# Patient Record
Sex: Female | Born: 1940
Health system: Southern US, Community
[De-identification: ages and names within clinical notes are randomized; demographics above are authoritative.]

## PROBLEM LIST (undated history)

## (undated) DIAGNOSIS — K208 Other esophagitis without bleeding: Secondary | ICD-10-CM

## (undated) DIAGNOSIS — C3490 Malignant neoplasm of unspecified part of unspecified bronchus or lung: Secondary | ICD-10-CM

## (undated) DIAGNOSIS — J449 Chronic obstructive pulmonary disease, unspecified: Secondary | ICD-10-CM

## (undated) DIAGNOSIS — J302 Other seasonal allergic rhinitis: Secondary | ICD-10-CM

## (undated) DIAGNOSIS — I10 Essential (primary) hypertension: Secondary | ICD-10-CM

## (undated) DIAGNOSIS — T66XXXA Radiation sickness, unspecified, initial encounter: Principal | ICD-10-CM

## (undated) DIAGNOSIS — M1990 Unspecified osteoarthritis, unspecified site: Secondary | ICD-10-CM

## (undated) HISTORY — PX: JOINT REPLACEMENT: SHX530

## (undated) HISTORY — PX: ABDOMINAL HYSTERECTOMY: SHX81

## (undated) HISTORY — PX: TOTAL HIP ARTHROPLASTY: SHX124

## (undated) HISTORY — PX: TOTAL KNEE ARTHROPLASTY: SHX125

## (undated) HISTORY — PX: BACK SURGERY: SHX140

## (undated) HISTORY — PX: APPENDECTOMY: SHX54

---

## 1998-12-29 ENCOUNTER — Encounter: Payer: Self-pay | Admitting: Emergency Medicine

## 1998-12-29 ENCOUNTER — Emergency Department (HOSPITAL_COMMUNITY): Admission: EM | Admit: 1998-12-29 | Discharge: 1998-12-29 | Payer: Self-pay | Admitting: Emergency Medicine

## 1999-10-21 ENCOUNTER — Encounter: Payer: Self-pay | Admitting: Family Medicine

## 1999-10-21 ENCOUNTER — Ambulatory Visit (HOSPITAL_COMMUNITY): Admission: RE | Admit: 1999-10-21 | Discharge: 1999-10-21 | Payer: Self-pay | Admitting: Family Medicine

## 1999-10-22 ENCOUNTER — Other Ambulatory Visit: Admission: RE | Admit: 1999-10-22 | Discharge: 1999-10-22 | Payer: Self-pay | Admitting: Family Medicine

## 2001-07-20 ENCOUNTER — Inpatient Hospital Stay (HOSPITAL_COMMUNITY): Admission: EM | Admit: 2001-07-20 | Discharge: 2001-07-22 | Payer: Self-pay

## 2001-07-21 ENCOUNTER — Encounter: Payer: Self-pay | Admitting: Cardiovascular Disease

## 2005-06-08 ENCOUNTER — Other Ambulatory Visit: Admission: RE | Admit: 2005-06-08 | Discharge: 2005-06-08 | Payer: Self-pay | Admitting: Family Medicine

## 2005-09-16 ENCOUNTER — Emergency Department (HOSPITAL_COMMUNITY): Admission: EM | Admit: 2005-09-16 | Discharge: 2005-09-16 | Payer: Self-pay | Admitting: Family Medicine

## 2007-02-02 ENCOUNTER — Encounter: Admission: RE | Admit: 2007-02-02 | Discharge: 2007-02-02 | Payer: Self-pay | Admitting: Orthopedic Surgery

## 2008-01-10 ENCOUNTER — Inpatient Hospital Stay (HOSPITAL_COMMUNITY): Admission: RE | Admit: 2008-01-10 | Discharge: 2008-01-14 | Payer: Self-pay | Admitting: Orthopedic Surgery

## 2009-06-15 IMAGING — CR DG CHEST 2V
2 series · 2 of 2 positions shown · non-contrast
Comparison: None

CLINICAL DATA: Preoperative respiratory exam.  Osteoarthritis of
the right knee

CHEST - 2 VIEW

[view not recorded (1 of 2)]
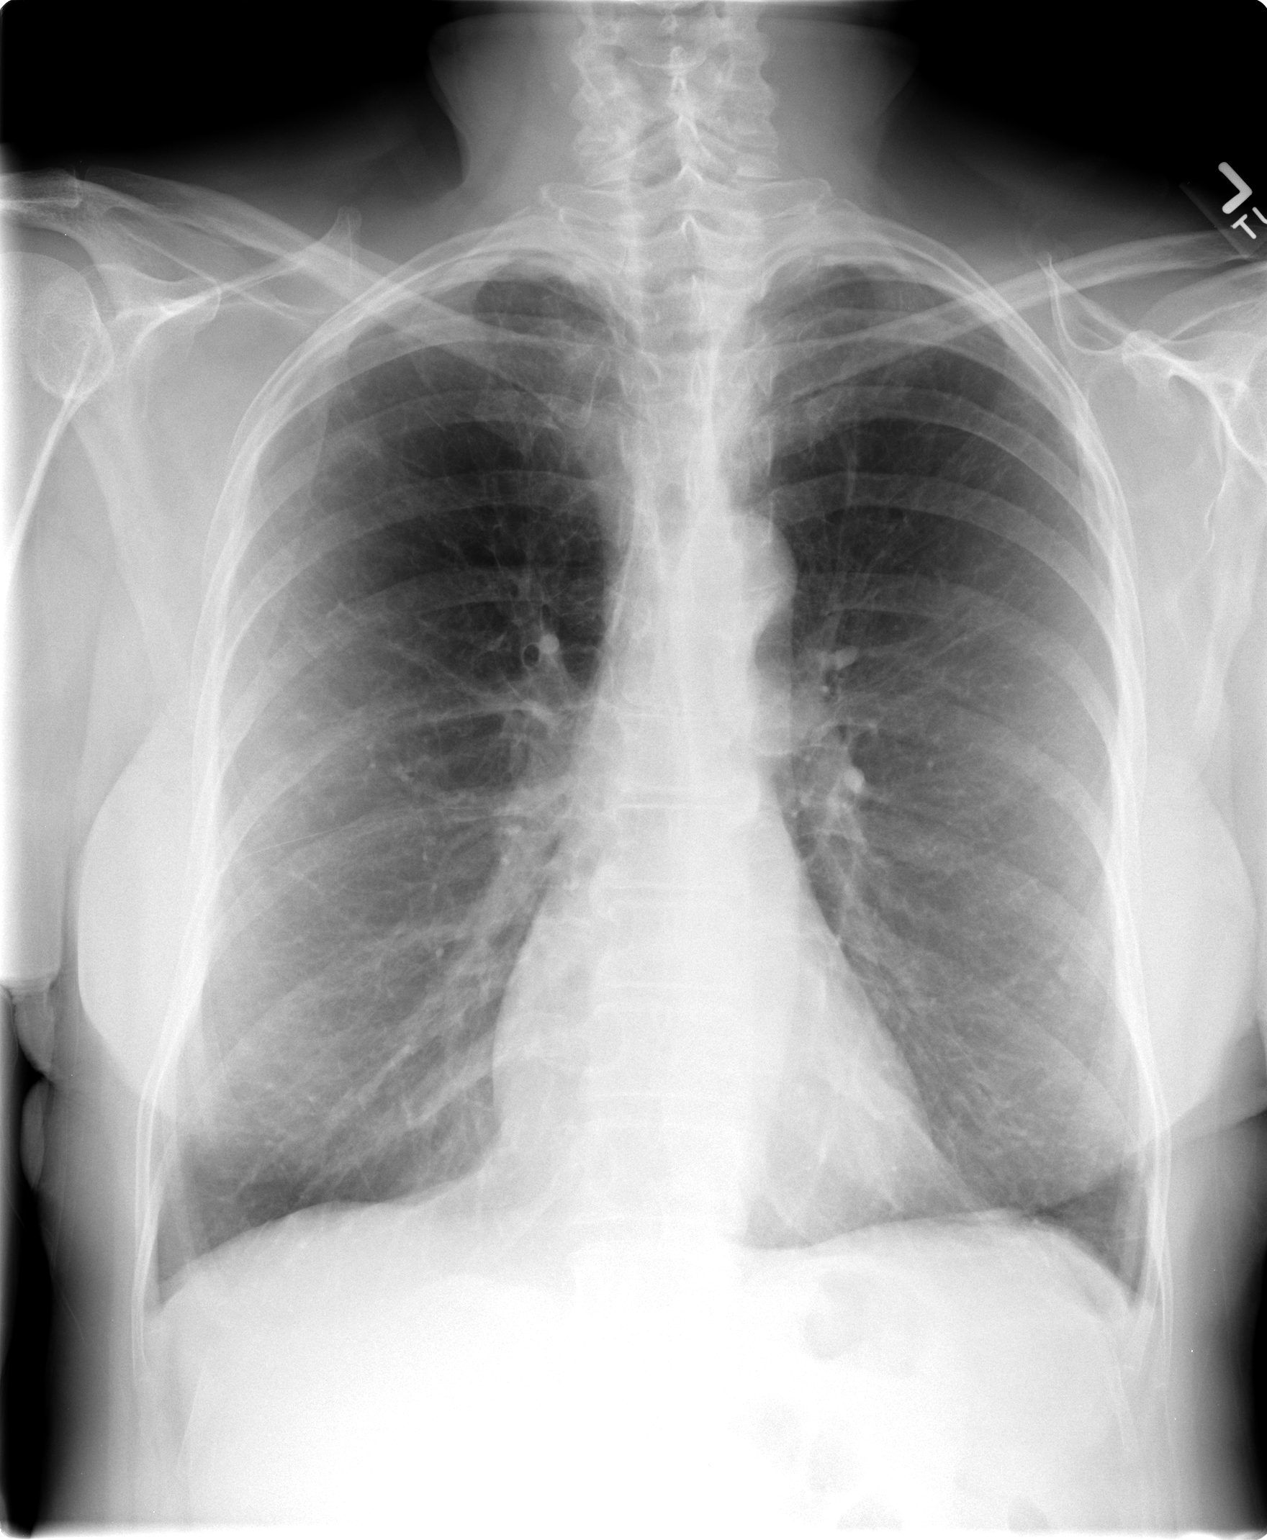

[view not recorded (2 of 2)]
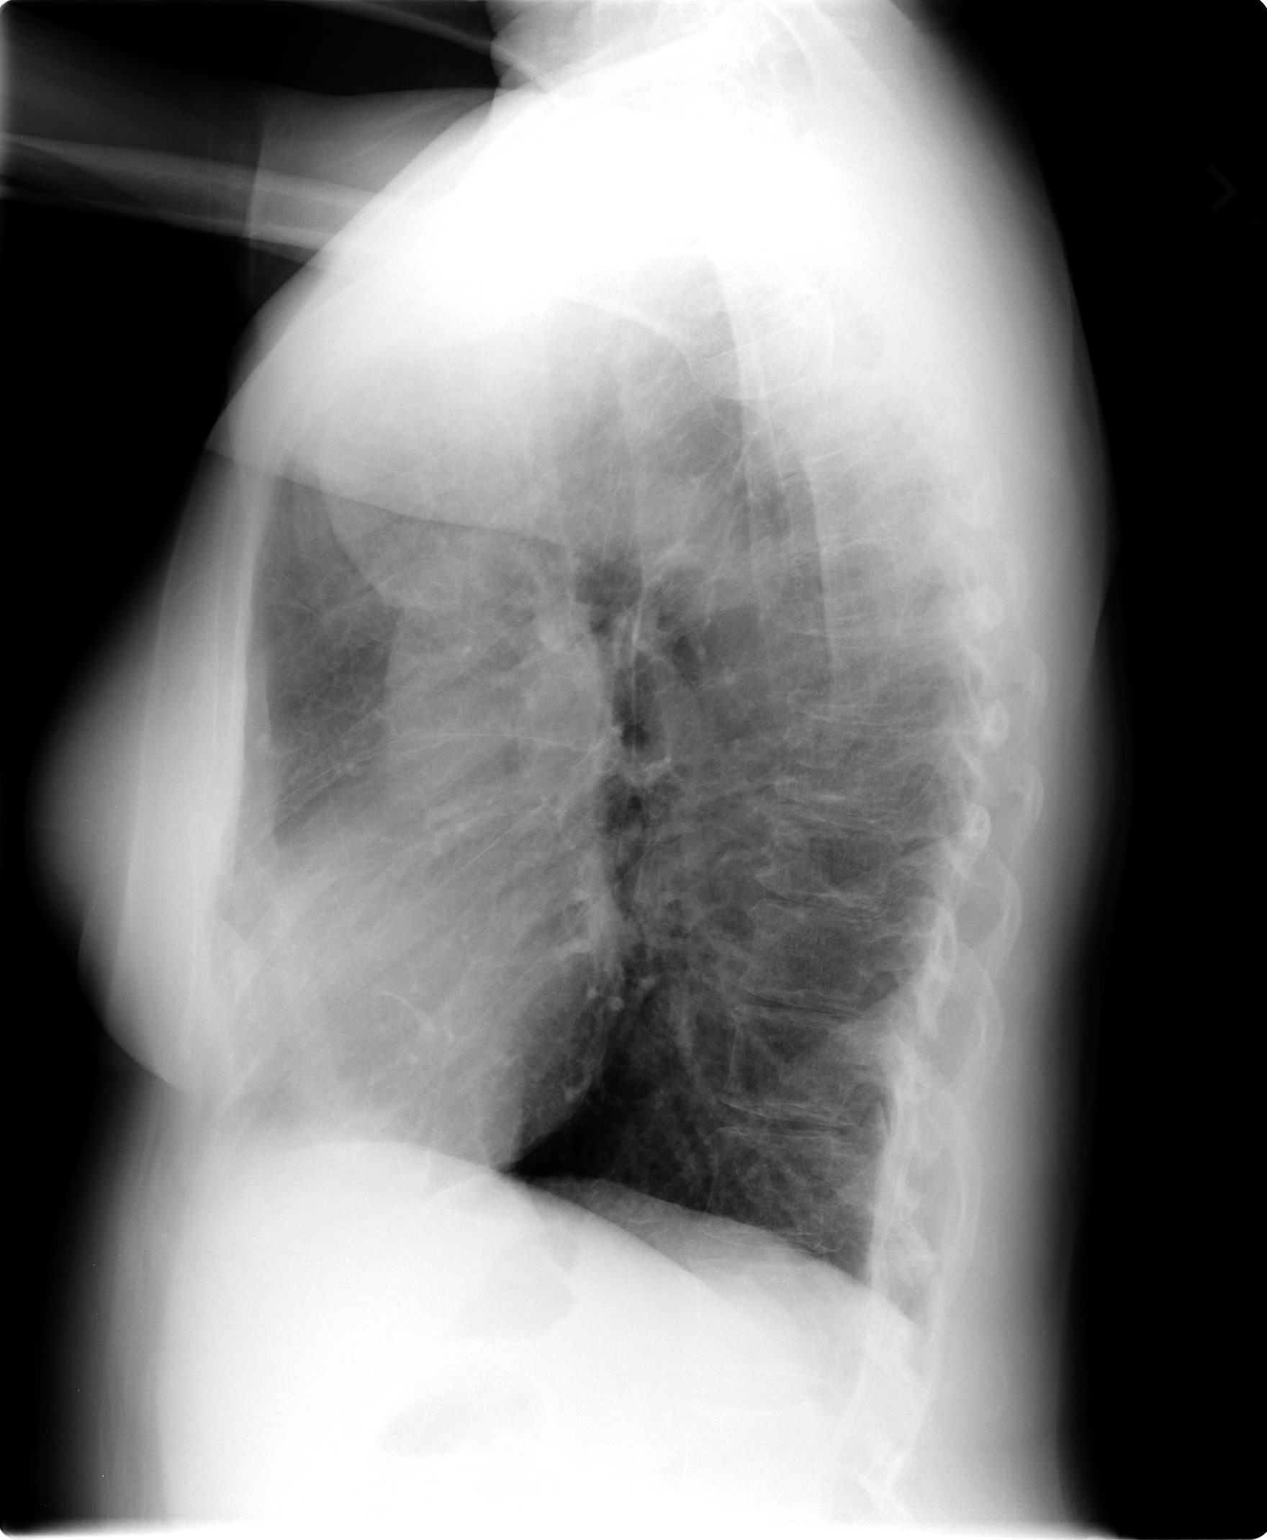

[2 of 2 positions shown; findings below may reference images not displayed]

FINDINGS: The heart size and vascularity are normal and the lungs are clear.
The lungs do appear somewhat hyperinflated.  No significant bony
abnormality.
IMPRESSION: No acute cardiopulmonary disease.

## 2009-06-17 IMAGING — CR DG KNEE 1-2V PORT*R*
2 series · 2 of 2 positions shown · non-contrast
Comparison: None

CLINICAL DATA: Knee replacement

PORTABLE RIGHT KNEE - 1-2 VIEW

[view not recorded (1 of 2)]
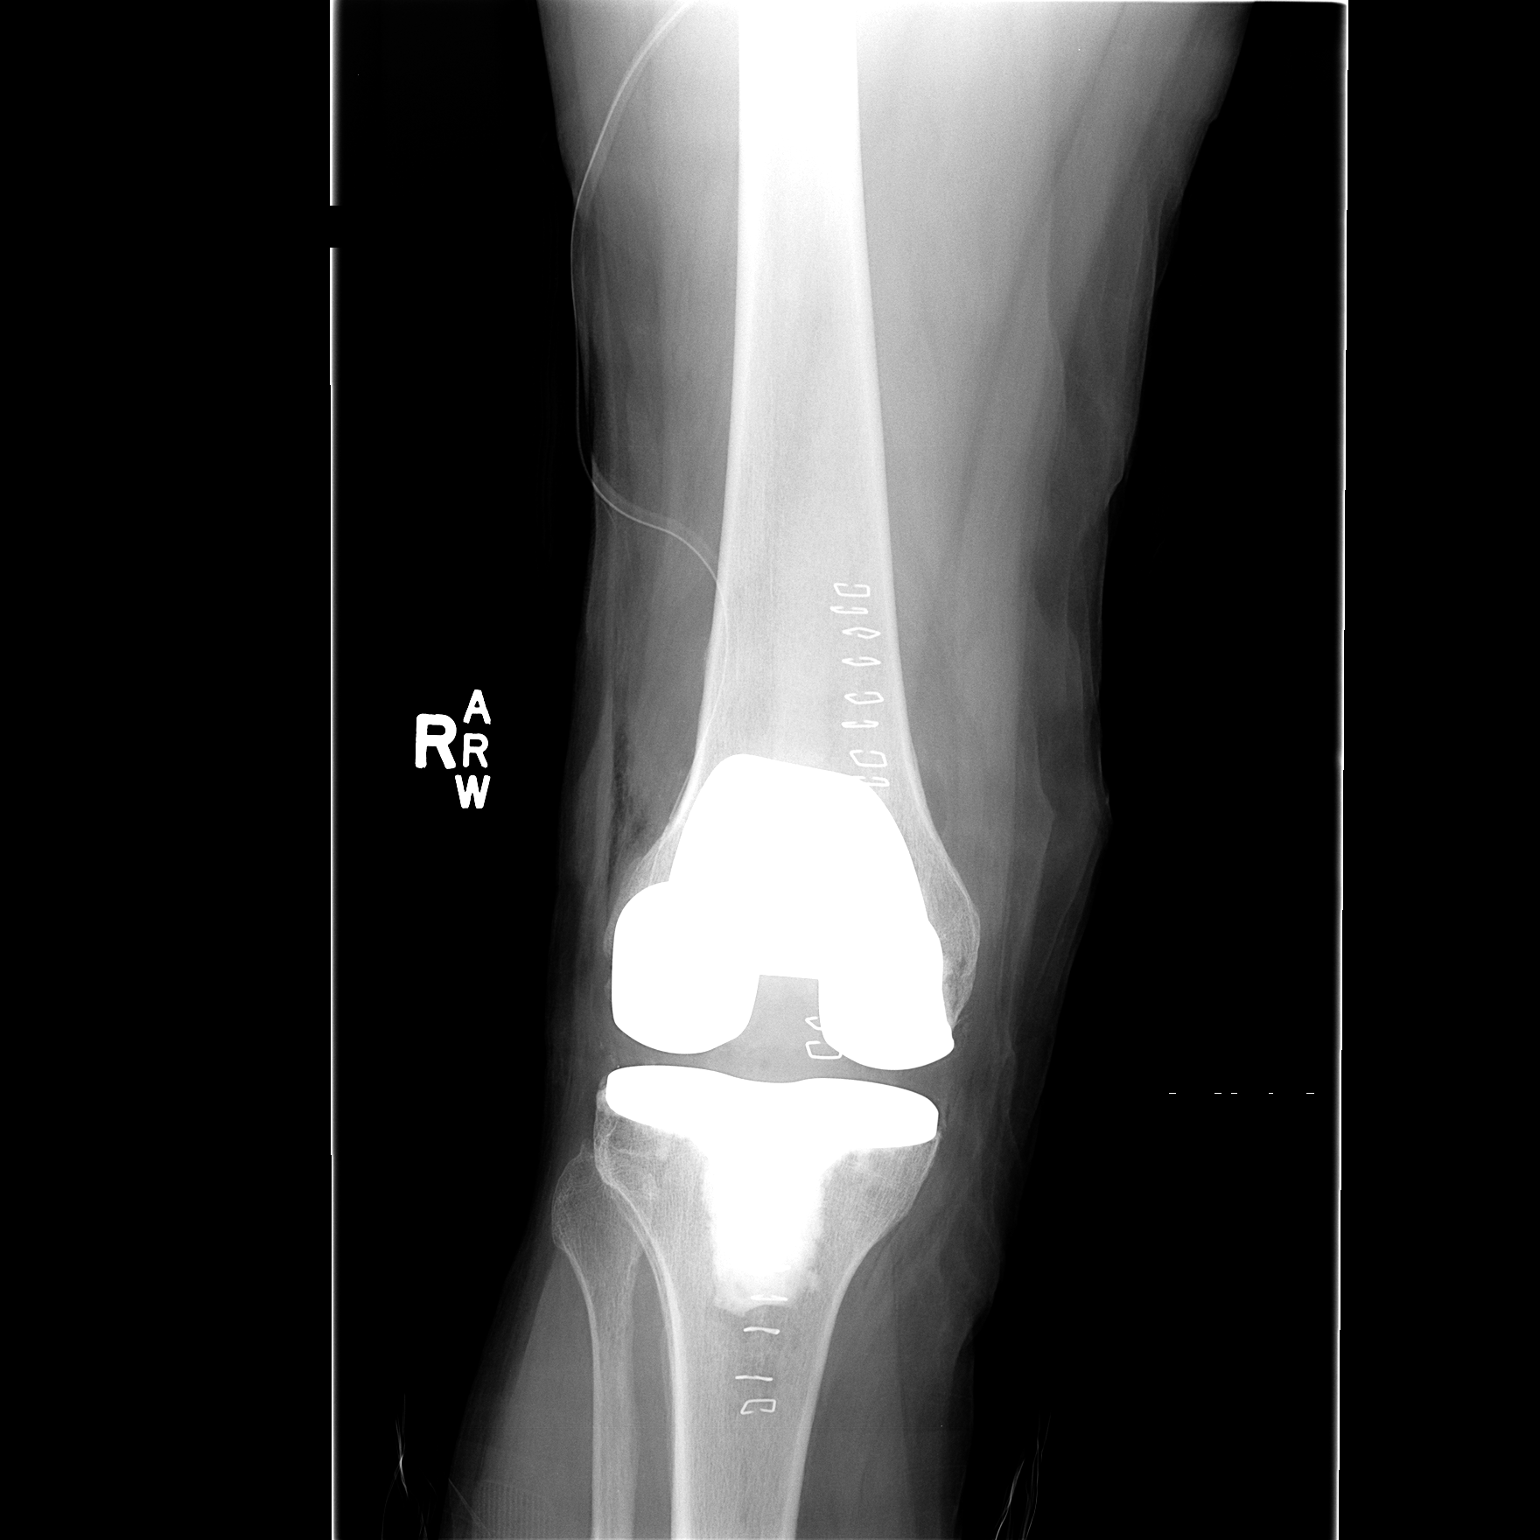

[view not recorded (2 of 2)]
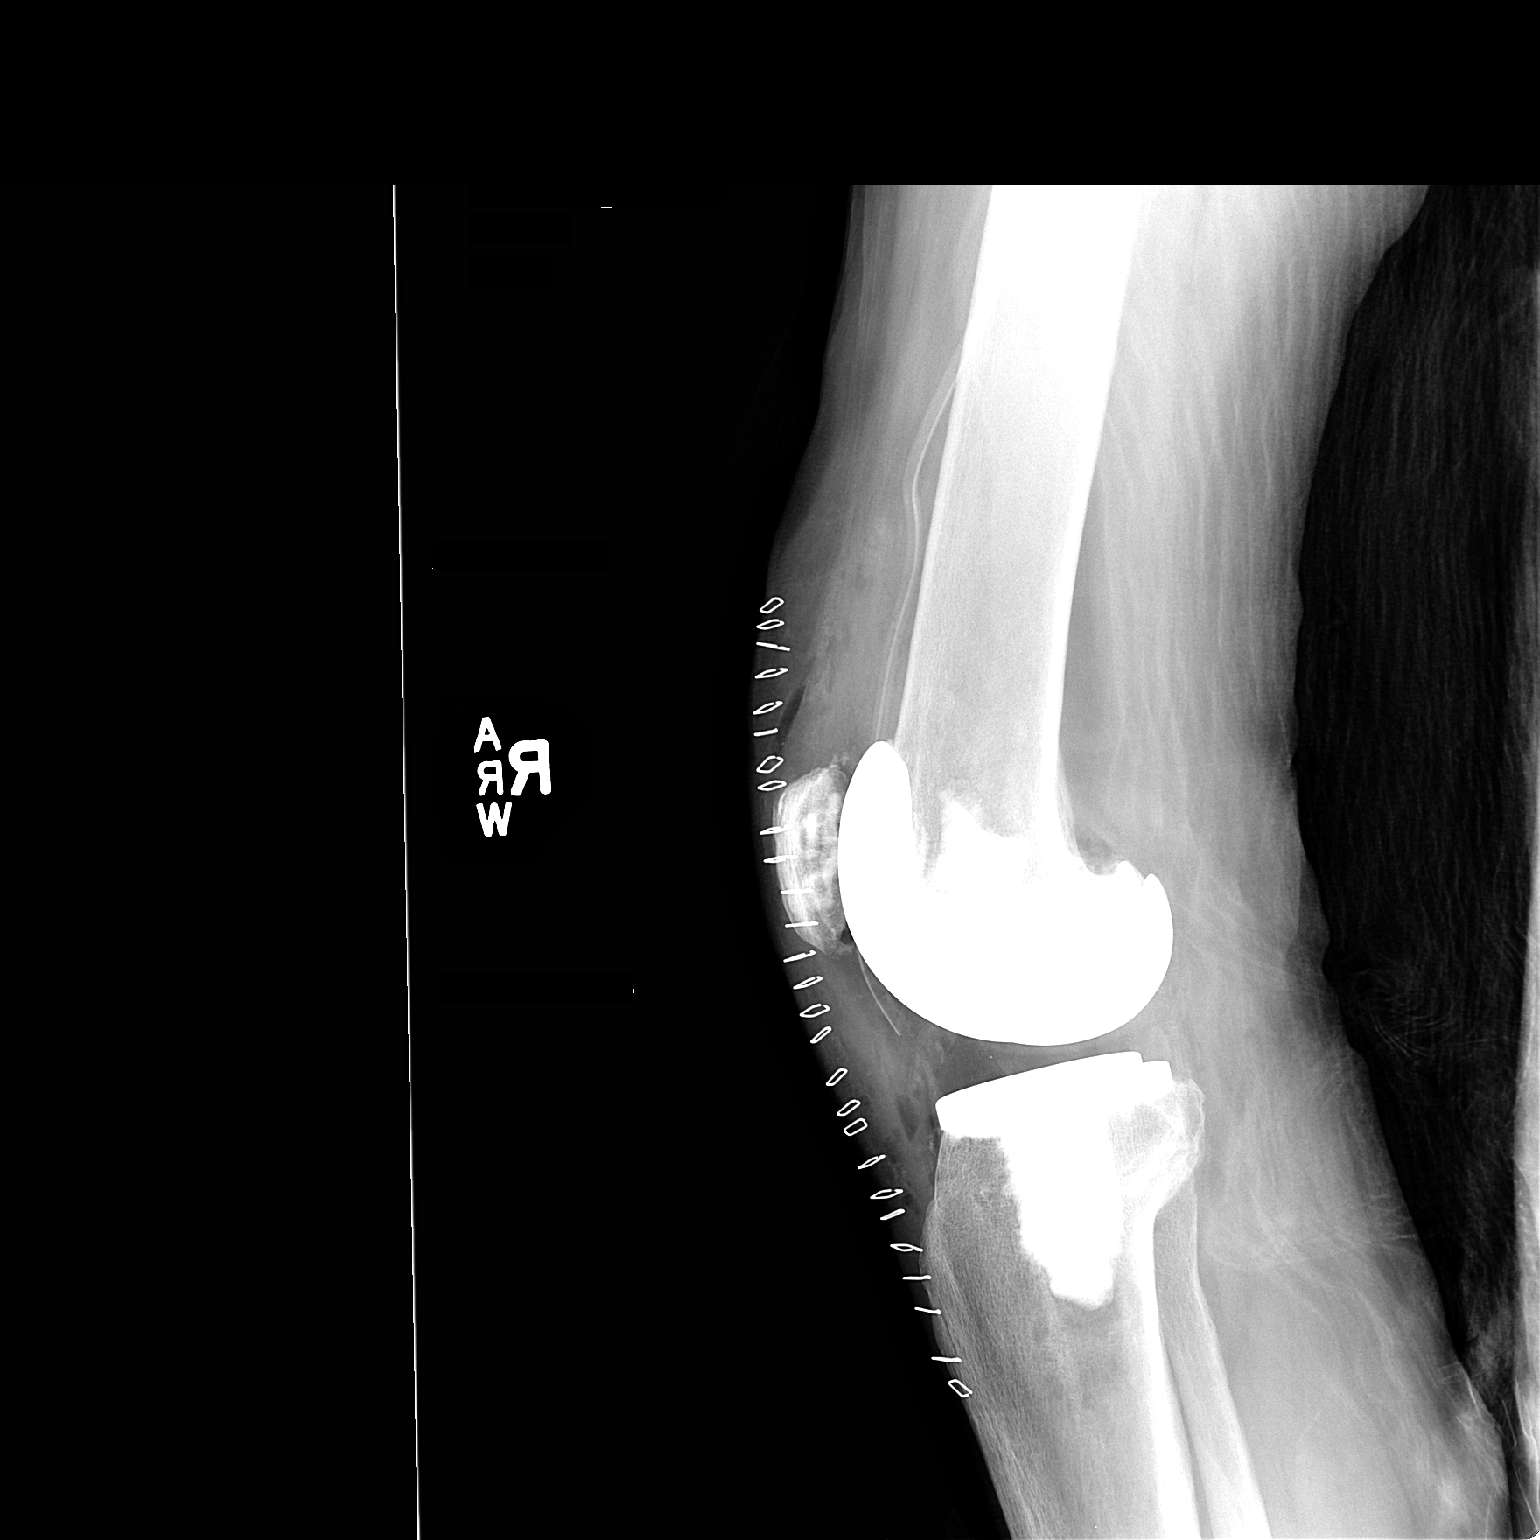

[2 of 2 positions shown; findings below may reference images not displayed]

FINDINGS: There is been a total knee replacement.  The prosthesis
is satisfactory alignment.  There is no fracture or complication.
There is a surgical drain in the soft tissues
IMPRESSION: Satisfactory knee replacement

## 2010-11-24 NOTE — H&P (Signed)
Kristy Salazar, Kristy Salazar                ACCOUNT NO.:  [id number]   MEDICAL RECORD NO.:  [id number]          PATIENT TYPE:  INP   LOCATION:  NA                           FACILITY:  [facility]   PHYSICIAN:  Georges Lynch. Gioffre, M.D.DATE OF BIRTH:  07/01/1941   DATE OF ADMISSION:  01/10/2008  DATE OF DISCHARGE:                              HISTORY & PHYSICAL   CHIEF COMPLAINT:  Painful range of motion right knee.   HISTORY OF PRESENT ILLNESS:  Ms. Kristy Salazar is a 70 year old female with  issues related to right knee pain.  The patient had significant  conservative treatment along with arthroscopic procedures to her right  knee.  She was found to have severe arthritic changes in her knee with  the arthroscopy.  The patient continued to have pain with range of  motion and weightbearing.  The patient has elected to proceed with a  right total knee arthroplasty.   ALLERGIES:  ERYTHROMYCIN.   CURRENT MEDICATIONS:  1. Singulair 1 tablet at night during allergy seasons.  2. Crestor.  3. Chantix.   PAST MEDICAL HISTORY:  1. Asthma with seasonal allergies.  2. Hypercholesterolemia.  3. Tobacco use; currently going through cessation treatment.   PAST SURGICAL HISTORY:  1. Appendectomy in 1960.  2. Back surgery in 1991/06/30.  3. Knee arthroscopy in 06/29/97 and 06/29/2006.  4. A hysterectomy in 1970 requiring a blood transfusion.  (The patient denies any complications with the above-mentioned surgical  procedures.)   REVIEW OF SYSTEMS:  NEUROLOGIC:  Negative for any neurologic issues.  PULMONARY:  She does have occasional seasonal allergies which are  treated with Singulair mostly in the spring and fall.  CARDIOVASCULAR:  Negative for any chest pains, MIs.  She does have some slightly  elevated cholesterol, for which she is taking Crestor.  GI:  Unremarkable.  GU:  Unremarkable.  ENDOCRINE:  Unremarkable.  HEMATOLOGIC:  She did have a blood transfusion with a previous  hysterectomy.   FAMILY MEDICAL HISTORY:   Father is deceased in 06/30/03 at the age of 63  from heart disease.  Mother is deceased in 27 at the age of 84 from  cancer.   SOCIAL HISTORY:  The patient is single.  She works as a Glass blower/designer.  She  smoked about a half a pack a day for the past 30 years.  She has five  grown children.  Her family will care for after the surgery.   PHYSICAL EXAMINATION:  VITAL SIGNS:  Height is 5' 10, weight is 150  pounds.  Blood pressure is 128/68, pulse of 70 and regular, respirations  are 12.  The patient is afebrile.  GENERAL:  This is a slender, tall, healthy-appearing female, conscious,  alert and appropriate.  HEENT:  Head was normocephalic.  Pupils equal, round and reactive.  Gross hearing is intact.  NECK:  Supple.  No palpable lymphadenopathy.  Good range of motion.  CHEST:  Lung sounds were clear and equal throughout.  HEART:  Regular rate and rhythm.  ABDOMEN:  Soft, nontender.  Bowel sounds present.  EXTREMITIES:  Upper extremities had excellent range  of motion of her  shoulders, elbows and wrists.  Lower Extremities - both hips had full  range of motion without any discomfort.  Right knee had painful range of  motion which was about 5 degrees to 120 degrees.  Left knee was full  extension to 130 degrees.  She had no instability about either knee.  No  effusion.  No signs of infection.  Both calves were soft.  Good range of  motion of both ankles.  Peripheral vascular - carotid pulses were 2+,  radial pulses 2+, dorsalis pedis pulses were 1+.  She had no lower  extremity edema, venous stasis change or pigmentation changes.  NEUROLOGIC:  The patient was conscious, alert and appropriate.  Appeared  to be a good historian.  No gross neurologic defects noted.  BREAST/RECTAL/GU:  Deferred at this time.   PRIMARY CARE PHYSICIAN:  [name], M.D.   The patient was cleared through [facility] by Dr.  Lynnea Ferrier.   IMPRESSION:  1. Severe osteoarthritis right knee.  2.  Seasonal allergies with asthma.  3. Hypercholesterolemia.  4. Tobacco use.   PLAN:  The patient will undergo all routine labs and tests prior to  having a right total knee arthroplasty by Dr. Darrelyn Hillock at [facility] on January 10, 2008.      Jamelle Rushing, P.A.    ______________________________  Georges Lynch Darrelyn Hillock, M.D.    RWK/MEDQ  D:  01/09/2008  T:  01/09/2008  Job:  [id number]

## 2010-11-24 NOTE — Op Note (Signed)
Kristy Salazar, Kristy Salazar NO.:  [id number]   MEDICAL RECORD NO.:  [id number]          PATIENT TYPE:  INP   LOCATION:  0006                         FACILITY:  [facility]   PHYSICIAN:  Georges Lynch. Gioffre, M.D.DATE OF BIRTH:  [birth year]   DATE OF PROCEDURE:  01/10/2008  DATE OF DISCHARGE:                               OPERATIVE REPORT   SURGEON:  Georges Lynch. Darrelyn Hillock, M.D.   ASSISTANT:  Jamelle Rushing, P.A.   PREOPERATIVE DIAGNOSIS:  Severe degenerative arthritis of the right  knee.   POSTOPERATIVE DIAGNOSIS:  Severe degenerative arthritis of the right  knee.   OPERATION:  Right total knee arthroplasty utilizing the DePuy system.  I  cemented all three components in simultaneously and utilized a rotating  platform tibial insert.  The size used was the femur was a size 4 right,  posterior cruciate sacrificing.  Tibial insert was a size 4, 10 mm  thickness rotating platform.  The tibial tray was a size 3.  The patella  was a size 38 patella.  We utilized vancomycin in the bone cement.   PROCEDURE:  Under general anesthesia, routine orthopedic prep and  draping of the right lower extremity was carried out.  She had 1 g of IV  Ancef.  At this time, the leg was exsanguinated with an Esmarch.  Tourniquet was elevated at 350 mmHg.  The knee was flexed.  An anterior  incision was made over the anterior aspect of the right knee.  Bleeders  were identified and cauterized.  Two flaps were created.  I then did a  median parapatellar incision, reflected the patella laterally, flexed  the knee, did medial and lateral meniscectomies, and excised the  anterior and posterior cruciate ligaments.  We then made our initial  drill hole in the intercondylar notch, and removed 11 mm thickness off  the distal femur.  We measured the femur to be a size 4 right.  We then  utilized our next jig and did our anterior, posterior, and chamfer cuts  for a size 4 right femur.  Following that, we then went  on and prepared  our tibia in the usual fashion, removed 4 mm thickness off the affected  side of the tibia.  We cut our keel cut out of the tibia.  We then went  on and cut our notch cut out of the femur.  We inserted our spacer  blocks and noted that we were a little tight in extension, so we went  back and made another cut in the distal femur.  We removed a few more  millimeters in the usual fashion.  Once that was done, we had an  excellent fit with our trial component.  We then cut our patella.  We  did a resurfacing procedure.  Three drill holes were made in the  articular surface of the patella.  We removed all trial components,  thoroughly Waterpiked out the knee, dried the knee out, and then  inserted our Gelfoam up into the femoral shaft and down into the tibial  shaft.  The cement then was  inserted.  We added vancomycin to the  cement, and all three components were cemented in simultaneously.  Following that, we then selected a size 4, 10 mm thickness tibial  insert.  We made sure first that all loose pieces of cement were  removed.  We Waterpiked the knee out again and made sure there were no  loose pieces of cement.  We inserted our permanent size 4 tibial insert.  The knee was then reduced, taken through motion.  We had excellent  flexion and extension,  good lateral and medial stability.  The knee then was closed in layers  in the usual fashion over a Hemovac drain.  Note that in doing this,  though, prior to inserting the components, we did inject 30 mL of 0.25%  Marcaine with epinephrine and 30 mg of Toradol.  Sterile Neosporin  dressing was applied.           ______________________________  Georges Lynch Darrelyn Hillock, M.D.     RAG/MEDQ  D:  01/10/2008  T:  01/10/2008  Job:  [id number]   cc:   Ritta Slot, MD  Fax: [phone]

## 2010-11-27 NOTE — Discharge Summary (Signed)
Kristy Salazar, Kristy Salazar NO.:  [id number]   MEDICAL RECORD NO.:  [id number]          PATIENT TYPE:  INP   LOCATION:  1610                         FACILITY:  [facility]   PHYSICIAN:  Georges Lynch. Gioffre, M.D.DATE OF BIRTH:  December 24, 1940   DATE OF ADMISSION:  01/10/2008  DATE OF DISCHARGE:  01/14/2008                               DISCHARGE SUMMARY   ADMISSION DIAGNOSES:  1. Severe osteoarthritis right knee.  2. Seasonal allergies, with asthma.  3. Hypercholesterolemia.  4. Tobacco use.   DISCHARGE DIAGNOSES:  1. Right total knee arthroplasty.  2. Postoperative blood loss anemia, requiring 2 units of packed red      blood cells, with no untoward events.  3. Seasonal allergies, with asthma.  4. Hypercholesterolemia.  5. Tobacco use.   HISTORY OF PRESENT ILLNESS:  The patient is a 70 year old female with  pain and difficulty with range of motion to her right knee.  She has  failed conservative treatment, including arthroscopic procedures,  medications, and injections.  The patient has elected to proceed with  total knee arthroplasty.  The patient's arthroscopic picture shows  significant arthritic changes deep within the knee.   ALLERGIES:  ERYTHROMYCIN.   CURRENT MEDICATIONS:  1. Singulair 1 tablet at night in allergy season.  2. Crestor  3. Chantix.   SURGICAL PROCEDURE:  On January 10, 2008, the patient was taken to the O.R.  by Dr. Ranee Gosselin, assisted by Arlyn Leak, PA-C, and under general  anesthesia underwent a right total knee arthroplasty with a __________  without any complications.  The patient tolerated the procedure well.  There was minimal blood loss.  The patient had the following components  implanted:  A size 4 right femoral Sigma component, a size 3 keel tibial  tray, a size 4, 10 mm thickness polyethylene bearing, a signs 38, 3 peg  patella.  All components were implanted following methacrylate with  vancomycin.   CONSULTS:  The following  routine consults were requested:  physical  therapy, case management, and pharmacy.   HOSPITAL COURSE:  On January 10, 2008, the patient was admitted to [facility] under the care of Dr. Ranee Gosselin.  The patient was  taken the O.R. and had a right total knee arthroplasty without  complications.  The patient was transferred to the recovery room with IV  antibiotics, pain medicines, and DVT prophylaxis.  The patient was  started on the total knee protocol.  Postoperatively, the patient did  develop some postoperative blood loss anemia.  Hemoglobin did drop to  8.6.  Orthopedic coverage recommended blood transfusions.  The patient  received 2 units of packed red blood cells.  The patient had a little  bit of a temperature after completing it, but no other signs of  reaction.  The patient had no other complicating events.  The patient's  hemoglobin improved to 11.  The patient's wound remained benign, without  any signs of infection.  The patient was able to transition off of IV  medications to p.o. medications well.  The patient worked well with  physical therapy.  It was felt on postop day #4 that the patient was  orthopedically and medically stable and ready for discharge home.  Arrangements were made for her to do outpatient physical therapy with  home health R.N. and physical therapist.  The patient was discharged in  good condition.   LABORATORIES:  CBC on admission found WBC 6.4, hemoglobin 14.6,  hematocrit 42.4, platelets 325.  Second postop day, hemoglobin dropped  to 8.6, with a hematocrit of 24.5.  She was typed, crossed, and  transfused 2 units of packed red blood cells, with improvement of her  hemoglobin and hematocrit to 11.2 and 32.5.  The patient's INR on  discharge was 2.1, with pharmacy managing her Coumadin.  The patient's  chemistries on admission found sodium of 143, potassium of 4.8, dropped  to 3.9 during her hospitalization, glucose 110, elevated to 128,  with no  change to medications, BUN was 4, with a creatinine of 0.7 on discharge.  Estimated GFR was greater than 60.  The patient received a total of 2  units of packed red blood cells.   DISCHARGE INSTRUCTIONS:   DIET:  No restrictions.   ACTIVITY:  The patient is to increase activity and walk with the  assistance of a walker.   WOUND CARE:  The patient should have her wound check daily.  She is to  keep it clean and dry.  Change her dressing daily.   FOLLOWUP:  With Dr. Darrelyn Hillock in 2 weeks from date of surgery.  The  patient to call [phone] for that follow-up appointment.  Home health physical therapy through [place], and to manage Coumadin.   MEDICATIONS:  1. Percocet 10/650, 1 q.4-6 h. for pain if needed.  2. Robaxin 500 mg 1 tablet q.6 h. for muscle spasms if needed.  3. Coumadin 5 mg once a day, unless changed by [facility] pharmacist.  4. Chantix 0.5 mg, once in the morning and once in the evening.  5. Crestor 10 mg a day.  6. Metformin 2 tablets every evening.   CONDITION UPON DISCHARGE HOME:  Improved and good.      Jamelle Rushing, P.A.    ______________________________  Georges Lynch Darrelyn Hillock, M.D.    RWK/MEDQ  D:  01/31/2008  T:  01/31/2008  Job:  [id number]

## 2010-11-27 NOTE — Discharge Summary (Signed)
[place]  Patient:    Kristy Salazar, Kristy Salazar Visit Number: [id number] MRN: [id number]          Service Type: MED Location: [phone] Attending Physician:  Beverely Low Dictated by:   Geraldo Pitter, M.D. Admit Date:  07/20/2001 Discharge Date: 07/22/2001                             Discharge Summary  HISTORY OF PRESENT ILLNESS:  The patient is a 70 year old female who presented with chest pain.  She was seen in the emergency room by Dr. Wallace Cullens.  Her EKG was normal sinus rhythm.  Her CPK was 218, MB of 2.3, ______ 0.1, troponin-I 1 and 0.01.  Her CPK was somewhat elevated, but no ______.  Throughout the hospitalization for a cardiac evaluation as appropriate.  She was admitted to the hospital.  We did get her a consultation by cardiology, who saw the patient, and they thought that she had chest pain, but that we should rule out myocardial infarction.  She was found to have a right carotid bruit by Dr. Ermalinda Memos also on examination.  A 2-D echocardiogram was done which showed overall left ventricular systolic function was normal, left ventricular ejection fraction was estimated as being between 50 to 55%, left ventricular wall thickness was in the upper limits of normal.  Aortic valve thickness was mildly increased.  Mitral valve was thickened, the anterior more than the posterior leaflets.  There was trivial mitral valve regurgitation.  Left ventricular size was normal.  Tricuspid valve was normal.  Overall systolic function was normal.  The aortic valve thickness was deemed mild.  Her cardiac enzymes decreased down to a CPK of 132, and CK-MB of 1.2, CPK of 127, and CK-MB of 1.3.  She continue to do well.  It should be noted that she did also have increased ______ 205.  She was felt not to have any cardiac problems, and was felt really to have more of a costochondritis type of event.  It was felt that she had reached maximal benefit from this  hospitalization, and discharge was instituted.  She was discharged from the hospital ______ chest pain, atypical probable costochondritis with mild elevation of right carotid bruit which will be followed.  She will be started on an aspirin for that.  DISCHARGE MEDICATIONS:  Anti-inflammatory medications.  FOLLOWUP:  She will be seen in the office in a one week period of time.Dictated by:   Geraldo Pitter, M.D. Attending Physician:  Beverely Low DD:  08/31/01 TD:  09/01/01 Job: [id number]

## 2011-04-08 LAB — URINALYSIS, ROUTINE W REFLEX MICROSCOPIC
Glucose, UA: NEGATIVE
Glucose, UA: NEGATIVE
Hgb urine dipstick: NEGATIVE
Ketones, ur: NEGATIVE
Ketones, ur: NEGATIVE
Leukocytes, UA: NEGATIVE
Protein, ur: NEGATIVE
pH: 6
pH: 8

## 2011-04-08 LAB — DIFFERENTIAL
Basophils Absolute: 0
Basophils Relative: 1
Lymphocytes Relative: 45
Neutro Abs: 3
Neutrophils Relative %: 47

## 2011-04-08 LAB — TRANSFUSION REACTION: DAT C3: NEGATIVE

## 2011-04-08 LAB — CBC
HCT: 42.4
Hemoglobin: 14.6
MCV: 92.4
Platelets: 325
Platelets: 196
RDW: 12.7
WBC: 6.4
WBC: 11.3 — ABNORMAL HIGH

## 2011-04-08 LAB — BASIC METABOLIC PANEL
BUN: 4 — ABNORMAL LOW
Creatinine, Ser: 0.77
GFR calc non Af Amer: >60
Potassium: 3.9

## 2011-04-08 LAB — COMPREHENSIVE METABOLIC PANEL
Alkaline Phosphatase: 58
BUN: 7
Chloride: 105
Creatinine, Ser: 0.84
Glucose, Bld: 110 — ABNORMAL HIGH
Potassium: 4.8
Total Bilirubin: 0.5

## 2011-04-08 LAB — CROSSMATCH

## 2011-04-08 LAB — PROTIME-INR
INR: 0.9
INR: 1
Prothrombin Time: 12.3
Prothrombin Time: 13.7
Prothrombin Time: 24.2 — ABNORMAL HIGH
Prothrombin Time: 24.5 — ABNORMAL HIGH
Prothrombin Time: 28 — ABNORMAL HIGH

## 2011-04-08 LAB — HEMOGLOBIN AND HEMATOCRIT, BLOOD
HCT: 24.5 — ABNORMAL LOW
Hemoglobin: 8.6 — ABNORMAL LOW

## 2011-04-08 LAB — APTT
aPTT: 43 — ABNORMAL HIGH
aPTT: 68 — ABNORMAL HIGH

## 2011-04-08 LAB — TYPE AND SCREEN: ABO/RH(D): A POS

## 2011-05-10 ENCOUNTER — Other Ambulatory Visit: Payer: Self-pay | Admitting: Orthopedic Surgery

## 2011-05-10 ENCOUNTER — Other Ambulatory Visit (HOSPITAL_COMMUNITY): Payer: Self-pay | Admitting: Orthopedic Surgery

## 2011-05-10 ENCOUNTER — Ambulatory Visit (HOSPITAL_COMMUNITY)
Admission: RE | Admit: 2011-05-10 | Discharge: 2011-05-10 | Disposition: A | Discharge status: Home / Self Care | Payer: BC Managed Care – PPO | Source: Ambulatory Visit | Attending: Orthopedic Surgery | Admitting: Orthopedic Surgery

## 2011-05-10 ENCOUNTER — Encounter (HOSPITAL_COMMUNITY): Payer: BC Managed Care – PPO

## 2011-05-10 DIAGNOSIS — Z01818 Encounter for other preprocedural examination: Secondary | ICD-10-CM

## 2011-05-10 DIAGNOSIS — I10 Essential (primary) hypertension: Secondary | ICD-10-CM | POA: Insufficient documentation

## 2011-05-10 DIAGNOSIS — Z01812 Encounter for preprocedural laboratory examination: Secondary | ICD-10-CM | POA: Insufficient documentation

## 2011-05-10 LAB — URINALYSIS, ROUTINE W REFLEX MICROSCOPIC
Bilirubin Urine: NEGATIVE
Leukocytes, UA: NEGATIVE
Nitrite: NEGATIVE
Specific Gravity, Urine: 1.008 (ref 1.005–1.030)
Urobilinogen, UA: 0.2 mg/dL (ref 0.0–1.0)
pH: 7.5 (ref 5.0–8.0)

## 2011-05-10 LAB — CBC
HCT: 40.6 % (ref 36.0–46.0)
Hemoglobin: 14 g/dL (ref 12.0–15.0)
MCV: 92.1 fL (ref 78.0–100.0)
RBC: 4.41 MIL/uL (ref 3.87–5.11)
RDW: 12.4 % (ref 11.5–15.5)
WBC: 5.2 10*3/uL (ref 4.0–10.5)

## 2011-05-10 LAB — APTT: aPTT: 41 seconds — ABNORMAL HIGH (ref 24–37)

## 2011-05-10 LAB — COMPREHENSIVE METABOLIC PANEL
ALT: 22 U/L (ref 0–35)
AST: 18 U/L (ref 0–37)
CO2: 24 mEq/L (ref 19–32)
Chloride: 103 mEq/L (ref 96–112)
Creatinine, Ser: 0.63 mg/dL (ref 0.50–1.10)
GFR calc Af Amer: >90 mL/min (ref >90)
GFR calc non Af Amer: >90 mL/min (ref >90)
Glucose, Bld: 110 mg/dL — ABNORMAL HIGH (ref 70–99)
Total Bilirubin: 0.2 mg/dL — ABNORMAL LOW (ref 0.3–1.2)

## 2011-05-10 LAB — SURGICAL PCR SCREEN: Staphylococcus aureus: NEGATIVE

## 2011-05-10 LAB — DIFFERENTIAL
Basophils Absolute: 0 10*3/uL (ref 0.0–0.1)
Lymphocytes Relative: 49 % — ABNORMAL HIGH (ref 12–46)
Lymphs Abs: 2.5 10*3/uL (ref 0.7–4.0)
Neutro Abs: 2.3 10*3/uL (ref 1.7–7.7)
Neutrophils Relative %: 44 % (ref 43–77)

## 2011-05-10 LAB — URINE MICROSCOPIC-ADD ON

## 2011-05-10 LAB — PROTIME-INR: INR: 0.94 (ref 0.00–1.49)

## 2011-05-11 NOTE — H&P (Signed)
Kristy Salazar, Kristy Salazar NO.:  [id number]  MEDICAL RECORD NO.:  [id number]  LOCATION:  1S                           FACILITY:  [facility]  PHYSICIAN:  Georges Lynch. Spiros Greenfeld, M.D.DATE OF BIRTH:  [birth year]  DATE OF ADMISSION:  04/13/2011 DATE OF DISCHARGE:                             HISTORY & PHYSICAL   CHIEF COMPLAINT:  Left hip pain.  HISTORY OF PRESENT ILLNESS:  The patient is a 70 year old female with worsening hip pain secondary to end-stage osteoarthritis on the left side.  The patient had a left total hip arthroplasty by Dr. Ranee Gosselin to decrease pain and increase function.  PAST MEDICAL HISTORY:  Hypertension and diabetes.  FAMILY MEDICAL HISTORY:  Coronary artery disease and hypertension.  SOCIAL HISTORY:  The patient of Dr. [name].  Smokes half pack a day and does not smoke.  DRUG ALLERGY:  To ERYTHROMYCIN.  CURRENT MEDICATIONS:  Metformin, Niaspan, Crestor, Exforge, aspirin, and Chantix with unknown dosages.  REVIEW OF SYSTEMS:  Shows pain on range of motion of the left hip, pain with ambulation.  Otherwise review of systems negative.  PHYSICAL EXAM:  VITAL SIGNS:  Pulse 72, respirations 16, blood pressure 132/76. GENERAL:  The patient is a healthy-appearing 70 year old female, in no acute distress.  Pleasant mood and affect.  Alert and oriented x3. HEENT:  Examination of head and neck shows cranial nerves II through XII grossly intact. NECK:  She has full range of motion and tenderness of the cervical spine. CHEST:  Active breath sounds bilaterally.  No wheezes, rhonchi, or rales. HEART:  Regular rate and rhythm.  No murmur. ABDOMEN:  Nontender, nondistended, with active bowel sounds. EXTREMITIES:  Moderate tenderness with internal and external rotation. Some moderate stiffness of the left hip.  Neurovascularly she is intact distally.  No pedal edema, no rashes, and neurovascularly she is intact. Her bilateral upper and lower  extremities, and deep tendon reflexes are 2+ and equal bilaterally.  X-RAYS:  Show end-stage osteoarthritis, left hip.  IMPRESSION:  End-stage osteoarthritis, left hip.  PLAN OF ACTION:  Left total hip arthroplasty by Dr. Ranee Gosselin.     Thomas B. Dixon, P.A.   ______________________________ Georges Lynch Darrelyn Hillock, M.D.    TBD/MEDQ  D:  05/05/2011  T:  05/05/2011  Job:  [id number]  Electronically Signed by Standley Dakins P.A. on 05/11/2011 11:40:58 AM Electronically Signed by Ranee Gosselin M.D. on 05/11/2011 01:22:35 PM

## 2011-05-13 ENCOUNTER — Inpatient Hospital Stay (HOSPITAL_COMMUNITY)
Admission: RE | Admit: 2011-05-13 | Discharge: 2011-05-17 | DRG: 818 | Disposition: A | Discharge status: Home Health | Payer: BC Managed Care – PPO | Source: Ambulatory Visit | Attending: Orthopedic Surgery | Admitting: Orthopedic Surgery

## 2011-05-13 ENCOUNTER — Inpatient Hospital Stay (HOSPITAL_COMMUNITY): Payer: BC Managed Care – PPO

## 2011-05-13 DIAGNOSIS — M169 Osteoarthritis of hip, unspecified: Principal | ICD-10-CM | POA: Diagnosis present

## 2011-05-13 DIAGNOSIS — M1610 Unilateral primary osteoarthritis, unspecified hip: Principal | ICD-10-CM | POA: Diagnosis present

## 2011-05-13 DIAGNOSIS — E119 Type 2 diabetes mellitus without complications: Secondary | ICD-10-CM | POA: Diagnosis present

## 2011-05-13 DIAGNOSIS — I10 Essential (primary) hypertension: Secondary | ICD-10-CM | POA: Diagnosis present

## 2011-05-13 LAB — GLUCOSE, CAPILLARY
Glucose-Capillary: 110 mg/dL — ABNORMAL HIGH (ref 70–99)
Glucose-Capillary: 129 mg/dL — ABNORMAL HIGH (ref 70–99)

## 2011-05-13 LAB — TYPE AND SCREEN

## 2011-05-14 LAB — GLUCOSE, CAPILLARY
Glucose-Capillary: 98 mg/dL (ref 70–99)
Glucose-Capillary: 110 mg/dL — ABNORMAL HIGH (ref 70–99)
Glucose-Capillary: 129 mg/dL — ABNORMAL HIGH (ref 70–99)

## 2011-05-14 LAB — PROTIME-INR
INR: 1.02 (ref 0.00–1.49)
Prothrombin Time: 13.6 s (ref 11.6–15.2)

## 2011-05-14 LAB — HEMOGLOBIN AND HEMATOCRIT, BLOOD
HCT: 30 % — ABNORMAL LOW (ref 36.0–46.0)
Hemoglobin: 10.6 g/dL — ABNORMAL LOW (ref 12.0–15.0)

## 2011-05-14 MED ORDER — OXYCODONE-ACETAMINOPHEN 5-325 MG PO TABS
1.0000 | ORAL_TABLET | ORAL | Status: DC | PRN
  Administered 2011-05-16: 2 via ORAL

## 2011-05-14 MED ORDER — MONTELUKAST SODIUM 10 MG PO TABS
10.0000 mg | ORAL_TABLET | Freq: Every day | ORAL | Status: DC | PRN
  Filled 2011-05-14: qty 1

## 2011-05-14 MED ORDER — METHOCARBAMOL 500 MG PO TABS: 500.0000 mg | ORAL_TABLET | Freq: Four times a day (QID) | ORAL | Status: DC | PRN

## 2011-05-14 MED ORDER — AMLODIPINE BESYLATE 5 MG PO TABS
5.0000 mg | ORAL_TABLET | Freq: Every day | ORAL | Status: DC
  Filled 2011-05-14 (×3): qty 1

## 2011-05-14 MED ORDER — WARFARIN VIDEO: Freq: Once | Status: DC

## 2011-05-14 MED ORDER — SODIUM CHLORIDE 0.9 % IJ SOLN: 3.0000 mL | Freq: Two times a day (BID) | INTRAMUSCULAR | Status: DC

## 2011-05-14 MED ORDER — MENTHOL 3 MG MT LOZG
1.0000 | LOZENGE | OROMUCOSAL | Status: DC | PRN
  Filled 2011-05-14: qty 9

## 2011-05-14 MED ORDER — DOCUSATE SODIUM 100 MG PO CAPS
100.0000 mg | ORAL_CAPSULE | Freq: Two times a day (BID) | ORAL | Status: DC
  Administered 2011-05-16 – 2011-05-17 (×2): 100 mg via ORAL
  Filled 2011-05-14 (×7): qty 1

## 2011-05-14 MED ORDER — CHLORHEXIDINE GLUCONATE 0.12 % MT SOLN
15.0000 mL | Freq: Two times a day (BID) | OROMUCOSAL | Status: DC
  Administered 2011-05-16 – 2011-05-17 (×3): 15 mL via OROMUCOSAL
  Filled 2011-05-14 (×7): qty 15

## 2011-05-14 MED ORDER — BISACODYL 5 MG PO TBEC: 10.0000 mg | DELAYED_RELEASE_TABLET | Freq: Every day | ORAL | Status: DC | PRN

## 2011-05-14 MED ORDER — COUMADIN BOOK: Freq: Once | Status: DC

## 2011-05-14 MED ORDER — ONDANSETRON HCL 4 MG/2ML IJ SOLN: 4.0000 mg | Freq: Four times a day (QID) | INTRAMUSCULAR | Status: DC | PRN

## 2011-05-14 MED ORDER — SODIUM CHLORIDE 0.9 % IV BOLUS (SEPSIS): 250.0000 mL | INTRAVENOUS | Status: DC | PRN

## 2011-05-14 MED ORDER — ALUM & MAG HYDROXIDE-SIMETH 200-200-20 MG/5ML PO SUSP: 30.0000 mL | ORAL | Status: DC | PRN

## 2011-05-14 MED ORDER — ROSUVASTATIN CALCIUM 10 MG PO TABS
10.0000 mg | ORAL_TABLET | Freq: Every day | ORAL | Status: DC
  Administered 2011-05-16 – 2011-05-17 (×2): 10 mg via ORAL
  Filled 2011-05-14 (×4): qty 1

## 2011-05-14 MED ORDER — ONDANSETRON HCL 4 MG PO TABS
4.0000 mg | ORAL_TABLET | Freq: Four times a day (QID) | ORAL | Status: DC | PRN
Start: 2011-05-14 — End: 2011-05-17

## 2011-05-14 MED ORDER — FERROUS SULFATE 325 (65 FE) MG PO TABS
325.0000 mg | ORAL_TABLET | Freq: Three times a day (TID) | ORAL | Status: DC
  Administered 2011-05-16 – 2011-05-17 (×4): 325 mg via ORAL
  Filled 2011-05-14 (×10): qty 1

## 2011-05-14 MED ORDER — ACETAMINOPHEN 650 MG RE SUPP: 325.0000 mg | RECTAL | Status: DC | PRN

## 2011-05-14 MED ORDER — ACETAMINOPHEN 325 MG PO TABS
325.0000 mg | ORAL_TABLET | ORAL | Status: DC | PRN
  Administered 2011-05-16 (×2): 325 mg via ORAL
  Administered 2011-05-16: 650 mg via ORAL
  Administered 2011-05-16: 325 mg via ORAL
  Administered 2011-05-17 (×2): 650 mg via ORAL

## 2011-05-14 MED ORDER — METFORMIN HCL 500 MG PO TABS
500.0000 mg | ORAL_TABLET | Freq: Every day | ORAL | Status: DC
  Administered 2011-05-16: 500 mg via ORAL
  Filled 2011-05-14 (×4): qty 1

## 2011-05-14 MED ORDER — METHOCARBAMOL 100 MG/ML IJ SOLN
500.0000 mg | Freq: Four times a day (QID) | INTRAVENOUS | Status: DC | PRN
  Filled 2011-05-14: qty 5

## 2011-05-14 MED ORDER — OLMESARTAN MEDOXOMIL 20 MG PO TABS
20.0000 mg | ORAL_TABLET | Freq: Every day | ORAL | Status: DC
  Administered 2011-05-17: 20 mg via ORAL
  Filled 2011-05-14 (×4): qty 1

## 2011-05-14 MED ORDER — NIACIN ER 500 MG PO CPCR
500.0000 mg | ORAL_CAPSULE | Freq: Every day | ORAL | Status: DC
  Administered 2011-05-16: 500 mg via ORAL
  Filled 2011-05-14 (×4): qty 1

## 2011-05-14 MED ORDER — HEPARIN SODIUM (PORCINE) 5000 UNIT/ML IJ SOLN
5000.0000 [IU] | Freq: Three times a day (TID) | INTRAMUSCULAR | Status: DC
  Filled 2011-05-14 (×9): qty 1

## 2011-05-14 MED ORDER — PHENOL 1.4 % MT LIQD: 1.0000 | OROMUCOSAL | Status: DC | PRN

## 2011-05-14 MED ORDER — BISACODYL 10 MG RE SUPP: 10.0000 mg | Freq: Every day | RECTAL | Status: DC | PRN

## 2011-05-14 MED ORDER — FLEET ENEMA 7-19 GM/118ML RE ENEM: 1.0000 | ENEMA | Freq: Every day | RECTAL | Status: DC | PRN

## 2011-05-14 MED ORDER — VARENICLINE TARTRATE 1 MG PO TABS
1.0000 mg | ORAL_TABLET | Freq: Every day | ORAL | Status: DC
  Administered 2011-05-16: 1 mg via ORAL
  Filled 2011-05-14 (×5): qty 1

## 2011-05-14 MED ORDER — BIOTENE DRY MOUTH MT LIQD
15.0000 mL | Freq: Two times a day (BID) | OROMUCOSAL | Status: DC
  Administered 2011-05-16 (×2): 15 mL via OROMUCOSAL
  Filled 2011-05-14 (×7): qty 15

## 2011-05-14 MED ORDER — ALUMINUM HYDROXIDE GEL 320 MG/5ML PO SUSP
15.0000 mL | ORAL | Status: DC | PRN
  Filled 2011-05-14: qty 30

## 2011-05-15 LAB — PROTIME-INR
INR: 1.55 — ABNORMAL HIGH (ref 0.00–1.49)
Prothrombin Time: 18.9 seconds — ABNORMAL HIGH (ref 11.6–15.2)

## 2011-05-15 LAB — GLUCOSE, CAPILLARY
Glucose-Capillary: 117 mg/dL — ABNORMAL HIGH (ref 70–99)
Glucose-Capillary: 117 mg/dL — ABNORMAL HIGH (ref 70–99)

## 2011-05-15 LAB — HEMOGLOBIN AND HEMATOCRIT, BLOOD: Hemoglobin: 9.7 g/dL — ABNORMAL LOW (ref 12.0–15.0)

## 2011-05-15 MED ORDER — INSULIN ASPART 100 UNIT/ML ~~LOC~~ SOLN
0.0000 [IU] | Freq: Three times a day (TID) | SUBCUTANEOUS | Status: DC
  Administered 2011-05-16: 2 [IU] via SUBCUTANEOUS
  Filled 2011-05-15: qty 3

## 2011-05-15 MED ORDER — HEPARIN SODIUM (PORCINE) 5000 UNIT/ML IJ SOLN
5000.0000 [IU] | Freq: Two times a day (BID) | INTRAMUSCULAR | Status: DC
  Administered 2011-05-16 (×3): 5000 [IU] via SUBCUTANEOUS
  Filled 2011-05-15 (×5): qty 1

## 2011-05-15 NOTE — Op Note (Signed)
NAMELUZCLARITA, BLILEY NO.:  [id number]  MEDICAL RECORD NO.:  [id number]  LOCATION:  1538                         FACILITY:  [facility]  PHYSICIAN:  Georges Lynch. Shaarav Ripple, M.D.DATE OF BIRTH:  04-15-1941  DATE OF PROCEDURE:  05/13/2011 DATE OF DISCHARGE:                              OPERATIVE REPORT   SURGEON:  Georges Lynch. Darrelyn Hillock, M.D.  ASSISTANT:  Madlyn Frankel. Charlann Boxer, M.D.  OPERATION:  Left total hip arthroplasty utilizing the DePuy system.  The sizes used were as follows:  I utilized a porous-coated 54 mm acetabular Pinnacle.  I used the Ultrex polyethylene liner +4 neutral 36 mm inside diameter.  I used 1 cancellous screw 30 mm in length.  I used a hole eliminator in the metal cup.  The femoral stem was a size 5 high offset. The head was a ceramic head 1.5 mm in length, 36 mm diameter.  DESCRIPTION OF PROCEDURE:  Under general anesthesia, routine orthopedic prep and draping left lower extremity carried out.  A posterolateral approach to hip was carried out.  Great care was taken not to injure the underlying sciatic nerve.  Following that, I went down and incised the iliotibial band.  Dr. Charlann Boxer helped with retraction.  We then went down identified the capsule and incised the capsule and later did a capsulectomy.  Dr. Charlann Boxer then helped dislocate the femoral head and held the retractors in place and I made the appropriate soft cut to remove the femoral head.  Once this was done, I then utilized the box osteotome followed by the widening reamer followed by the canal finder.  I thoroughly irrigated out the femoral shaft several times.  I then rasped the shaft up to a size 5 Tri-Lock stem.  Following that, we then continued to prepare the acetabulum.  Dr. Charlann Boxer from his side completed the capsulectomy.  We then reamed the acetabulum up to a size 53 mm for a 54 mm cup.  Dr.  Charlann Boxer also assisted in the reaming and also in holding retractors and also helped control the bleeding.  At  this time, we then inserted our permanent Pinnacle cup, size 54 mm.  We then made the appropriate drill hole through the acetabular cup and inserted our 30 mm screw.  We did check make sure that the screw did not penetrate the bone.  Following that, we irrigated out the area and then inserted our hole eliminator into the acetabular cup.  We then inserted our Ultrex liner.  Following that, Dr. Charlann Boxer from his side then inserted the Tri- Lock stem.  He had a nice secure fit.  Following that, we then went through various leg length measurements and selected a 1.5 mm length femoral head, which was the ceramic head.  We then reduced the hip, took the hip through motion.  Before inserting our permanent ceramic head, we went through trials, checked her leg length several times, and checked for stability.  We did utilize the Tri-Lock stem, which was a high offset size 5.  Once the hip was reduced,we had stable construct.  We took the hip through range of motion.  Thoroughly irrigated out the area reapproximated the hip and  soft tissue structures in usual fashion. Once again great care was taken not to injure the underlying sciatic nerve.  Sterile dressings were applied.  The patient left the operative room in satisfactory condition.          ______________________________ Georges Lynch Darrelyn Hillock, M.D.     RAG/MEDQ  D:  05/13/2011  T:  05/14/2011  Job:  [id number]  cc:   [name], M.D. Fax: [phone]  Pamella Pert, MD Fax: [phone]  Electronically Signed by Ranee Gosselin M.D. on 05/15/2011 08:22:45 AM

## 2011-05-16 DIAGNOSIS — M1610 Unilateral primary osteoarthritis, unspecified hip: Secondary | ICD-10-CM | POA: Diagnosis present

## 2011-05-16 LAB — GLUCOSE, CAPILLARY
Glucose-Capillary: 108 mg/dL — ABNORMAL HIGH (ref 70–99)
Glucose-Capillary: 97 mg/dL (ref 70–99)
Glucose-Capillary: 114 mg/dL — ABNORMAL HIGH (ref 70–99)

## 2011-05-16 LAB — PROTIME-INR: INR: 1.77 — ABNORMAL HIGH (ref 0.00–1.49)

## 2011-05-16 MED ORDER — OXYCODONE HCL 5 MG PO TABS
ORAL_TABLET | ORAL | Status: AC
  Filled 2011-05-16: qty 1

## 2011-05-16 MED ORDER — ROXICET 5-325 MG PO TABS: 1.0000 | ORAL_TABLET | ORAL | Status: AC | PRN

## 2011-05-16 MED ORDER — ACETAMINOPHEN 325 MG PO TABS
ORAL_TABLET | ORAL | Status: AC
  Administered 2011-05-16: 650 mg via ORAL
  Filled 2011-05-16: qty 2

## 2011-05-16 MED ORDER — ACETAMINOPHEN 325 MG PO TABS
ORAL_TABLET | ORAL | Status: AC
  Administered 2011-05-16: 325 mg via ORAL
  Filled 2011-05-16: qty 2

## 2011-05-16 MED ORDER — OXYCODONE-ACETAMINOPHEN 5-325 MG PO TABS
ORAL_TABLET | ORAL | Status: AC
  Filled 2011-05-16: qty 2

## 2011-05-16 MED ORDER — WARFARIN SODIUM 5 MG PO TABS: 5.0000 mg | ORAL_TABLET | Freq: Once | ORAL | Status: DC

## 2011-05-16 MED ORDER — WARFARIN SODIUM 5 MG PO TABS
5.0000 mg | ORAL_TABLET | Freq: Once | ORAL | Status: AC
  Administered 2011-05-16: 5 mg via ORAL
  Filled 2011-05-16: qty 1

## 2011-05-16 MED ORDER — ACETAMINOPHEN 325 MG PO TABS
ORAL_TABLET | ORAL | Status: AC
  Filled 2011-05-16: qty 2

## 2011-05-16 MED ORDER — FERROUS SULFATE 325 (65 FE) MG PO TABS: 325.0000 mg | ORAL_TABLET | Freq: Three times a day (TID) | ORAL | Status: DC

## 2011-05-16 MED ORDER — SODIUM CHLORIDE 0.9 % IV SOLN
INTRAVENOUS | Status: DC
  Administered 2011-05-16 (×2): via INTRAVENOUS

## 2011-05-16 NOTE — Progress Notes (Signed)
  ANTICOAGULATION CONSULT NOTE - Follow Up Consult  Pharmacy Consult for Coumadin Indication: VTE prophylaxis  Allergies  Allergen Reactions  . Erythromycin Hives    Patient Measurements: Height: 5\' 10"  (177.8 cm) (entered during cutover) Weight: 165 lb 2 oz (74.9 kg) (entered during cutover) IBW/kg (Calculated) : 68.5   Vital Signs: Temp: 98.3 F (36.8 C) (11/04 0604) Temp src: Oral (11/04 0604) BP: 134/61 mmHg (11/04 0526) Pulse Rate: 88  (11/04 0604)  Labs:  Basename 05/16/11 0447 05/15/11 1620 05/15/11 0431 05/14/11 1102 05/14/11 0425  HGB -- 9.7* -- 10.6* --  HCT -- 28.1* -- 30.0* --  PLT -- -- -- -- --  APTT -- -- -- -- --  LABPROT 20.9* -- 18.9* -- 13.6  INR 1.77* -- 1.55* -- 1.02  HEPARINUNFRC -- -- -- -- --  CREATININE -- -- -- -- --  CKTOTAL -- -- -- -- --  CKMB -- -- -- -- --  TROPONINI -- -- -- -- --   Estimated Creatinine Clearance: 72.8 ml/min (by C-G formula based on Cr of 0.63).   Medications:  Scheduled:    . amLODipine  5 mg Oral Daily  . antiseptic oral rinse  15 mL Mouth Rinse q12n4p  . chlorhexidine  15 mL Mouth/Throat BID  . docusate sodium  100 mg Oral BID  . ferrous sulfate  325 mg Oral TID WC  . heparin subcutaneous  5,000 Units Subcutaneous Q12H  . insulin aspart  0-15 Units Subcutaneous TID WC  . metFORMIN  500 mg Oral QHS  . niacin  500 mg Oral QHS  . olmesartan  20 mg Oral Daily  . rosuvastatin  10 mg Oral Daily  . sodium chloride  3 mL Intravenous Q12H  . varenicline  1 mg Oral Daily  . DISCONTD: heparin subcutaneous  5,000 Units Subcutaneous Q8H  . DISCONTD: oxyCODONE-acetaminophen       PRN: acetaminophen, acetaminophen, alum & mag hydroxide-simeth, aluminum hydroxide, bisacodyl, bisacodyl, menthol-cetylpyridinium, methocarbamol(ROBAXIN) IV, methocarbamol, montelukast, ondansetron (ZOFRAN) IV, ondansetron, oxyCODONE-acetaminophen, phenol, sodium chloride, sodium phosphate  Assessment: INR rising nicely towards goal.  No  bleeding reported. Tolerating diet okay.   Goal of Therapy:  INR 2-3   Plan:  Repeat Coumadin 5mg  today. Continue heparin sq. Probable discharge tomorrow.  Reece Packer 05/16/2011,9:15 AM

## 2011-05-16 NOTE — Progress Notes (Signed)
Kristy Salazar  MRN: [id number] DOB/Age: 68/28/1942 68 y.o. Physician: Georges Lynch. [name] Procedure:   @RRDAYPOSTSURGERY @  BP is Low,will DC BP meds,and increase Iv Fluids  Vital Signs Temp:  [98.3 F (36.8 C)-100.2 F (37.9 C)] 98.5 F (36.9 C) (11/04 0940) Pulse Rate:  [88-98] 92  (11/04 0940) Resp:  [18] 18  (11/04 0940) BP: (78-134)/(44-61) 82/48 mmHg (11/04 0941) SpO2:  [95 %-99 %] 99 % (11/04 0940)  Lab Results  Basename 05/15/11 1620 05/14/11 1102  WBC -- --  HGB 9.7* 10.6*  HCT 28.1* 30.0*  PLT -- --   BMET No results found for this basename: NA:2,K:2,CL:2,CO2:2,GLUCOSE:2,BUN:2,CREATININE:2,CALCIUM:2 in the last 72 hours CBG: NA INR  Date Value Range Status  05/16/2011 1.77* 0.00-1.49 (no units) Final    Exam  Hip Status: Well-Located Extremities:Calf tenderness:none Pulses: Intact Neurologic:Dorsiflexion Intact Incision/Wound: Normal   Anticoagulant: Coumadin  Hemovac: None  Post-Op X-Ray: normal  Transfusion: None  Plan Pt to be discharged Possibly Tomorrow Return to clinic for follow-up: 2-weeks  after Surgery  [name] 05/16/2011, 10:02 AM

## 2011-05-16 NOTE — Patient Instructions (Signed)
Precautions for total hip replacement; bed mobility and use of walker.

## 2011-05-16 NOTE — Progress Notes (Signed)
Physical Therapy Evaluation Patient Details Name: Kristy Salazar MRN: [id number] DOB: February 20, 1941 Today's Date: 05/16/2011  Problem List: There is no problem list on file for this patient.   Past Medical History: No past medical history on file. Past Surgical History: No past surgical history on file.  PT Assessment/Plan/Recommendation  Please refer to initial evaluation in hard copy form.  Evaluation was completed prior to going live with epic. PT Goals  Acute Rehab PT Goals PT Goal Formulation: With patient/family Time For Goal Achievement: 4 days Pt will go Supine/Side to Sit: with modified independence Pt will Transfer Sit to Stand/Stand to Sit: with modified independence Pt will Ambulate: >150 feet;with modified independence;with rolling walker Pt will Go Up / Down Stairs: 3-5 stairs;with supervision;with rail(s) Additional Goals Additional Goal #1: Pt will be able to verbalize 2/3 hip precautions  PT Evaluation Precautions/Restrictions  Precautions Precautions: Posterior Hip Required Braces or Orthoses: Yes Prior Functioning   I see eval Cognition  see eval Sensation/Coordination  see eval Extremity Assessment   Mobility (including Balance) Bed Mobility Bed Mobility: Yes Supine to Sit: 4: Min assist Sitting - Scoot to Edge of Bed: 4: Min assist Transfers Transfers: Yes Sit to Stand: 4: Min assist Ambulation/Gait Ambulation/Gait: Yes Ambulation/Gait Assistance: 5: Supervision Ambulation/Gait Assistance Details (indicate cue type and reason): verbal cuing needed to keep patient looking up. Ambulation Distance (Feet): 30 Feet Assistive device: Rolling walker Gait Pattern: Step-to pattern Gait velocity: slow Stairs: No Wheelchair Mobility Wheelchair Mobility: No  Balance Balance Assessed: No Exercise  Total Joint Exercises Ankle Circles/Pumps: AROM;Strengthening;Both;10 reps;Supine Quad Sets: AROM;Strengthening;Both;10 reps;Supine Gluteal Sets:  AROM;Strengthening;Both;10 reps Short Arc Quad: AROM;Strengthening;Left;10 reps;Supine Heel Slides: AROM;Strengthening;Left;10 reps;Supine Hip ABduction/ADduction: AROM;Strengthening;Left;10 reps;Supine End of Session PT - End of Session Equipment Utilized During Treatment: Gait belt Activity Tolerance: Patient tolerated treatment well Patient left: in chair;with call bell in reach;with family/visitor present General Behavior During Session: [facility] for tasks performed Cognition: [facility] for tasks performed  RUSSELL,CINDY 05/16/2011, 9:27 AM

## 2011-05-16 NOTE — Discharge Summary (Signed)
Physician Discharge Summary  Patient ID: Kristy Salazar MRN: [id number] DOB/AGE: 1941/03/01 69 y.o.  Admit date: 05/13/2011  Discharge date: 05/17/2011  Admission Diagnoses: OA LEFT HIP  Discharge Diagnoses: Same  Discharged Condition: stable  Hospital Course:  I reviewed vitals, labs, x-rays, and performed physical exam daily.  There were no complicating factors post-operatively.   [name] was stable  Consults: none  Significant Diagnostic Studies: radiology: X-Ray: Post op Hip xray  Operative Procedure:  Left Total Hip.  Date of Surgical Procedure: Nov.1,2012  Disposition: Plan is home DC   Current Discharge Medication List    START taking these medications   Details  ferrous sulfate 325 (65 FE) MG tablet Take 1 tablet (325 mg total) by mouth 3 (three) times daily with meals. Qty: 30 tablet, Refills: 0    ROXICET 5-325 MG per tablet Take 1 tablet by mouth every 4 (four) hours as needed for pain. Qty: 30 tablet, Refills: 0    warfarin (COUMADIN) 5 MG tablet Take 1 tablet (5 mg total) by mouth one time only at 6 PM. Qty: 36 tablet, Refills: 1      CONTINUE these medications which have NOT CHANGED   Details  metFORMIN (GLUCOPHAGE) 500 MG tablet Take 500 mg by mouth at bedtime.      montelukast (SINGULAIR) 10 MG tablet Take 10 mg by mouth daily as needed. For seasonal allergies     niacin (NIASPAN) 500 MG CR tablet Take 500 mg by mouth at bedtime.      rosuvastatin (CRESTOR) 10 MG tablet Take 10 mg by mouth daily.      varenicline (CHANTIX PAK) 0.5 MG X 11 & 1 MG X 42 tablet Take 1 mg by mouth 2 (two) times daily. Take one 0.5mg  tablet by mouth once daily for 3 days, then increase to one 0.5mg  tablet twice daily for 4 days, then increase to one 1mg  tablet twice daily for 7 days       STOP taking these medications     amLODipine-valsartan (EXFORGE) 5-160 MG per tablet      aspirin EC 81 MG tablet        Follow-up Information    Follow up with  Kristy Salazar. Make an appointment in 2 weeks. (Call Sooner if any Problems)    Contact information:   [facility] [address] [place] [address] [phone]          Discharge Instructions: See me in 1.5 weeks.Change dressings daily. May shower. Call me if any problems.  Signed: [name] 05/17/2011, 7:28 AM

## 2011-05-17 LAB — HEMOGLOBIN AND HEMATOCRIT, BLOOD
HCT: 23.7 % — ABNORMAL LOW (ref 36.0–46.0)
Hemoglobin: 8.1 g/dL — ABNORMAL LOW (ref 12.0–15.0)

## 2011-05-17 MED ORDER — WARFARIN SODIUM 5 MG PO TABS
5.0000 mg | ORAL_TABLET | Freq: Once | ORAL | Status: DC
  Filled 2011-05-17: qty 1

## 2011-05-17 NOTE — Plan of Care (Signed)
Problem: Consults Goal: Diagnosis- Total Joint Replacement Outcome: Completed/Met Date Met:  05/17/11 Primary Total Hip  Problem: Discharge Progression Outcomes Goal: Barriers To Progression Addressed/Resolved Outcome: Progressing Pts bp low and monitoring bp. Changing pts home bp meds.

## 2011-05-17 NOTE — Progress Notes (Signed)
ANTICOAGULATION CONSULT NOTE - Follow Up Consult  Pharmacy Consult for Warfarin Indication: VTE prophylaxis s/p THA  Allergies  Allergen Reactions  . Erythromycin Hives    Patient Measurements: Height: 5\' 10"  (177.8 cm) (entered during cutover) Weight: 165 lb 2 oz (74.9 kg) (entered during cutover) IBW/kg (Calculated) : 68.5    Vital Signs: Temp: 99 F (37.2 C) (11/05 0624) Temp src: Oral (11/05 0624) BP: 103/68 mmHg (11/05 0624) Pulse Rate: 88  (11/05 0624)  Labs:  Basename 05/17/11 0355 05/16/11 0447 05/15/11 1620 05/15/11 0431 05/14/11 1102  HGB 8.1* -- 9.7* -- --  HCT 23.7* -- 28.1* -- 30.0*  PLT -- -- -- -- --  APTT -- -- -- -- --  LABPROT 21.5* 20.9* -- 18.9* --  INR 1.83* 1.77* -- 1.55* --  HEPARINUNFRC -- -- -- -- --  CREATININE -- -- -- -- --  CKTOTAL -- -- -- -- --  CKMB -- -- -- -- --  TROPONINI -- -- -- -- --   Estimated Creatinine Clearance: 72.8 ml/min (by C-G formula based on Cr of 0.63).   Medications:  Scheduled:    . antiseptic oral rinse  15 mL Mouth Rinse q12n4p  . chlorhexidine  15 mL Mouth/Throat BID  . docusate sodium  100 mg Oral BID  . ferrous sulfate  325 mg Oral TID WC  . heparin subcutaneous  5,000 Units Subcutaneous Q12H  . insulin aspart  0-15 Units Subcutaneous TID WC  . metFORMIN  500 mg Oral QHS  . niacin  500 mg Oral QHS  . olmesartan  20 mg Oral Daily  . rosuvastatin  10 mg Oral Daily  . varenicline  1 mg Oral Daily  . warfarin  5 mg Oral ONCE-1800  . DISCONTD: amLODipine  5 mg Oral Daily  . DISCONTD: sodium chloride  3 mL Intravenous Q12H    Assessment: INR rising effectively towards goal. No bleeding reported.  Warfarin administrations: 11/1 7.5mg  11/2 7.5mg  11/3 5mg  11/4 5mg  Pt also receiving 5000 units SQ q12 until INR therapeutic.  Goal of Therapy:  INR 2-3   Plan:  Warfarin 5mg  po once today. F/u INR in AM.  Clance Boll 05/17/2011,8:04 AM

## 2011-05-17 NOTE — Progress Notes (Addendum)
Subjective: Doing well   Objective: Vital signs in last 24 hours: Temp:  [98.5 F (36.9 C)-99.1 F (37.3 C)] 99 F (37.2 C) (11/05 0624) Pulse Rate:  [88-96] 88  (11/05 0624) Resp:  [18] 18  (11/05 0624) BP: (82-103)/(48-68) 103/68 mmHg (11/05 0624) SpO2:  [94 %-99 %] 97 % (11/04 2219)  Intake/Output from previous day: 11/04 0701 - 11/05 0700 In: 2279.6 [P.O.:840; I.V.:1439.6] Out: 1500 [Urine:1500] Intake/Output this shift: Total I/O In: 1799.6 [P.O.:360; I.V.:1439.6] Out: 700 [Urine:700]   Basename 05/17/11 0355 05/15/11 1620 05/14/11 1102  HGB 8.1* 9.7* 10.6*    Basename 05/17/11 0355 05/15/11 1620  WBC -- --  RBC -- --  HCT 23.7* 28.1*  PLT -- --   No results found for this basename: NA:2,K:2,CL:2,CO2:2,BUN:2,CREATININE:2,GLUCOSE:2,CALCIUM:2 in the last 72 hours  Basename 05/17/11 0355 05/16/11 0447  LABPT -- --  INR 1.83* 1.77*    Neurovascular intact Incision: scant drainage  Assessment/Plan: Discharged today .will see in office 1.5 weeks.   Brennyn Haisley A 05/17/2011, 6:46 AM

## 2011-10-31 ENCOUNTER — Emergency Department (INDEPENDENT_AMBULATORY_CARE_PROVIDER_SITE_OTHER)
Admission: EM | Admit: 2011-10-31 | Discharge: 2011-10-31 | Disposition: A | Discharge status: Home / Self Care | Payer: BC Managed Care – PPO | Source: Home / Self Care | Attending: Family Medicine | Admitting: Family Medicine

## 2011-10-31 ENCOUNTER — Encounter (HOSPITAL_COMMUNITY): Payer: Self-pay | Admitting: *Deleted

## 2011-10-31 ENCOUNTER — Emergency Department (INDEPENDENT_AMBULATORY_CARE_PROVIDER_SITE_OTHER): Payer: BC Managed Care – PPO

## 2011-10-31 DIAGNOSIS — S90129A Contusion of unspecified lesser toe(s) without damage to nail, initial encounter: Secondary | ICD-10-CM

## 2011-10-31 DIAGNOSIS — S90122A Contusion of left lesser toe(s) without damage to nail, initial encounter: Secondary | ICD-10-CM

## 2011-10-31 HISTORY — DX: Essential (primary) hypertension: I10

## 2011-10-31 HISTORY — DX: Other seasonal allergic rhinitis: J30.2

## 2011-10-31 NOTE — ED Notes (Signed)
Pt injured 5th left toe Friday am now with increased pain and swelling

## 2011-10-31 NOTE — ED Provider Notes (Signed)
History     CSN: [id number]  Arrival date & time 10/31/11  0901   First MD Initiated Contact with Patient 10/31/11 [phone]      Chief Complaint  Patient presents with  . Toe Injury  . Toe Pain    (Consider location/radiation/quality/duration/timing/severity/associated sxs/prior treatment) Patient is a 71 y.o. female presenting with toe pain. The history is provided by the patient.  Toe Pain This is a new problem. The current episode started 2 days ago (barefoot and hit toe at home against chair, increasing pain and swellingh). The problem has been gradually worsening. The symptoms are aggravated by walking.    Past Medical History  Diagnosis Date  . Hypertension   . Diabetes mellitus   . Seasonal allergies   . Asthma     Past Surgical History  Procedure Date  . Joint replacement   . Appendectomy   . Abdominal hysterectomy   . Back surgery   . Total knee arthroplasty   . Total hip arthroplasty     History reviewed. No pertinent family history.  History  Substance Use Topics  . Smoking status: Current Everyday Smoker  . Smokeless tobacco: Not on file  . Alcohol Use: No    OB History    Grav Para Term Preterm Abortions TAB SAB Ect Mult Living                  Review of Systems  Constitutional: Negative.   Musculoskeletal: Positive for gait problem.    Allergies  Erythromycin  Home Medications   Current Outpatient Rx  Name Route Sig Dispense Refill  . EXFORGE PO Oral Take by mouth.    . METFORMIN HCL 500 MG PO TABS Oral Take 500 mg by mouth at bedtime.      Marland Kitchen NIACIN ER (ANTIHYPERLIPIDEMIC) 500 MG PO TBCR Oral Take 500 mg by mouth at bedtime.      Marland Kitchen ROSUVASTATIN CALCIUM 10 MG PO TABS Oral Take 10 mg by mouth daily.      Marland Kitchen FERROUS SULFATE 325 (65 FE) MG PO TABS Oral Take 1 tablet (325 mg total) by mouth 3 (three) times daily with meals. 30 tablet 0    Dispense as written.  Marland Kitchen MONTELUKAST SODIUM 10 MG PO TABS Oral Take 10 mg by mouth daily as needed. For  seasonal allergies     . VARENICLINE TARTRATE 0.5 MG X 11 & 1 MG X 42 PO MISC Oral Take 1 mg by mouth 2 (two) times daily. Take one 0.5mg  tablet by mouth once daily for 3 days, then increase to one 0.5mg  tablet twice daily for 4 days, then increase to one 1mg  tablet twice daily for 7 days     . WARFARIN SODIUM 5 MG PO TABS Oral Take 1 tablet (5 mg total) by mouth one time only at 6 PM. 36 tablet 1    Dispense as written.    BP 175/89  Pulse 70  Temp(Src) 97.6 F (36.4 C) (Oral)  Resp 17  SpO2 100%  Physical Exam  Nursing note and vitals reviewed. Constitutional: She is oriented to person, place, and time. She appears well-developed and well-nourished.  Musculoskeletal: She exhibits tenderness.       Feet:  Neurological: She is alert and oriented to person, place, and time.  Skin: Skin is warm and dry.    ED Course  Procedures (including critical care time)  Labs Reviewed - No data to display No results found.   1. Contusion of fifth  toe of left foot       MDM  X-rays reviewed and report per radiologist.         Linna Hoff, MD 11/03/11 2052

## 2011-10-31 NOTE — Discharge Instructions (Signed)
Ice, ibuprofen and wear shoe for comfort as needed, activity as tolerated, return if needed.

## 2012-07-18 ENCOUNTER — Emergency Department (INDEPENDENT_AMBULATORY_CARE_PROVIDER_SITE_OTHER): Payer: BC Managed Care – PPO

## 2012-07-18 ENCOUNTER — Encounter (HOSPITAL_COMMUNITY): Payer: Self-pay

## 2012-07-18 ENCOUNTER — Emergency Department (INDEPENDENT_AMBULATORY_CARE_PROVIDER_SITE_OTHER)
Admission: EM | Admit: 2012-07-18 | Discharge: 2012-07-18 | Disposition: A | Discharge status: Home / Self Care | Payer: BC Managed Care – PPO | Source: Home / Self Care

## 2012-07-18 DIAGNOSIS — S8253XA Displaced fracture of medial malleolus of unspecified tibia, initial encounter for closed fracture: Secondary | ICD-10-CM

## 2012-07-18 DIAGNOSIS — S93409A Sprain of unspecified ligament of unspecified ankle, initial encounter: Secondary | ICD-10-CM

## 2012-07-18 MED ORDER — HYDROCODONE-ACETAMINOPHEN 5-325 MG PO TABS
1.0000 | ORAL_TABLET | Freq: Once | ORAL | Status: AC
  Administered 2012-07-18: 1 via ORAL

## 2012-07-18 MED ORDER — HYDROCODONE-ACETAMINOPHEN 5-325 MG PO TABS
ORAL_TABLET | ORAL | Status: AC
  Filled 2012-07-18: qty 1

## 2012-07-18 NOTE — ED Notes (Signed)
States she sprained /strained her right foot when she stepped from curb when she left work earlier today, and the pain has reportedly gotten worse as the day has progressed. Has not taken any medication for her pain

## 2012-07-18 NOTE — ED Provider Notes (Signed)
History     CSN: [id number]  Arrival date & time 07/18/12  1702   None     Chief Complaint  Patient presents with  . Foot Injury    (Consider location/radiation/quality/duration/timing/severity/associated sxs/prior treatment) HPI Comments: 72 year old female stepped off a curb and injured her right ankle this afternoon. The pain has been getting worse through the ensuing hours. She denies pain to the foot or the toes. She is complaining of pain across the dorsal aspect of the ankle as well as the medial and lateral aspect where there is soft tissue swelling. No direct bony tenderness initiated. A deformity. She denies injury to her head neck chest back abdomen or other extremities.   Past Medical History  Diagnosis Date  . Hypertension   . Diabetes mellitus   . Seasonal allergies   . Asthma     Past Surgical History  Procedure Date  . Joint replacement   . Appendectomy   . Abdominal hysterectomy   . Back surgery   . Total knee arthroplasty   . Total hip arthroplasty     History reviewed. No pertinent family history.  History  Substance Use Topics  . Smoking status: Current Every Day Smoker  . Smokeless tobacco: Not on file  . Alcohol Use: No    OB History    Grav Para Term Preterm Abortions TAB SAB Ect Mult Living                  Review of Systems  Constitutional: Negative.  Negative for fever, chills and activity change.  HENT: Negative.   Respiratory: Negative.   Cardiovascular: Negative.   Musculoskeletal:       As per HPI  Skin: Negative for color change, pallor and rash.  Neurological: Negative.     Allergies  Erythromycin  Home Medications   Current Outpatient Rx  Name  Route  Sig  Dispense  Refill  . EXFORGE PO   Oral   Take by mouth.         Di Kindle SULFATE 325 (65 FE) MG PO TABS   Oral   Take 1 tablet (325 mg total) by mouth 3 (three) times daily with meals.   30 tablet   0     Dispense as written.   Marland Kitchen METFORMIN HCL 500 MG  PO TABS   Oral   Take 500 mg by mouth at bedtime.           Marland Kitchen MONTELUKAST SODIUM 10 MG PO TABS   Oral   Take 10 mg by mouth daily as needed. For seasonal allergies          . NIACIN ER (ANTIHYPERLIPIDEMIC) 500 MG PO TBCR   Oral   Take 500 mg by mouth at bedtime.           Marland Kitchen ROSUVASTATIN CALCIUM 10 MG PO TABS   Oral   Take 10 mg by mouth daily.           Marland Kitchen VARENICLINE TARTRATE 0.5 MG X 11 & 1 MG X 42 PO MISC   Oral   Take 1 mg by mouth 2 (two) times daily. Take one 0.5mg  tablet by mouth once daily for 3 days, then increase to one 0.5mg  tablet twice daily for 4 days, then increase to one 1mg  tablet twice daily for 7 days          . WARFARIN SODIUM 5 MG PO TABS   Oral   Take 1 tablet (5 mg  total) by mouth one time only at 6 PM.   36 tablet   1     Dispense as written.     BP 162/88  Pulse 85  Temp 98.9 F (37.2 C) (Oral)  Resp 14  SpO2 95%  Physical Exam  Constitutional: She is oriented to person, place, and time. She appears well-developed and well-nourished. No distress.  HENT:  Head: Normocephalic and atraumatic.  Eyes: EOM are normal. Pupils are equal, round, and reactive to light.  Neck: Normal range of motion. Neck supple.  Musculoskeletal:       Tenderness across the dorsal, medial and lateral aspect of the foot. Swelling to the bilateral malleolar. No deformity. No discoloration. No tenderness of the foot or toes. She is able to wiggle her toes and dorsiflex plantar flex to a minimal degree. The pulses 2+.  Lymphadenopathy:    She has no cervical adenopathy.  Neurological: She is alert and oriented to person, place, and time. No cranial nerve deficit.  Skin: Skin is warm and dry.    ED Course  Procedures (including critical care time)  Labs Reviewed - No data to display Dg Ankle Complete Right  07/18/2012  *RADIOLOGY REPORT*  Clinical Data: Left foot/ankle pain  RIGHT ANKLE - COMPLETE 3+ VIEW  Comparison: None.  Findings: Possible tiny avulsion  fracture along the medial malleolus.  Ankle mortise is otherwise intact.  The base of the fifth metatarsal is unremarkable.  Mild medial soft tissue swelling.  IMPRESSION: Possible tiny avulsion fracture along the medial malleolus. Correlate with the site of the patient's pain.   Original Report Authenticated By: Charline Bills, M.D.    Dg Foot Complete Right  07/18/2012  *RADIOLOGY REPORT*  Clinical Data: History of injury and pain.  Pain in superior aspect of metatarsals.  History of previous fracture.  RIGHT FOOT COMPLETE - 3+ VIEW  Comparison: None.  Findings: There is first metatarsal varus - hallux valgus configuration.  Alignment is otherwise normal. Alignment is normal. Joint spaces are preserved.  No fracture or dislocation is evident. No soft tissue lesions are seen.  IMPRESSION: Hallux valgus configuration.  No fracture or dislocation.   Original Report Authenticated By: Onalee Hua Call      1. Ankle sprain   2. Avulsion fracture of medial malleolus       MDM  Rest, ice for the next 48 hours, elevation, use crutches for the next several days. Gradually bear weight as tolerated.. The wrap that we placed for the next 7-10 days. Call your orthopedist for appointment tomorrow to see sometime next week. Out of work for the next 3 days Instructions for ankle sprain and avulsion fracture.        Hayden Rasmussen, NP 07/18/12 1835

## 2012-07-19 NOTE — ED Provider Notes (Signed)
Medical screening examination/treatment/procedure(s) were performed by resident physician or non-physician practitioner and as supervising physician I was immediately available for consultation/collaboration.   Mavin Dyke DOUGLAS MD.    Elimelech Houseman D Casy Brunetto, MD 07/19/12 2055 

## 2012-10-15 IMAGING — CR DG CHEST 2V
2 series · 2 of 2 positions shown · non-contrast
Comparison: 01/08/2008

CLINICAL DATA: Preoperative respiratory exam for hip replacement

CHEST - 2 VIEW

[w chest pa]
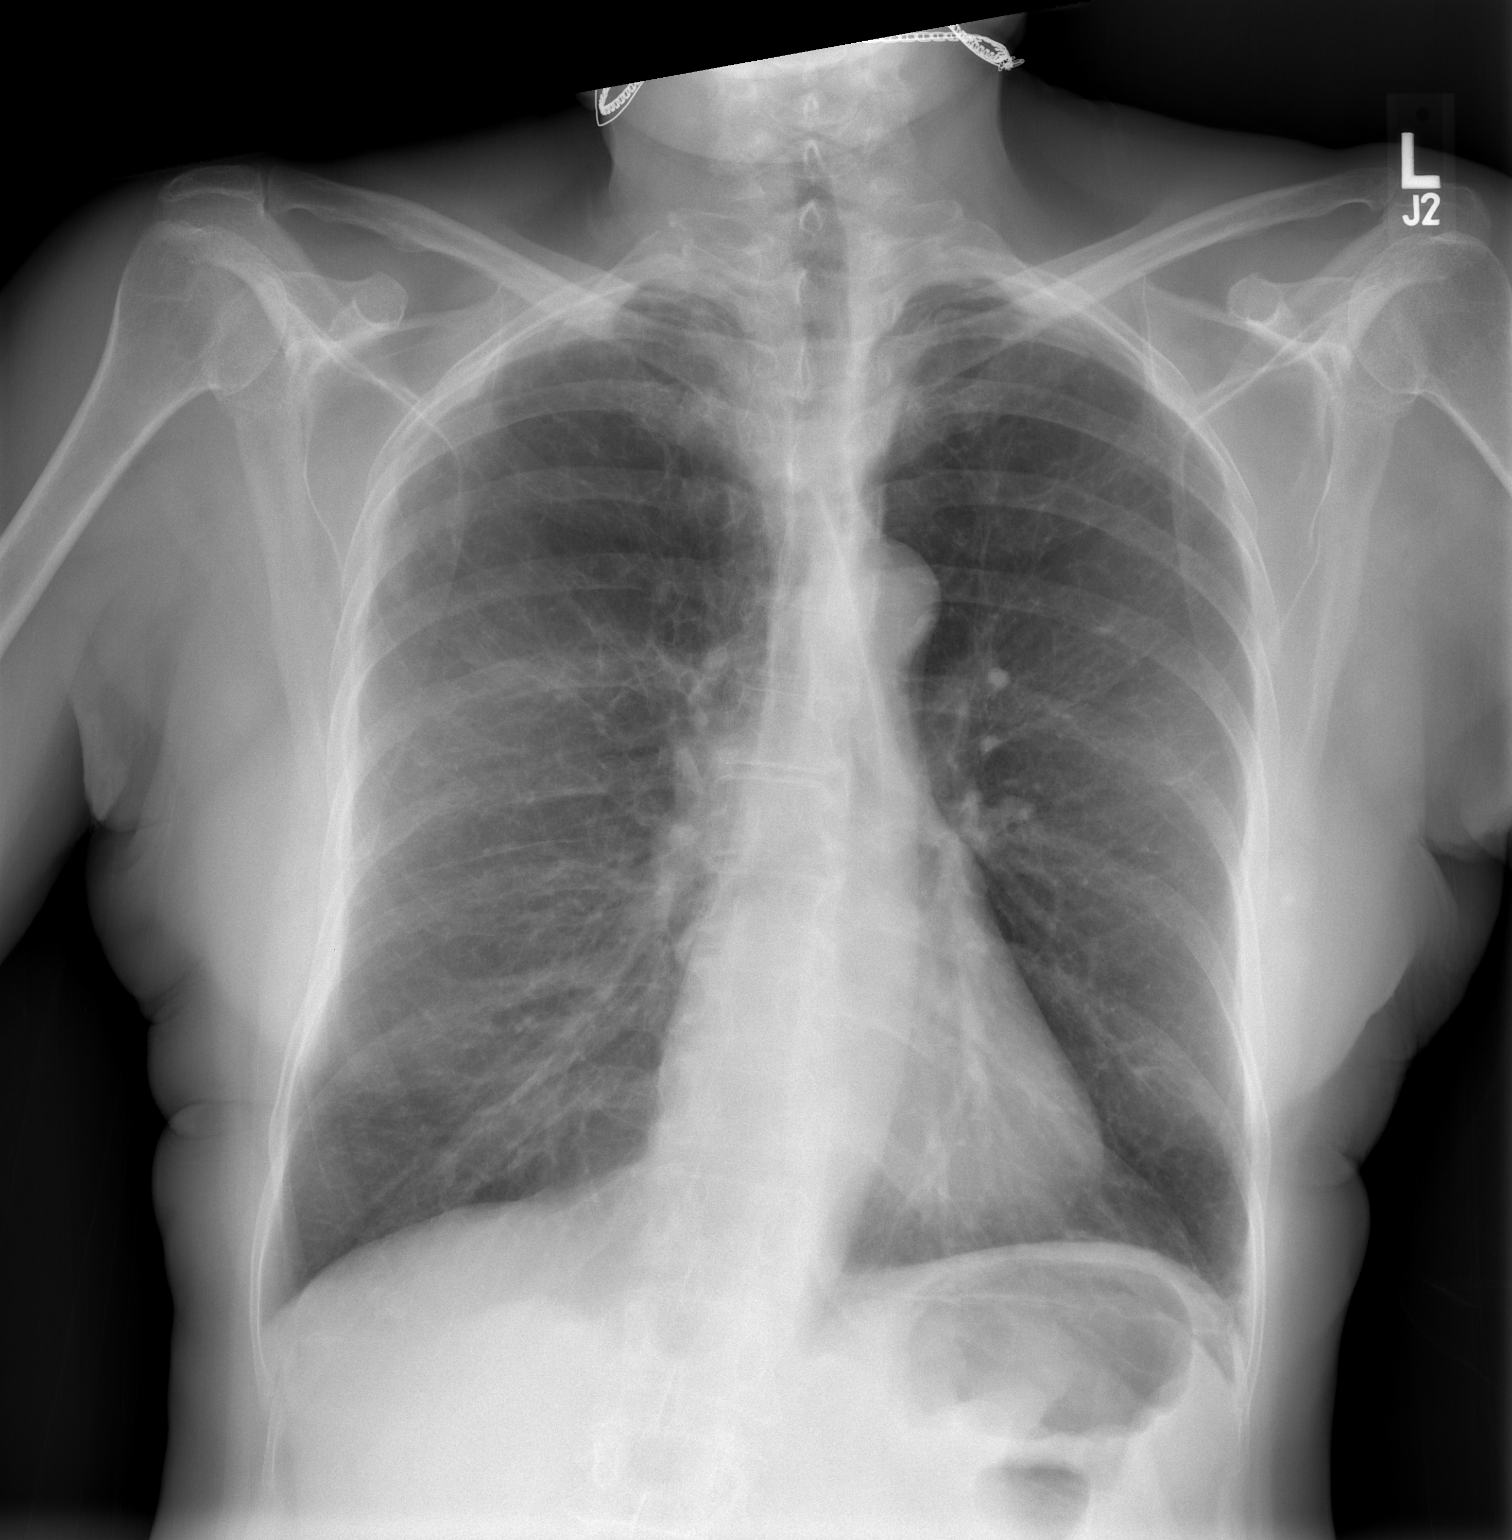

[w chest lat]
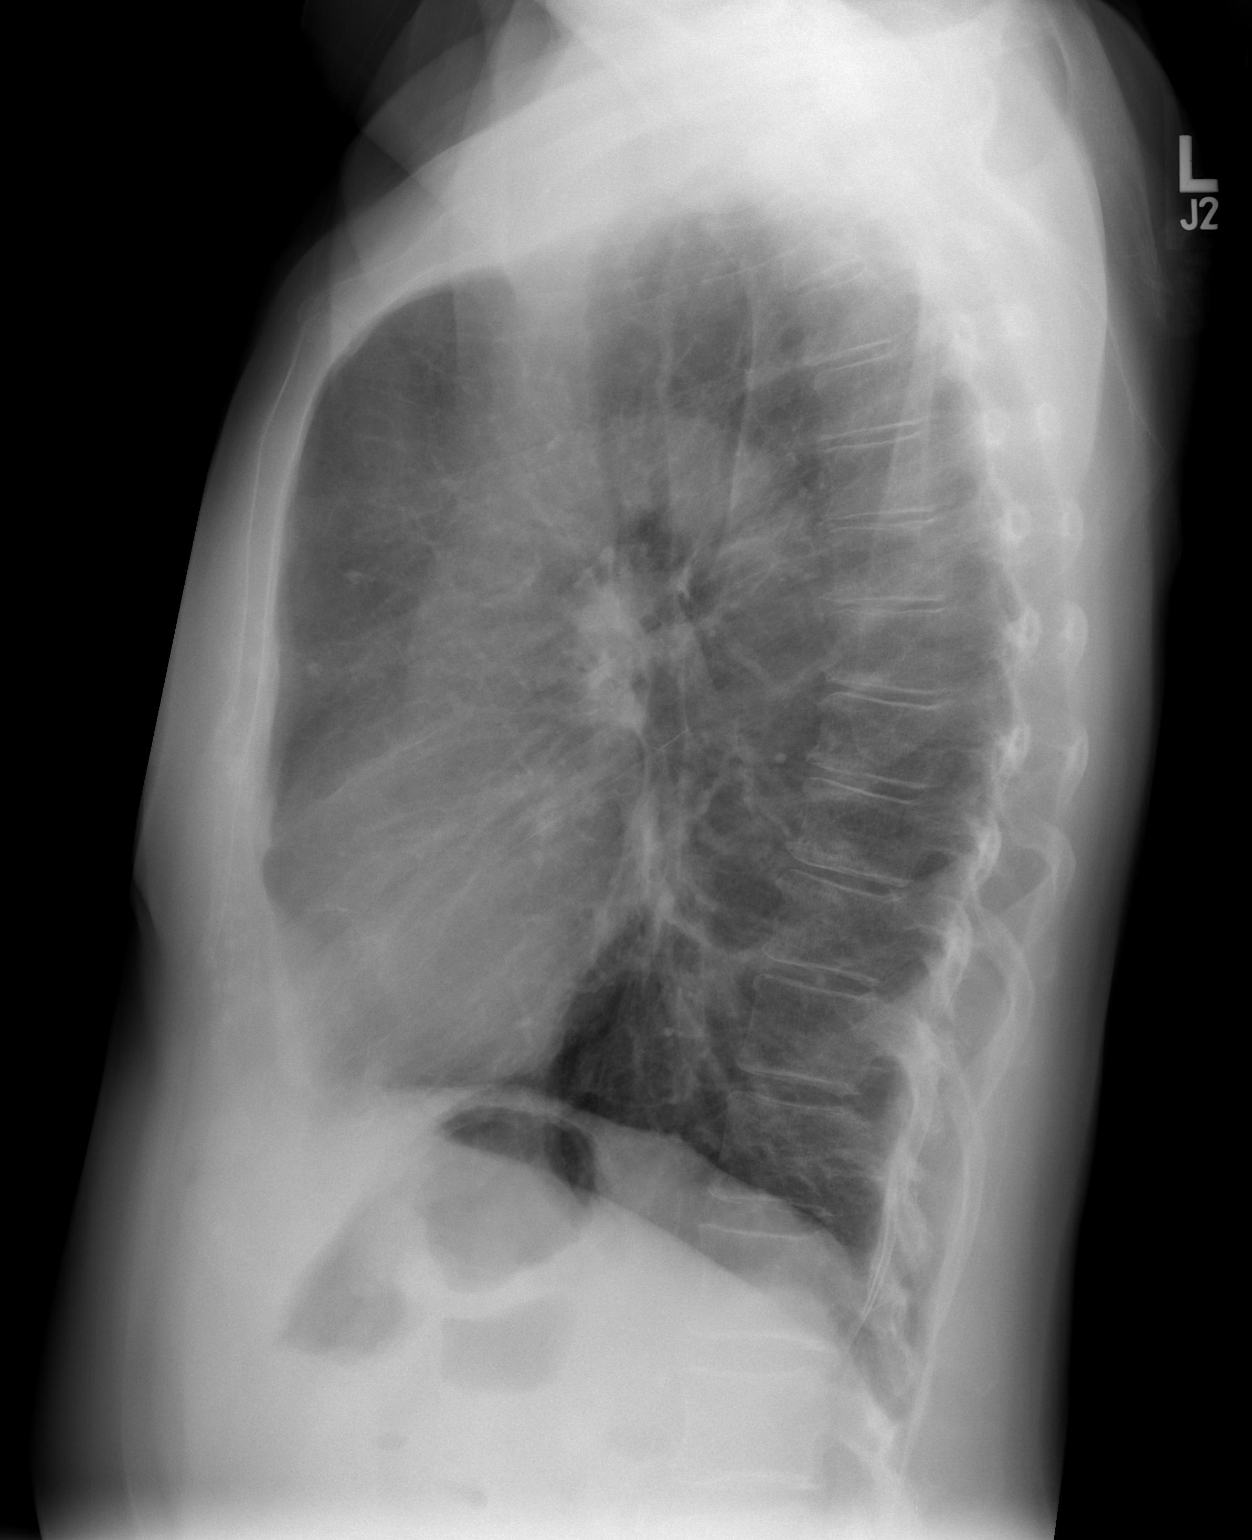

[2 of 2 positions shown; findings below may reference images not displayed]

FINDINGS: Heart size is normal.  The lungs are hyperinflated and
there are prominent interstitial markings likely to indicate
underlying emphysema.  A few small granulomas are seen in the
retrosternal space on the lateral view.  No evidence of developing
mass, infiltrate, effusion or collapse.  No significant bony
finding.
IMPRESSION: Chronic emphysematous change of the lungs, pronounced.  No active
process.

## 2013-05-02 ENCOUNTER — Other Ambulatory Visit: Payer: Self-pay

## 2015-01-01 DIAGNOSIS — E780 Pure hypercholesterolemia: Secondary | ICD-10-CM | POA: Diagnosis not present

## 2015-01-01 DIAGNOSIS — J441 Chronic obstructive pulmonary disease with (acute) exacerbation: Secondary | ICD-10-CM | POA: Diagnosis not present

## 2015-01-01 DIAGNOSIS — E1169 Type 2 diabetes mellitus with other specified complication: Secondary | ICD-10-CM | POA: Diagnosis not present

## 2015-01-01 DIAGNOSIS — T65221A Toxic effect of tobacco cigarettes, accidental (unintentional), initial encounter: Secondary | ICD-10-CM | POA: Diagnosis not present

## 2015-02-22 ENCOUNTER — Encounter (HOSPITAL_COMMUNITY): Payer: Self-pay

## 2015-02-22 DIAGNOSIS — Z79899 Other long term (current) drug therapy: Secondary | ICD-10-CM | POA: Insufficient documentation

## 2015-02-22 DIAGNOSIS — R011 Cardiac murmur, unspecified: Secondary | ICD-10-CM | POA: Insufficient documentation

## 2015-02-22 DIAGNOSIS — I10 Essential (primary) hypertension: Secondary | ICD-10-CM | POA: Insufficient documentation

## 2015-02-22 DIAGNOSIS — J45901 Unspecified asthma with (acute) exacerbation: Secondary | ICD-10-CM | POA: Diagnosis not present

## 2015-02-22 DIAGNOSIS — Z720 Tobacco use: Secondary | ICD-10-CM | POA: Diagnosis not present

## 2015-02-22 DIAGNOSIS — R05 Cough: Secondary | ICD-10-CM | POA: Diagnosis not present

## 2015-02-22 DIAGNOSIS — E119 Type 2 diabetes mellitus without complications: Secondary | ICD-10-CM | POA: Diagnosis not present

## 2015-02-22 MED ORDER — ALBUTEROL SULFATE (2.5 MG/3ML) 0.083% IN NEBU
INHALATION_SOLUTION | RESPIRATORY_TRACT | Status: AC
  Filled 2015-02-22: qty 6

## 2015-02-22 MED ORDER — ALBUTEROL SULFATE (2.5 MG/3ML) 0.083% IN NEBU
5.0000 mg | INHALATION_SOLUTION | Freq: Once | RESPIRATORY_TRACT | Status: AC
  Administered 2015-02-22: 5 mg via RESPIRATORY_TRACT

## 2015-02-22 NOTE — ED Notes (Addendum)
Onset 2 weeks cough, coughing up scant amount yellow phlegm at times.  Pt has increased Albuterol. iinhaler use from BID to QID in the past two weeks.  Pt c/o shortness of breath, talking in complete sentences.

## 2015-02-23 ENCOUNTER — Emergency Department (HOSPITAL_COMMUNITY)
Admission: EM | Admit: 2015-02-23 | Discharge: 2015-02-23 | Disposition: A | Discharge status: Home / Self Care | Payer: BLUE CROSS/BLUE SHIELD | Attending: Emergency Medicine | Admitting: Emergency Medicine

## 2015-02-23 ENCOUNTER — Emergency Department (HOSPITAL_COMMUNITY): Payer: BLUE CROSS/BLUE SHIELD

## 2015-02-23 DIAGNOSIS — R05 Cough: Secondary | ICD-10-CM

## 2015-02-23 DIAGNOSIS — R059 Cough, unspecified: Secondary | ICD-10-CM

## 2015-02-23 MED ORDER — PREDNISONE 50 MG PO TABS: 50.0000 mg | ORAL_TABLET | Freq: Every day | ORAL | Status: DC

## 2015-02-23 MED ORDER — HYDROCODONE-ACETAMINOPHEN 5-325 MG PO TABS
1.0000 | ORAL_TABLET | ORAL | Status: DC | PRN
Start: 2015-02-23 — End: 2016-08-22

## 2015-02-23 MED ORDER — PREDNISONE 20 MG PO TABS
60.0000 mg | ORAL_TABLET | Freq: Once | ORAL | Status: AC
  Administered 2015-02-23: 60 mg via ORAL
  Filled 2015-02-23: qty 3

## 2015-02-23 MED ORDER — IPRATROPIUM-ALBUTEROL 0.5-2.5 (3) MG/3ML IN SOLN
3.0000 mL | Freq: Once | RESPIRATORY_TRACT | Status: AC
  Administered 2015-02-23: 3 mL via RESPIRATORY_TRACT
  Filled 2015-02-23: qty 3

## 2015-02-23 NOTE — ED Notes (Signed)
Pt verbalized understanding of d/c instructions and no further questions. Pt stable  And NAD

## 2015-02-23 NOTE — ED Provider Notes (Signed)
CSN: [id number]     Arrival date & time 02/22/15  2220 History  This chart was scribed for Dione Booze, MD by Leone Payor, ED Scribe. This patient was seen in room A03C/A03C and the patient's care was started 3:00 AM.    Chief Complaint  Patient presents with  . Cough   The history is provided by the patient. No language interpreter was used.     HPI Comments: Kristy Salazar is a 74 y.o. female who presents to the Emergency Department complaining of intermittent, unchanged cough for the past 2 weeks. She describes the cough as "hacking" and is productive of yellow phlegm. She reports associated occasional SOB after a coughing episode. She states the coughing is worse at night. She has increased albuterol inhaler use along with Singulair. She denies fever, chills, diaphoresis, chest pain. She is a current every day smoker.   Past Medical History  Diagnosis Date  . Hypertension   . Diabetes mellitus   . Seasonal allergies   . Asthma    Past Surgical History  Procedure Laterality Date  . Joint replacement    . Appendectomy    . Abdominal hysterectomy    . Back surgery    . Total knee arthroplasty    . Total hip arthroplasty     History reviewed. No pertinent family history. Social History  Substance Use Topics  . Smoking status: Current Every Day Smoker  . Smokeless tobacco: None  . Alcohol Use: No   OB History    No data available     Review of Systems  Constitutional: Negative for fever, chills and diaphoresis.  Respiratory: Positive for cough and shortness of breath.   Cardiovascular: Negative for chest pain.  All other systems reviewed and are negative.     Allergies  Erythromycin  Home Medications   Prior to Admission medications   Medication Sig Start Date End Date Taking? Authorizing Provider  Amlodipine Besylate-Valsartan (EXFORGE PO) Take by mouth.    Historical Provider, MD  ferrous sulfate 325 (65 FE) MG tablet Take 1 tablet (325 mg total) by mouth 3  (three) times daily with meals. 05/16/11 05/15/12  Ranee Gosselin, MD  metFORMIN (GLUCOPHAGE) 500 MG tablet Take 500 mg by mouth at bedtime.      Historical Provider, MD  montelukast (SINGULAIR) 10 MG tablet Take 10 mg by mouth daily as needed. For seasonal allergies     Historical Provider, MD  niacin (NIASPAN) 500 MG CR tablet Take 500 mg by mouth at bedtime.      Historical Provider, MD  rosuvastatin (CRESTOR) 10 MG tablet Take 10 mg by mouth daily.      Historical Provider, MD  varenicline (CHANTIX PAK) 0.5 MG X 11 & 1 MG X 42 tablet Take 1 mg by mouth 2 (two) times daily. Take one 0.5mg  tablet by mouth once daily for 3 days, then increase to one 0.5mg  tablet twice daily for 4 days, then increase to one 1mg  tablet twice daily for 7 days     Historical Provider, MD  warfarin (COUMADIN) 5 MG tablet Take 1 tablet (5 mg total) by mouth one time only at 6 PM. 05/16/11 05/15/12  Ranee Gosselin, MD   BP 146/73 mmHg  Pulse 74  Temp(Src) 97.6 F (36.4 C) (Oral)  Resp 18  Ht 5\' 10"  (1.778 m)  Wt 138 lb 6.4 oz (62.778 kg)  BMI 19.86 kg/m2  SpO2 95% Physical Exam  Constitutional: She is oriented to person, place, and  time. She appears well-developed and well-nourished.  HENT:  Head: Normocephalic and atraumatic.  Eyes: Pupils are equal, round, and reactive to light.  Neck: Normal range of motion. Neck supple. No JVD present.  Cardiovascular: Normal rate and regular rhythm.   Murmur heard. Pulmonary/Chest: Effort normal. No respiratory distress. She has wheezes. She has no rales. She exhibits no tenderness.  Scattered wheezes bilaterally.   Abdominal: Soft. She exhibits no distension and no mass. There is no tenderness.  Musculoskeletal: Normal range of motion. She exhibits no edema.  Lymphadenopathy:    She has no cervical adenopathy.  Neurological: She is alert and oriented to person, place, and time. No cranial nerve deficit. She exhibits normal muscle tone. Coordination normal.  Skin: Skin is  warm and dry. No rash noted.  Psychiatric: She has a normal mood and affect. Her behavior is normal. Judgment and thought content normal.  Nursing note and vitals reviewed.   ED Course  Procedures (including critical care time)  DIAGNOSTIC STUDIES: Oxygen Saturation is 97% on RA, adequate by my interpretation.    COORDINATION OF CARE: 3:04 AM Discussed treatment plan with pt at bedside and pt agreed to plan.   Imaging Review Dg Chest 2 View  02/23/2015   CLINICAL DATA:  Subacute onset of cough, congestion and shortness of breath. Initial encounter.  EXAM: CHEST  2 VIEW  COMPARISON:  Chest radiograph from 05/10/2011  FINDINGS: The lungs are well-aerated. Minimal bibasilar atelectasis is noted. Mild peribronchial thickening is seen. There is no evidence of pleural effusion or pneumothorax.  The heart is normal in size; the mediastinal contour is within normal limits. No acute osseous abnormalities are seen.  IMPRESSION: Minimal bibasilar atelectasis noted. Mild peribronchial thickening seen.   Electronically Signed   By: Roanna Raider M.D.   On: 02/23/2015 02:52   I, Dione Booze, MD, personally reviewed and evaluated these images as part of my medical decision-making.   MDM   Final diagnoses:  Cough    Persistent cough. Exam shows wheezing consistent with bronchitis/asthma. She was still having some wheezing in spite of having had nebulizer treatment prior to my seeing her. She is given a dose of prednisone and given a second albuterol with ipratropium nebulizer treatment following which she states the cough is much improved and on reexam, lungs are clear. Review of past records shows prior visit with diagnosis of bronchitis and asthma. She is discharged with prescription for prednisone. Her cough tends to bother her at night which is getting in the way of sleeping. She's given a prescription for hydrocodone-acetaminophen to use as a cough suppressant at night so that she can sleep.  I  personally performed the services described in this documentation, which was scribed in my presence. The recorded information has been reviewed and is accurate.    Dione Booze, MD 02/23/15 2223

## 2015-02-23 NOTE — Discharge Instructions (Signed)
Continue to use your ProAir inhaler every four hours as needed.  Cough, Adult  A cough is a reflex that helps clear your throat and airways. It can help heal the body or may be a reaction to an irritated airway. A cough may only last 2 or 3 weeks (acute) or may last more than 8 weeks (chronic).  CAUSES Acute cough:  Viral or bacterial infections. Chronic cough:  Infections.  Allergies.  Asthma.  Post-nasal drip.  Smoking.  Heartburn or acid reflux.  Some medicines.  Chronic lung problems (COPD).  Cancer. SYMPTOMS   Cough.  Fever.  Chest pain.  Increased breathing rate.  High-pitched whistling sound when breathing (wheezing).  Colored mucus that you cough up (sputum). TREATMENT   A bacterial cough may be treated with antibiotic medicine.  A viral cough must run its course and will not respond to antibiotics.  Your caregiver may recommend other treatments if you have a chronic cough. HOME CARE INSTRUCTIONS   Only take over-the-counter or prescription medicines for pain, discomfort, or fever as directed by your caregiver. Use cough suppressants only as directed by your caregiver.  Use a cold steam vaporizer or humidifier in your bedroom or home to help loosen secretions.  Sleep in a semi-upright position if your cough is worse at night.  Rest as needed.  Stop smoking if you smoke. SEEK IMMEDIATE MEDICAL CARE IF:   You have pus in your sputum.  Your cough starts to worsen.  You cannot control your cough with suppressants and are losing sleep.  You begin coughing up blood.  You have difficulty breathing.  You develop pain which is getting worse or is uncontrolled with medicine.  You have a fever. MAKE SURE YOU:   Understand these instructions.  Will watch your condition.  Will get help right away if you are not doing well or get worse. Document Released: 12/25/2010 Document Revised: 09/20/2011 Document Reviewed: 12/25/2010 [facility] Patient  Information 2015 [place], [place]. This information is not intended to replace advice given to you by your health care provider. Make sure you discuss any questions you have with your health care provider.  Acetaminophen; Hydrocodone tablets or capsules What is this medicine? ACETAMINOPHEN; HYDROCODONE (a set a MEE noe fen; hye droe KOE done) is a pain reliever. It is used to treat mild to moderate pain. This medicine may be used for other purposes; ask your health care provider or pharmacist if you have questions. COMMON BRAND NAME(S): Anexsia, Bancap HC, Ceta-Plus, Co-Gesic, Comfortpak, Dolagesic, [employer], [address], [address], Hydrogesic, Sylvarena, Lorcet HD, Lorcet Plus, Lortab, Margesic H, Maxidone, [place], Polygesic, [place], [place], [employer], Vicodin, Vicodin ES, Vicodin HP, [place] [employer]eman What should I tell my health care provider before I take this medicine? They need to know if you have any of these conditions: -brain tumor -Crohn's disease, inflammatory bowel disease, or ulcerative colitis -drug abuse or addiction -head injury -heart or circulation problems -if you often drink alcohol -kidney disease or problems going to the bathroom -liver disease -lung disease, asthma, or breathing problems -an unusual or allergic reaction to acetaminophen, hydrocodone, other opioid analgesics, other medicines, foods, dyes, or preservatives -pregnant or trying to get pregnant -breast-feeding How should I use this medicine? Take this medicine by mouth. Swallow it with a full glass of water. Follow the directions on the prescription label. If the medicine upsets your stomach, take the medicine with food or milk. Do not take more than you are told to take. Talk to your pediatrician regarding  the use of this medicine in children. This medicine is not approved for use in children. Overdosage: If you think you have taken too much of this medicine contact a poison control center or emergency room at  once. NOTE: This medicine is only for you. Do not share this medicine with others. What if I miss a dose? If you miss a dose, take it as soon as you can. If it is almost time for your next dose, take only that dose. Do not take double or extra doses. What may interact with this medicine? -alcohol -antihistamines -isoniazid -medicines for depression, anxiety, or psychotic disturbances -medicines for sleep -muscle relaxants -naltrexone -narcotic medicines (opiates) for pain -phenobarbital -ritonavir -tramadol This list may not describe all possible interactions. Give your health care provider a list of all the medicines, herbs, non-prescription drugs, or dietary supplements you use. Also tell them if you smoke, drink alcohol, or use illegal drugs. Some items may interact with your medicine. What should I watch for while using this medicine? Tell your doctor or health care professional if your pain does not go away, if it gets worse, or if you have new or a different type of pain. You may develop tolerance to the medicine. Tolerance means that you will need a higher dose of the medicine for pain relief. Tolerance is normal and is expected if you take the medicine for a long time. Do not suddenly stop taking your medicine because you may develop a severe reaction. Your body becomes used to the medicine. This does NOT mean you are addicted. Addiction is a behavior related to getting and using a drug for a non-medical reason. If you have pain, you have a medical reason to take pain medicine. Your doctor will tell you how much medicine to take. If your doctor wants you to stop the medicine, the dose will be slowly lowered over time to avoid any side effects. You may get drowsy or dizzy when you first start taking the medicine or change doses. Do not drive, use machinery, or do anything that may be dangerous until you know how the medicine affects you. Stand or sit up slowly. There are different types  of narcotic medicines (opiates) for pain. If you take more than one type at the same time, you may have more side effects. Give your health care provider a list of all medicines you use. Your doctor will tell you how much medicine to take. Do not take more medicine than directed. Call emergency for help if you have problems breathing. The medicine will cause constipation. Try to have a bowel movement at least every 2 to 3 days. If you do not have a bowel movement for 3 days, call your doctor or health care professional. Too much acetaminophen can be very dangerous. Do not take Tylenol (acetaminophen) or medicines that contain acetaminophen with this medicine. Many non-prescription medicines contain acetaminophen. Always read the labels carefully. What side effects may I notice from receiving this medicine? Side effects that you should report to your doctor or health care professional as soon as possible: -allergic reactions like skin rash, itching or hives, swelling of the face, lips, or tongue -breathing problems -confusion -feeling faint or lightheaded, falls -stomach pain -yellowing of the eyes or skin Side effects that usually do not require medical attention (report to your doctor or health care professional if they continue or are bothersome): -nausea, vomiting -stomach upset This list may not describe all possible side effects. Call your doctor  for medical advice about side effects. You may report side effects to FDA at 1-800-FDA-1088. Where should I keep my medicine? Keep out of the reach of children. This medicine can be abused. Keep your medicine in a safe place to protect it from theft. Do not share this medicine with anyone. Selling or giving away this medicine is dangerous and against the law. Store at room temperature between 15 and 30 degrees C (59 and 86 degrees F). Protect from light. Keep container tightly closed. Throw away any unused medicine after the expiration date. Discard  unused medicine and used packaging carefully. Pets and children can be harmed if they find used or lost packages. NOTE: This sheet is a summary. It may not cover all possible information. If you have questions about this medicine, talk to your doctor, pharmacist, or health care provider.  2015, Elsevier/Gold Standard. (2013-02-19 13:15:56)  Prednisone tablets What is this medicine? PREDNISONE (PRED ni sone) is a corticosteroid. It is commonly used to treat inflammation of the skin, joints, lungs, and other organs. Common conditions treated include asthma, allergies, and arthritis. It is also used for other conditions, such as blood disorders and diseases of the adrenal glands. This medicine may be used for other purposes; ask your health care provider or pharmacist if you have questions. COMMON BRAND NAME(S): Deltasone, Predone, Sterapred, Sterapred DS What should I tell my health care provider before I take this medicine? They need to know if you have any of these conditions: -Cushing's syndrome -diabetes -glaucoma -heart disease -high blood pressure -infection (especially a virus infection such as chickenpox, cold sores, or herpes) -kidney disease -liver disease -mental illness -myasthenia gravis -osteoporosis -seizures -stomach or intestine problems -thyroid disease -an unusual or allergic reaction to lactose, prednisone, other medicines, foods, dyes, or preservatives -pregnant or trying to get pregnant -breast-feeding How should I use this medicine? Take this medicine by mouth with a glass of water. Follow the directions on the prescription label. Take this medicine with food. If you are taking this medicine once a day, take it in the morning. Do not take more medicine than you are told to take. Do not suddenly stop taking your medicine because you may develop a severe reaction. Your doctor will tell you how much medicine to take. If your doctor wants you to stop the medicine, the  dose may be slowly lowered over time to avoid any side effects. Talk to your pediatrician regarding the use of this medicine in children. Special care may be needed. Overdosage: If you think you have taken too much of this medicine contact a poison control center or emergency room at once. NOTE: This medicine is only for you. Do not share this medicine with others. What if I miss a dose? If you miss a dose, take it as soon as you can. If it is almost time for your next dose, talk to your doctor or health care professional. You may need to miss a dose or take an extra dose. Do not take double or extra doses without advice. What may interact with this medicine? Do not take this medicine with any of the following medications: -metyrapone -mifepristone This medicine may also interact with the following medications: -aminoglutethimide -amphotericin B -aspirin and aspirin-like medicines -barbiturates -certain medicines for diabetes, like glipizide or glyburide -cholestyramine -cholinesterase inhibitors -cyclosporine -digoxin -diuretics -ephedrine -female hormones, like estrogens and birth control pills -isoniazid -ketoconazole -NSAIDS, medicines for pain and inflammation, like ibuprofen or naproxen -phenytoin -rifampin -toxoids -vaccines -warfarin This  list may not describe all possible interactions. Give your health care provider a list of all the medicines, herbs, non-prescription drugs, or dietary supplements you use. Also tell them if you smoke, drink alcohol, or use illegal drugs. Some items may interact with your medicine. What should I watch for while using this medicine? Visit your doctor or health care professional for regular checks on your progress. If you are taking this medicine over a prolonged period, carry an identification card with your name and address, the type and dose of your medicine, and your doctor's name and address. This medicine may increase your risk of  getting an infection. Tell your doctor or health care professional if you are around anyone with measles or chickenpox, or if you develop sores or blisters that do not heal properly. If you are going to have surgery, tell your doctor or health care professional that you have taken this medicine within the last twelve months. Ask your doctor or health care professional about your diet. You may need to lower the amount of salt you eat. This medicine may affect blood sugar levels. If you have diabetes, check with your doctor or health care professional before you change your diet or the dose of your diabetic medicine. What side effects may I notice from receiving this medicine? Side effects that you should report to your doctor or health care professional as soon as possible: -allergic reactions like skin rash, itching or hives, swelling of the face, lips, or tongue -changes in emotions or moods -changes in vision -depressed mood -eye pain -fever or chills, cough, sore throat, pain or difficulty passing urine -increased thirst -swelling of ankles, feet Side effects that usually do not require medical attention (report to your doctor or health care professional if they continue or are bothersome): -confusion, excitement, restlessness -headache -nausea, vomiting -skin problems, acne, thin and shiny skin -trouble sleeping -weight gain This list may not describe all possible side effects. Call your doctor for medical advice about side effects. You may report side effects to FDA at 1-800-FDA-1088. Where should I keep my medicine? Keep out of the reach of children. Store at room temperature between 15 and 30 degrees C (59 and 86 degrees F). Protect from light. Keep container tightly closed. Throw away any unused medicine after the expiration date. NOTE: This sheet is a summary. It may not cover all possible information. If you have questions about this medicine, talk to your doctor, pharmacist, or  health care provider.  2015, Elsevier/Gold Standard. (2011-02-11 10:57:14)

## 2015-06-17 DIAGNOSIS — Z6820 Body mass index (BMI) 20.0-20.9, adult: Secondary | ICD-10-CM | POA: Diagnosis not present

## 2015-06-17 DIAGNOSIS — J449 Chronic obstructive pulmonary disease, unspecified: Secondary | ICD-10-CM | POA: Diagnosis not present

## 2015-06-21 DIAGNOSIS — Z6820 Body mass index (BMI) 20.0-20.9, adult: Secondary | ICD-10-CM | POA: Diagnosis not present

## 2015-06-21 DIAGNOSIS — J441 Chronic obstructive pulmonary disease with (acute) exacerbation: Secondary | ICD-10-CM | POA: Diagnosis not present

## 2015-07-31 DIAGNOSIS — J441 Chronic obstructive pulmonary disease with (acute) exacerbation: Secondary | ICD-10-CM | POA: Diagnosis not present

## 2015-10-06 DIAGNOSIS — J449 Chronic obstructive pulmonary disease, unspecified: Secondary | ICD-10-CM | POA: Diagnosis not present

## 2015-10-30 DIAGNOSIS — J441 Chronic obstructive pulmonary disease with (acute) exacerbation: Secondary | ICD-10-CM | POA: Diagnosis not present

## 2015-10-30 DIAGNOSIS — E1169 Type 2 diabetes mellitus with other specified complication: Secondary | ICD-10-CM | POA: Diagnosis not present

## 2015-10-30 DIAGNOSIS — E785 Hyperlipidemia, unspecified: Secondary | ICD-10-CM | POA: Diagnosis not present

## 2015-12-15 DIAGNOSIS — M25511 Pain in right shoulder: Secondary | ICD-10-CM | POA: Diagnosis not present

## 2015-12-26 DIAGNOSIS — M25511 Pain in right shoulder: Secondary | ICD-10-CM | POA: Diagnosis not present

## 2015-12-30 DIAGNOSIS — M75100 Unspecified rotator cuff tear or rupture of unspecified shoulder, not specified as traumatic: Secondary | ICD-10-CM | POA: Diagnosis not present

## 2015-12-30 DIAGNOSIS — M130 Polyarthritis, unspecified: Secondary | ICD-10-CM | POA: Diagnosis not present

## 2016-01-05 DIAGNOSIS — M75101 Unspecified rotator cuff tear or rupture of right shoulder, not specified as traumatic: Secondary | ICD-10-CM | POA: Diagnosis not present

## 2016-01-28 DIAGNOSIS — Z01818 Encounter for other preprocedural examination: Secondary | ICD-10-CM | POA: Diagnosis not present

## 2016-02-05 DIAGNOSIS — M130 Polyarthritis, unspecified: Secondary | ICD-10-CM | POA: Diagnosis not present

## 2016-02-05 DIAGNOSIS — J441 Chronic obstructive pulmonary disease with (acute) exacerbation: Secondary | ICD-10-CM | POA: Diagnosis not present

## 2016-02-05 DIAGNOSIS — I10 Essential (primary) hypertension: Secondary | ICD-10-CM | POA: Diagnosis not present

## 2016-02-05 DIAGNOSIS — R9431 Abnormal electrocardiogram [ECG] [EKG]: Secondary | ICD-10-CM | POA: Diagnosis not present

## 2016-02-20 DIAGNOSIS — M75121 Complete rotator cuff tear or rupture of right shoulder, not specified as traumatic: Secondary | ICD-10-CM | POA: Diagnosis not present

## 2016-02-20 DIAGNOSIS — M7541 Impingement syndrome of right shoulder: Secondary | ICD-10-CM | POA: Diagnosis not present

## 2016-02-20 DIAGNOSIS — G8918 Other acute postprocedural pain: Secondary | ICD-10-CM | POA: Diagnosis not present

## 2016-03-04 ENCOUNTER — Ambulatory Visit (INDEPENDENT_AMBULATORY_CARE_PROVIDER_SITE_OTHER): Payer: BLUE CROSS/BLUE SHIELD | Admitting: Internal Medicine

## 2016-03-04 ENCOUNTER — Encounter: Payer: Self-pay | Admitting: Internal Medicine

## 2016-03-04 ENCOUNTER — Ambulatory Visit (INDEPENDENT_AMBULATORY_CARE_PROVIDER_SITE_OTHER)
Admission: RE | Admit: 2016-03-04 | Discharge: 2016-03-04 | Disposition: A | Discharge status: Home / Self Care | Payer: BLUE CROSS/BLUE SHIELD | Source: Ambulatory Visit | Attending: Internal Medicine | Admitting: Internal Medicine

## 2016-03-04 VITALS — BP 124/78 | HR 80 | Ht 70.5 in | Wt 139.0 lb

## 2016-03-04 DIAGNOSIS — F17210 Nicotine dependence, cigarettes, uncomplicated: Secondary | ICD-10-CM

## 2016-03-04 DIAGNOSIS — Z720 Tobacco use: Secondary | ICD-10-CM | POA: Diagnosis not present

## 2016-03-04 DIAGNOSIS — M25511 Pain in right shoulder: Secondary | ICD-10-CM | POA: Diagnosis not present

## 2016-03-04 DIAGNOSIS — J449 Chronic obstructive pulmonary disease, unspecified: Secondary | ICD-10-CM

## 2016-03-04 NOTE — Patient Instructions (Signed)
Work on inhaler technique:  relax and gently blow all the way out then take a nice smooth deep breath back in, triggering the inhaler at same time you start breathing in.  Hold for up to 5 seconds if you can. Blow out thru nose. Rinse and gargle with water when done      Only use your albuterol as a rescue medication to be used if you can't catch your breath by resting or doing a relaxed purse lip breathing pattern.  - The less you use it, the better it will work when you need it. - Ok to use up to 2 puffs  every 4 hours if you must but call for immediate appointment if use goes up over your usual need - Don't leave home without it !!  (think of it like the spare tire for your car)   Either use spiriva perfectly regularly or leave it off and see how you feel off it as you do not have significant copd and you never will if you stop smoking now  Please remember to go to the  x-ray department downstairs for your tests - we will call you with the results when they are available.  Pulmonary follow up is as needed

## 2016-03-04 NOTE — Assessment & Plan Note (Addendum)
Spirometry 03/04/2016  FEV1 1.63  (68%)  Ratio 74 with mild curvature    So she doesn't actually meet the criteria for copd and is a GROUP A in terms of symptoms and risk so spiriva is optional and the key at this point is smoking cessation (see separate a/p)   - The proper method of use, as well as anticipated side effects, of a metered-dose inhaler are discussed and demonstrated to the patient. Improved effectiveness after extensive coaching during this visit to a level of approximately 75 % from a baseline of 50 % > ok to use proair hfa but also may    Total time devoted to counseling  = 35/1934m review case with pt/ discussion of options/alternatives/ personally creating written instructions  in presence of pt  then going over those specific  Instructions directly with the pt including how to use all of the meds but in particular covering each new medication in detail and the difference between the maintenance/automatic meds and the prns using an action plan format for the latter.

## 2016-03-04 NOTE — Progress Notes (Signed)
Spoke with pt and notified of results per Dr. Wert. Pt verbalized understanding and denied any questions. 

## 2016-03-04 NOTE — Progress Notes (Signed)
Subjective:     Patient ID: Kristy Salazar, female   DOB: 08-24-1940,    MRN: [id number]  HPI   [id number] [id number] active smoker with onset variable sob x 2003 referred to pulmonary clinic 03/04/2016 by Dr Renaye RakersVeita Bland re ? Copd but did not meet the criteria on initial eval   03/04/2016 1st [facility] office visit/ [facility]   Chief Complaint  Patient presents with  . Pulmonary Consult    Referred by Dr. Renaye RakersVeita Bland for COPD eval. Pt denies any respiratory symptoms at this time. She states that she has some diff with breathing due to allergies only in the spring time.   maint on spiriva/ singulair  and prn saba  Other than issues each spring no obvious day to day or daytime variability or assoc excess/ purulent sputum or mucus plugs or hemoptysis or cp or chest tightness, subjective wheeze or overt sinus or hb symptoms. No unusual exp hx or h/o childhood pna/ asthma or knowledge of premature birth.  Sleeping ok without nocturnal  or early am exacerbation  of respiratory  c/o's or need for noct saba. Also denies any obvious fluctuation of symptoms with weather or environmental changes or other aggravating or alleviating factors except as outlined above   Current Medications, Allergies, Complete Past Medical History, Past Surgical History, Family History, and Social History were reviewed in [facility] electronic medical record.  ROS  The following are not active complaints unless bolded sore throat, dysphagia, dental problems, itching, sneezing,  nasal congestion or excess/ purulent secretions, ear ache,   fever, chills, sweats, unintended wt loss, classically pleuritic or exertional cp,  orthopnea pnd or leg swelling, presyncope, palpitations, abdominal pain, anorexia, nausea, vomiting, diarrhea  or change in bowel or bladder habits, change in stools or urine, dysuria,hematuria,  rash, arthralgias, visual complaints, headache, numbness, weakness or ataxia or problems with walking or coordination,   change in mood/affect or memory.        Review of Systems     Objective:   Physical Exam amb bf R arm sling  Wt Readings from Last 3 Encounters:  03/04/16 139 lb (63 kg)  02/22/15 138 lb 6.4 oz (62.8 kg)  05/13/11 165 lb 2 oz (74.9 kg)    Vital signs reviewed   HEENT: nl dentition, turbinates, and oropharynx. Nl external ear canals without cough reflex   NECK :  without JVD/Nodes/TM/ nl carotid upstrokes bilaterally   LUNGS: no acc muscle use,  Nl contour chest which is clear to A and P bilaterally without cough on insp or exp maneuvers   CV:  RRR  no s3 or murmur or increase in P2, no edema   ABD:  soft and nontender with nl inspiratory excursion in the supine position. No bruits or organomegaly, bowel sounds nl  MS:  Nl gait/ ext warm without deformities, calf tenderness, cyanosis or clubbing No obvious joint restrictions   SKIN: warm and dry without lesions    NEURO:  alert, approp, nl sensorium with  no motor deficits     CXR PA and Lateral:   03/04/2016 :    I personally reviewed images and agree with radiology impression as follows:    No active cardiopulmonary disease. Aortic atherosclerosis. Hyperexpansion the lungs suggesting chronic obstructive pulmonary disease.    Assessment:

## 2016-03-04 NOTE — Assessment & Plan Note (Signed)

## 2016-03-05 ENCOUNTER — Encounter: Payer: Self-pay | Admitting: Internal Medicine

## 2016-03-08 DIAGNOSIS — M25511 Pain in right shoulder: Secondary | ICD-10-CM | POA: Diagnosis not present

## 2016-03-11 DIAGNOSIS — M25511 Pain in right shoulder: Secondary | ICD-10-CM | POA: Diagnosis not present

## 2016-03-16 DIAGNOSIS — H2513 Age-related nuclear cataract, bilateral: Secondary | ICD-10-CM | POA: Diagnosis not present

## 2016-03-16 DIAGNOSIS — M25511 Pain in right shoulder: Secondary | ICD-10-CM | POA: Diagnosis not present

## 2016-03-19 DIAGNOSIS — H25812 Combined forms of age-related cataract, left eye: Secondary | ICD-10-CM | POA: Diagnosis not present

## 2016-03-19 DIAGNOSIS — H25811 Combined forms of age-related cataract, right eye: Secondary | ICD-10-CM | POA: Diagnosis not present

## 2016-03-19 DIAGNOSIS — M25511 Pain in right shoulder: Secondary | ICD-10-CM | POA: Diagnosis not present

## 2016-03-24 DIAGNOSIS — M25511 Pain in right shoulder: Secondary | ICD-10-CM | POA: Diagnosis not present

## 2016-03-25 DIAGNOSIS — H2511 Age-related nuclear cataract, right eye: Secondary | ICD-10-CM | POA: Diagnosis not present

## 2016-03-25 DIAGNOSIS — H25811 Combined forms of age-related cataract, right eye: Secondary | ICD-10-CM | POA: Diagnosis not present

## 2016-03-26 DIAGNOSIS — H2513 Age-related nuclear cataract, bilateral: Secondary | ICD-10-CM | POA: Diagnosis not present

## 2016-04-01 DIAGNOSIS — H2513 Age-related nuclear cataract, bilateral: Secondary | ICD-10-CM | POA: Diagnosis not present

## 2016-04-21 DIAGNOSIS — J449 Chronic obstructive pulmonary disease, unspecified: Secondary | ICD-10-CM | POA: Diagnosis not present

## 2016-04-28 DIAGNOSIS — J441 Chronic obstructive pulmonary disease with (acute) exacerbation: Secondary | ICD-10-CM | POA: Diagnosis not present

## 2016-08-11 DIAGNOSIS — J441 Chronic obstructive pulmonary disease with (acute) exacerbation: Secondary | ICD-10-CM | POA: Diagnosis not present

## 2016-08-22 ENCOUNTER — Encounter (HOSPITAL_COMMUNITY): Payer: Self-pay | Admitting: Emergency Medicine

## 2016-08-22 ENCOUNTER — Ambulatory Visit (HOSPITAL_COMMUNITY)
Admission: EM | Admit: 2016-08-22 | Discharge: 2016-08-22 | Disposition: A | Discharge status: Home / Self Care | Payer: BLUE CROSS/BLUE SHIELD | Attending: Physician Assistant | Admitting: Physician Assistant

## 2016-08-22 DIAGNOSIS — R05 Cough: Secondary | ICD-10-CM

## 2016-08-22 DIAGNOSIS — J441 Chronic obstructive pulmonary disease with (acute) exacerbation: Secondary | ICD-10-CM | POA: Diagnosis not present

## 2016-08-22 DIAGNOSIS — I10 Essential (primary) hypertension: Secondary | ICD-10-CM

## 2016-08-22 DIAGNOSIS — R059 Cough, unspecified: Secondary | ICD-10-CM

## 2016-08-22 MED ORDER — IPRATROPIUM BROMIDE 0.02 % IN SOLN
RESPIRATORY_TRACT | Status: AC
  Filled 2016-08-22: qty 2.5

## 2016-08-22 MED ORDER — ALBUTEROL SULFATE (2.5 MG/3ML) 0.083% IN NEBU
INHALATION_SOLUTION | RESPIRATORY_TRACT | Status: AC
  Filled 2016-08-22: qty 6

## 2016-08-22 MED ORDER — IPRATROPIUM BROMIDE 0.02 % IN SOLN
0.5000 mg | Freq: Once | RESPIRATORY_TRACT | Status: AC
  Administered 2016-08-22: 0.5 mg via RESPIRATORY_TRACT

## 2016-08-22 MED ORDER — PREDNISONE 10 MG (48) PO TBPK: ORAL_TABLET | Freq: Every day | ORAL | 0 refills | Status: DC

## 2016-08-22 MED ORDER — ALBUTEROL SULFATE (2.5 MG/3ML) 0.083% IN NEBU
5.0000 mg | INHALATION_SOLUTION | Freq: Once | RESPIRATORY_TRACT | Status: AC
  Administered 2016-08-22: 5 mg via RESPIRATORY_TRACT

## 2016-08-22 NOTE — ED Triage Notes (Signed)
The patient presented to the [facility] with a complaint of a cough and SOB x 3 days.  

## 2016-08-22 NOTE — Discharge Instructions (Signed)
Nice to meet you. Prednisone pack will also help with your breathing and cough. Glad the breathing treatment was helpful. Continue with your inhaler 2 puffs every 6 hours x 48 hours then as needed. Delsym will also help with your cough as directed. Please keep your regular f/u with Dr. Parke SimmersBland. Feel better. FU for elevated BP

## 2016-08-22 NOTE — ED Provider Notes (Signed)
CSN: [id number]     Arrival date & time 08/22/16  1324 History   First MD Initiated Contact with Patient 08/22/16 1351     Chief Complaint  Patient presents with  . Cough   (Consider location/radiation/quality/duration/timing/severity/associated sxs/prior Treatment) 76 yo female with a history of HTN and COPD presents with dyspnea and cough. She started having symptoms over a week ago and was in to see her PCP, Dr. Parke SimmersBland. She was given an inhaler and cough syrup and was doing well until 3 days ago when her inhalers did not seem to be controlling her symptoms and therefore presented here. She has no fever or chills. No URI symptoms. Just feelings that she cannot catch her breath.  No chest pain is noted.       Past Medical History:  Diagnosis Date  . Asthma   . Diabetes mellitus   . Hypertension   . Seasonal allergies    Past Surgical History:  Procedure Laterality Date  . ABDOMINAL HYSTERECTOMY    . APPENDECTOMY    . BACK SURGERY    . JOINT REPLACEMENT    . TOTAL HIP ARTHROPLASTY    . TOTAL KNEE ARTHROPLASTY     History reviewed. No pertinent family history. Social History  Substance Use Topics  . Smoking status: Current Every Day Smoker    Packs/day: 0.50    Years: 50.00  . Smokeless tobacco: Never Used  . Alcohol use No   OB History    No data available     Review of Systems  Constitutional: Negative for chills and fever.  HENT: Negative.   Eyes: Negative.   Respiratory: Positive for cough, shortness of breath and wheezing.   Cardiovascular: Negative.   Skin: Negative.   Allergic/Immunologic: Positive for environmental allergies.  Neurological: Negative.   Psychiatric/Behavioral: Negative.     Allergies  Erythromycin  Home Medications   Prior to Admission medications   Medication Sig Start Date End Date Taking? Authorizing Provider  albuterol (PROVENTIL HFA;VENTOLIN HFA) 108 (90 BASE) MCG/ACT inhaler Inhale 1 puff into the lungs every 6 (six) hours as  needed for wheezing or shortness of breath.   Yes Historical Provider, MD  montelukast (SINGULAIR) 10 MG tablet Take 10 mg by mouth daily. For seasonal allergies   Yes Historical Provider, MD  rosuvastatin (CRESTOR) 10 MG tablet Take 10 mg by mouth daily.     Yes Historical Provider, MD  tiotropium (SPIRIVA) 18 MCG inhalation capsule Place 18 mcg into inhaler and inhale daily.   Yes Historical Provider, MD  predniSONE (STERAPRED UNI-PAK 48 TAB) 10 MG (48) TBPK tablet Take by mouth daily. For breathing and cough 08/22/16   Riki SheerMichelle G Anu Stagner, PA-C   Meds Ordered and Administered this Visit   Medications  albuterol (PROVENTIL) (2.5 MG/3ML) 0.083% nebulizer solution 5 mg (5 mg Nebulization Given 08/22/16 1352)  ipratropium (ATROVENT) nebulizer solution 0.5 mg (0.5 mg Nebulization Given 08/22/16 1353)    BP (!) 143/103 (BP Location: Right Arm)   Pulse 88   Temp 98 F (36.7 C) (Oral)   Resp 22   SpO2 97%  No data found.   Physical Exam  Constitutional: She is oriented to person, place, and time. She appears well-developed and well-nourished. No distress.  Speaking in full sentences without use of accessory muscles to breathe  HENT:  Head: Normocephalic and atraumatic.  Mouth/Throat: Oropharynx is clear and moist. No oropharyngeal exudate.  Eyes: Pupils are equal, round, and reactive to light.  Neck: Normal range  of motion.  Cardiovascular: Normal rate and regular rhythm.   Pulmonary/Chest: Effort normal. She has wheezes. She has no rales.  Sats at 97%, following breathing treatment with mild expiratory wheeze and rhonchi throughout, no crackles are noted  Lymphadenopathy:    She has no cervical adenopathy.  Neurological: She is alert and oriented to person, place, and time.  Skin: Skin is warm and dry. She is not diaphoretic.  Psychiatric: Her behavior is normal.  Nursing note and vitals reviewed.   Urgent Care Course     Procedures (including critical care time)  Labs  Review Labs Reviewed - No data to display  Imaging Review No results found.   Visual Acuity Review  Right Eye Distance:   Left Eye Distance:   Bilateral Distance:    Right Eye Near:   Left Eye Near:    Bilateral Near:         MDM   1. COPD exacerbation (HCC)   2. Cough   3. Essential hypertension    Improved s/p duo neb. Stable for discharge. RX given for prednisone pack for bronchial inflammation with instructions to use the inhaler every 6 hours for 24 -48 hours while awake then return to PRN. Continue with Claritin as well. Keep regular f/u with Dr. Parke Simmers. If worsens then f/u here or in the ED.  FU with Dr. Parke Simmers for elevated BP.  Patient is stable for D/C.     Riki Sheer, PA-C 08/22/16 1423

## 2016-08-26 DIAGNOSIS — J441 Chronic obstructive pulmonary disease with (acute) exacerbation: Secondary | ICD-10-CM | POA: Diagnosis not present

## 2016-09-16 DIAGNOSIS — J441 Chronic obstructive pulmonary disease with (acute) exacerbation: Secondary | ICD-10-CM | POA: Diagnosis not present

## 2016-12-16 DIAGNOSIS — M130 Polyarthritis, unspecified: Secondary | ICD-10-CM | POA: Diagnosis not present

## 2016-12-16 DIAGNOSIS — E1169 Type 2 diabetes mellitus with other specified complication: Secondary | ICD-10-CM | POA: Diagnosis not present

## 2016-12-16 DIAGNOSIS — T65221D Toxic effect of tobacco cigarettes, accidental (unintentional), subsequent encounter: Secondary | ICD-10-CM | POA: Diagnosis not present

## 2016-12-16 DIAGNOSIS — E785 Hyperlipidemia, unspecified: Secondary | ICD-10-CM | POA: Diagnosis not present

## 2016-12-16 DIAGNOSIS — J449 Chronic obstructive pulmonary disease, unspecified: Secondary | ICD-10-CM | POA: Diagnosis not present

## 2016-12-16 DIAGNOSIS — I10 Essential (primary) hypertension: Secondary | ICD-10-CM | POA: Diagnosis not present

## 2017-04-08 DIAGNOSIS — H2513 Age-related nuclear cataract, bilateral: Secondary | ICD-10-CM | POA: Diagnosis not present

## 2017-04-15 DIAGNOSIS — J441 Chronic obstructive pulmonary disease with (acute) exacerbation: Secondary | ICD-10-CM | POA: Diagnosis not present

## 2017-04-15 DIAGNOSIS — M130 Polyarthritis, unspecified: Secondary | ICD-10-CM | POA: Diagnosis not present

## 2017-04-15 DIAGNOSIS — T65221D Toxic effect of tobacco cigarettes, accidental (unintentional), subsequent encounter: Secondary | ICD-10-CM | POA: Diagnosis not present

## 2017-04-18 DIAGNOSIS — Z23 Encounter for immunization: Secondary | ICD-10-CM | POA: Diagnosis not present

## 2017-08-22 ENCOUNTER — Ambulatory Visit (HOSPITAL_COMMUNITY)
Admission: EM | Admit: 2017-08-22 | Discharge: 2017-08-22 | Disposition: A | Discharge status: Home / Self Care | Payer: BLUE CROSS/BLUE SHIELD | Attending: Family Medicine | Admitting: Family Medicine

## 2017-08-22 ENCOUNTER — Encounter (HOSPITAL_COMMUNITY): Payer: Self-pay | Admitting: Emergency Medicine

## 2017-08-22 ENCOUNTER — Ambulatory Visit (INDEPENDENT_AMBULATORY_CARE_PROVIDER_SITE_OTHER): Payer: BLUE CROSS/BLUE SHIELD

## 2017-08-22 DIAGNOSIS — R062 Wheezing: Secondary | ICD-10-CM

## 2017-08-22 DIAGNOSIS — R0602 Shortness of breath: Secondary | ICD-10-CM

## 2017-08-22 DIAGNOSIS — R05 Cough: Secondary | ICD-10-CM | POA: Diagnosis not present

## 2017-08-22 DIAGNOSIS — J189 Pneumonia, unspecified organism: Secondary | ICD-10-CM

## 2017-08-22 DIAGNOSIS — J181 Lobar pneumonia, unspecified organism: Secondary | ICD-10-CM

## 2017-08-22 MED ORDER — ALBUTEROL SULFATE (2.5 MG/3ML) 0.083% IN NEBU
2.5000 mg | INHALATION_SOLUTION | Freq: Once | RESPIRATORY_TRACT | Status: AC
  Administered 2017-08-22: 2.5 mg via RESPIRATORY_TRACT

## 2017-08-22 MED ORDER — PREDNISONE 10 MG (21) PO TBPK: ORAL_TABLET | Freq: Every day | ORAL | 0 refills | Status: DC

## 2017-08-22 MED ORDER — DOXYCYCLINE HYCLATE 100 MG PO CAPS: 100.0000 mg | ORAL_CAPSULE | Freq: Two times a day (BID) | ORAL | 0 refills | Status: DC

## 2017-08-22 MED ORDER — ALBUTEROL SULFATE (2.5 MG/3ML) 0.083% IN NEBU
INHALATION_SOLUTION | RESPIRATORY_TRACT | Status: AC
  Filled 2017-08-22: qty 3

## 2017-08-22 NOTE — Discharge Instructions (Signed)
Please follow up with your primary care physician or here within 48-72 hours to ensure that you are improving. The radiologist who read your chest x-ray recommends that you have a repeat chest x-ray done in 3-4 weeks. Your primary doctor can schedule this for you.

## 2017-08-22 NOTE — ED Triage Notes (Signed)
PT reports cough and asthma exacerbation for 2 weeks. PT states she needs a breathing treatment

## 2017-08-22 NOTE — ED Provider Notes (Signed)
[facility]   [id number] 08/22/17 Arrival Time: 1006  ASSESSMENT & PLAN:  1. Wheezing   2. SOB (shortness of breath)   3. Community acquired pneumonia of left lower lobe of lung (HCC)    Wheezing improved after single nebulizer treatment.  Meds ordered this encounter  Medications  . albuterol (PROVENTIL) (2.5 MG/3ML) 0.083% nebulizer solution 2.5 mg  . predniSONE (STERAPRED UNI-PAK 21 TAB) 10 MG (21) TBPK tablet    Sig: Take by mouth daily. Take as directed.    Dispense:  21 tablet    Refill:  0  . doxycycline (VIBRAMYCIN) 100 MG capsule    Sig: Take 1 capsule (100 mg total) by mouth 2 (two) times daily.    Dispense:  20 capsule    Refill:  0   Imaging: Dg Chest 2 View  Result Date: 08/22/2017 CLINICAL DATA:  One week of cough, shortness of breath, and wheezing. History of asthma-COPD, current smoker. EXAM: CHEST  2 VIEW COMPARISON:  PA and lateral chest x-ray dated March 04, 2016 FINDINGS: The lungs remain hyperinflated with hemidiaphragm flattening and increased AP dimension of the thorax. The interstitial markings are coarse but stable. There is no pleural effusion. There is patchy increased density projecting over the anterior aspect of the left lower lobe. The heart and pulmonary vascularity are normal. There calcification in the wall of the aortic arch. There is multilevel degenerative disc disease of the thoracic spine. IMPRESSION: COPD. Patchy increased density in the anterior aspect of the left lower lobe may reflect early pneumonia. Followup PA and lateral chest X-ray is recommended in 3-4 weeks following trial of antibiotic therapy to ensure resolution and exclude underlying malignancy. Thoracic aortic atherosclerosis. Electronically Signed   By: David  [place] M.D.   On: 08/22/2017 11:21   Work note given. Recommend f/u here or with PCP in 48-72 hours to ensure that she is improving. Will f/u sooner if needed, ED if abrupt worsening.  Reviewed expectations re:  course of current medical issues. Questions answered. Outlined signs and symptoms indicating need for more acute intervention. Patient verbalized understanding. After Visit Summary given.   SUBJECTIVE: History from: patient. Kristy Salazar is a 77 y.o. female who presents with complaint of intermittent cough and wheezing. Reports gradual onset 2 weeks ago. Described symptoms have stabilized since beginning. No fever reported but she does feel more SOB over the past few days. No n/v. Albuterol inhaler with some temporary relief. Normal PO intake. No CP or back pain. Ambulatory without difficulty. Able to sleep through the night. No OTC treatment.  ROS: As per HPI.   OBJECTIVE:  Vitals:   08/22/17 1032 08/22/17 1037  BP: (!) 169/87   Pulse: 77   Resp: (!) 24   Temp: 97.8 F (36.6 C)   TempSrc: Oral   SpO2: 95%   Weight:  130 lb (59 kg)    General appearance: alert; no distress Eyes: PERRLA; EOMI; conjunctiva normal HENT: nasal congestion, mild Lungs: symmetrical air entry; recheck RR 20; bilateral exp wheezing; able to speak in full sentences Heart: regular rate and rhythm Abdomen: soft, non-tender Extremities: no cyanosis or edema Skin: warm and dry Psychological: alert and cooperative; normal mood and affect   Allergies  Allergen Reactions  . Erythromycin Hives    Past Medical History:  Diagnosis Date  . Asthma   . Diabetes mellitus   . Hypertension   . Seasonal allergies    Social History   Socioeconomic History  . Marital status:  Legally Separated    Spouse name: Not on file  . Number of children: Not on file  . Years of education: Not on file  . Highest education level: Not on file  Social Needs  . Financial resource strain: Not on file  . Food insecurity - worry: Not on file  . Food insecurity - inability: Not on file  . Transportation needs - medical: Not on file  . Transportation needs - non-medical: Not on file  Occupational History  . Not on  file  Tobacco Use  . Smoking status: Current Every Day Smoker    Packs/day: 0.50    Years: 50.00    Pack years: 25.00  . Smokeless tobacco: Never Used  Substance and Sexual Activity  . Alcohol use: No  . Drug use: No  . Sexual activity: Not on file  Other Topics Concern  . Not on file  Social History Narrative  . Not on file   FH: No lung disease.  Past Surgical History:  Procedure Laterality Date  . ABDOMINAL HYSTERECTOMY    . APPENDECTOMY    . BACK SURGERY    . JOINT REPLACEMENT    . TOTAL HIP ARTHROPLASTY    . TOTAL KNEE ARTHROPLASTY       Mardella LaymanHagler, Gerri Acre, MD 08/22/17 1233

## 2017-08-29 DIAGNOSIS — J189 Pneumonia, unspecified organism: Secondary | ICD-10-CM | POA: Diagnosis not present

## 2017-08-29 DIAGNOSIS — J449 Chronic obstructive pulmonary disease, unspecified: Secondary | ICD-10-CM | POA: Diagnosis not present

## 2017-09-29 DIAGNOSIS — J441 Chronic obstructive pulmonary disease with (acute) exacerbation: Secondary | ICD-10-CM | POA: Diagnosis not present

## 2017-09-29 DIAGNOSIS — E1169 Type 2 diabetes mellitus with other specified complication: Secondary | ICD-10-CM | POA: Diagnosis not present

## 2017-11-04 ENCOUNTER — Ambulatory Visit (HOSPITAL_COMMUNITY)
Admission: EM | Admit: 2017-11-04 | Discharge: 2017-11-04 | Disposition: A | Discharge status: Home / Self Care | Payer: Medicare Other | Attending: Family Medicine | Admitting: Family Medicine

## 2017-11-04 ENCOUNTER — Ambulatory Visit (INDEPENDENT_AMBULATORY_CARE_PROVIDER_SITE_OTHER): Payer: Medicare Other

## 2017-11-04 ENCOUNTER — Encounter (HOSPITAL_COMMUNITY): Payer: Self-pay | Admitting: *Deleted

## 2017-11-04 DIAGNOSIS — J441 Chronic obstructive pulmonary disease with (acute) exacerbation: Secondary | ICD-10-CM

## 2017-11-04 DIAGNOSIS — R05 Cough: Secondary | ICD-10-CM | POA: Diagnosis not present

## 2017-11-04 DIAGNOSIS — R0602 Shortness of breath: Secondary | ICD-10-CM | POA: Diagnosis not present

## 2017-11-04 MED ORDER — PREDNISONE 10 MG (21) PO TBPK: ORAL_TABLET | Freq: Every day | ORAL | 0 refills | Status: DC

## 2017-11-04 MED ORDER — IPRATROPIUM-ALBUTEROL 0.5-2.5 (3) MG/3ML IN SOLN
RESPIRATORY_TRACT | Status: AC
  Filled 2017-11-04: qty 3

## 2017-11-04 MED ORDER — IPRATROPIUM-ALBUTEROL 0.5-2.5 (3) MG/3ML IN SOLN
3.0000 mL | Freq: Once | RESPIRATORY_TRACT | Status: AC
  Administered 2017-11-04: 3 mL via RESPIRATORY_TRACT

## 2017-11-04 MED ORDER — AMOXICILLIN 875 MG PO TABS: 875.0000 mg | ORAL_TABLET | Freq: Two times a day (BID) | ORAL | 0 refills | Status: DC

## 2017-11-04 NOTE — ED Triage Notes (Signed)
Patient reports two day history of sob, cough, runny nose, chills, and generalized body aches. Patient did get flu shot.

## 2017-11-04 NOTE — ED Provider Notes (Addendum)
[facility]   [id number] 11/04/17 Arrival Time: 1005   SUBJECTIVE:  Kristy Salazar is a 77 y.o. female who presents to the urgent care with complaint of two day history of sob, cough, runny nose, chills, and generalized body aches. Patient did get flu shot.   Symptoms came on rather abruptly.  She saw her primary care doctor several weeks ago who said she was doing fine.  Patient is normally on 3 different inhalers.  She continues to smoke and has recurrent episodes of COPD.  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  Past Medical History:  Diagnosis Date  . Asthma   . Diabetes mellitus   . Hypertension   . Seasonal allergies    History reviewed. No pertinent family history. Social History   Socioeconomic History  . Marital status: Legally Separated    Spouse name: Not on file  . Number of children: Not on file  . Years of education: Not on file  . Highest education level: Not on file  Occupational History  . Not on file  Social Needs  . Financial resource strain: Not on file  . Food insecurity:    Worry: Not on file    Inability: Not on file  . Transportation needs:    Medical: Not on file    Non-medical: Not on file  Tobacco Use  . Smoking status: Current Every Day Smoker    Packs/day: 0.50    Years: 50.00    Pack years: 25.00  . Smokeless tobacco: Never Used  Substance and Sexual Activity  . Alcohol use: No  . Drug use: No  . Sexual activity: Not on file  Lifestyle  . Physical activity:    Days per week: Not on file    Minutes per session: Not on file  . Stress: Not on file  Relationships  . Social connections:    Talks on phone: Not on file    Gets together: Not on file    Attends religious service: Not on file    Active member of club or organization: Not on file    Attends meetings of clubs or organizations: Not on file    Relationship status: Not on file  . Intimate partner violence:    Fear of  current or ex partner: Not on file    Emotionally abused: Not on file    Physically abused: Not on file    Forced sexual activity: Not on file  Other Topics Concern  . Not on file  Social History Narrative  . Not on file   No outpatient medications have been marked as taking for the 11/04/17 encounter [facility](Hospital Encounter).   Allergies  Allergen Reactions  . Erythromycin Hives      ROS: As per HPI, remainder of ROS negative.   OBJECTIVE:   Vitals:   11/04/17 1029  BP: 134/82  Pulse: 92  Resp: 18  Temp: 99.7 F (37.6 C)  TempSrc: Oral  SpO2: 94%     General appearance: alert; mild respiratory distress Eyes: PERRL; EOMI; conjunctiva normal HENT: normocephalic; atraumatic; TMs normal, canal normal, external ears normal without trauma; nasal mucosa normal; oral mucosa normal Neck: supple Lungs: wheezes on auscultation bilaterally Heart: regular rate and rhythm Back: no CVA tenderness Extremities: no cyanosis or edema; symmetrical with no gross deformities Skin: warm and dry Neurologic: normal gait; grossly normal Psychological: alert and cooperative; normal mood and affect      Labs:  Results  for orders placed or performed during the hospital encounter of 05/13/11  Glucose, capillary  Result Value Ref Range   Glucose-Capillary 110 (H) 70 - 99 mg/dL   Comment 1 Documented in Chart    Comment 2 Notify RN   Glucose, capillary  Result Value Ref Range   Glucose-Capillary 129 (H) 70 - 99 mg/dL  Protime-INR  Result Value Ref Range   Prothrombin Time 13.6 11.6 - 15.2 seconds   INR 1.02 0.00 - 1.49  Glucose, capillary  Result Value Ref Range   Glucose-Capillary 98 70 - 99 mg/dL   Comment 1 Documented in Chart   Glucose, capillary  Result Value Ref Range   Glucose-Capillary 110 (H) 70 - 99 mg/dL   Comment 1 Notify RN   Hemoglobin and hematocrit, blood  Result Value Ref Range   Hemoglobin 10.6 (L) 12.0 - 15.0 g/dL   HCT 16.1 (L) 09.6 - 04.5 %  Glucose,  capillary  Result Value Ref Range   Glucose-Capillary 128 (H) 70 - 99 mg/dL  Glucose, capillary  Result Value Ref Range   Glucose-Capillary 125 (H) 70 - 99 mg/dL  Glucose, capillary  Result Value Ref Range   Glucose-Capillary 129 (H) 70 - 99 mg/dL  Protime-INR  Result Value Ref Range   Prothrombin Time 18.9 (H) 11.6 - 15.2 seconds   INR 1.55 (H) 0.00 - 1.49  Glucose, capillary  Result Value Ref Range   Glucose-Capillary 137 (H) 70 - 99 mg/dL  Glucose, capillary  Result Value Ref Range   Glucose-Capillary 117 (H) 70 - 99 mg/dL  Hemoglobin and hematocrit, blood  Result Value Ref Range   Hemoglobin 9.7 (L) 12.0 - 15.0 g/dL   HCT 40.9 (L) 81.1 - 91.4 %  Protime-INR  Result Value Ref Range   Prothrombin Time 20.9 (H) 11.6 - 15.2 seconds   INR 1.77 (H) 0.00 - 1.49  Glucose, capillary  Result Value Ref Range   Glucose-Capillary 117 (H) 70 - 99 mg/dL  Glucose, capillary  Result Value Ref Range   Glucose-Capillary 122 (H) 70 - 99 mg/dL  Glucose, capillary  Result Value Ref Range   Glucose-Capillary 108 (H) 70 - 99 mg/dL  Glucose, capillary  Result Value Ref Range   Glucose-Capillary 112 (H) 70 - 99 mg/dL  Glucose, capillary  Result Value Ref Range   Glucose-Capillary 141 (H) 70 - 99 mg/dL  Glucose, capillary  Result Value Ref Range   Glucose-Capillary 97 70 - 99 mg/dL  Protime-INR  Result Value Ref Range   Prothrombin Time 21.5 (H) 11.6 - 15.2 seconds   INR 1.83 (H) 0.00 - 1.49  Hemoglobin and hematocrit, blood  Result Value Ref Range   Hemoglobin 8.1 (L) 12.0 - 15.0 g/dL   HCT 78.2 (L) 95.6 - 21.3 %  Glucose, capillary  Result Value Ref Range   Glucose-Capillary 114 (H) 70 - 99 mg/dL  Glucose, capillary  Result Value Ref Range   Glucose-Capillary 115 (H) 70 - 99 mg/dL    Labs Reviewed - No data to display  Dg Chest 2 View  Result Date: 11/04/2017 CLINICAL DATA:  Two week history of shortness of breath, cough, and runny nose. EXAM: CHEST - 2 VIEW COMPARISON:   Chest x-ray dated August 22, 2017. FINDINGS: The heart size and mediastinal contours are within normal limits. Normal pulmonary vascularity. Atherosclerotic calcification of the aortic arch. The lungs remain hyperinflated with emphysematous changes. Stable biapical pleuroparenchymal scarring. Atelectasis/scarring at the left lung base. No focal consolidation, pleural effusion,  or pneumothorax. No acute osseous abnormality. IMPRESSION: COPD.  No active cardiopulmonary disease. Electronically Signed   By: Obie Dredge M.D.   On: 11/04/2017 11:19     Chest film 08/22/17: CHEST  2 VIEW  COMPARISON:  PA and lateral chest x-ray dated March 04, 2016  FINDINGS: The lungs remain hyperinflated with hemidiaphragm flattening and increased AP dimension of the thorax. The interstitial markings are coarse but stable. There is no pleural effusion. There is patchy increased density projecting over the anterior aspect of the left lower lobe. The heart and pulmonary vascularity are normal. There calcification in the wall of the aortic arch. There is multilevel degenerative disc disease of the thoracic spine.  IMPRESSION: COPD. Patchy increased density in the anterior aspect of the left lower lobe may reflect early pneumonia. Followup PA and lateral chest X-ray is recommended in 3-4 weeks following trial of antibiotic therapy to ensure resolution and exclude underlying malignancy. Thoracic aortic atherosclerosis.   CT scan 2003: FINDINGS CLINICAL DATA:  MIDLINE CHEST PAIN, SHORT OF BREATH. CT OF THE CHEST WITH CONTRAST MULTIDETECTOR HELICAL SCANS THROUGH THE CHEST WERE PERFORMED AFTER IV CONTRAST MEDIA WAS GIVEN. 150 CC OF OMNIPAQUE 300 WERE GIVEN AS THE CONTRAST MEDIA TO THIS PATIENT WITH A HISTORY OF SEVERE COPD. THE PULMONARY ARTERIES AND THEIR BRANCHES ENHANCE WELL WITH NO EVIDENCE OF PULMONARY EMBOLI.  THE THORACIC AORTA ALSO ENHANCES NORMALLY.  ON LUNG WINDOW IMAGES THERE ARE SEVERE  CHANGES OF COPD WITH BULLOUS FORMATION DIFFUSELY PARTICULARLY INVOLVING THE UPPER LOBES.  THERE IS LINEAR ATELECTASIS OR SCARRING IN BOTH LOWER LOBES AND WITHIN THE LINGULA.  NO INFLAMMATORY PROCESS OR EFFUSION IS SEEN. IMPRESSION 1.  NO EVIDENCE OF PULMONARY EMBOLISM. 2.  SEVERE COPD. 3.  BASILAR ATELECTASIS LEFT GREATER THAN RIGHT.    ASSESSMENT & PLAN:  1. COPD exacerbation (HCC)     Meds ordered this encounter  Medications  . ipratropium-albuterol (DUONEB) 0.5-2.5 (3) MG/3ML nebulizer solution 3 mL  . predniSONE (STERAPRED UNI-PAK 21 TAB) 10 MG (21) TBPK tablet    Sig: Take by mouth daily. Take as directed.    Dispense:  21 tablet    Refill:  0  . amoxicillin (AMOXIL) 875 MG tablet    Sig: Take 1 tablet (875 mg total) by mouth 2 (two) times daily.    Dispense:  20 tablet    Refill:  0    Reviewed expectations re: course of current medical issues. Questions answered. Outlined signs and symptoms indicating need for more acute intervention. Patient verbalized understanding. After Visit Summary given.    Procedures:  Duoneb breathing treatment given and patient improved.      Elvina Sidle, MD 11/04/17 1127    Elvina Sidle, MD 11/04/17 1130

## 2017-11-14 DIAGNOSIS — E785 Hyperlipidemia, unspecified: Secondary | ICD-10-CM | POA: Diagnosis not present

## 2017-11-14 DIAGNOSIS — J449 Chronic obstructive pulmonary disease, unspecified: Secondary | ICD-10-CM | POA: Diagnosis not present

## 2017-11-14 DIAGNOSIS — E1169 Type 2 diabetes mellitus with other specified complication: Secondary | ICD-10-CM | POA: Diagnosis not present

## 2017-11-14 DIAGNOSIS — M130 Polyarthritis, unspecified: Secondary | ICD-10-CM | POA: Diagnosis not present

## 2018-02-09 ENCOUNTER — Ambulatory Visit (HOSPITAL_COMMUNITY)
Admission: EM | Admit: 2018-02-09 | Discharge: 2018-02-09 | Disposition: A | Discharge status: Home / Self Care | Payer: Medicare Other | Attending: Family Medicine | Admitting: Family Medicine

## 2018-02-09 ENCOUNTER — Ambulatory Visit (INDEPENDENT_AMBULATORY_CARE_PROVIDER_SITE_OTHER): Payer: Medicare Other

## 2018-02-09 ENCOUNTER — Encounter (HOSPITAL_COMMUNITY): Payer: Self-pay | Admitting: Emergency Medicine

## 2018-02-09 ENCOUNTER — Other Ambulatory Visit: Payer: Self-pay

## 2018-02-09 DIAGNOSIS — R05 Cough: Secondary | ICD-10-CM | POA: Diagnosis not present

## 2018-02-09 DIAGNOSIS — J441 Chronic obstructive pulmonary disease with (acute) exacerbation: Secondary | ICD-10-CM | POA: Diagnosis not present

## 2018-02-09 DIAGNOSIS — R0602 Shortness of breath: Secondary | ICD-10-CM | POA: Diagnosis not present

## 2018-02-09 MED ORDER — BENZONATATE 100 MG PO CAPS
100.0000 mg | ORAL_CAPSULE | Freq: Three times a day (TID) | ORAL | 0 refills | Status: DC
Start: 2018-02-09 — End: 2018-04-23

## 2018-02-09 MED ORDER — GUAIFENESIN 100 MG/5ML PO LIQD: 100.0000 mg | ORAL | 0 refills | Status: DC | PRN

## 2018-02-09 MED ORDER — PREDNISONE 50 MG PO TABS: ORAL_TABLET | ORAL | 0 refills | Status: DC

## 2018-02-09 MED ORDER — IPRATROPIUM-ALBUTEROL 0.5-2.5 (3) MG/3ML IN SOLN
RESPIRATORY_TRACT | Status: AC
  Filled 2018-02-09: qty 3

## 2018-02-09 MED ORDER — IPRATROPIUM-ALBUTEROL 0.5-2.5 (3) MG/3ML IN SOLN
3.0000 mL | Freq: Once | RESPIRATORY_TRACT | Status: AC
Start: 2018-02-09 — End: 2018-02-09
  Administered 2018-02-09: 3 mL via RESPIRATORY_TRACT

## 2018-02-09 NOTE — ED Provider Notes (Addendum)
[facility]    CSN: [id number] Arrival date & time: 02/09/18  1420     History   Chief Complaint Chief Complaint  Patient presents with  . Shortness of Breath    HPI Kristy Salazar is a 77 y.o. female.   Patient is a 77 year old female with past medical history of COPD, asthma, diabetes, hypertension.  She presents today with 2 weeks of cough, shortness of breath.  She reports that the cough got worse when she was exposed to some spray pain at work and this exacerbated her symptoms.  She has been using her inhaler with minimal relief.  She has been coughing up clear phlegm.  She has become more short of breath with exertion.  She denies any fever, chills, body aches, fatigue.  She denies any lower extremity swelling.  She denies any recent traveling or sick contacts.  She has cut back smoking to 3 cigarettes/day.  She is leaving to go out of town tomorrow and wanted to be evaluated before her trip.   ROS per HPI      Past Medical History:  Diagnosis Date  . Asthma   . Diabetes mellitus   . Hypertension   . Seasonal allergies     Patient Active Problem List   Diagnosis Date Noted  . COPD GOLD 0 still smoking  03/04/2016  . Cigarette smoker 03/04/2016  . Hip arthritis 05/16/2011    Past Surgical History:  Procedure Laterality Date  . ABDOMINAL HYSTERECTOMY    . APPENDECTOMY    . BACK SURGERY    . JOINT REPLACEMENT    . TOTAL HIP ARTHROPLASTY    . TOTAL KNEE ARTHROPLASTY      OB History   None      Home Medications    Prior to Admission medications   Medication Sig Start Date End Date Taking? Authorizing Provider  albuterol (PROVENTIL HFA;VENTOLIN HFA) 108 (90 BASE) MCG/ACT inhaler Inhale 1 puff into the lungs every 6 (six) hours as needed for wheezing or shortness of breath.    [provider]  amoxicillin (AMOXIL) 875 MG tablet Take 1 tablet (875 mg total) by mouth 2 (two) times daily. 11/04/17   Elvina Sidle, MD  benzonatate  (TESSALON) 100 MG capsule Take 1 capsule (100 mg total) by mouth every 8 (eight) hours. 02/09/18   Dahlia Byes A, NP  guaiFENesin (ROBITUSSIN) 100 MG/5ML liquid Take 5-10 mLs (100-200 mg total) by mouth every 4 (four) hours as needed for cough. 02/09/18   Rayel Santizo, Gloris Manchester A, NP  montelukast (SINGULAIR) 10 MG tablet Take 10 mg by mouth daily. For seasonal allergies    [provider]  predniSONE (DELTASONE) 50 MG tablet Take one tab daily for 5 days. 02/09/18   Dahlia Byes A, NP  rosuvastatin (CRESTOR) 10 MG tablet Take 10 mg by mouth daily.      [provider]  tiotropium (SPIRIVA) 18 MCG inhalation capsule Place 18 mcg into inhaler and inhale daily.    [provider]    Family History History reviewed. No pertinent family history.  Social History Social History   Tobacco Use  . Smoking status: Current Every Day Smoker    Packs/day: 0.50    Years: 50.00    Pack years: 25.00  . Smokeless tobacco: Never Used  Substance Use Topics  . Alcohol use: No  . Drug use: No     Allergies   Erythromycin   Review of Systems Review of Systems  Physical Exam Triage Vital Signs ED Triage Vitals  Enc Vitals Group     BP 02/09/18 1440 (!) 143/84     Pulse Rate 02/09/18 1440 86     Resp 02/09/18 1440 (!) 32     Temp 02/09/18 1440 98.3 F (36.8 C)     Temp Source 02/09/18 1440 Oral     SpO2 02/09/18 1440 95 %     Weight --      Height --      Head Circumference --      Peak Flow --      Pain Score 02/09/18 1438 7     Pain Loc --      Pain Edu? --      Excl. in GC? --    No data found.  Updated Vital Signs BP (!) 143/84 (BP Location: Left Arm)   Pulse 86   Temp 98.3 F (36.8 C) (Oral)   Resp (!) 32   SpO2 95%   Visual Acuity Right Eye Distance:   Left Eye Distance:   Bilateral Distance:    Right Eye Near:   Left Eye Near:    Bilateral Near:     Physical Exam  Constitutional: She appears well-developed and well-nourished.  Non-toxic appearance.  She does not appear ill.  HENT:  Head: Normocephalic and atraumatic.  Mouth/Throat: Oropharynx is clear and moist.  Bilateral TMs normal. No tonsillar swelling, erythema or exudates. No adenopathy.   Neck: Normal range of motion.  Cardiovascular: Normal rate, regular rhythm and normal heart sounds.  Pulmonary/Chest: Tachypnea noted. She has no decreased breath sounds.  Mild expiratory wheezing throughout with decreased lung sounds. Rhonchi in the Left lower lobe cleared with cough.   Lymphadenopathy:    She has no cervical adenopathy.  Neurological: She is alert.  Skin: Skin is warm and dry. Capillary refill takes less than 2 seconds.  Psychiatric: She has a normal mood and affect.  Nursing note and vitals reviewed.    UC Treatments / Results  Labs (all labs ordered are listed, but only abnormal results are displayed) Labs Reviewed - No data to display  EKG None  Radiology Dg Chest 2 View  Result Date: 02/09/2018 CLINICAL DATA:  Cough, shortness of breath EXAM: CHEST - 2 VIEW COMPARISON:  11/04/2017 FINDINGS: Mild left basilar scarring. Biapical pleural-parenchymal scarring. No focal consolidation. No pleural effusion or pneumothorax. The heart is normal in size. Visualized osseous structures are within normal limits. IMPRESSION: No evidence of acute cardiopulmonary disease. Electronically Signed   By: Charline Bills M.D.   On: 02/09/2018 15:10    Procedures Procedures (including critical care time)  Medications Ordered in UC Medications  ipratropium-albuterol (DUONEB) 0.5-2.5 (3) MG/3ML nebulizer solution 3 mL (3 mLs Nebulization Given 02/09/18 1503)    Initial Impression / Assessment and Plan / UC Course  I have reviewed the triage vital signs and the nursing notes.  Pertinent labs & imaging results that were available during my care of the patient were reviewed by me and considered in my medical decision making (see chart for details).    X-ray negative DuoNeb in  clinic.  Tachypnea decreased after DuoNeb to 24 respirations per minute and lungs cleared.  Patient states she felt much better.  We will send home with Prescription for short burst of steroids and some cough medicine for her symptoms.  Follow-up as needed.  Final Clinical Impressions(s) / UC Diagnoses   Final diagnoses:  COPD exacerbation (HCC)  Discharge Instructions     It was nice meeting you!!  Your xray is negative for pneumonia or bronchitis.  We will treat you for a COPD/Asthma flare up.  Steroids for 5 days and cough medication as needed.  Follow up with your doctor as needed if not improving.     ED Prescriptions    Medication Sig Dispense Auth. Provider   guaiFENesin (ROBITUSSIN) 100 MG/5ML liquid Take 5-10 mLs (100-200 mg total) by mouth every 4 (four) hours as needed for cough. 60 mL Karri Kallenbach A, NP   benzonatate (TESSALON) 100 MG capsule Take 1 capsule (100 mg total) by mouth every 8 (eight) hours. 21 capsule Alaia Lordi A, NP   predniSONE (DELTASONE) 50 MG tablet Take one tab daily for 5 days. 5 tablet Dahlia ByesBast, Kaylin Marcon A, NP     Controlled Substance Prescriptions [place] Controlled Substance Registry consulted? Not Applicable   Janace ArisBast, Mabry Tift A, NP 02/09/18 1600    Dahlia ByesBast, Raphaela Cannaday A, NP 02/09/18 1608

## 2018-02-09 NOTE — Discharge Instructions (Signed)
It was nice meeting you!!  Your xray is negative for pneumonia or bronchitis.  We will treat you for a COPD/Asthma flare up.  Steroids for 5 days and cough medication as needed.  Follow up with your doctor as needed if not improving.

## 2018-02-09 NOTE — ED Triage Notes (Signed)
Sob for 2 weeks, cough, congestion.  Very difficult to cough up any phlegm and if she does, it is clear per patient  Yesterday symptoms were made worse by being exposed to spray paint in the same area as patient.    Today has noticed increase in sob with daily activities.   Patient is also getting ready to go out of town.

## 2018-02-09 NOTE — ED Notes (Signed)
Patient transported to X-ray 

## 2018-03-14 DIAGNOSIS — E1169 Type 2 diabetes mellitus with other specified complication: Secondary | ICD-10-CM | POA: Diagnosis not present

## 2018-03-14 DIAGNOSIS — E785 Hyperlipidemia, unspecified: Secondary | ICD-10-CM | POA: Diagnosis not present

## 2018-03-14 DIAGNOSIS — I10 Essential (primary) hypertension: Secondary | ICD-10-CM | POA: Diagnosis not present

## 2018-03-14 DIAGNOSIS — J449 Chronic obstructive pulmonary disease, unspecified: Secondary | ICD-10-CM | POA: Diagnosis not present

## 2018-03-26 DIAGNOSIS — H2511 Age-related nuclear cataract, right eye: Secondary | ICD-10-CM | POA: Diagnosis not present

## 2018-04-23 ENCOUNTER — Encounter (HOSPITAL_COMMUNITY): Payer: Self-pay

## 2018-04-23 ENCOUNTER — Ambulatory Visit (HOSPITAL_COMMUNITY)
Admission: EM | Admit: 2018-04-23 | Discharge: 2018-04-23 | Disposition: A | Discharge status: Home / Self Care | Payer: Medicare Other | Attending: Family Medicine | Admitting: Family Medicine

## 2018-04-23 DIAGNOSIS — R05 Cough: Secondary | ICD-10-CM | POA: Diagnosis not present

## 2018-04-23 DIAGNOSIS — R059 Cough, unspecified: Secondary | ICD-10-CM

## 2018-04-23 MED ORDER — PREDNISONE 50 MG PO TABS: 50.0000 mg | ORAL_TABLET | Freq: Every day | ORAL | 0 refills | Status: DC

## 2018-04-23 MED ORDER — BENZONATATE 100 MG PO CAPS: 100.0000 mg | ORAL_CAPSULE | Freq: Three times a day (TID) | ORAL | 0 refills | Status: DC

## 2018-04-23 NOTE — Discharge Instructions (Signed)
Start tessalon as directed. If continues with cough, fill prednisone and start as directed. Follow up with PCP for reevaluation in 1-2 weeks. If experiencing shortness of breath, chest pain, wheezing, follow up for reevaluation needed.

## 2018-04-23 NOTE — ED Provider Notes (Signed)
[facility]    CSN: [id number] Arrival date & time: 04/23/18  1309     History   Chief Complaint Chief Complaint  Patient presents with  . Medication Refill    benzonate capsules  for persistent cough  . Cough    HPI Kristy Salazar is a 77 y.o. female.   77 year old female with history of asthma, diabetes, HTN, COPD comes in for refill of Tessalon due to cough.  Patient states due to COPD, continued smoking, she has exacerbation every 3 to 4 months.  States current episode started 1 week ago, with mild cough.  States it is gradually worsening, and last night had more persistent cough with mild productive cough.  She states that usually if this keeps going on, she will start wheezing.  However currently without chest pain, shortness of breath, wheezing, palpitation.  States would like some Tessalon to help with cough.  Denies fever, chills, night sweats.  Denies rhinorrhea, nasal congestion, sore throat.  Current everyday smoker.     Past Medical History:  Diagnosis Date  . Asthma   . Diabetes mellitus   . Hypertension   . Seasonal allergies     Patient Active Problem List   Diagnosis Date Noted  . COPD GOLD 0 still smoking  03/04/2016  . Cigarette smoker 03/04/2016  . Hip arthritis 05/16/2011    Past Surgical History:  Procedure Laterality Date  . ABDOMINAL HYSTERECTOMY    . APPENDECTOMY    . BACK SURGERY    . JOINT REPLACEMENT    . TOTAL HIP ARTHROPLASTY    . TOTAL KNEE ARTHROPLASTY      OB History   None      Home Medications    Prior to Admission medications   Medication Sig Start Date End Date Taking? Authorizing Provider  albuterol (PROVENTIL HFA;VENTOLIN HFA) 108 (90 BASE) MCG/ACT inhaler Inhale 1 puff into the lungs every 6 (six) hours as needed for wheezing or shortness of breath.    [provider]  benzonatate (TESSALON) 100 MG capsule Take 1 capsule (100 mg total) by mouth every 8 (eight) hours. 04/23/18   Cathie Hoops, Cina Klumpp V, PA-C    montelukast (SINGULAIR) 10 MG tablet Take 10 mg by mouth daily. For seasonal allergies    [provider]  predniSONE (DELTASONE) 50 MG tablet Take 1 tablet (50 mg total) by mouth daily. 04/23/18   Cathie Hoops, Jeris Roser V, PA-C  rosuvastatin (CRESTOR) 10 MG tablet Take 10 mg by mouth daily.      [provider]  tiotropium (SPIRIVA) 18 MCG inhalation capsule Place 18 mcg into inhaler and inhale daily.    [provider]    Family History History reviewed. No pertinent family history.  Social History Social History   Tobacco Use  . Smoking status: Current Every Day Smoker    Packs/day: 0.50    Years: 50.00    Pack years: 25.00  . Smokeless tobacco: Never Used  Substance Use Topics  . Alcohol use: No  . Drug use: No     Allergies   Erythromycin   Review of Systems Review of Systems  Reason unable to perform ROS: See HPI as above.     Physical Exam Triage Vital Signs ED Triage Vitals  Enc Vitals Group     BP 04/23/18 1325 (!) 154/70     Pulse Rate 04/23/18 1325 73     Resp 04/23/18 1325 20     Temp 04/23/18 1325 98.2 F (36.8  C)     Temp Source 04/23/18 1325 Oral     SpO2 04/23/18 1325 100 %     Weight --      Height --      Head Circumference --      Peak Flow --      Pain Score 04/23/18 1326 2     Pain Loc --      Pain Edu? --      Excl. in GC? --    No data found.  Updated Vital Signs BP (!) 154/70 (BP Location: Right Arm)   Pulse 73   Temp 98.2 F (36.8 C) (Oral)   Resp 20   SpO2 100%   Visual Acuity Right Eye Distance:   Left Eye Distance:   Bilateral Distance:    Right Eye Near:   Left Eye Near:    Bilateral Near:     Physical Exam  Constitutional: She is oriented to person, place, and time. She appears well-developed and well-nourished. No distress.  HENT:  Head: Normocephalic and atraumatic.  Right Ear: Tympanic membrane, external ear and ear canal normal. Tympanic membrane is not erythematous and not bulging.  Left  Ear: Tympanic membrane, external ear and ear canal normal. Tympanic membrane is not erythematous and not bulging.  Nose: Nose normal. Right sinus exhibits no maxillary sinus tenderness and no frontal sinus tenderness. Left sinus exhibits no maxillary sinus tenderness and no frontal sinus tenderness.  Mouth/Throat: Uvula is midline, oropharynx is clear and moist and mucous membranes are normal.  Eyes: Pupils are equal, round, and reactive to light. Conjunctivae are normal.  Neck: Normal range of motion. Neck supple.  Cardiovascular: Normal rate, regular rhythm and normal heart sounds. Exam reveals no gallop and no friction rub.  No murmur heard. Pulmonary/Chest: Effort normal and breath sounds normal. No accessory muscle usage or stridor. No respiratory distress. She has no decreased breath sounds. She has no wheezes. She has no rhonchi. She has no rales.  Lymphadenopathy:    She has no cervical adenopathy.  Neurological: She is alert and oriented to person, place, and time.  Skin: Skin is warm and dry.  Psychiatric: She has a normal mood and affect. Her behavior is normal. Judgment normal.     UC Treatments / Results  Labs (all labs ordered are listed, but only abnormal results are displayed) Labs Reviewed - No data to display  EKG None  Radiology No results found.  Procedures Procedures (including critical care time)  Medications Ordered in UC Medications - No data to display  Initial Impression / Assessment and Plan / UC Course  I have reviewed the triage vital signs and the nursing notes.  Pertinent labs & imaging results that were available during my care of the patient were reviewed by me and considered in my medical decision making (see chart for details).     No alarming signs on exam.  Lungs clear to auscultation bilaterally without adventitious lung sounds.  Nontoxic in appearance.  Will provide Tessalon for cough.  Rx of prednisone provided, can start if symptoms  continues for COPD exacerbation.  Smoking cessation discussed.  Return precautions given.  Patient expresses understanding and agrees to plan.  Final Clinical Impressions(s) / UC Diagnoses   Final diagnoses:  Cough    ED Prescriptions    Medication Sig Dispense Auth. Provider   benzonatate (TESSALON) 100 MG capsule Take 1 capsule (100 mg total) by mouth every 8 (eight) hours. 21 capsule Belinda Fisher, PA-C  predniSONE (DELTASONE) 50 MG tablet Take 1 tablet (50 mg total) by mouth daily. 5 tablet Threasa Alpha, PA-C 04/23/18 1359

## 2018-04-23 NOTE — ED Triage Notes (Signed)
Pt presents with persistent cough and wanting to get medication refill on benzonate capsules  for persistent cough.

## 2018-07-31 DIAGNOSIS — E785 Hyperlipidemia, unspecified: Secondary | ICD-10-CM | POA: Diagnosis not present

## 2018-07-31 DIAGNOSIS — E1169 Type 2 diabetes mellitus with other specified complication: Secondary | ICD-10-CM | POA: Diagnosis not present

## 2018-07-31 DIAGNOSIS — J441 Chronic obstructive pulmonary disease with (acute) exacerbation: Secondary | ICD-10-CM | POA: Diagnosis not present

## 2018-07-31 DIAGNOSIS — I10 Essential (primary) hypertension: Secondary | ICD-10-CM | POA: Diagnosis not present

## 2018-10-03 DIAGNOSIS — J449 Chronic obstructive pulmonary disease, unspecified: Secondary | ICD-10-CM | POA: Diagnosis not present

## 2018-10-03 DIAGNOSIS — E1169 Type 2 diabetes mellitus with other specified complication: Secondary | ICD-10-CM | POA: Diagnosis not present

## 2018-10-03 DIAGNOSIS — J441 Chronic obstructive pulmonary disease with (acute) exacerbation: Secondary | ICD-10-CM | POA: Diagnosis not present

## 2018-10-17 DIAGNOSIS — J441 Chronic obstructive pulmonary disease with (acute) exacerbation: Secondary | ICD-10-CM | POA: Diagnosis not present

## 2018-10-17 DIAGNOSIS — Z0000 Encounter for general adult medical examination without abnormal findings: Secondary | ICD-10-CM | POA: Diagnosis not present

## 2018-10-31 ENCOUNTER — Ambulatory Visit (HOSPITAL_COMMUNITY)
Admission: EM | Admit: 2018-10-31 | Discharge: 2018-10-31 | Disposition: A | Discharge status: Home / Self Care | Payer: Medicare Other | Attending: Family Medicine | Admitting: Family Medicine

## 2018-10-31 ENCOUNTER — Ambulatory Visit (INDEPENDENT_AMBULATORY_CARE_PROVIDER_SITE_OTHER): Payer: Medicare Other

## 2018-10-31 ENCOUNTER — Encounter (HOSPITAL_COMMUNITY): Payer: Self-pay

## 2018-10-31 ENCOUNTER — Other Ambulatory Visit: Payer: Self-pay

## 2018-10-31 DIAGNOSIS — R062 Wheezing: Secondary | ICD-10-CM

## 2018-10-31 DIAGNOSIS — J441 Chronic obstructive pulmonary disease with (acute) exacerbation: Secondary | ICD-10-CM

## 2018-10-31 DIAGNOSIS — R05 Cough: Secondary | ICD-10-CM | POA: Diagnosis not present

## 2018-10-31 DIAGNOSIS — R0602 Shortness of breath: Secondary | ICD-10-CM

## 2018-10-31 MED ORDER — BENZONATATE 100 MG PO CAPS: ORAL_CAPSULE | ORAL | 0 refills | Status: DC

## 2018-10-31 MED ORDER — PREDNISONE 10 MG (21) PO TBPK: ORAL_TABLET | Freq: Every day | ORAL | 0 refills | Status: DC

## 2018-10-31 NOTE — ED Triage Notes (Signed)
History of copd, began having sob after carpet in home was pulled up

## 2018-11-04 NOTE — ED Provider Notes (Signed)
[facility]   [id number] 10/31/18 Arrival Time: 1531  ASSESSMENT & PLAN:  1. SOB (shortness of breath)   2. COPD exacerbation (HCC)   3. Wheezing    I have personally viewed the imaging studies ordered this visit. Significant COPD changes. No infiltrates. No indication for hospital admission at this time. To watch symptoms closely.  Meds ordered this encounter  Medications  . predniSONE (STERAPRED UNI-PAK 21 TAB) 10 MG (21) TBPK tablet    Sig: Take by mouth daily. Take as directed.    Dispense:  21 tablet    Refill:  0  . benzonatate (TESSALON) 100 MG capsule    Sig: Take 1 capsule by mouth every 8 (eight) hours for cough.    Dispense:  21 capsule    Refill:  0   Asthma precautions given. OTC symptom care as needed.  Follow-up Information    [facility].   Specialty:  Emergency Medicine Why:  If symptoms worsen in any way. Contact information: [address] [id number] [place] [id number] [phone]         Reviewed expectations re: course of current medical issues. Questions answered. Outlined signs and symptoms indicating need for more acute intervention. Patient verbalized understanding. After Visit Summary given.  SUBJECTIVE: History from: patient.  Kristy Salazar is a 78 y.o. female who presents with complaint of intermittent wheezing. Triggers: recent carpet removal/home renovations; feels this triggered. Onset gradual, over the past week or two; symptoms began after new carpet in home. Describes wheezing as mild to moderate when present. Fever: no. Overall normal PO intake without n/v. Sick contacts: no. Typically her COPE is fairly well controlled. Inhaler use: prn. OTC treatment: none reported.   Social History   Tobacco Use  Smoking Status Current Every Day Smoker  . Packs/day: 0.50  . Years: 50.00  . Pack years: 25.00  Smokeless Tobacco Never Used    ROS: As per HPI.  All other systems negative.   OBJECTIVE:  Vitals:   10/31/18 1556  BP: (!) 154/86  Pulse: (!) 112  Resp: (!) 28  Temp: 98.1 F (36.7 C)  SpO2: 95%    Recheck pulse: 102 Recheck RR: 20  General appearance: alert; appears fatigued HEENT: nasal congestion; clear runny nose; throat irritation secondary to post-nasal drainage Neck: supple without LAD Cv: slight tachycardia; regular rhythm Lungs: unlabored respirations but does get a little winded when speaking several sentences at a time, mild bilateral expiratory wheezing; cough: mild and non-productive here Abd: soft; non-tender Skin: warm and dry Psychological: alert and cooperative; normal mood and affect  Imaging: Dg Chest 2 View  Result Date: 10/31/2018 CLINICAL DATA:  Cough short of breath COPD EXAM: CHEST - 2 VIEW COMPARISON:  02/09/2018 FINDINGS: Severe COPD with marked pulmonary hyperinflation. Diffuse pulmonary scarring bilaterally unchanged. No superimposed acute infiltrate or effusion. Negative for edema. Heart size normal. Atherosclerotic aortic arch. IMPRESSION: Severe COPD without acute abnormality. Electronically Signed   By: Marlan Palauharles  Clark M.D.   On: 10/31/2018 16:24    Allergies  Allergen Reactions  . Erythromycin Hives    Past Medical History:  Diagnosis Date  . Asthma   . Diabetes mellitus   . Hypertension   . Seasonal allergies    FH: HTN  Social History   Socioeconomic History  . Marital status: Legally Separated    Spouse name: Not on file  . Number of children: Not on file  . Years of education: Not on  file  . Highest education level: Not on file  Occupational History  . Not on file  Social Needs  . Financial resource strain: Not on file  . Food insecurity:    Worry: Not on file    Inability: Not on file  . Transportation needs:    Medical: Not on file    Non-medical: Not on file  Tobacco Use  . Smoking status: Current Every Day Smoker    Packs/day: 0.50    Years: 50.00    Pack  years: 25.00  . Smokeless tobacco: Never Used  Substance and Sexual Activity  . Alcohol use: No  . Drug use: No  . Sexual activity: Not on file  Lifestyle  . Physical activity:    Days per week: Not on file    Minutes per session: Not on file  . Stress: Not on file  Relationships  . Social connections:    Talks on phone: Not on file    Gets together: Not on file    Attends religious service: Not on file    Active member of club or organization: Not on file    Attends meetings of clubs or organizations: Not on file    Relationship status: Not on file  . Intimate partner violence:    Fear of current or ex partner: Not on file    Emotionally abused: Not on file    Physically abused: Not on file    Forced sexual activity: Not on file  Other Topics Concern  . Not on file  Social History Narrative  . Not on file            Mardella Layman, MD 11/14/18 0900

## 2018-11-27 DIAGNOSIS — J441 Chronic obstructive pulmonary disease with (acute) exacerbation: Secondary | ICD-10-CM | POA: Diagnosis not present

## 2018-12-11 DIAGNOSIS — J441 Chronic obstructive pulmonary disease with (acute) exacerbation: Secondary | ICD-10-CM | POA: Diagnosis not present

## 2018-12-11 DIAGNOSIS — E1169 Type 2 diabetes mellitus with other specified complication: Secondary | ICD-10-CM | POA: Diagnosis not present

## 2019-01-03 DIAGNOSIS — E785 Hyperlipidemia, unspecified: Secondary | ICD-10-CM | POA: Diagnosis not present

## 2019-01-03 DIAGNOSIS — I10 Essential (primary) hypertension: Secondary | ICD-10-CM | POA: Diagnosis not present

## 2019-01-03 DIAGNOSIS — J441 Chronic obstructive pulmonary disease with (acute) exacerbation: Secondary | ICD-10-CM | POA: Diagnosis not present

## 2019-01-24 DIAGNOSIS — J441 Chronic obstructive pulmonary disease with (acute) exacerbation: Secondary | ICD-10-CM | POA: Diagnosis not present

## 2019-01-24 DIAGNOSIS — Z7189 Other specified counseling: Secondary | ICD-10-CM | POA: Diagnosis not present

## 2019-01-24 DIAGNOSIS — J45909 Unspecified asthma, uncomplicated: Secondary | ICD-10-CM | POA: Diagnosis not present

## 2019-01-26 DIAGNOSIS — H2513 Age-related nuclear cataract, bilateral: Secondary | ICD-10-CM | POA: Diagnosis not present

## 2019-01-31 DIAGNOSIS — J45909 Unspecified asthma, uncomplicated: Secondary | ICD-10-CM | POA: Diagnosis not present

## 2019-01-31 DIAGNOSIS — I10 Essential (primary) hypertension: Secondary | ICD-10-CM | POA: Diagnosis not present

## 2019-01-31 DIAGNOSIS — M130 Polyarthritis, unspecified: Secondary | ICD-10-CM | POA: Diagnosis not present

## 2019-01-31 DIAGNOSIS — E1169 Type 2 diabetes mellitus with other specified complication: Secondary | ICD-10-CM | POA: Diagnosis not present

## 2019-03-06 DIAGNOSIS — I10 Essential (primary) hypertension: Secondary | ICD-10-CM | POA: Diagnosis not present

## 2019-03-06 DIAGNOSIS — E785 Hyperlipidemia, unspecified: Secondary | ICD-10-CM | POA: Diagnosis not present

## 2019-03-06 DIAGNOSIS — J441 Chronic obstructive pulmonary disease with (acute) exacerbation: Secondary | ICD-10-CM | POA: Diagnosis not present

## 2019-03-06 DIAGNOSIS — E1169 Type 2 diabetes mellitus with other specified complication: Secondary | ICD-10-CM | POA: Diagnosis not present

## 2019-06-26 DIAGNOSIS — J449 Chronic obstructive pulmonary disease, unspecified: Secondary | ICD-10-CM | POA: Diagnosis not present

## 2019-06-26 DIAGNOSIS — E039 Hypothyroidism, unspecified: Secondary | ICD-10-CM | POA: Diagnosis not present

## 2019-06-26 DIAGNOSIS — E785 Hyperlipidemia, unspecified: Secondary | ICD-10-CM | POA: Diagnosis not present

## 2019-06-26 DIAGNOSIS — I10 Essential (primary) hypertension: Secondary | ICD-10-CM | POA: Diagnosis not present

## 2019-07-30 ENCOUNTER — Other Ambulatory Visit: Payer: Self-pay

## 2019-07-30 ENCOUNTER — Ambulatory Visit (HOSPITAL_COMMUNITY)
Admission: EM | Admit: 2019-07-30 | Discharge: 2019-07-30 | Disposition: A | Discharge status: Home / Self Care | Payer: BC Managed Care – PPO | Attending: Family Medicine | Admitting: Family Medicine

## 2019-07-30 ENCOUNTER — Encounter (HOSPITAL_COMMUNITY): Payer: Self-pay | Admitting: Emergency Medicine

## 2019-07-30 DIAGNOSIS — M5441 Lumbago with sciatica, right side: Secondary | ICD-10-CM | POA: Diagnosis not present

## 2019-07-30 MED ORDER — PREDNISONE 10 MG PO TABS: ORAL_TABLET | ORAL | 0 refills | Status: DC

## 2019-07-30 MED ORDER — CYCLOBENZAPRINE HCL 5 MG PO TABS: 5.0000 mg | ORAL_TABLET | Freq: Two times a day (BID) | ORAL | 0 refills | Status: DC | PRN

## 2019-07-30 MED ORDER — KETOROLAC TROMETHAMINE 30 MG/ML IJ SOLN
INTRAMUSCULAR | Status: AC
  Filled 2019-07-30: qty 1

## 2019-07-30 MED ORDER — KETOROLAC TROMETHAMINE 30 MG/ML IJ SOLN
30.0000 mg | Freq: Once | INTRAMUSCULAR | Status: AC
  Administered 2019-07-30: 19:00:00 30 mg via INTRAMUSCULAR

## 2019-07-30 NOTE — Discharge Instructions (Signed)
-   We gave you an injection of Toradol tonight, should begin using pain in 30 to 40 minutes - Begin prednisone taper in the morning with food.  Begin with 6 tablets, decrease by 1 tablet each day until the you take 1 tablet on the last day.( 6,5,4,3,2,1) - You may use flexeril as needed to help with pain. This is a muscle relaxer and causes sedation- please use only at bedtime or when you will be home and not have to drive/work -Avoid complete bedrest, please go about daily activities, avoid heavy lifting  Please follow-up if pain not improving, worsening, developing weakness in legs, numbness or tingling, issues with urination or bowel movements, worsening pain

## 2019-07-30 NOTE — ED Triage Notes (Signed)
PT reports lower right sided back pain that radiates down right leg at times.

## 2019-08-01 NOTE — ED Provider Notes (Signed)
[place]    CSN: [id number] Arrival date & time: 07/30/19  1753      History   Chief Complaint Chief Complaint  Patient presents with  . Back Pain    HPI Kristy Salazar is a 79 y.o. female history of asthma, DM type II, hypertension, presenting today for evaluation of back pain.  Patient states that she has had pain in her right lower back which began last Wednesday and has been persisting for the past 5 days.  She has tried a heating pad, Tylenol, ibuprofen 800 and Biofreeze without relief.  She denies any specific injury trauma or fall triggering pain.  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  Denies history of similar.  Pain will occasionally radiate into right leg/gluteal area.  Denies weakness in legs.  Denies change in urination or bowel movements.  Denies numbness or tingling.  Denies dysuria, increased frequency or urgency.  Denies hematuria.    Denies use of any anticoagulants, denies history of kidney disease, GI ulcers or bleeds.  Reports her diabetes is borderline.  HPI  Past Medical History:  Diagnosis Date  . Asthma   . Diabetes mellitus   . Hypertension   . Seasonal allergies     Patient Active Problem List   Diagnosis Date Noted  . COPD GOLD 0 still smoking  03/04/2016  . Cigarette smoker 03/04/2016  . Hip arthritis 05/16/2011    Past Surgical History:  Procedure Laterality Date  . ABDOMINAL HYSTERECTOMY    . APPENDECTOMY    . BACK SURGERY    . JOINT REPLACEMENT    . TOTAL HIP ARTHROPLASTY    . TOTAL KNEE ARTHROPLASTY      OB History   No obstetric history on file.      Home Medications    Prior to Admission medications   Medication Sig Start Date End Date Taking? Authorizing Provider  Fluticasone-Umeclidin-Vilant (TRELEGY ELLIPTA) 100-62.5-25 MCG/INH AEPB Inhale 2 application into the lungs.   Yes [provider]  montelukast  (SINGULAIR) 10 MG tablet Take 10 mg by mouth daily. For seasonal allergies   Yes [provider]  rosuvastatin (CRESTOR) 10 MG tablet Take 10 mg by mouth daily.     Yes [provider]  tiotropium (SPIRIVA) 18 MCG inhalation capsule Place 18 mcg into inhaler and inhale daily.   Yes [provider]  albuterol (PROVENTIL HFA;VENTOLIN HFA) 108 (90 BASE) MCG/ACT inhaler Inhale 1 puff into the lungs every 6 (six) hours as needed for wheezing or shortness of breath.    [provider]  benzonatate (TESSALON) 100 MG capsule Take 1 capsule by mouth every 8 (eight) hours for cough. 10/31/18   Vanessa Kick, MD  cyclobenzaprine (FLEXERIL) 5 MG tablet Take 1-2 tablets (5-10 mg total) by mouth 2 (two) times daily as needed for muscle spasms. 07/30/19   Akeylah Hendel C, PA-C  predniSONE (DELTASONE) 10 MG tablet Tues AM- 6 tabs/ 60 mg; Wed- 5 tabs; Thurs- 4 Tabs, Fri- 3 tabs, Sat- 2 tabs, Sun- 1 tab 07/30/19   Liliya Fullenwider, Elesa Hacker, PA-C    Family History No family history on file.  Social History Social History   Tobacco Use  . Smoking status: Current Every Day Smoker    Packs/day: 0.50    Years: 50.00    Pack years: 25.00  . Smokeless tobacco: Never Used  Substance Use Topics  .  Alcohol use: No  . Drug use: No     Allergies   Erythromycin   Review of Systems Review of Systems  Constitutional: Negative for fatigue and fever.  HENT: Negative for mouth sores.   Eyes: Negative for visual disturbance.  Respiratory: Negative for shortness of breath.   Cardiovascular: Negative for chest pain.  Gastrointestinal: Negative for abdominal pain, nausea and vomiting.  Genitourinary: Negative for decreased urine volume and difficulty urinating.  Musculoskeletal: Positive for back pain and myalgias. Negative for arthralgias and joint swelling.  Skin: Negative for color change, rash and wound.  Neurological: Negative for dizziness, weakness, light-headedness and headaches.      Physical Exam Triage Vital Signs ED Triage Vitals  Enc Vitals Group     BP 07/30/19 1857 (!) 182/83     Pulse Rate 07/30/19 1857 71     Resp 07/30/19 1857 16     Temp 07/30/19 1857 98 F (36.7 C)     Temp Source 07/30/19 1857 Oral     SpO2 07/30/19 1857 98 %     Weight --      Height --      Head Circumference --      Peak Flow --      Pain Score 07/30/19 1858 9     Pain Loc --      Pain Edu? --      Excl. in GC? --    No data found.  Updated Vital Signs BP (!) 182/83   Pulse 71   Temp 98 F (36.7 C) (Oral)   Resp 16   SpO2 98%   Visual Acuity Right Eye Distance:   Left Eye Distance:   Bilateral Distance:    Right Eye Near:   Left Eye Near:    Bilateral Near:     Physical Exam Vitals and nursing note reviewed.  Constitutional:      General: She is not in acute distress.    Appearance: She is well-developed.  HENT:     Head: Normocephalic and atraumatic.  Eyes:     Conjunctiva/sclera: Conjunctivae normal.  Cardiovascular:     Rate and Rhythm: Normal rate and regular rhythm.     Heart sounds: No murmur.  Pulmonary:     Effort: Pulmonary effort is normal. No respiratory distress.     Breath sounds: Normal breath sounds.     Comments: Breathing comfortably at rest, CTABL, no wheezing, rales or other adventitious sounds auscultated  Abdominal:     Palpations: Abdomen is soft.     Tenderness: There is no abdominal tenderness.  Musculoskeletal:     Cervical back: Neck supple.     Comments: Back: Nontender to palpation of cervical, thoracic spine midline, nontender to palpation of lumbar spine midline, increased tenderness to right lateral lumbar musculature and upper gluteal/sacral area, negative straight leg raise bilaterally, strength at hips and knees 5/5 and equal bilaterally, patellar reflex 2+ bilaterally  Skin:    General: Skin is warm and dry.  Neurological:     Mental Status: She is alert.      UC Treatments / Results  Labs (all labs  ordered are listed, but only abnormal results are displayed) Labs Reviewed - No data to display  EKG   Radiology No results found.  Procedures Procedures (including critical care time)  Medications Ordered in UC Medications  ketorolac (TORADOL) 30 MG/ML injection 30 mg (30 mg Intramuscular Given 07/30/19 1928)    Initial Impression / Assessment and Plan /  UC Course  I have reviewed the triage vital signs and the nursing notes.  Pertinent labs & imaging results that were available during my care of the patient were reviewed by me and considered in my medical decision making (see chart for details).     Patient with back pain, likely lumbar strain, does have some radicular distribution.  Will provide 30 mg of Toradol IM today prior to discharge, will initiate prednisone taper x6 days beginning in the morning.  Also provided muscle relaxers to use.  Provided a few days off from work followed by light duty to allow for healing.  No red flags for cauda equina, no neuro deficits on exam.  Continue to monitor,Discussed strict return precautions. Patient verbalized understanding and is agreeable with plan.  Final Clinical Impressions(s) / UC Diagnoses   Final diagnoses:  Acute right-sided low back pain with right-sided sciatica     Discharge Instructions     - We gave you an injection of Toradol tonight, should begin using pain in 30 to 40 minutes - Begin prednisone taper in the morning with food.  Begin with 6 tablets, decrease by 1 tablet each day until the you take 1 tablet on the last day.( 6,5,4,3,2,1) - You may use flexeril as needed to help with pain. This is a muscle relaxer and causes sedation- please use only at bedtime or when you will be home and not have to drive/work -Avoid complete bedrest, please go about daily activities, avoid heavy lifting  Please follow-up if pain not improving, worsening, developing weakness in legs, numbness or tingling, issues with urination or  bowel movements, worsening pain    ED Prescriptions    Medication Sig Dispense Auth. Provider   predniSONE (DELTASONE) 10 MG tablet Tues AM- 6 tabs/ 60 mg; Wed- 5 tabs; Thurs- 4 Tabs, Fri- 3 tabs, Sat- 2 tabs, Sun- 1 tab 21 tablet Sanjay Broadfoot C, PA-C   cyclobenzaprine (FLEXERIL) 5 MG tablet Take 1-2 tablets (5-10 mg total) by mouth 2 (two) times daily as needed for muscle spasms. 24 tablet Aaban Griep, [place] C, PA-C     PDMP not reviewed this encounter.   Lew Dawes, PA-C 08/01/19 1007

## 2019-09-09 DIAGNOSIS — E785 Hyperlipidemia, unspecified: Secondary | ICD-10-CM | POA: Diagnosis not present

## 2019-09-09 DIAGNOSIS — J449 Chronic obstructive pulmonary disease, unspecified: Secondary | ICD-10-CM | POA: Diagnosis not present

## 2019-09-09 DIAGNOSIS — Z720 Tobacco use: Secondary | ICD-10-CM | POA: Diagnosis not present

## 2019-10-25 DIAGNOSIS — I10 Essential (primary) hypertension: Secondary | ICD-10-CM | POA: Diagnosis not present

## 2019-10-25 DIAGNOSIS — E7800 Pure hypercholesterolemia, unspecified: Secondary | ICD-10-CM | POA: Diagnosis not present

## 2019-10-25 DIAGNOSIS — E785 Hyperlipidemia, unspecified: Secondary | ICD-10-CM | POA: Diagnosis not present

## 2019-10-25 DIAGNOSIS — E1169 Type 2 diabetes mellitus with other specified complication: Secondary | ICD-10-CM | POA: Diagnosis not present

## 2019-10-25 DIAGNOSIS — J449 Chronic obstructive pulmonary disease, unspecified: Secondary | ICD-10-CM | POA: Diagnosis not present

## 2019-11-15 DIAGNOSIS — M25562 Pain in left knee: Secondary | ICD-10-CM | POA: Diagnosis not present

## 2019-12-29 DIAGNOSIS — M25562 Pain in left knee: Secondary | ICD-10-CM | POA: Diagnosis not present

## 2020-02-21 DIAGNOSIS — M25562 Pain in left knee: Secondary | ICD-10-CM | POA: Diagnosis not present

## 2020-05-29 DIAGNOSIS — M25562 Pain in left knee: Secondary | ICD-10-CM | POA: Diagnosis not present

## 2020-06-24 DIAGNOSIS — E785 Hyperlipidemia, unspecified: Secondary | ICD-10-CM | POA: Diagnosis not present

## 2020-06-24 DIAGNOSIS — E039 Hypothyroidism, unspecified: Secondary | ICD-10-CM | POA: Diagnosis not present

## 2020-06-24 DIAGNOSIS — E1169 Type 2 diabetes mellitus with other specified complication: Secondary | ICD-10-CM | POA: Diagnosis not present

## 2020-06-26 DIAGNOSIS — R634 Abnormal weight loss: Secondary | ICD-10-CM | POA: Diagnosis not present

## 2020-06-26 DIAGNOSIS — J441 Chronic obstructive pulmonary disease with (acute) exacerbation: Secondary | ICD-10-CM | POA: Diagnosis not present

## 2020-06-26 DIAGNOSIS — I10 Essential (primary) hypertension: Secondary | ICD-10-CM | POA: Diagnosis not present

## 2020-06-28 DIAGNOSIS — H2513 Age-related nuclear cataract, bilateral: Secondary | ICD-10-CM | POA: Diagnosis not present

## 2020-06-28 DIAGNOSIS — H524 Presbyopia: Secondary | ICD-10-CM | POA: Diagnosis not present

## 2020-07-11 DIAGNOSIS — E785 Hyperlipidemia, unspecified: Secondary | ICD-10-CM | POA: Diagnosis not present

## 2020-07-11 DIAGNOSIS — Z720 Tobacco use: Secondary | ICD-10-CM | POA: Diagnosis not present

## 2020-07-11 DIAGNOSIS — J449 Chronic obstructive pulmonary disease, unspecified: Secondary | ICD-10-CM | POA: Diagnosis not present

## 2020-08-28 DIAGNOSIS — F17200 Nicotine dependence, unspecified, uncomplicated: Secondary | ICD-10-CM | POA: Diagnosis not present

## 2020-08-28 DIAGNOSIS — I10 Essential (primary) hypertension: Secondary | ICD-10-CM | POA: Diagnosis not present

## 2020-08-28 DIAGNOSIS — M130 Polyarthritis, unspecified: Secondary | ICD-10-CM | POA: Diagnosis not present

## 2020-08-28 DIAGNOSIS — E039 Hypothyroidism, unspecified: Secondary | ICD-10-CM | POA: Diagnosis not present

## 2020-08-28 DIAGNOSIS — E1169 Type 2 diabetes mellitus with other specified complication: Secondary | ICD-10-CM | POA: Diagnosis not present

## 2020-08-28 DIAGNOSIS — J441 Chronic obstructive pulmonary disease with (acute) exacerbation: Secondary | ICD-10-CM | POA: Diagnosis not present

## 2020-08-28 DIAGNOSIS — E785 Hyperlipidemia, unspecified: Secondary | ICD-10-CM | POA: Diagnosis not present

## 2020-09-04 DIAGNOSIS — H2512 Age-related nuclear cataract, left eye: Secondary | ICD-10-CM | POA: Diagnosis not present

## 2020-09-04 DIAGNOSIS — H25812 Combined forms of age-related cataract, left eye: Secondary | ICD-10-CM | POA: Diagnosis not present

## 2020-09-08 DIAGNOSIS — E785 Hyperlipidemia, unspecified: Secondary | ICD-10-CM | POA: Diagnosis not present

## 2020-09-08 DIAGNOSIS — J449 Chronic obstructive pulmonary disease, unspecified: Secondary | ICD-10-CM | POA: Diagnosis not present

## 2020-09-08 DIAGNOSIS — Z720 Tobacco use: Secondary | ICD-10-CM | POA: Diagnosis not present

## 2021-01-05 DIAGNOSIS — I10 Essential (primary) hypertension: Secondary | ICD-10-CM | POA: Diagnosis not present

## 2021-01-05 DIAGNOSIS — E1169 Type 2 diabetes mellitus with other specified complication: Secondary | ICD-10-CM | POA: Diagnosis not present

## 2021-01-05 DIAGNOSIS — E785 Hyperlipidemia, unspecified: Secondary | ICD-10-CM | POA: Diagnosis not present

## 2021-01-05 DIAGNOSIS — E039 Hypothyroidism, unspecified: Secondary | ICD-10-CM | POA: Diagnosis not present

## 2021-01-07 DIAGNOSIS — I10 Essential (primary) hypertension: Secondary | ICD-10-CM | POA: Diagnosis not present

## 2021-01-07 DIAGNOSIS — J441 Chronic obstructive pulmonary disease with (acute) exacerbation: Secondary | ICD-10-CM | POA: Diagnosis not present

## 2021-01-07 DIAGNOSIS — J449 Chronic obstructive pulmonary disease, unspecified: Secondary | ICD-10-CM | POA: Diagnosis not present

## 2021-01-07 DIAGNOSIS — E1169 Type 2 diabetes mellitus with other specified complication: Secondary | ICD-10-CM | POA: Diagnosis not present

## 2021-01-07 DIAGNOSIS — T65221A Toxic effect of tobacco cigarettes, accidental (unintentional), initial encounter: Secondary | ICD-10-CM | POA: Diagnosis not present

## 2021-01-08 DIAGNOSIS — E785 Hyperlipidemia, unspecified: Secondary | ICD-10-CM | POA: Diagnosis not present

## 2021-01-08 DIAGNOSIS — Z720 Tobacco use: Secondary | ICD-10-CM | POA: Diagnosis not present

## 2021-01-08 DIAGNOSIS — J449 Chronic obstructive pulmonary disease, unspecified: Secondary | ICD-10-CM | POA: Diagnosis not present

## 2021-01-29 DIAGNOSIS — Z1231 Encounter for screening mammogram for malignant neoplasm of breast: Secondary | ICD-10-CM | POA: Diagnosis not present

## 2021-01-29 DIAGNOSIS — M810 Age-related osteoporosis without current pathological fracture: Secondary | ICD-10-CM | POA: Diagnosis not present

## 2021-01-29 DIAGNOSIS — M8589 Other specified disorders of bone density and structure, multiple sites: Secondary | ICD-10-CM | POA: Diagnosis not present

## 2021-02-18 DIAGNOSIS — R928 Other abnormal and inconclusive findings on diagnostic imaging of breast: Secondary | ICD-10-CM | POA: Diagnosis not present

## 2021-02-18 DIAGNOSIS — R922 Inconclusive mammogram: Secondary | ICD-10-CM | POA: Diagnosis not present

## 2021-03-02 DIAGNOSIS — R928 Other abnormal and inconclusive findings on diagnostic imaging of breast: Secondary | ICD-10-CM | POA: Diagnosis not present

## 2021-03-02 DIAGNOSIS — R922 Inconclusive mammogram: Secondary | ICD-10-CM | POA: Diagnosis not present

## 2021-03-11 DIAGNOSIS — Z720 Tobacco use: Secondary | ICD-10-CM | POA: Diagnosis not present

## 2021-03-11 DIAGNOSIS — E785 Hyperlipidemia, unspecified: Secondary | ICD-10-CM | POA: Diagnosis not present

## 2021-03-11 DIAGNOSIS — J449 Chronic obstructive pulmonary disease, unspecified: Secondary | ICD-10-CM | POA: Diagnosis not present

## 2021-04-10 DIAGNOSIS — Z720 Tobacco use: Secondary | ICD-10-CM | POA: Diagnosis not present

## 2021-04-10 DIAGNOSIS — E785 Hyperlipidemia, unspecified: Secondary | ICD-10-CM | POA: Diagnosis not present

## 2021-04-10 DIAGNOSIS — J449 Chronic obstructive pulmonary disease, unspecified: Secondary | ICD-10-CM | POA: Diagnosis not present

## 2021-05-08 DIAGNOSIS — I10 Essential (primary) hypertension: Secondary | ICD-10-CM | POA: Diagnosis not present

## 2021-05-08 DIAGNOSIS — E1169 Type 2 diabetes mellitus with other specified complication: Secondary | ICD-10-CM | POA: Diagnosis not present

## 2021-05-08 DIAGNOSIS — E785 Hyperlipidemia, unspecified: Secondary | ICD-10-CM | POA: Diagnosis not present

## 2021-06-10 DIAGNOSIS — E785 Hyperlipidemia, unspecified: Secondary | ICD-10-CM | POA: Diagnosis not present

## 2021-06-10 DIAGNOSIS — Z720 Tobacco use: Secondary | ICD-10-CM | POA: Diagnosis not present

## 2021-06-10 DIAGNOSIS — J449 Chronic obstructive pulmonary disease, unspecified: Secondary | ICD-10-CM | POA: Diagnosis not present

## 2021-08-28 ENCOUNTER — Ambulatory Visit (HOSPITAL_COMMUNITY)
Admission: EM | Admit: 2021-08-28 | Discharge: 2021-08-28 | Disposition: A | Discharge status: Home / Self Care | Payer: BC Managed Care – PPO | Attending: Physician Assistant | Admitting: Physician Assistant

## 2021-08-28 ENCOUNTER — Ambulatory Visit (INDEPENDENT_AMBULATORY_CARE_PROVIDER_SITE_OTHER): Payer: BC Managed Care – PPO

## 2021-08-28 ENCOUNTER — Encounter (HOSPITAL_COMMUNITY): Payer: Self-pay | Admitting: *Deleted

## 2021-08-28 DIAGNOSIS — J40 Bronchitis, not specified as acute or chronic: Secondary | ICD-10-CM

## 2021-08-28 DIAGNOSIS — R059 Cough, unspecified: Secondary | ICD-10-CM | POA: Diagnosis not present

## 2021-08-28 DIAGNOSIS — R0600 Dyspnea, unspecified: Secondary | ICD-10-CM | POA: Diagnosis not present

## 2021-08-28 MED ORDER — DOXYCYCLINE HYCLATE 100 MG PO CAPS: 100.0000 mg | ORAL_CAPSULE | Freq: Two times a day (BID) | ORAL | 0 refills | Status: DC

## 2021-08-28 MED ORDER — ALBUTEROL SULFATE HFA 108 (90 BASE) MCG/ACT IN AERS: 2.0000 | INHALATION_SPRAY | Freq: Four times a day (QID) | RESPIRATORY_TRACT | 2 refills | Status: DC | PRN

## 2021-08-28 MED ORDER — PREDNISONE 50 MG PO TABS: ORAL_TABLET | ORAL | 0 refills | Status: DC

## 2021-08-28 NOTE — Discharge Instructions (Addendum)
See Dr. Parke Simmers for a recheck next week.  Your chest xray shows a mass in your Lung and you need to have a ct scan with contrast to evaluate this area.

## 2021-08-28 NOTE — ED Triage Notes (Addendum)
C/O dyspnea intermittently over past 2 wks, worse at night -- states feels like it's asthma. C/O wheezing and deep cough. Also c/o back pain, though resolved with IBU. Denies fevers.

## 2021-08-28 NOTE — ED Provider Notes (Signed)
[facility]    CSN: [id number] Arrival date & time: 08/28/21  1509      History   Chief Complaint Chief Complaint  Patient presents with   Shortness of Breath   Cough    HPI Kristy Salazar is a 81 y.o. female.   The history is provided by the patient. No language interpreter was used.  Shortness of Breath Severity:  Moderate Onset quality:  Gradual Duration:  2 weeks Timing:  Constant Progression:  Worsening Chronicity:  New Context: URI   Relieved by:  Nothing Worsened by:  Nothing Ineffective treatments:  None tried Associated symptoms: cough   Cough Cough characteristics:  Non-productive Associated symptoms: shortness of breath    Past Medical History:  Diagnosis Date   Asthma    Diabetes mellitus    Hypertension    Seasonal allergies     Patient Active Problem List   Diagnosis Date Noted   COPD GOLD 0 still smoking  03/04/2016   Cigarette smoker 03/04/2016   Hip arthritis 05/16/2011    Past Surgical History:  Procedure Laterality Date   ABDOMINAL HYSTERECTOMY     APPENDECTOMY     BACK SURGERY     JOINT REPLACEMENT     TOTAL HIP ARTHROPLASTY     TOTAL KNEE ARTHROPLASTY      OB History   No obstetric history on file.      Home Medications    Prior to Admission medications   Medication Sig Start Date End Date Taking? Authorizing Provider  albuterol (VENTOLIN HFA) 108 (90 Base) MCG/ACT inhaler Inhale 2 puffs into the lungs every 6 (six) hours as needed for wheezing or shortness of breath. 08/28/21  Yes Caryl Ada K, PA-C  doxycycline (VIBRAMYCIN) 100 MG capsule Take 1 capsule (100 mg total) by mouth 2 (two) times daily. 08/28/21  Yes Caryl Ada K, PA-C  montelukast (SINGULAIR) 10 MG tablet Take 10 mg by mouth daily. For seasonal allergies   Yes [provider]  predniSONE (DELTASONE) 50 MG tablet One tablet a day 08/28/21  Yes Fransico Meadow, PA-C  tiotropium (SPIRIVA) 18 MCG inhalation capsule Place 18 mcg into  inhaler and inhale daily.   Yes [provider]  benzonatate (TESSALON) 100 MG capsule Take 1 capsule by mouth every 8 (eight) hours for cough. 10/31/18   Vanessa Kick, MD  cyclobenzaprine (FLEXERIL) 5 MG tablet Take 1-2 tablets (5-10 mg total) by mouth 2 (two) times daily as needed for muscle spasms. 07/30/19   Wieters, Hallie C, PA-C  Fluticasone-Umeclidin-Vilant 100-62.5-25 MCG/INH AEPB Inhale 2 application into the lungs.    [provider]  rosuvastatin (CRESTOR) 10 MG tablet Take 10 mg by mouth daily.      [provider]    Family History History reviewed. No pertinent family history.  Social History Social History   Tobacco Use   Smoking status: Every Day    Packs/day: 0.50    Years: 50.00    Pack years: 25.00    Types: Cigarettes   Smokeless tobacco: Never  Vaping Use   Vaping Use: Never used  Substance Use Topics   Alcohol use: No   Drug use: No     Allergies   Erythromycin   Review of Systems Review of Systems  Respiratory:  Positive for cough and shortness of breath.   All other systems reviewed and are negative.   Physical Exam Triage Vital Signs ED Triage Vitals  Enc Vitals Group  BP 08/28/21 1530 (!) 163/75     Pulse Rate 08/28/21 1530 76     Resp 08/28/21 1530 (!) 24     Temp 08/28/21 1530 98.1 F (36.7 C)     Temp Source 08/28/21 1530 Oral     SpO2 08/28/21 1530 95 %     Weight --      Height --      Head Circumference --      Peak Flow --      Pain Score 08/28/21 1531 0     Pain Loc --      Pain Edu? --      Excl. in [place]? --    No data found.  Updated Vital Signs BP (!) 163/75    Pulse 76    Temp 98.1 F (36.7 C) (Oral)    Resp (!) 24    SpO2 95%   Visual Acuity Right Eye Distance:   Left Eye Distance:   Bilateral Distance:    Right Eye Near:   Left Eye Near:    Bilateral Near:     Physical Exam Vitals and nursing note reviewed.  Constitutional:      Appearance: Kristy Salazar is well-developed.  HENT:      Head: Normocephalic.  Cardiovascular:     Rate and Rhythm: Normal rate and regular rhythm.  Pulmonary:     Effort: Pulmonary effort is normal.     Breath sounds: Decreased breath sounds present.  Abdominal:     General: There is no distension.     Palpations: Abdomen is soft.  Musculoskeletal:        General: Normal range of motion.     Cervical back: Normal range of motion.  Skin:    General: Skin is warm.  Neurological:     General: No focal deficit present.     Mental Status: Kristy Salazar is alert and oriented to person, place, and time.  Psychiatric:        Mood and Affect: Mood normal.     UC Treatments / Results  Labs (all labs ordered are listed, but only abnormal results are displayed) Labs Reviewed - No data to display  EKG   Radiology DG Chest 2 View  Result Date: 08/28/2021 CLINICAL DATA:  Cough.  Intermittent dyspnea over the last 2 weeks. EXAM: CHEST - 2 VIEW COMPARISON:  10/31/2018 FINDINGS: Lungs are hyperexpanded. Interstitial markings are diffusely coarsened with chronic features. 2.1 cm nodular opacity is identified in the posterior right lower lobe. The cardiopericardial silhouette is within normal limits for size. Bones are diffusely demineralized. IMPRESSION: 2.1 cm nodular opacity in the posterior right lower lobe. CT chest without contrast recommended to further evaluate. These results will be called to the ordering clinician or representative by the Radiologist Assistant, and communication documented in the PACS or [employer]. Electronically Signed   By: Misty Stanley M.D.   On: 08/28/2021 17:17    Procedures Procedures (including critical care time)  Medications Ordered in UC Medications - No data to display  Initial Impression / Assessment and Plan / UC Course  I have reviewed the triage vital signs and the nursing notes.  Pertinent labs & imaging results that were available during my care of the patient were reviewed by me and considered in my  medical decision making (see chart for details).     MDM chest x-ray shows a 2 cm nodule in patient's right lower lobe.  Radiologist recommended a CT chest.  Discussed patient's results with  her patient is advised to call Dr. Criss Rosales tomorrow to schedule appointment for follow-up Kristy Salazar is advised Kristy Salazar will need a CT scan patient is given a prescription for doxycycline prednisone and albuterol Kristy Salazar is advised to return if symptoms worsen or change go to the emergency department if any difficulty breathing Final Clinical Impressions(s) / UC Diagnoses   Final diagnoses:  Bronchitis     Discharge Instructions      See Dr. Criss Rosales for a recheck next week.  Your chest xray shows a mass in your Lung and you need to have a ct scan with contrast to evaluate this area.    ED Prescriptions     Medication Sig Dispense Auth. Provider   doxycycline (VIBRAMYCIN) 100 MG capsule Take 1 capsule (100 mg total) by mouth 2 (two) times daily. 20 capsule Kennice Finnie K, [place]   predniSONE (DELTASONE) 50 MG tablet One tablet a day 5 tablet Winifred Balogh K, PA-C   albuterol (VENTOLIN HFA) 108 (90 Base) MCG/ACT inhaler Inhale 2 puffs into the lungs every 6 (six) hours as needed for wheezing or shortness of breath. 8 g Fransico Meadow, [place]      PDMP not reviewed this encounter.   Fransico Meadow, [place] 08/28/21 2103

## 2021-09-09 DIAGNOSIS — R922 Inconclusive mammogram: Secondary | ICD-10-CM | POA: Diagnosis not present

## 2021-09-09 DIAGNOSIS — R928 Other abnormal and inconclusive findings on diagnostic imaging of breast: Secondary | ICD-10-CM | POA: Diagnosis not present

## 2021-09-24 ENCOUNTER — Encounter (HOSPITAL_COMMUNITY): Payer: Self-pay | Admitting: Radiology

## 2021-10-09 DIAGNOSIS — J449 Chronic obstructive pulmonary disease, unspecified: Secondary | ICD-10-CM | POA: Diagnosis not present

## 2021-10-09 DIAGNOSIS — E785 Hyperlipidemia, unspecified: Secondary | ICD-10-CM | POA: Diagnosis not present

## 2021-11-20 DIAGNOSIS — E1169 Type 2 diabetes mellitus with other specified complication: Secondary | ICD-10-CM | POA: Diagnosis not present

## 2021-11-20 DIAGNOSIS — M130 Polyarthritis, unspecified: Secondary | ICD-10-CM | POA: Diagnosis not present

## 2021-11-20 DIAGNOSIS — J449 Chronic obstructive pulmonary disease, unspecified: Secondary | ICD-10-CM | POA: Diagnosis not present

## 2021-11-20 DIAGNOSIS — R5383 Other fatigue: Secondary | ICD-10-CM | POA: Diagnosis not present

## 2021-11-20 DIAGNOSIS — I10 Essential (primary) hypertension: Secondary | ICD-10-CM | POA: Diagnosis not present

## 2022-02-05 DIAGNOSIS — R000 Tachycardia, unspecified: Secondary | ICD-10-CM | POA: Diagnosis not present

## 2022-02-05 DIAGNOSIS — Z720 Tobacco use: Secondary | ICD-10-CM | POA: Diagnosis not present

## 2022-02-05 DIAGNOSIS — E785 Hyperlipidemia, unspecified: Secondary | ICD-10-CM | POA: Diagnosis not present

## 2022-02-05 DIAGNOSIS — J441 Chronic obstructive pulmonary disease with (acute) exacerbation: Secondary | ICD-10-CM | POA: Diagnosis not present

## 2022-02-19 DIAGNOSIS — J441 Chronic obstructive pulmonary disease with (acute) exacerbation: Secondary | ICD-10-CM | POA: Diagnosis not present

## 2022-02-19 DIAGNOSIS — M130 Polyarthritis, unspecified: Secondary | ICD-10-CM | POA: Diagnosis not present

## 2022-02-19 DIAGNOSIS — I10 Essential (primary) hypertension: Secondary | ICD-10-CM | POA: Diagnosis not present

## 2022-02-19 DIAGNOSIS — E1169 Type 2 diabetes mellitus with other specified complication: Secondary | ICD-10-CM | POA: Diagnosis not present

## 2022-03-05 DIAGNOSIS — E1169 Type 2 diabetes mellitus with other specified complication: Secondary | ICD-10-CM | POA: Diagnosis not present

## 2022-03-05 DIAGNOSIS — J441 Chronic obstructive pulmonary disease with (acute) exacerbation: Secondary | ICD-10-CM | POA: Diagnosis not present

## 2022-03-05 DIAGNOSIS — I10 Essential (primary) hypertension: Secondary | ICD-10-CM | POA: Diagnosis not present

## 2022-06-07 DIAGNOSIS — E7800 Pure hypercholesterolemia, unspecified: Secondary | ICD-10-CM | POA: Diagnosis not present

## 2022-06-07 DIAGNOSIS — J441 Chronic obstructive pulmonary disease with (acute) exacerbation: Secondary | ICD-10-CM | POA: Diagnosis not present

## 2022-06-07 DIAGNOSIS — I10 Essential (primary) hypertension: Secondary | ICD-10-CM | POA: Diagnosis not present

## 2022-06-07 DIAGNOSIS — E1169 Type 2 diabetes mellitus with other specified complication: Secondary | ICD-10-CM | POA: Diagnosis not present

## 2022-06-07 DIAGNOSIS — E039 Hypothyroidism, unspecified: Secondary | ICD-10-CM | POA: Diagnosis not present

## 2022-06-07 DIAGNOSIS — T65221D Toxic effect of tobacco cigarettes, accidental (unintentional), subsequent encounter: Secondary | ICD-10-CM | POA: Diagnosis not present

## 2022-06-24 DIAGNOSIS — E1169 Type 2 diabetes mellitus with other specified complication: Secondary | ICD-10-CM | POA: Diagnosis not present

## 2022-06-28 DIAGNOSIS — Z0000 Encounter for general adult medical examination without abnormal findings: Secondary | ICD-10-CM | POA: Diagnosis not present

## 2022-07-10 DIAGNOSIS — E785 Hyperlipidemia, unspecified: Secondary | ICD-10-CM | POA: Diagnosis not present

## 2022-07-10 DIAGNOSIS — I10 Essential (primary) hypertension: Secondary | ICD-10-CM | POA: Diagnosis not present

## 2022-10-04 DIAGNOSIS — E039 Hypothyroidism, unspecified: Secondary | ICD-10-CM | POA: Diagnosis not present

## 2022-10-04 DIAGNOSIS — E1169 Type 2 diabetes mellitus with other specified complication: Secondary | ICD-10-CM | POA: Diagnosis not present

## 2022-10-04 DIAGNOSIS — I10 Essential (primary) hypertension: Secondary | ICD-10-CM | POA: Diagnosis not present

## 2022-10-04 DIAGNOSIS — E7800 Pure hypercholesterolemia, unspecified: Secondary | ICD-10-CM | POA: Diagnosis not present

## 2022-10-04 DIAGNOSIS — J449 Chronic obstructive pulmonary disease, unspecified: Secondary | ICD-10-CM | POA: Diagnosis not present

## 2022-10-04 DIAGNOSIS — E785 Hyperlipidemia, unspecified: Secondary | ICD-10-CM | POA: Diagnosis not present

## 2022-10-04 DIAGNOSIS — R634 Abnormal weight loss: Secondary | ICD-10-CM | POA: Diagnosis not present

## 2022-10-08 ENCOUNTER — Encounter: Payer: Self-pay | Admitting: Family Medicine

## 2022-10-18 DIAGNOSIS — Z1212 Encounter for screening for malignant neoplasm of rectum: Secondary | ICD-10-CM | POA: Diagnosis not present

## 2022-10-18 DIAGNOSIS — Z1211 Encounter for screening for malignant neoplasm of colon: Secondary | ICD-10-CM | POA: Diagnosis not present

## 2022-11-26 ENCOUNTER — Encounter (HOSPITAL_COMMUNITY): Payer: Self-pay

## 2022-11-26 ENCOUNTER — Ambulatory Visit (HOSPITAL_COMMUNITY)
Admission: EM | Admit: 2022-11-26 | Discharge: 2022-11-26 | Disposition: A | Discharge status: Home / Self Care | Payer: BC Managed Care – PPO | Attending: Emergency Medicine | Admitting: Emergency Medicine

## 2022-11-26 ENCOUNTER — Ambulatory Visit (INDEPENDENT_AMBULATORY_CARE_PROVIDER_SITE_OTHER): Payer: BC Managed Care – PPO

## 2022-11-26 DIAGNOSIS — M7989 Other specified soft tissue disorders: Secondary | ICD-10-CM | POA: Diagnosis not present

## 2022-11-26 DIAGNOSIS — R2241 Localized swelling, mass and lump, right lower limb: Secondary | ICD-10-CM

## 2022-11-26 DIAGNOSIS — S99921A Unspecified injury of right foot, initial encounter: Secondary | ICD-10-CM

## 2022-11-26 DIAGNOSIS — M79671 Pain in right foot: Secondary | ICD-10-CM | POA: Diagnosis not present

## 2022-11-26 NOTE — Discharge Instructions (Addendum)
Your x-ray was negative for fracture or dislocation.  I suspect your soft tissue swelling and bruising are from the trauma of dropping the soda on your foot.  Please rest, ice, compress and elevate.  You can take Tylenol as needed for your pain.  If your pain persist beyond the next few weeks, please follow-up with orthopedic provider for further evaluation.  You can return to clinic for any new or concerning symptoms.

## 2022-11-26 NOTE — ED Provider Notes (Signed)
[facility]    CSN: [id number] Arrival date & time: 11/26/22  1542      History   Chief Complaint Chief Complaint  Patient presents with   Foot Pain    HPI Kristy Salazar is a 82 y.o. female.   Patient presents to clinic for complaint of a right foot pain.  Over the weekend she dropped to 2 L sodas on her foot.  Since then she is having right foot swelling, pain, pain with walking and tenderness.  Reports the area is bruised.  No previous breaks or injuries to this foot.  She denies a history of type 2 diabetes or osteoporosis.  Ambulatory.  Took Tylenol for her pain, which has helped some.    The history is provided by the patient and medical records.  Foot Pain    Past Medical History:  Diagnosis Date   Asthma    Diabetes mellitus    Hypertension    Seasonal allergies     Patient Active Problem List   Diagnosis Date Noted   COPD GOLD 0 still smoking  03/04/2016   Cigarette smoker 03/04/2016   Hip arthritis 05/16/2011    Past Surgical History:  Procedure Laterality Date   ABDOMINAL HYSTERECTOMY     APPENDECTOMY     BACK SURGERY     JOINT REPLACEMENT     TOTAL HIP ARTHROPLASTY     TOTAL KNEE ARTHROPLASTY      OB History   No obstetric history on file.      Home Medications    Prior to Admission medications   Medication Sig Start Date End Date Taking? Authorizing Provider  albuterol (VENTOLIN HFA) 108 (90 Base) MCG/ACT inhaler Inhale 2 puffs into the lungs every 6 (six) hours as needed for wheezing or shortness of breath. 08/28/21  Yes Cheron Schaumann K, PA-C  benzonatate (TESSALON) 100 MG capsule Take 1 capsule by mouth every 8 (eight) hours for cough. 10/31/18  Yes Mardella Layman, MD  cyclobenzaprine (FLEXERIL) 5 MG tablet Take 1-2 tablets (5-10 mg total) by mouth 2 (two) times daily as needed for muscle spasms. 07/30/19  Yes Wieters, Hallie C, PA-C  Fluticasone-Umeclidin-Vilant 100-62.5-25 MCG/INH AEPB Inhale 2 application into the lungs.   Yes  [provider]  montelukast (SINGULAIR) 10 MG tablet Take 10 mg by mouth daily. For seasonal allergies   Yes [provider]  rosuvastatin (CRESTOR) 10 MG tablet Take 10 mg by mouth daily.     Yes [provider]  tiotropium (SPIRIVA) 18 MCG inhalation capsule Place 18 mcg into inhaler and inhale daily.   Yes [provider]    Family History History reviewed. No pertinent family history.  Social History Social History   Tobacco Use   Smoking status: Every Day    Packs/day: 0.50    Years: 50.00    Additional pack years: 0.00    Total pack years: 25.00    Types: Cigarettes   Smokeless tobacco: Never  Vaping Use   Vaping Use: Never used  Substance Use Topics   Alcohol use: No   Drug use: No     Allergies   Erythromycin   Review of Systems Review of Systems  Constitutional:  Negative for chills and fever.  Musculoskeletal:  Positive for joint swelling.     Physical Exam Triage Vital Signs ED Triage Vitals  Enc Vitals Group     BP 11/26/22 1620 130/67     Pulse Rate 11/26/22 1620 77  Resp 11/26/22 1620 18     Temp 11/26/22 1620 98.2 F (36.8 C)     Temp Source 11/26/22 1620 Oral     SpO2 11/26/22 1620 95 %     Weight --      Height --      Head Circumference --      Peak Flow --      Pain Score 11/26/22 1618 8     Pain Loc --      Pain Edu? --      Excl. in GC? --    No data found.  Updated Vital Signs BP 130/67 (BP Location: Right Arm)   Pulse 77   Temp 98.2 F (36.8 C) (Oral)   Resp 18   SpO2 95%   Visual Acuity Right Eye Distance:   Left Eye Distance:   Bilateral Distance:    Right Eye Near:   Left Eye Near:    Bilateral Near:     Physical Exam Vitals and nursing note reviewed.  Constitutional:      Appearance: Normal appearance.  HENT:     Head: Normocephalic and atraumatic.     Right Ear: External ear normal.     Left Ear: External ear normal.     Nose: Nose normal.     Mouth/Throat:      Mouth: Mucous membranes are moist.  Eyes:     Conjunctiva/sclera: Conjunctivae normal.  Cardiovascular:     Rate and Rhythm: Normal rate.     Pulses:          Dorsalis pedis pulses are 2+ on the right side.       Posterior tibial pulses are 2+ on the right side.  Pulmonary:     Effort: Pulmonary effort is normal. No respiratory distress.  Musculoskeletal:        General: Swelling, tenderness and signs of injury present. No deformity. Normal range of motion.       Feet:  Feet:     Comments: TTP over 2nd and 3rd metatarsal.  Associated soft tissue edema and bruising. Skin:    General: Skin is warm and dry.     Capillary Refill: Capillary refill takes less than 2 seconds.     Findings: Bruising present.  Neurological:     General: No focal deficit present.     Mental Status: She is alert and oriented to person, place, and time.  Psychiatric:        Mood and Affect: Mood normal.        Behavior: Behavior normal. Behavior is cooperative.      UC Treatments / Results  Labs (all labs ordered are listed, but only abnormal results are displayed) Labs Reviewed - No data to display  EKG   Radiology DG Foot Complete Right  Result Date: 11/26/2022 CLINICAL DATA:  Right foot pain and swelling after dropping a bag of sodas on it on Sunday. EXAM: RIGHT FOOT COMPLETE - 3+ VIEW COMPARISON:  Right foot x-rays dated July 18, 2012. FINDINGS: There is no evidence of fracture or dislocation. There is no evidence of arthropathy or other focal bone abnormality. Soft tissues are unremarkable. IMPRESSION: Negative. Electronically Signed   By: Obie Dredge M.D.   On: 11/26/2022 16:37    Procedures Procedures (including critical care time)  Medications Ordered in UC Medications - No data to display  Initial Impression / Assessment and Plan / UC Course  I have reviewed the triage vital signs and the nursing notes.  Pertinent labs & imaging results that were available during my care of the  patient were reviewed by me and considered in my medical decision making (see chart for details).  Vitals and triage reviewed, patient is hemodynamically stable.  Right foot with soft tissue swelling, TTP over second and third metatarsal with associated ecchymosis.  Imaging negative for fracture or dislocation.  Suggested RICE and Ortho follow-up if no improvement.  Given Ace wrap in clinic.  Plan of care, follow-up care and return precautions reviewed, no questions at this time.    Final Clinical Impressions(s) / UC Diagnoses   Final diagnoses:  Right foot injury, initial encounter  Localized swelling of right foot     Discharge Instructions      Your x-ray was negative for fracture or dislocation.  I suspect your soft tissue swelling and bruising are from the trauma of dropping the soda on your foot.  Please rest, ice, compress and elevate.  You can take Tylenol as needed for your pain.  If your pain persist beyond the next few weeks, please follow-up with orthopedic provider for further evaluation.  You can return to clinic for any new or concerning symptoms.     ED Prescriptions   None    PDMP not reviewed this encounter.   Darya Bigler, [place] N, [place] 11/26/22 [phone]

## 2022-11-26 NOTE — ED Triage Notes (Signed)
Patient dropped a bag with two 2L sodas in it on the right foot Sunday. Having swelling, pain, and having to walk on the heel of the foot. No h/o issues with the right foot.

## 2023-02-03 DIAGNOSIS — M130 Polyarthritis, unspecified: Secondary | ICD-10-CM | POA: Diagnosis not present

## 2023-02-03 DIAGNOSIS — I509 Heart failure, unspecified: Secondary | ICD-10-CM | POA: Diagnosis not present

## 2023-02-03 DIAGNOSIS — T65221A Toxic effect of tobacco cigarettes, accidental (unintentional), initial encounter: Secondary | ICD-10-CM | POA: Diagnosis not present

## 2023-02-03 DIAGNOSIS — E1169 Type 2 diabetes mellitus with other specified complication: Secondary | ICD-10-CM | POA: Diagnosis not present

## 2023-02-03 DIAGNOSIS — R609 Edema, unspecified: Secondary | ICD-10-CM | POA: Diagnosis not present

## 2023-02-03 DIAGNOSIS — E785 Hyperlipidemia, unspecified: Secondary | ICD-10-CM | POA: Diagnosis not present

## 2023-02-03 DIAGNOSIS — J449 Chronic obstructive pulmonary disease, unspecified: Secondary | ICD-10-CM | POA: Diagnosis not present

## 2023-02-03 DIAGNOSIS — E039 Hypothyroidism, unspecified: Secondary | ICD-10-CM | POA: Diagnosis not present

## 2023-02-03 DIAGNOSIS — I10 Essential (primary) hypertension: Secondary | ICD-10-CM | POA: Diagnosis not present

## 2023-02-05 ENCOUNTER — Encounter (HOSPITAL_COMMUNITY): Payer: Self-pay

## 2023-02-05 ENCOUNTER — Ambulatory Visit (INDEPENDENT_AMBULATORY_CARE_PROVIDER_SITE_OTHER): Payer: BC Managed Care – PPO

## 2023-02-05 ENCOUNTER — Ambulatory Visit (HOSPITAL_COMMUNITY)
Admission: EM | Admit: 2023-02-05 | Discharge: 2023-02-05 | Disposition: A | Discharge status: Home / Self Care | Payer: BC Managed Care – PPO | Attending: Family Medicine | Admitting: Family Medicine

## 2023-02-05 DIAGNOSIS — M7989 Other specified soft tissue disorders: Secondary | ICD-10-CM

## 2023-02-05 DIAGNOSIS — M79672 Pain in left foot: Secondary | ICD-10-CM

## 2023-02-05 MED ORDER — FUROSEMIDE 20 MG PO TABS: ORAL_TABLET | ORAL | 0 refills | Status: DC

## 2023-02-05 NOTE — ED Triage Notes (Signed)
Patient here today with c/o left foot pain and swelling X 2 weeks. No known injury. She has been taking IBU with some relief. Patient went to PCP Thursday and had labs done.

## 2023-02-07 NOTE — ED Provider Notes (Signed)
[facility]   [id number] 02/05/23 Arrival Time: 1313  ASSESSMENT & PLAN:  1. Foot pain, left   2. Swelling of left foot   3. Left foot pain    I have personally viewed and independently interpreted the imaging studies ordered this visit. LEFT foot: no acute bony changes appreciated.  No suspicion for DVT. Begin trial of: Discharge Medication List as of 02/05/2023  3:47 PM     START taking these medications   Details  furosemide (LASIX) 20 MG tablet Take 1-2 tablets in the morning as needed for your foot swelling., Normal       Orders Placed This Encounter  Procedures   DG Foot 2 Views Left   Apply CAM boot  Boot for comfort/ambulatory assistance.  Recommend:  Follow-up Information     Schedule an appointment as soon as possible for a visit  with [name], MD.   Specialty: Family Medicine Contact information: [address] [place] [address] [phone]                   Reviewed expectations re: course of current medical issues. Questions answered. Outlined signs and symptoms indicating need for more acute intervention. Patient verbalized understanding. After Visit Summary given.  SUBJECTIVE: History from: patient. Kristy Salazar is a 82 y.o. female who reports left foot pain and swelling X 2 weeks. No known injury/trauma. She has been taking IBU with some relief. Patient went to PCP Thursday and had labs done.  Will be flying to [place] later next week. Swelling is better every morning. Denies CP/SOB. Denies calf swelling/pain.  Past Surgical History:  Procedure Laterality Date   ABDOMINAL HYSTERECTOMY     APPENDECTOMY     BACK SURGERY     JOINT REPLACEMENT     TOTAL HIP ARTHROPLASTY     TOTAL KNEE ARTHROPLASTY        OBJECTIVE:  Vitals:   02/05/23 1345  BP: (!) 156/68  Pulse: 74  Resp: 16  Temp: 97.9 F (36.6 C)  TempSrc: Oral  SpO2: 95%  Weight: 54.4 kg  Height: 5\' 8"  (1.727 m)    General appearance:  alert; no distress HEENT: [place]; AT Neck: supple with FROM Resp: unlabored respirations Extremities: LLE: warm with well perfused appearance; Foot with 1+ pitting edema; TTP over dorsal foot; 1+ DP pulse CV: brisk extremity capillary refill of LLE Skin: warm and dry; no visible rashes Neurologic: gait normal; normal sensation and strength of LLE Psychological: alert and cooperative; normal mood and affect  Imaging: DG Foot 2 Views Left  Result Date: 02/05/2023 CLINICAL DATA:  Left foot pain and swelling for 2 weeks EXAM: LEFT FOOT - 2 VIEW COMPARISON:  10/31/2011 FINDINGS: There is no evidence of fracture or dislocation. There is no evidence of significant arthropathy or other focal bone abnormality. Mild soft tissue swelling. IMPRESSION: Negative. Electronically Signed   By: Duanne Guess D.O.   On: 02/05/2023 14:47       Allergies  Allergen Reactions   Erythromycin Hives    Past Medical History:  Diagnosis Date   Asthma    Diabetes mellitus    Hypertension    Seasonal allergies    Social History   Socioeconomic History   Marital status: Legally Separated    Spouse name: Not on file   Number of children: Not on file   Years of education: Not on file   Highest education level: Not on file  Occupational History  Not on file  Tobacco Use   Smoking status: Every Day    Current packs/day: 0.50    Average packs/day: 0.5 packs/day for 50.0 years (25.0 ttl pk-yrs)    Types: Cigarettes   Smokeless tobacco: Never  Vaping Use   Vaping status: Never Used  Substance and Sexual Activity   Alcohol use: No   Drug use: No   Sexual activity: Not on file  Other Topics Concern   Not on file  Social History Narrative   Not on file   Social Determinants of Health   Financial Resource Strain: Not on file  Food Insecurity: Not on file  Transportation Needs: Not on file  Physical Activity: Not on file  Stress: Not on file  Social Connections: Not on file   History  reviewed. No pertinent family history. Past Surgical History:  Procedure Laterality Date   ABDOMINAL HYSTERECTOMY     APPENDECTOMY     BACK SURGERY     JOINT REPLACEMENT     TOTAL HIP ARTHROPLASTY     TOTAL KNEE ARTHROPLASTY         Mardella Layman, MD 02/07/23 [phone]

## 2023-03-09 ENCOUNTER — Encounter (HOSPITAL_COMMUNITY): Payer: Self-pay

## 2023-03-09 ENCOUNTER — Ambulatory Visit (INDEPENDENT_AMBULATORY_CARE_PROVIDER_SITE_OTHER): Payer: BC Managed Care – PPO

## 2023-03-09 ENCOUNTER — Ambulatory Visit (HOSPITAL_COMMUNITY)
Admission: EM | Admit: 2023-03-09 | Discharge: 2023-03-09 | Disposition: A | Discharge status: Home / Self Care | Payer: BC Managed Care – PPO | Attending: Family Medicine | Admitting: Family Medicine

## 2023-03-09 DIAGNOSIS — R0602 Shortness of breath: Secondary | ICD-10-CM

## 2023-03-09 DIAGNOSIS — I700 Atherosclerosis of aorta: Secondary | ICD-10-CM | POA: Diagnosis not present

## 2023-03-09 DIAGNOSIS — J441 Chronic obstructive pulmonary disease with (acute) exacerbation: Secondary | ICD-10-CM

## 2023-03-09 DIAGNOSIS — R062 Wheezing: Secondary | ICD-10-CM | POA: Diagnosis not present

## 2023-03-09 DIAGNOSIS — R918 Other nonspecific abnormal finding of lung field: Secondary | ICD-10-CM | POA: Diagnosis not present

## 2023-03-09 DIAGNOSIS — R0902 Hypoxemia: Secondary | ICD-10-CM | POA: Diagnosis not present

## 2023-03-09 MED ORDER — IPRATROPIUM-ALBUTEROL 0.5-2.5 (3) MG/3ML IN SOLN
RESPIRATORY_TRACT | Status: AC
  Filled 2023-03-09: qty 3

## 2023-03-09 MED ORDER — METHYLPREDNISOLONE SODIUM SUCC 125 MG IJ SOLR
INTRAMUSCULAR | Status: AC
  Filled 2023-03-09: qty 2

## 2023-03-09 MED ORDER — PREDNISONE 50 MG PO TABS: ORAL_TABLET | ORAL | 0 refills | Status: DC

## 2023-03-09 MED ORDER — METHYLPREDNISOLONE SODIUM SUCC 125 MG IJ SOLR
60.0000 mg | Freq: Once | INTRAMUSCULAR | Status: AC
  Administered 2023-03-09: 60 mg via INTRAMUSCULAR

## 2023-03-09 MED ORDER — IPRATROPIUM-ALBUTEROL 0.5-2.5 (3) MG/3ML IN SOLN
3.0000 mL | Freq: Once | RESPIRATORY_TRACT | Status: AC
  Administered 2023-03-09: 3 mL via RESPIRATORY_TRACT

## 2023-03-09 NOTE — ED Triage Notes (Signed)
Patient presents with shortness of breath x day 2.

## 2023-03-09 NOTE — Discharge Instructions (Signed)
Your chest x-ray showed an enlarging lung mass. This is concerning for lung cancer. Make sure you call your PCP tomorrow morning.

## 2023-03-09 NOTE — ED Notes (Signed)
Patient 90% on RA. Patient with labored breathing. Dr. Tracie Harrier made aware.

## 2023-03-10 NOTE — ED Provider Notes (Signed)
[facility]ARE [facility]ENTER   [id number] 03/09/23 Arrival Time: 1433  ASSESSMENT & PLAN:  1. SOB (shortness of breath)   2. Wheezing   3. COPD exacerbation (HCC)   4. Right lower lobe lung mass    Mass is suspicious for adenocarcinoma of lung; discussed.   Discharge Instructions      Your chest x-ray showed an enlarging lung mass. This is concerning for lung cancer. Make sure you call your PCP tomorrow morning.    Copy of radiology report given to pt. Has f/u with PCP next week but will call them tomorrow am. May be able to get CT set up before her appt.  Acute symptoms better after solu-medrol and duoneb x 2.  I have personally viewed and independently interpreted the imaging studies ordered this visit. No acute changes. RLL mass present; enlarged from 2023 CXR.  Meds ordered this encounter  Medications   ipratropium-albuterol (DUONEB) 0.5-2.5 (3) MG/3ML nebulizer solution 3 mL   methylPREDNISolone sodium succinate (SOLU-MEDROL) 125 mg/2 mL injection 60 mg   ipratropium-albuterol (DUONEB) 0.5-2.5 (3) MG/3ML nebulizer solution 3 mL   predniSONE (DELTASONE) 50 MG tablet    Sig: Take one tablet by mouth for 5 days.    Dispense:  5 tablet    Refill:  0   Breathing precautions given. OTC symptom care as needed.  Recommend:  Follow-up Information     [name], MD.   Specialty: Family Medicine Why: Call Dr Parke Simmers tomorrow to ask her about setting up a CT scan. Keep your appt with her next week. Contact information: [address] [place] [address] [phone]         [facility] Emergency Department at [facility].   Specialty: Emergency Medicine Why: If symptoms worsen in any way. Contact information: [address] [place] [address] [phone]                Reviewed expectations re: course of current medical issues. Questions answered. Outlined signs and symptoms indicating need for more acute  intervention. Patient verbalized understanding. After Visit Summary given.  SUBJECTIVE: History from: patient.  Kristy Salazar is a 82 y.o. female who presents with complaint of SOB x 1-2 d. Non-productive coughing. Denies CP. Denies fever. Does feel weak. Using albuterol inhaler frequently. Social History   Tobacco Use  Smoking Status Every Day   Current packs/day: 0.50   Average packs/day: 0.5 packs/day for 50.0 years (25.0 ttl pk-yrs)   Types: Cigarettes  Smokeless Tobacco Never   OBJECTIVE:  Vitals:   03/09/23 1443 03/09/23 1446 03/09/23 1500 03/09/23 1614  BP: (!) 135/58     Pulse: 91     Temp: 98.8 F (37.1 C)     TempSrc: Oral     SpO2: 91%  92% 95%  Weight:  56.7 kg    Height:  5' 9.5" (1.765 m)      General appearance: alert HEENT: [name]; AT; with mild nasal congestion Neck: supple without LAD CV: RRR Lungs: mildly labored respirations, moderate bilateral expiratory wheezing; cough: dry and frequent Skin: warm and dry Psychological: alert and cooperative; normal mood and affect  Imaging: DG Chest 2 View  Result Date: 03/09/2023 CLINICAL DATA:  Shortness of breath for 2 days.  Hypoxia. EXAM: CHEST - 2 VIEW COMPARISON:  Two-view chest x-ray 08/28/2021 FINDINGS: The heart size is normal. Atherosclerotic calcifications are present at the aortic arch. A mass lesion posteriorly in the right lower lobe has grown, now  measuring 5.7 x 5.3 x 5.1 cm. No other focal nodule or mass lesion is present. Fullness in the right hilum may represent adenopathy. The visualized soft tissues and bony thorax are unremarkable. IMPRESSION: 1. Right lower lobe mass lesion, measuring 5.7 x 5.3 x 5.1 cm is highly concerning for a primary lung neoplasm. Recommend CT of the chest with contrast for further evaluation. 2. Possible right hilar adenopathy. 3. Aortic atherosclerosis. Electronically Signed   By: Marin Roberts M.D.   On: 03/09/2023 15:49    Allergies  Allergen Reactions    Erythromycin Hives    Past Medical History:  Diagnosis Date   Asthma    Diabetes mellitus    Hypertension    Seasonal allergies    No family history on file. Social History   Socioeconomic History   Marital status: Legally Separated    Spouse name: Not on file   Number of children: Not on file   Years of education: Not on file   Highest education level: Not on file  Occupational History   Not on file  Tobacco Use   Smoking status: Every Day    Current packs/day: 0.50    Average packs/day: 0.5 packs/day for 50.0 years (25.0 ttl pk-yrs)    Types: Cigarettes   Smokeless tobacco: Never  Vaping Use   Vaping status: Never Used  Substance and Sexual Activity   Alcohol use: No   Drug use: No   Sexual activity: Not on file  Other Topics Concern   Not on file  Social History Narrative   Not on file   Social Determinants of Health   Financial Resource Strain: Not on file  Food Insecurity: Not on file  Transportation Needs: Not on file  Physical Activity: Not on file  Stress: Not on file  Social Connections: Not on file  Intimate Partner Violence: Not on file             Mardella Layman, MD 03/10/23 1045

## 2023-03-15 DIAGNOSIS — Z20822 Contact with and (suspected) exposure to covid-19: Secondary | ICD-10-CM | POA: Diagnosis not present

## 2023-03-15 DIAGNOSIS — J449 Chronic obstructive pulmonary disease, unspecified: Secondary | ICD-10-CM | POA: Diagnosis not present

## 2023-03-15 DIAGNOSIS — I10 Essential (primary) hypertension: Secondary | ICD-10-CM | POA: Diagnosis not present

## 2023-03-15 DIAGNOSIS — E1169 Type 2 diabetes mellitus with other specified complication: Secondary | ICD-10-CM | POA: Diagnosis not present

## 2023-03-15 DIAGNOSIS — R634 Abnormal weight loss: Secondary | ICD-10-CM | POA: Diagnosis not present

## 2023-03-15 DIAGNOSIS — T65221D Toxic effect of tobacco cigarettes, accidental (unintentional), subsequent encounter: Secondary | ICD-10-CM | POA: Diagnosis not present

## 2023-03-15 DIAGNOSIS — M130 Polyarthritis, unspecified: Secondary | ICD-10-CM | POA: Diagnosis not present

## 2023-03-15 DIAGNOSIS — R918 Other nonspecific abnormal finding of lung field: Secondary | ICD-10-CM | POA: Diagnosis not present

## 2023-03-16 ENCOUNTER — Encounter: Payer: Self-pay | Admitting: Family Medicine

## 2023-03-16 ENCOUNTER — Other Ambulatory Visit: Payer: Self-pay | Admitting: Family Medicine

## 2023-03-16 DIAGNOSIS — D1430 Benign neoplasm of unspecified bronchus and lung: Secondary | ICD-10-CM

## 2023-03-16 DIAGNOSIS — R918 Other nonspecific abnormal finding of lung field: Secondary | ICD-10-CM

## 2023-03-24 DIAGNOSIS — R918 Other nonspecific abnormal finding of lung field: Secondary | ICD-10-CM | POA: Diagnosis not present

## 2023-03-24 DIAGNOSIS — J441 Chronic obstructive pulmonary disease with (acute) exacerbation: Secondary | ICD-10-CM | POA: Diagnosis not present

## 2023-03-24 DIAGNOSIS — I10 Essential (primary) hypertension: Secondary | ICD-10-CM | POA: Diagnosis not present

## 2023-03-24 DIAGNOSIS — E1169 Type 2 diabetes mellitus with other specified complication: Secondary | ICD-10-CM | POA: Diagnosis not present

## 2023-03-24 DIAGNOSIS — E039 Hypothyroidism, unspecified: Secondary | ICD-10-CM | POA: Diagnosis not present

## 2023-03-31 ENCOUNTER — Ambulatory Visit
Admission: RE | Admit: 2023-03-31 | Discharge: 2023-03-31 | Disposition: A | Discharge status: Home / Self Care | Payer: BC Managed Care – PPO | Source: Ambulatory Visit | Attending: Family Medicine | Admitting: Family Medicine

## 2023-03-31 DIAGNOSIS — D1430 Benign neoplasm of unspecified bronchus and lung: Secondary | ICD-10-CM

## 2023-03-31 DIAGNOSIS — J432 Centrilobular emphysema: Secondary | ICD-10-CM | POA: Diagnosis not present

## 2023-03-31 DIAGNOSIS — R918 Other nonspecific abnormal finding of lung field: Secondary | ICD-10-CM | POA: Diagnosis not present

## 2023-03-31 DIAGNOSIS — R590 Localized enlarged lymph nodes: Secondary | ICD-10-CM | POA: Diagnosis not present

## 2023-03-31 DIAGNOSIS — I700 Atherosclerosis of aorta: Secondary | ICD-10-CM | POA: Diagnosis not present

## 2023-03-31 DIAGNOSIS — I2510 Atherosclerotic heart disease of native coronary artery without angina pectoris: Secondary | ICD-10-CM | POA: Diagnosis not present

## 2023-03-31 MED ORDER — IOPAMIDOL (ISOVUE-300) INJECTION 61%
500.0000 mL | Freq: Once | INTRAVENOUS | Status: AC | PRN
  Administered 2023-03-31: 75 mL via INTRAVENOUS

## 2023-04-22 DIAGNOSIS — C3490 Malignant neoplasm of unspecified part of unspecified bronchus or lung: Secondary | ICD-10-CM | POA: Diagnosis not present

## 2023-04-22 DIAGNOSIS — E1169 Type 2 diabetes mellitus with other specified complication: Secondary | ICD-10-CM | POA: Diagnosis not present

## 2023-04-22 DIAGNOSIS — Z720 Tobacco use: Secondary | ICD-10-CM | POA: Diagnosis not present

## 2023-04-22 DIAGNOSIS — J441 Chronic obstructive pulmonary disease with (acute) exacerbation: Secondary | ICD-10-CM | POA: Diagnosis not present

## 2023-04-22 DIAGNOSIS — I10 Essential (primary) hypertension: Secondary | ICD-10-CM | POA: Diagnosis not present

## 2023-05-03 ENCOUNTER — Other Ambulatory Visit: Payer: Self-pay

## 2023-05-03 DIAGNOSIS — R918 Other nonspecific abnormal finding of lung field: Secondary | ICD-10-CM

## 2023-05-05 ENCOUNTER — Inpatient Hospital Stay: Payer: BC Managed Care – PPO | Admitting: Internal Medicine

## 2023-05-05 ENCOUNTER — Inpatient Hospital Stay: Payer: BC Managed Care – PPO | Attending: Internal Medicine

## 2023-05-05 ENCOUNTER — Telehealth: Payer: Self-pay | Admitting: Emergency Medicine

## 2023-05-05 ENCOUNTER — Encounter: Payer: Self-pay | Admitting: *Deleted

## 2023-05-05 VITALS — BP 148/76 | HR 65 | Temp 97.8°F | Resp 13 | Wt 115.7 lb

## 2023-05-05 DIAGNOSIS — C3490 Malignant neoplasm of unspecified part of unspecified bronchus or lung: Secondary | ICD-10-CM

## 2023-05-05 DIAGNOSIS — F17210 Nicotine dependence, cigarettes, uncomplicated: Secondary | ICD-10-CM | POA: Insufficient documentation

## 2023-05-05 DIAGNOSIS — R634 Abnormal weight loss: Secondary | ICD-10-CM | POA: Diagnosis not present

## 2023-05-05 DIAGNOSIS — R918 Other nonspecific abnormal finding of lung field: Secondary | ICD-10-CM | POA: Insufficient documentation

## 2023-05-05 LAB — CBC WITH DIFFERENTIAL (CANCER CENTER ONLY)
Abs Immature Granulocytes: 0.01 10*3/uL (ref 0.00–0.07)
Basophils Absolute: 0 10*3/uL (ref 0.0–0.1)
Basophils Relative: 1 %
Eosinophils Absolute: 0.1 10*3/uL (ref 0.0–0.5)
Eosinophils Relative: 2 %
HCT: 39.3 % (ref 36.0–46.0)
Hemoglobin: 13.2 g/dL (ref 12.0–15.0)
Immature Granulocytes: 0 %
Lymphocytes Relative: 42 %
Lymphs Abs: 2.5 10*3/uL (ref 0.7–4.0)
MCH: 30.7 pg (ref 26.0–34.0)
MCHC: 33.6 g/dL (ref 30.0–36.0)
MCV: 91.4 fL (ref 80.0–100.0)
Monocytes Absolute: 0.5 10*3/uL (ref 0.1–1.0)
Monocytes Relative: 8 %
Neutro Abs: 2.8 10*3/uL (ref 1.7–7.7)
Neutrophils Relative %: 47 %
Platelet Count: 386 10*3/uL (ref 150–400)
RBC: 4.3 MIL/uL (ref 3.87–5.11)
RDW: 13.5 % (ref 11.5–15.5)
WBC Count: 5.9 10*3/uL (ref 4.0–10.5)
nRBC: 0 % (ref 0.0–0.2)

## 2023-05-05 LAB — CMP (CANCER CENTER ONLY)
ALT: 19 U/L (ref 0–44)
AST: 22 U/L (ref 15–41)
Albumin: 3.9 g/dL (ref 3.5–5.0)
Alkaline Phosphatase: 68 U/L (ref 38–126)
Anion gap: 9 (ref 5–15)
BUN: 13 mg/dL (ref 8–23)
CO2: 26 mmol/L (ref 22–32)
Calcium: 9.6 mg/dL (ref 8.9–10.3)
Chloride: 102 mmol/L (ref 98–111)
Creatinine: 0.75 mg/dL (ref 0.44–1.00)
GFR, Estimated: >60 mL/min (ref >60)
Glucose, Bld: 92 mg/dL (ref 70–99)
Potassium: 4.2 mmol/L (ref 3.5–5.1)
Sodium: 137 mmol/L (ref 135–145)
Total Bilirubin: 0.3 mg/dL (ref 0.3–1.2)
Total Protein: 8.6 g/dL — ABNORMAL HIGH (ref 6.5–8.1)

## 2023-05-05 NOTE — Telephone Encounter (Signed)
-----   Message from Nurse Aundra Millet B sent at 05/05/2023  3:16 PM EDT ----- Regarding: Biopsy needed Hi Dr Delton Coombes  We saw this pt today. Large RLL mass, 2 nodules in RUL, and LAD in R side of chest. Is there a way to expedite a biopsy for her?  Thanks,  [employer]

## 2023-05-05 NOTE — Progress Notes (Signed)
[facility] Telephone:([phone]   Fax:([phone]) [phone]  CONSULT NOTE  REFERRING PHYSICIAN: [name]  REASON FOR CONSULTATION:  82 years old African-American female with highly suspicious lung cancer  HPI MARGEART VARNELL is a 82 y.o. female presented to the clinic today for initial evaluation accompanied by her daughter and granddaughter for suspicious lung cancer.Discussed the use of AI scribe software for clinical note transcription with the patient, who gave verbal consent to proceed.  History of Present Illness   The patient, an 82 year old individual, presented with a recent history of respiratory distress. 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 The patient underwent chest X-rays, which revealed a concerning mass in the right lung. A subsequent CT scan confirmed the presence of a 6.8 by 5.8 cm mass in the right lower lobe, two additional nodules in the right upper lobe, and enlarged lymph nodes in the right hilar, subcarinal, and paratracheal regions.  The patient reported a persistent cough, which she described as mostly dry. She denied experiencing shortness of breath unless engaged in strenuous activities such as uphill walking. She also denied any chest pain or hemoptysis. The patient reported a weight loss of approximately 10 pounds over the past three to four months. She denied any other systemic symptoms such as nausea, vomiting, or diarrhea.  The patient has a past medical history of asthma and seasonal allergies. She has undergone multiple orthopedic surgeries, including total hip and knee replacements and rotator cuff repair. She denied any history of diabetes, hypertension, heart attacks, or strokes. The patient has a long history of smoking, which she attempted to quit recently but resumed due to personal stressors. She  denied any alcohol or illicit drug use.  The patient's family history is significant for heart disease in the father and a possible history of cancer in the mother, the type of which is unknown. 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 She reported no known allergies.      HPI  Past Medical History:  Diagnosis Date   Asthma    Diabetes mellitus    Hypertension    Seasonal allergies     Past Surgical History:  Procedure Laterality Date   ABDOMINAL HYSTERECTOMY     APPENDECTOMY     BACK SURGERY     JOINT REPLACEMENT     TOTAL HIP ARTHROPLASTY     TOTAL KNEE ARTHROPLASTY      No family history on file.  Social History Social History   Tobacco Use   Smoking status: Every Day    Current packs/day: 0.50    Average packs/day: 0.5 packs/day for 50.0 years (25.0 ttl pk-yrs)    Types: Cigarettes   Smokeless tobacco: Never  Vaping Use   Vaping status: Never Used  Substance Use Topics   Alcohol use: No   Drug use: No    Allergies  Allergen Reactions   Erythromycin Hives    Current Outpatient Medications  Medication Sig Dispense Refill   albuterol (VENTOLIN HFA) 108 (90 Base) MCG/ACT inhaler Inhale 2 puffs into the lungs every 6 (six) hours as needed for wheezing or shortness of breath. 8 g 2   Fluticasone-Umeclidin-Vilant 100-62.5-25 MCG/INH AEPB Inhale 2 application into the lungs.     furosemide (LASIX) 20 MG tablet Take 1-2 tablets in the morning as needed for your foot  swelling. 20 tablet 0   montelukast (SINGULAIR) 10 MG tablet Take 10 mg by mouth daily. For seasonal allergies     predniSONE (DELTASONE) 50 MG tablet Take one tablet by mouth for 5 days. 5 tablet 0   rosuvastatin (CRESTOR) 10 MG tablet Take 10 mg by mouth daily.       tiotropium (SPIRIVA) 18 MCG inhalation capsule Place 18 mcg into inhaler and inhale daily.     No current facility-administered medications for this visit.    Review of Systems  Constitutional:  positive for fatigue Eyes: negative Ears, nose, mouth, throat, and face: negative Respiratory: positive for cough and dyspnea on exertion Cardiovascular: negative Gastrointestinal: negative Genitourinary:negative Integument/breast: negative Hematologic/lymphatic: negative Musculoskeletal:negative Neurological: negative Behavioral/Psych: negative Endocrine: negative Allergic/Immunologic: negative  Physical Exam  NWG:NFAOZ, healthy, no distress, well nourished, and well developed SKIN: skin color, texture, turgor are normal, no rashes or significant lesions HEAD: Normocephalic, No masses, lesions, tenderness or abnormalities EYES: normal, PERRLA, Conjunctiva are pink and non-injected EARS: External ears normal, Canals clear OROPHARYNX:no exudate, no erythema, and lips, buccal mucosa, and tongue normal  NECK: supple, no adenopathy, no JVD LYMPH:  no palpable lymphadenopathy, no hepatosplenomegaly BREAST:not examined LUNGS: clear to auscultation , and palpation HEART: regular rate & rhythm, no murmurs, and no gallops ABDOMEN:abdomen soft, non-tender, normal bowel sounds, and no masses or organomegaly BACK: Back symmetric, no curvature., No CVA tenderness EXTREMITIES:no joint deformities, effusion, or inflammation, no edema  NEURO: alert & oriented x 3 with fluent speech, no focal motor/sensory deficits  PERFORMANCE STATUS: ECOG 1  LABORATORY DATA: Lab Results  Component Value Date   WBC 5.2 05/10/2011   HGB 8.1 (L) 05/17/2011   HCT 23.7 (L) 05/17/2011   MCV 92.1 05/10/2011   PLT 287 05/10/2011      Chemistry      Component Value Date/Time   NA 138 05/10/2011 1000   K 4.2 05/10/2011 1000   CL 103 05/10/2011 1000   CO2 24 05/10/2011 1000   BUN 12 05/10/2011 1000   CREATININE 0.63 05/10/2011 1000      Component Value Date/Time   CALCIUM 10.1 05/10/2011 1000   ALKPHOS 64 05/10/2011 1000   AST 18 05/10/2011 1000   ALT 22 05/10/2011 1000   BILITOT 0.2 (L) 05/10/2011  1000       RADIOGRAPHIC STUDIES: No results found.  ASSESSMENT: This is a very pleasant 82 years old African-American female with highly suspicious stage IIIb (T4, N2, M0) lung cancer pending tissue diagnosis as well as staging workup and presented with large right lower lobe lung mass in addition to other right upper lobe pulmonary nodule and right hilar and mediastinal lymphadenopathy in September 2024.  PLAN: I had a lengthy discussion with the patient and her daughter and granddaughter today about her current condition and treatment options. I personally and independently reviewed the scan images and discussed the results with them.  Suspected Lung Cancer Large mass in right lower lobe, two nodules in right upper lobe, and enlarged lymph nodes in right hilar, subcarinal, and paratracheal regions identified on CT scan. Patient is asymptomatic except for dry cough and unintentional weight loss of 10 pounds over 3-4 months. Patient is a long-term smoker. -Order PET scan and MRI of the brain to assess for potential metastasis. -Refer to pulmonologist for bronchoscopy and biopsy to confirm diagnosis and determine type of lung cancer. -Plan to discuss treatment options (radiation, chemotherapy, immunotherapy) once diagnosis is confirmed and staging is complete. -Follow-up in 2-3  weeks to review results and discuss next steps.  Tobacco Use Long-term smoker, recently resumed smoking due to stress but expresses intent to quit again. -Encourage smoking cessation and provide resources as needed.   The patient was advised to call immediately if she has any other concerning symptoms in the interval. The patient voices understanding of current disease status and treatment options and is in agreement with the current care plan.  All questions were answered. The patient knows to call the clinic with any problems, questions or concerns. We can certainly see the patient much sooner if necessary.  Thank  you so much for allowing me to participate in the care of TRECHELLE BEACHER. I will continue to follow up the patient with you and assist in her care.  The total time spent in the appointment was 60 minutes.  Disclaimer: This note was dictated with voice recognition software. Similar sounding words can inadvertently be transcribed and may not be corrected upon review.   Lajuana Matte May 05, 2023, 1:46 PM

## 2023-05-05 NOTE — Telephone Encounter (Signed)
Please schedule this patient w RB on 10/31. 30 minute visit, new lung mass consult

## 2023-05-06 NOTE — Progress Notes (Signed)
I met with the pt today at her med onc consult with Dr.mohamed. Pt was accompanied by her daughter, Lamar Laundry, and granddaughter, Leavy Cella. I introduced myself and explained the role of a NN.  The plan for the pt is to continue her oncology work up with imaging and a referral to Pulmonology for a biopsy. I let the pt know that I will schedule her imaging appts for her and reach out to Pulmonology to get her scheduled for a consult. After her imaging and PET scan, a follow up appt with Dr.Mohamed can be made to discuss the results and decide on a treatment plan.  MRI screening questions answered. No barriers.  Pt has good family support locally. No navigation barriers noted at this time.  I provided my business card with my direct number to the pt and Sonya. At the time of pts departure, pt and family denied questions.   4:06pm: I spoke to [place] at central scheduling who offered a PET scan on 11/7 at 6:30am or one on 11/11. I called the pt to see if 630 would be too early for her to get a PET scan done and pt stated that early is fine as she gets up early every day for work. I verbally provided the pt and Sonya the instructions for the PET scan. I explained she can't have any food/water only after midnight and they need to arrive prior to the scan. Both the pt and Sonya verbalized understanding.  I reconnected with Joselyn Glassman and let him know the pt will take the PET appt on 11/7. An appt for a brain MRI was also scheduled for the pt on 11/1 at 5pm at [facility].

## 2023-05-11 NOTE — Telephone Encounter (Signed)
Scheduled, nothing further needed.

## 2023-05-12 ENCOUNTER — Encounter: Payer: Self-pay | Admitting: Emergency Medicine

## 2023-05-12 ENCOUNTER — Ambulatory Visit (INDEPENDENT_AMBULATORY_CARE_PROVIDER_SITE_OTHER): Payer: BC Managed Care – PPO | Admitting: Emergency Medicine

## 2023-05-12 VITALS — BP 128/62 | HR 86 | Temp 97.8°F | Ht 70.0 in | Wt 111.8 lb

## 2023-05-12 DIAGNOSIS — F17210 Nicotine dependence, cigarettes, uncomplicated: Secondary | ICD-10-CM | POA: Diagnosis not present

## 2023-05-12 DIAGNOSIS — R918 Other nonspecific abnormal finding of lung field: Secondary | ICD-10-CM

## 2023-05-12 DIAGNOSIS — R590 Localized enlarged lymph nodes: Secondary | ICD-10-CM | POA: Diagnosis not present

## 2023-05-12 DIAGNOSIS — J449 Chronic obstructive pulmonary disease, unspecified: Secondary | ICD-10-CM | POA: Diagnosis not present

## 2023-05-12 NOTE — Progress Notes (Signed)
Subjective:    Patient ID: Kristy Salazar, female    DOB: 01-30-1941, 82 y.o.   MRN: [id number]  HPI  The patient, an 82 year old with a history of tobacco use (50 pack years), COPD, asthma, diabetes, hypertension, and seasonal allergies, was referred for an abnormal CT scan of the chest. 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 The subsequent chest x-ray and CT scan revealed a 6.8 by 5.8 cm mass in the right lower lobe, two additional retinopril nodules, and subcarinal lymphadenopathy. The patient was seen by an oncologist, and the possibility of lung malignancy was discussed. The patient is now referred for bronchoscopy and biopsies to obtain a tissue diagnosis and plan the next steps in therapy.  The patient's CT scan showed severe central lobular emphysema, a large speculated 6.8 by 5.8 cm posterior right lower lobe mass, a 2.1 by 1.2 cm peripheral right upper lobe nodule, a subplural intermedial right upper lobe nodule measuring 1.3 by 1.3 cm, and a 5 mm peripheral left lower lobe nodule. The patient also has right infrahylar, hylar, subcarinal, and paratracheal lymphadenopathy.  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 Since then, the chemicals have been changed, and the patient is no longer exposed to them. The patient is currently on Trelegy and Spiriva inhalers for COPD and asthma, and has not been on prednisone regularly. The patient has been able to quit smoking for several weeks in the past but has recently resumed due to stress related to her son's illness. The patient has a family history of cancer, but it is unclear if it was lung cancer.   RADIOLOGY Chest CT: 6.8 x 5.8 cm posterior right lower lobe mass, 2.1 x 1.2 cm peripheral right upper lobe nodule, 1.3 x 1.3  cm subpleural intermedial right upper lobe nodule, 5 mm peripheral left lower lobe nodule, right infrahylar, hilar, subcarinal, and paratracheal lymphadenopathy, severe centrilobular emphysema (03/31/2023)  Review of Systems As per HPI  Past Medical History:  Diagnosis Date   Asthma    Diabetes mellitus    Hypertension    Seasonal allergies      Family history: Her aunt > Lung cancer  Social History   Socioeconomic History   Marital status: Legally Separated    Spouse name: Not on file   Number of children: Not on file   Years of education: Not on file   Highest education level: Not on file  Occupational History   Not on file  Tobacco Use   Smoking status: Every Day    Current packs/day: 0.50    Average packs/day: 0.5 packs/day for 50.0 years (25.0 ttl pk-yrs)    Types: Cigarettes   Smokeless tobacco: Never  Vaping Use   Vaping status: Never Used  Substance and Sexual Activity   Alcohol use: No   Drug use: No   Sexual activity: Not on file  Other Topics Concern   Not on file  Social History Narrative   Not on file   Social Determinants of Health   Financial Resource Strain: Not on file  Food Insecurity: Not on file  Transportation Needs: Not on file  Physical Activity: Not on file  Stress: Not on file  Social Connections: Not on file  Intimate Partner Violence: Not on file    -  Subjective:    Patient ID: Kristy Salazar, female    DOB: 01-30-1941, 82 y.o.   MRN: [id number]  HPI  The patient, an 82 year old with a history of tobacco use (50 pack years), COPD, asthma, diabetes, hypertension, and seasonal allergies, was referred for an abnormal CT scan of the chest. 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 The subsequent chest x-ray and CT scan revealed a 6.8 by 5.8 cm mass in the right lower lobe, two additional retinopril nodules, and subcarinal lymphadenopathy. The patient was seen by an oncologist, and the possibility of lung malignancy was discussed. The patient is now referred for bronchoscopy and biopsies to obtain a tissue diagnosis and plan the next steps in therapy.  The patient's CT scan showed severe central lobular emphysema, a large speculated 6.8 by 5.8 cm posterior right lower lobe mass, a 2.1 by 1.2 cm peripheral right upper lobe nodule, a subplural intermedial right upper lobe nodule measuring 1.3 by 1.3 cm, and a 5 mm peripheral left lower lobe nodule. The patient also has right infrahylar, hylar, subcarinal, and paratracheal lymphadenopathy.  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 Since then, the chemicals have been changed, and the patient is no longer exposed to them. The patient is currently on Trelegy and Spiriva inhalers for COPD and asthma, and has not been on prednisone regularly. The patient has been able to quit smoking for several weeks in the past but has recently resumed due to stress related to her son's illness. The patient has a family history of cancer, but it is unclear if it was lung cancer.   RADIOLOGY Chest CT: 6.8 x 5.8 cm posterior right lower lobe mass, 2.1 x 1.2 cm peripheral right upper lobe nodule, 1.3 x 1.3  cm subpleural intermedial right upper lobe nodule, 5 mm peripheral left lower lobe nodule, right infrahylar, hilar, subcarinal, and paratracheal lymphadenopathy, severe centrilobular emphysema (03/31/2023)  Review of Systems As per HPI  Past Medical History:  Diagnosis Date   Asthma    Diabetes mellitus    Hypertension    Seasonal allergies      Family history: Her aunt > Lung cancer  Social History   Socioeconomic History   Marital status: Legally Separated    Spouse name: Not on file   Number of children: Not on file   Years of education: Not on file   Highest education level: Not on file  Occupational History   Not on file  Tobacco Use   Smoking status: Every Day    Current packs/day: 0.50    Average packs/day: 0.5 packs/day for 50.0 years (25.0 ttl pk-yrs)    Types: Cigarettes   Smokeless tobacco: Never  Vaping Use   Vaping status: Never Used  Substance and Sexual Activity   Alcohol use: No   Drug use: No   Sexual activity: Not on file  Other Topics Concern   Not on file  Social History Narrative   Not on file   Social Determinants of Health   Financial Resource Strain: Not on file  Food Insecurity: Not on file  Transportation Needs: Not on file  Physical Activity: Not on file  Stress: Not on file  Social Connections: Not on file  Intimate Partner Violence: Not on file    -

## 2023-05-12 NOTE — H&P (View-Only) (Signed)
Subjective:    Patient ID: Kristy Salazar, female    DOB: 01-30-1941, 82 y.o.   MRN: [id number]  HPI  The patient, an 82 year old with a history of tobacco use (50 pack years), COPD, asthma, diabetes, hypertension, and seasonal allergies, was referred for an abnormal CT scan of the chest. 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 The subsequent chest x-ray and CT scan revealed a 6.8 by 5.8 cm mass in the right lower lobe, two additional retinopril nodules, and subcarinal lymphadenopathy. The patient was seen by an oncologist, and the possibility of lung malignancy was discussed. The patient is now referred for bronchoscopy and biopsies to obtain a tissue diagnosis and plan the next steps in therapy.  The patient's CT scan showed severe central lobular emphysema, a large speculated 6.8 by 5.8 cm posterior right lower lobe mass, a 2.1 by 1.2 cm peripheral right upper lobe nodule, a subplural intermedial right upper lobe nodule measuring 1.3 by 1.3 cm, and a 5 mm peripheral left lower lobe nodule. The patient also has right infrahylar, hylar, subcarinal, and paratracheal lymphadenopathy.  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 Since then, the chemicals have been changed, and the patient is no longer exposed to them. The patient is currently on Trelegy and Spiriva inhalers for COPD and asthma, and has not been on prednisone regularly. The patient has been able to quit smoking for several weeks in the past but has recently resumed due to stress related to her son's illness. The patient has a family history of cancer, but it is unclear if it was lung cancer.   RADIOLOGY Chest CT: 6.8 x 5.8 cm posterior right lower lobe mass, 2.1 x 1.2 cm peripheral right upper lobe nodule, 1.3 x 1.3  cm subpleural intermedial right upper lobe nodule, 5 mm peripheral left lower lobe nodule, right infrahylar, hilar, subcarinal, and paratracheal lymphadenopathy, severe centrilobular emphysema (03/31/2023)  Review of Systems As per HPI  Past Medical History:  Diagnosis Date   Asthma    Diabetes mellitus    Hypertension    Seasonal allergies      Family history: Her aunt > Lung cancer  Social History   Socioeconomic History   Marital status: Legally Separated    Spouse name: Not on file   Number of children: Not on file   Years of education: Not on file   Highest education level: Not on file  Occupational History   Not on file  Tobacco Use   Smoking status: Every Day    Current packs/day: 0.50    Average packs/day: 0.5 packs/day for 50.0 years (25.0 ttl pk-yrs)    Types: Cigarettes   Smokeless tobacco: Never  Vaping Use   Vaping status: Never Used  Substance and Sexual Activity   Alcohol use: No   Drug use: No   Sexual activity: Not on file  Other Topics Concern   Not on file  Social History Narrative   Not on file   Social Determinants of Health   Financial Resource Strain: Not on file  Food Insecurity: Not on file  Transportation Needs: Not on file  Physical Activity: Not on file  Stress: Not on file  Social Connections: Not on file  Intimate Partner Violence: Not on file    -  Subjective:    Patient ID: Kristy Salazar, female    DOB: 01-30-1941, 82 y.o.   MRN: [id number]  HPI  The patient, an 82 year old with a history of tobacco use (50 pack years), COPD, asthma, diabetes, hypertension, and seasonal allergies, was referred for an abnormal CT scan of the chest. 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 The subsequent chest x-ray and CT scan revealed a 6.8 by 5.8 cm mass in the right lower lobe, two additional retinopril nodules, and subcarinal lymphadenopathy. The patient was seen by an oncologist, and the possibility of lung malignancy was discussed. The patient is now referred for bronchoscopy and biopsies to obtain a tissue diagnosis and plan the next steps in therapy.  The patient's CT scan showed severe central lobular emphysema, a large speculated 6.8 by 5.8 cm posterior right lower lobe mass, a 2.1 by 1.2 cm peripheral right upper lobe nodule, a subplural intermedial right upper lobe nodule measuring 1.3 by 1.3 cm, and a 5 mm peripheral left lower lobe nodule. The patient also has right infrahylar, hylar, subcarinal, and paratracheal lymphadenopathy.  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 Since then, the chemicals have been changed, and the patient is no longer exposed to them. The patient is currently on Trelegy and Spiriva inhalers for COPD and asthma, and has not been on prednisone regularly. The patient has been able to quit smoking for several weeks in the past but has recently resumed due to stress related to her son's illness. The patient has a family history of cancer, but it is unclear if it was lung cancer.   RADIOLOGY Chest CT: 6.8 x 5.8 cm posterior right lower lobe mass, 2.1 x 1.2 cm peripheral right upper lobe nodule, 1.3 x 1.3  cm subpleural intermedial right upper lobe nodule, 5 mm peripheral left lower lobe nodule, right infrahylar, hilar, subcarinal, and paratracheal lymphadenopathy, severe centrilobular emphysema (03/31/2023)  Review of Systems As per HPI  Past Medical History:  Diagnosis Date   Asthma    Diabetes mellitus    Hypertension    Seasonal allergies      Family history: Her aunt > Lung cancer  Social History   Socioeconomic History   Marital status: Legally Separated    Spouse name: Not on file   Number of children: Not on file   Years of education: Not on file   Highest education level: Not on file  Occupational History   Not on file  Tobacco Use   Smoking status: Every Day    Current packs/day: 0.50    Average packs/day: 0.5 packs/day for 50.0 years (25.0 ttl pk-yrs)    Types: Cigarettes   Smokeless tobacco: Never  Vaping Use   Vaping status: Never Used  Substance and Sexual Activity   Alcohol use: No   Drug use: No   Sexual activity: Not on file  Other Topics Concern   Not on file  Social History Narrative   Not on file   Social Determinants of Health   Financial Resource Strain: Not on file  Food Insecurity: Not on file  Transportation Needs: Not on file  Physical Activity: Not on file  Stress: Not on file  Social Connections: Not on file  Intimate Partner Violence: Not on file    -

## 2023-05-12 NOTE — Assessment & Plan Note (Signed)
Suspected Lung Cancer Referred for abnormal CT scan showing a 6.8 by 5.8 cm mass in the right lower lobe, two additional nodules, and subcarinal lymphadenopathy. Discussed the high likelihood of lung cancer given the imaging findings and her history of tobacco use. -Schedule bronchoscopy with biopsies on 05/17/2023 to obtain tissue diagnosis.

## 2023-05-12 NOTE — Patient Instructions (Signed)
VISIT SUMMARY:  Today, we discussed the findings from your recent CT scan, which showed a mass in your right lung and additional nodules. Given your history of tobacco use and the imaging results, there is a high likelihood of lung cancer. We also reviewed your current management for COPD, asthma, diabetes, hypertension, and seasonal allergies.  YOUR PLAN:  -SUSPECTED LUNG CANCER: Your CT scan showed a mass in your right lung and additional nodules, which are highly suggestive of lung cancer, especially given your history of smoking. We have scheduled a bronchoscopy with biopsies on 05/17/2023 to obtain a tissue diagnosis and plan the next steps in your treatment.  -COPD/ASTHMA: Chronic Obstructive Pulmonary Disease (COPD) and asthma are conditions that make it hard to breathe. Your condition is stable with your current inhalers, Trelegy and Spiriva. Please continue using these inhalers as prescribed.  -TOBACCO USE: You have recently started smoking again due to stress. We will address smoking cessation in future visits to help you quit again.   INSTRUCTIONS:  Please follow up for your bronchoscopy with biopsies on 05/17/2023. Continue with your current medications for COPD, asthma, diabetes, hypertension, and seasonal allergies as prescribed by your doctors. We will discuss smoking cessation strategies in future visits.

## 2023-05-12 NOTE — Assessment & Plan Note (Signed)
Tobacco Use Recent relapse due to stress. Previously successful in quitting. -Address smoking cessation in future visits.

## 2023-05-12 NOTE — Assessment & Plan Note (Signed)
COPD/Asthma Stable on Trelegy and Spiriva inhalers. No recent exacerbations requiring prednisone or frequent use of albuterol. -Continue current inhaler regimen.

## 2023-05-13 ENCOUNTER — Ambulatory Visit (HOSPITAL_COMMUNITY)
Admission: RE | Admit: 2023-05-13 | Discharge: 2023-05-13 | Disposition: A | Discharge status: Home / Self Care | Payer: BC Managed Care – PPO | Source: Ambulatory Visit | Attending: Internal Medicine | Admitting: Internal Medicine

## 2023-05-13 DIAGNOSIS — C3490 Malignant neoplasm of unspecified part of unspecified bronchus or lung: Secondary | ICD-10-CM | POA: Diagnosis not present

## 2023-05-13 MED ORDER — GADOBUTROL 1 MMOL/ML IV SOLN
5.0000 mL | Freq: Once | INTRAVENOUS | Status: AC | PRN
  Administered 2023-05-13: 5 mL via INTRAVENOUS

## 2023-05-16 ENCOUNTER — Other Ambulatory Visit: Payer: Self-pay

## 2023-05-16 ENCOUNTER — Other Ambulatory Visit (HOSPITAL_COMMUNITY): Payer: BC Managed Care – PPO

## 2023-05-16 ENCOUNTER — Encounter (HOSPITAL_COMMUNITY): Payer: Self-pay | Admitting: Emergency Medicine

## 2023-05-16 NOTE — Progress Notes (Signed)
Ms Kristy Salazar denies chest pain or shortness of breath. Patient denies having any s/s of Covid in her household, also denies any known exposure to Covid. Ms Kristy Salazar  any s/s of upper or lower respiratory infection in the past 8 weeks.  [name]' s PCP is Dr. Lowella Bandy.

## 2023-05-17 ENCOUNTER — Ambulatory Visit (HOSPITAL_COMMUNITY): Payer: BC Managed Care – PPO

## 2023-05-17 ENCOUNTER — Encounter (HOSPITAL_COMMUNITY): Payer: Self-pay | Admitting: Emergency Medicine

## 2023-05-17 ENCOUNTER — Ambulatory Visit (HOSPITAL_COMMUNITY): Payer: BC Managed Care – PPO | Admitting: Registered Nurse

## 2023-05-17 ENCOUNTER — Encounter (HOSPITAL_COMMUNITY)
Admission: RE | Disposition: A | Discharge status: Home / Self Care | Payer: Self-pay | Source: Home / Self Care | Attending: Emergency Medicine

## 2023-05-17 ENCOUNTER — Ambulatory Visit (HOSPITAL_COMMUNITY)
Admission: RE | Admit: 2023-05-17 | Discharge: 2023-05-17 | Disposition: A | Discharge status: Home / Self Care | Payer: BC Managed Care – PPO | Attending: Emergency Medicine | Admitting: Emergency Medicine

## 2023-05-17 DIAGNOSIS — F17210 Nicotine dependence, cigarettes, uncomplicated: Secondary | ICD-10-CM | POA: Diagnosis not present

## 2023-05-17 DIAGNOSIS — J432 Centrilobular emphysema: Secondary | ICD-10-CM | POA: Diagnosis not present

## 2023-05-17 DIAGNOSIS — R911 Solitary pulmonary nodule: Secondary | ICD-10-CM | POA: Diagnosis present

## 2023-05-17 DIAGNOSIS — Z48813 Encounter for surgical aftercare following surgery on the respiratory system: Secondary | ICD-10-CM | POA: Diagnosis not present

## 2023-05-17 DIAGNOSIS — E119 Type 2 diabetes mellitus without complications: Secondary | ICD-10-CM | POA: Diagnosis not present

## 2023-05-17 DIAGNOSIS — R918 Other nonspecific abnormal finding of lung field: Secondary | ICD-10-CM

## 2023-05-17 DIAGNOSIS — C771 Secondary and unspecified malignant neoplasm of intrathoracic lymph nodes: Secondary | ICD-10-CM | POA: Insufficient documentation

## 2023-05-17 DIAGNOSIS — I10 Essential (primary) hypertension: Secondary | ICD-10-CM

## 2023-05-17 DIAGNOSIS — C3432 Malignant neoplasm of lower lobe, left bronchus or lung: Secondary | ICD-10-CM | POA: Insufficient documentation

## 2023-05-17 DIAGNOSIS — C3490 Malignant neoplasm of unspecified part of unspecified bronchus or lung: Secondary | ICD-10-CM | POA: Diagnosis not present

## 2023-05-17 DIAGNOSIS — R9389 Abnormal findings on diagnostic imaging of other specified body structures: Secondary | ICD-10-CM | POA: Diagnosis not present

## 2023-05-17 DIAGNOSIS — R590 Localized enlarged lymph nodes: Secondary | ICD-10-CM | POA: Diagnosis not present

## 2023-05-17 DIAGNOSIS — J9811 Atelectasis: Secondary | ICD-10-CM | POA: Diagnosis not present

## 2023-05-17 HISTORY — DX: Chronic obstructive pulmonary disease, unspecified: J44.9

## 2023-05-17 HISTORY — DX: Unspecified osteoarthritis, unspecified site: M19.90

## 2023-05-17 HISTORY — PX: VIDEO BRONCHOSCOPY WITH ENDOBRONCHIAL ULTRASOUND: SHX6177

## 2023-05-17 HISTORY — PX: BRONCHIAL NEEDLE ASPIRATION BIOPSY: SHX5106

## 2023-05-17 HISTORY — PX: BRONCHIAL BRUSHINGS: SHX5108

## 2023-05-17 HISTORY — PX: BRONCHIAL BIOPSY: SHX5109

## 2023-05-17 SURGERY — BRONCHOSCOPY, WITH BIOPSY USING ELECTROMAGNETIC NAVIGATION
Anesthesia: General | Laterality: Right

## 2023-05-17 MED ORDER — PHENYLEPHRINE HCL (PRESSORS) 10 MG/ML IV SOLN
INTRAVENOUS | Status: DC | PRN
  Administered 2023-05-17 (×2): 160 ug via INTRAVENOUS
  Administered 2023-05-17: 80 ug via INTRAVENOUS
  Administered 2023-05-17: 160 ug via INTRAVENOUS
  Administered 2023-05-17: 80 ug via INTRAVENOUS

## 2023-05-17 MED ORDER — LIDOCAINE 2% (20 MG/ML) 5 ML SYRINGE
INTRAMUSCULAR | Status: DC | PRN
  Administered 2023-05-17: 100 mg via INTRAVENOUS

## 2023-05-17 MED ORDER — PHENYLEPHRINE HCL-NACL 20-0.9 MG/250ML-% IV SOLN
INTRAVENOUS | Status: DC | PRN
  Administered 2023-05-17: 30 ug/min via INTRAVENOUS

## 2023-05-17 MED ORDER — ROCURONIUM BROMIDE 10 MG/ML (PF) SYRINGE
PREFILLED_SYRINGE | INTRAVENOUS | Status: DC | PRN
  Administered 2023-05-17: 40 mg via INTRAVENOUS

## 2023-05-17 MED ORDER — DEXAMETHASONE SODIUM PHOSPHATE 10 MG/ML IJ SOLN
INTRAMUSCULAR | Status: DC | PRN
  Administered 2023-05-17: 10 mg via INTRAVENOUS

## 2023-05-17 MED ORDER — CHLORHEXIDINE GLUCONATE 0.12 % MT SOLN
15.0000 mL | Freq: Once | OROMUCOSAL | Status: AC
  Administered 2023-05-17: 15 mL via OROMUCOSAL
  Filled 2023-05-17: qty 15

## 2023-05-17 MED ORDER — SODIUM CHLORIDE 0.9 % IV SOLN: INTRAVENOUS | Status: DC | PRN

## 2023-05-17 MED ORDER — PROPOFOL 10 MG/ML IV BOLUS
INTRAVENOUS | Status: DC | PRN
  Administered 2023-05-17: 100 mg via INTRAVENOUS

## 2023-05-17 MED ORDER — ONDANSETRON HCL 4 MG/2ML IJ SOLN
INTRAMUSCULAR | Status: DC | PRN
  Administered 2023-05-17: 4 mg via INTRAVENOUS

## 2023-05-17 MED ORDER — PROPOFOL 500 MG/50ML IV EMUL
INTRAVENOUS | Status: DC | PRN
  Administered 2023-05-17: 150 ug/kg/min via INTRAVENOUS

## 2023-05-17 NOTE — Anesthesia Procedure Notes (Addendum)
Procedure Name: Intubation Date/Time: 05/17/2023 12:35 PM  Performed by: Marquis Buggy, CRNAPre-anesthesia Checklist: Patient identified, Emergency Drugs available, Suction available, Patient being monitored and Timeout performed Patient Re-evaluated:Patient Re-evaluated prior to induction Oxygen Delivery Method: Circle system utilized Induction Type: IV induction Ventilation: Mask ventilation without difficulty Laryngoscope Size: Mac and 3 Grade View: Grade I Tube type: Oral Tube size: 8.5 mm Number of attempts: 1 Placement Confirmation: ETT inserted through vocal cords under direct vision, positive ETCO2, CO2 detector and breath sounds checked- equal and bilateral Secured at: 23 cm

## 2023-05-17 NOTE — Op Note (Signed)
Video Bronchoscopy with Robotic Assisted Bronchoscopic Navigation and Endobronchial Ultrasound Procedure Note  Date of Operation: 05/17/2023   Pre-op Diagnosis: Right lower lobe mass, right lower lobe nodule, mediastinal adenopathy  Post-op Diagnosis: Right lower lobe mass, mediastinal adenopathy (right lower lobe nodule had resolved)  Surgeon: Levy Pupa  Assistants: None  Anesthesia: General endotracheal anesthesia  Operation: Flexible video fiberoptic bronchoscopy with robotic assistance and biopsies.  Estimated Blood Loss: Minimal  Complications: None  Indications and History: Kristy Salazar is a 82 y.o. female with history of tobacco use, COPD, diabetes and hypertension.  She was evaluated for shortness of breath and found to have a large right lower lobe mass, right upper lobe nodule and mediastinal adenopathy on CT scan of the chest.  Recommendation made to achieve a tissue diagnosis via robotic assisted navigational bronchoscopy and endobronchial ultrasound with biopsies. The risks, benefits, complications, treatment options and expected outcomes were discussed with the patient.  The possibilities of pneumothorax, pneumonia, reaction to medication, pulmonary aspiration, perforation of a viscus, bleeding, failure to diagnose a condition and creating a complication requiring transfusion or operation were discussed with the patient who freely signed the consent.    Description of Procedure: The patient was seen in the Preoperative Area, was examined and was deemed appropriate to proceed.  The patient was taken to [facility] endoscopy room 3, identified as [name] and the procedure verified as Flexible Video Fiberoptic Bronchoscopy.  A Time Out was held and the above information confirmed.   Prior to the date of the procedure a high-resolution CT scan of the chest was performed. Utilizing ION software program a virtual tracheobronchial tree was generated to allow the creation of  distinct navigation pathways to the patient's parenchymal abnormalities. After being taken to the operating room general anesthesia was initiated and the patient  was orally intubated. The video fiberoptic bronchoscope was introduced via the endotracheal tube and a general inspection was performed which showed normal anatomy on the left.  There was narrowing of right lower lobe basilar segmental airway with irregularity consistent with an endobronchial lesion.  The other right sided airways were normal.  Aspiration of the bilateral mainstems was completed to remove any remaining secretions. Robotic catheter inserted into patient's endotracheal tube.   Target #1 right upper lobe nodule: The distinct navigation pathways prepared prior to this procedure were then utilized to navigate to patient's lesion identified on CT scan dated 03/31/2023. The robotic catheter was secured into place.  Cios three-dimensional imaging was performed to localize the right upper lobe nodule.  The nodule had resolved on Cios-spin, consistent with an inflammatory process.  No samples were taken from this region.  Target #2 right lower lobe mass: The distinct navigation pathways prepared prior to this procedure were then utilized to navigate to patient's lesion identified on CT scan.  An endobronchial lesion was noted in the airway on video inspection.  The robotic catheter was secured into place and the vision probe was withdrawn. Under fluoroscopic guidance transbronchial needle brushings, transbronchial needle biopsies, and transbronchial forceps biopsies were performed to be sent for cytology and pathology.  The robotic scope was then withdrawn and the endobronchial ultrasound was used to identify and characterize the peritracheal, hilar and bronchial lymph nodes. Inspection showed enlargement at station 7 and station 11R.  The primary right lower lobe mass could also be visualized on ultrasound. Using real-time ultrasound  guidance Wang needle biopsies were take from Station 7 and 11R nodes and were sent for cytology.  At the end of the procedure a general airway inspection was performed and there was no evidence of active bleeding. The bronchoscope was removed.  The patient tolerated the procedure well. There was no significant blood loss and there were no obvious complications. A post-procedural chest x-ray is pending.  Samples Target #1: None.  The right upper lobe nodule had resolved.  Samples Target #2: 1. Transbronchial needle brushings from right lower lobe mass 2. Transbronchial Wang needle biopsies from right lower lobe mass 3. Transbronchial forceps biopsies from right lower lobe mass  EBUS samples: 1. Wang needle biopsies from 7 node 2. Wang needle biopsies from 11R node  Plans:  The patient will be discharged from the PACU to home when recovered from anesthesia and after chest x-ray is reviewed. We will review the cytology, pathology and microbiology results with the patient when they become available. Outpatient followup will be with Dr Delton Coombes and Dr. Arbutus Ped.    Levy Pupa, MD, PhD 05/17/2023, 1:42 PM [facility] [phone] or if no answer before 7:00PM call [phone] For any issues after 7:00PM please call eLink [phone]

## 2023-05-17 NOTE — Discharge Instructions (Signed)
Flexible Bronchoscopy, Care After This sheet gives you information about how to care for yourself after your test. Your doctor may also give you more specific instructions. If you have problems or questions, contact your doctor. Follow these instructions at home: Eating and drinking When your numbness is gone and your cough and gag reflexes have come back, you may: Eat only soft foods. Slowly drink liquids. The day after the test, go back to your normal diet. Driving Do not drive for 24 hours if you were given a medicine to help you relax (sedative). Do not drive or use heavy machinery while taking prescription pain medicine. General instructions  Take over-the-counter and prescription medicines only as told by your doctor. Return to your normal activities as told. Ask what activities are safe for you. Do not use any products that have nicotine or tobacco in them. This includes cigarettes and e-cigarettes. If you need help quitting, ask your doctor. Keep all follow-up visits as told by your doctor. This is important. It is very important if you had a tissue sample (biopsy) taken. Get help right away if: You have shortness of breath that gets worse. You get light-headed. You feel like you are going to pass out (faint). You have chest pain. You cough up: More than a little blood. More blood than before. Summary Do not eat or drink anything (not even water) for 2 hours after your test, or until your numbing medicine wears off. Do not use cigarettes. Do not use e-cigarettes. Get help right away if you have chest pain.  Please call our office for any questions or concerns.  [phone].  This information is not intended to replace advice given to you by your health care provider. Make sure you discuss any questions you have with your health care provider. Document Released: 04/25/2009 Document Revised: 06/10/2017 Document Reviewed: 07/16/2016 Elsevier Patient Education  2020 [employer]  

## 2023-05-17 NOTE — Anesthesia Postprocedure Evaluation (Signed)
Anesthesia Post Note  Patient: Kristy Salazar  Procedure(s) Performed: ROBOTIC ASSISTED NAVIGATIONAL BRONCHOSCOPY (Right) VIDEO BRONCHOSCOPY WITH ENDOBRONCHIAL ULTRASOUND (Right) BRONCHIAL BRUSHINGS BRONCHIAL NEEDLE ASPIRATION BIOPSIES BRONCHIAL BIOPSIES     Patient location during evaluation: PACU Anesthesia Type: General Level of consciousness: awake and alert Pain management: pain level controlled Vital Signs Assessment: post-procedure vital signs reviewed and stable Respiratory status: spontaneous breathing, nonlabored ventilation, respiratory function stable and patient connected to nasal cannula oxygen Cardiovascular status: blood pressure returned to baseline and stable Postop Assessment: no apparent nausea or vomiting Anesthetic complications: no   No notable events documented.  Last Vitals:  Vitals:   05/17/23 1410 05/17/23 1420  BP: 92/60 (!) 102/56  Pulse: 78 75  Resp: (!) 22 (!) 22  Temp:    SpO2: 95% 98%    Last Pain:  Vitals:   05/17/23 1420  TempSrc:   PainSc: 0-No pain                 Trevor Iha

## 2023-05-17 NOTE — Interval H&P Note (Signed)
History and Physical Interval Note:  05/17/2023 11:06 AM  Kristy Salazar  has presented today for surgery, with the diagnosis of RIGHT LOWER LOBE MASS, RIGHT UPPER LOBE NODULE, MEDIALSTINIL ADENOPATHY.  The various methods of treatment have been discussed with the patient and family. After consideration of risks, benefits and other options for treatment, the patient has consented to  Procedure(s): ROBOTIC ASSISTED NAVIGATIONAL BRONCHOSCOPY (Right) VIDEO BRONCHOSCOPY WITH ENDOBRONCHIAL ULTRASOUND (Right) as a surgical intervention.  The patient's history has been reviewed, patient examined, no change in status, stable for surgery.  I have reviewed the patient's chart and labs.  Questions were answered to the patient's satisfaction.     [name]

## 2023-05-17 NOTE — Anesthesia Preprocedure Evaluation (Signed)
Anesthesia Evaluation  Patient identified by MRN, date of birth, ID band Patient awake    Reviewed: Allergy & Precautions, NPO status , Patient's Chart, lab work & pertinent test results  Airway Mallampati: II  TM Distance: >3 FB Neck ROM: Full    Dental   Pulmonary asthma , COPD, Current Smoker and Patient abstained from smoking.   breath sounds clear to auscultation       Cardiovascular hypertension, Pt. on medications  Rhythm:Regular Rate:Normal     Neuro/Psych negative neurological ROS     GI/Hepatic negative GI ROS, Neg liver ROS,,,  Endo/Other  diabetes    Renal/GU negative Renal ROS     Musculoskeletal  (+) Arthritis ,    Abdominal   Peds  Hematology negative hematology ROS (+)   Anesthesia Other Findings   Reproductive/Obstetrics                             Anesthesia Physical Anesthesia Plan  ASA: 3  Anesthesia Plan: General   Post-op Pain Management: Minimal or no pain anticipated   Induction: Intravenous  PONV Risk Score and Plan: 2 and Dexamethasone, Ondansetron and Treatment may vary due to age or medical condition  Airway Management Planned: Oral ETT  Additional Equipment: None  Intra-op Plan:   Post-operative Plan: Extubation in OR  Informed Consent: I have reviewed the patients History and Physical, chart, labs and discussed the procedure including the risks, benefits and alternatives for the proposed anesthesia with the patient or authorized representative who has indicated his/her understanding and acceptance.     Dental advisory given  Plan Discussed with: CRNA  Anesthesia Plan Comments:        Anesthesia Quick Evaluation

## 2023-05-17 NOTE — Transfer of Care (Signed)
Immediate Anesthesia Transfer of Care Note  Patient: Kristy Salazar  Procedure(s) Performed: ROBOTIC ASSISTED NAVIGATIONAL BRONCHOSCOPY (Right) VIDEO BRONCHOSCOPY WITH ENDOBRONCHIAL ULTRASOUND (Right) BRONCHIAL BRUSHINGS BRONCHIAL NEEDLE ASPIRATION BIOPSIES BRONCHIAL BIOPSIES  Patient Location: PACU  Anesthesia Type:General  Level of Consciousness: awake, alert , and oriented  Airway & Oxygen Therapy: Patient connected to nasal cannula oxygen  Post-op Assessment: Report given to RN and Post -op Vital signs reviewed and stable  Post vital signs: Reviewed and stable  Last Vitals:  Vitals Value Taken Time  BP 111/65 05/17/23 1346  Temp    Pulse 85 05/17/23 1352  Resp 24 05/17/23 1352  SpO2 96 % 05/17/23 1352  Vitals shown include unfiled device data.  Last Pain:  Vitals:   05/17/23 1343  TempSrc: Temporal  PainSc:          Complications: No notable events documented.

## 2023-05-18 ENCOUNTER — Encounter (HOSPITAL_COMMUNITY): Payer: Self-pay | Admitting: Emergency Medicine

## 2023-05-19 ENCOUNTER — Encounter (HOSPITAL_COMMUNITY)
Admission: RE | Admit: 2023-05-19 | Discharge: 2023-05-19 | Disposition: A | Discharge status: Home / Self Care | Payer: BC Managed Care – PPO | Source: Ambulatory Visit | Attending: Internal Medicine | Admitting: Internal Medicine

## 2023-05-19 DIAGNOSIS — C3490 Malignant neoplasm of unspecified part of unspecified bronchus or lung: Secondary | ICD-10-CM | POA: Diagnosis not present

## 2023-05-19 LAB — CYTOLOGY - NON PAP

## 2023-05-19 LAB — GLUCOSE, CAPILLARY: Glucose-Capillary: 112 mg/dL — ABNORMAL HIGH (ref 70–99)

## 2023-05-19 MED ORDER — FLUDEOXYGLUCOSE F - 18 (FDG) INJECTION
5.5000 | Freq: Once | INTRAVENOUS | Status: AC
Start: 2023-05-19 — End: 2023-05-19
  Administered 2023-05-19: 5.67 via INTRAVENOUS

## 2023-05-20 ENCOUNTER — Telehealth: Payer: Self-pay | Admitting: Emergency Medicine

## 2023-05-20 DIAGNOSIS — C3490 Malignant neoplasm of unspecified part of unspecified bronchus or lung: Secondary | ICD-10-CM | POA: Diagnosis not present

## 2023-05-20 DIAGNOSIS — M75100 Unspecified rotator cuff tear or rupture of unspecified shoulder, not specified as traumatic: Secondary | ICD-10-CM | POA: Diagnosis not present

## 2023-05-20 DIAGNOSIS — J441 Chronic obstructive pulmonary disease with (acute) exacerbation: Secondary | ICD-10-CM | POA: Diagnosis not present

## 2023-05-20 NOTE — Telephone Encounter (Signed)
Called pt to discuss bronchoscopy data. No answer, left VM. She has OV with Dr Arbutus Ped next week to talk about results and to plan treatment.   May be able to cancel OV at our office that same day.

## 2023-05-23 ENCOUNTER — Other Ambulatory Visit (HOSPITAL_COMMUNITY): Payer: BC Managed Care – PPO

## 2023-05-25 ENCOUNTER — Encounter: Payer: Self-pay | Admitting: Internal Medicine

## 2023-05-25 ENCOUNTER — Encounter: Payer: Self-pay | Admitting: Acute Care

## 2023-05-25 ENCOUNTER — Inpatient Hospital Stay: Payer: BC Managed Care – PPO | Attending: Internal Medicine | Admitting: Internal Medicine

## 2023-05-25 ENCOUNTER — Ambulatory Visit (INDEPENDENT_AMBULATORY_CARE_PROVIDER_SITE_OTHER): Payer: BC Managed Care – PPO | Admitting: Acute Care

## 2023-05-25 VITALS — BP 127/66 | HR 75 | Temp 98.1°F | Resp 17 | Ht 70.0 in | Wt 115.7 lb

## 2023-05-25 VITALS — BP 104/64 | HR 88 | Temp 97.9°F | Ht 69.5 in | Wt 116.8 lb

## 2023-05-25 DIAGNOSIS — Z9889 Other specified postprocedural states: Secondary | ICD-10-CM | POA: Diagnosis not present

## 2023-05-25 DIAGNOSIS — F17210 Nicotine dependence, cigarettes, uncomplicated: Secondary | ICD-10-CM

## 2023-05-25 DIAGNOSIS — C3432 Malignant neoplasm of lower lobe, left bronchus or lung: Secondary | ICD-10-CM | POA: Diagnosis not present

## 2023-05-25 DIAGNOSIS — Z5111 Encounter for antineoplastic chemotherapy: Secondary | ICD-10-CM | POA: Insufficient documentation

## 2023-05-25 DIAGNOSIS — C3431 Malignant neoplasm of lower lobe, right bronchus or lung: Secondary | ICD-10-CM | POA: Insufficient documentation

## 2023-05-25 DIAGNOSIS — R590 Localized enlarged lymph nodes: Secondary | ICD-10-CM | POA: Diagnosis not present

## 2023-05-25 MED ORDER — LIDOCAINE-PRILOCAINE 2.5-2.5 % EX CREA: TOPICAL_CREAM | CUTANEOUS | 3 refills | Status: DC

## 2023-05-25 MED ORDER — PROCHLORPERAZINE MALEATE 10 MG PO TABS: 10.0000 mg | ORAL_TABLET | Freq: Four times a day (QID) | ORAL | 1 refills | Status: DC | PRN

## 2023-05-25 NOTE — Progress Notes (Signed)
Thoracic Location of Tumor / Histology: Stage IIIb non small cell lung cancer Right Lower Lobe    Biopsies of Right Lower Lobe Lung 05/17/2023    Past/Anticipated interventions by cardiothoracic surgery, if any:  Not a good surgical candidate.  Past/Anticipated interventions by medical oncology, if any:  Dr. Arbutus Ped -Concurrent Chemoradiation- start 06/06/2023.   Tobacco/Marijuana/Snuff/ETOH use: Current Smoker, working on quitting.  Signs/Symptoms Weight changes, if any: She reports  couple pounds weight loss over the past month. Respiratory complaints, if any: She reports some SOB when climbing hills.  This is her baseline. Hemoptysis, if any: She reports a little cough with clear/yellowish phlegm.  Denies hemoptysis. Pain issues, if any:  Denies chest pain, pressure or tightness.  SAFETY ISSUES: Prior radiation? No Pacemaker/ICD?  No Possible current pregnancy? Hysterectomy Is the patient on methotrexate? No  Current Complaints / other details:   -Port Insertion 06/03/2023

## 2023-05-25 NOTE — Progress Notes (Signed)
History of Present Illness Kristy Salazar is a 82 y.o. female former smoker with newly diagnosed Stage IIIb (T4, N2, M0) non-small cell lung cancer, squamous cell carcinoma presented with large right lower lobe lung mass in addition to other right upper lobe pulmonary nodule and right hilar and mediastinal lymphadenopathy in September 2024. She was seen by Dr. Delton Coombes for bronchoscopy on 05/17/2023.    Synopsis 82 year old female with a history of tobacco use (50 pack years), COPD, asthma, diabetes, hypertension, and seasonal allergies, was referred for an abnormal CT scan of the chest. 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 The subsequent chest x-ray and CT scan revealed a 6.8 by 5.8 cm mass in the right lower lobe, two additional retinopril nodules, and subcarinal lymphadenopathy. The patient was seen by an oncologist, and the possibility of lung malignancy was discussed. The patient was referred for bronchoscopy and biopsies to obtain a tissue diagnosis  to allow  planning of the next steps in therapy. She underwent bronchoscopy with biopsies 05/17/2023. She is here today to discuss cytology results and to ensure she is doing well after her biopsy.She is here today with her daughter.  05/25/2023 Pt. Presents for follow up. She states she has done well since her procedure . She states she did have some scant blood in her secretions the day after her procedure which has self resolved.  She denies any fever, discolored secretions, shortness of breath, or adverse reaction to anesthesia Patient was seen today 11/13  by Dr. Shirline Frees, medical oncology.  He reviewed her cytology results with her at that visit.  He reviewed her current disease stage, prognosis and treatment options.She has the option of control/curative treatment for her disease. She is not a good surgical candidate because of the stage of her disease as well as the age and comorbidities.   Current plan is for concurrent chemoradiation for 6 to 7 weeks followed by consolidation treatment with immunotherapy if the patient has no evidence of disease progression after the induction phase.  Patient is expected to start first dose of treatment on June 06, 2023.  She has been referred to radiation oncology.  Dr. Shirline Frees has arranged for chemotherapy education class prior to her first treatment.  Dr. Shirline Frees has also ordered Port-A-Cath placement by interventional radiology. Patient and her daughter verbalized understanding of the above and are in agreement with the treatment plan as outlined by Dr. Arbutus Ped. Patient is currently on Trelegy and Spiriva inhalers for COPD and asthma, and has not been on prednisone regularly.  Patient has additional stressors at present as her son has been in the hospital for 5 weeks after surgery.  She has resumed smoking 1 to 2 cigarettes daily.  I have counseled her to work hard on quitting completely.  I have given her options for smoking cessation assistance.   Test Results: Cytology 05/17/2023 LEFT LUNG, LOWER LOBE, BRUSHING: - Malignant - Non-small cell carcinoma, favor poorly differentiated squamous carcinoma  B.  LEFT LUNG, LOWER LOBE, FINE NEEDLE ASPIRATION  BIOPSY: - Malignant - Non-small cell carcinoma, favor poorly differentiated squamous carcinoma  COMMENT:  The brushing and fine-needle aspiration show large atypical cells with a marked nuclear to cytoplasmic ratio and an enlarged round oval irregular hyperchromatic nucleus with scattered conspicuous nucleoli.  Apoptotic bodies and mitotic figures are readily identified.  Tumor shows focal necrosis. Eight immunohistochemical stains performed on the cellblock with adequate control in an attempt to elucidate the histogenesis of  this tumor.  The tumor shows focal positivity for the squamous marker p40.  The tumor is negative for the squamous marker cytokeratin 5/6. The tumor is  negative for cytokeratin 7.  The tumor is negative for the pulmonary adeno markers TTF-1 and Napsin A.  The tumor is negative for the neuroendocrine markers CD56 and synaptophysin.  The tumor exhibits a high proliferation with a Ki-67 staining upwards of 80% of the tumor nuclei.   C. LYMPH NODE, STATION 7, FINE NEEDLE ASPIRATION:  - Malignant  - Metastatic non-small cell carcinoma, favor poorly differentiated  squamous carcinoma   D. LYMPH NODE, STATION 11R, FINE NEEDLE ASPIRATION:  - Negative for malignancy  - Compatible with benign lymph node   Chest CT: 6.8 x 5.8 cm posterior right lower lobe mass, 2.1 x 1.2 cm peripheral right upper lobe nodule, 1.3 x 1.3 cm subpleural intermedial right upper lobe nodule, 5 mm peripheral left lower lobe nodule, right infrahylar, hilar, subcarinal, and paratracheal lymphadenopathy, severe centrilobular emphysema (03/31/2023)     Latest Ref Rng & Units 05/05/2023    1:36 PM 05/17/2011    3:55 AM 05/15/2011    4:20 PM  CBC  WBC 4.0 - 10.5 K/uL 5.9     Hemoglobin 12.0 - 15.0 g/dL 16.1  8.1  9.7   Hematocrit 36.0 - 46.0 % 39.3  23.7  28.1   Platelets 150 - 400 K/uL 386          Latest Ref Rng & Units 05/05/2023    1:36 PM 05/10/2011   10:00 AM 01/13/2008    3:54 AM  BMP  Glucose 70 - 99 mg/dL 92  096  045   BUN 8 - 23 mg/dL 13  12  4    Creatinine 0.44 - 1.00 mg/dL 4.09  8.11  9.14   Sodium 135 - 145 mmol/L 137  138  139   Potassium 3.5 - 5.1 mmol/L 4.2  4.2  3.9   Chloride 98 - 111 mmol/L 102  103  104   CO2 22 - 32 mmol/L 26  24  28    Calcium 8.9 - 10.3 mg/dL 9.6  78.2  8.8     BNP No results found for: "BNP"  ProBNP No results found for: "PROBNP"  PFT No results found for: "FEV1PRE", "FEV1POST", "FVCPRE", "FVCPOST", "TLC", "DLCOUNC", "PREFEV1FVCRT", "PSTFEV1FVCRT"  MR BRAIN W WO CONTRAST  Result Date: 05/24/2023 CLINICAL DATA:  Provided history: Non-small cell lung cancer, staging. Malignant neoplasm of unspecified part of  unspecified bronchus or lung. EXAM: MRI HEAD WITHOUT AND WITH CONTRAST TECHNIQUE: Multiplanar, multiecho pulse sequences of the brain and surrounding structures were obtained without and with intravenous contrast. CONTRAST:  5mL GADAVIST GADOBUTROL 1 MMOL/ML IV SOLN COMPARISON:  PET CT 05/19/2023. FINDINGS: Brain: Mild generalized parenchymal volume loss. Multifocal T2 FLAIR hyperintense signal abnormality within the cerebral white matter and pons, nonspecific but compatible with mild chronic small vessel ischemic disease. There is no acute infarct. No evidence of an intracranial mass. No extra-axial fluid collection. No midline shift. No pathologic intracranial enhancement identified. Vascular: Maintained flow voids within the proximal large arterial vessels. Skull and upper cervical spine: No focal worrisome marrow lesion. Incompletely assessed cervical spondylosis. Sinuses/Orbits: No mass or acute finding within the imaged orbits. Prior bilateral ocular lens replacement. Trace mucosal thickening within a left ethmoid air cell. IMPRESSION: 1. No evidence of intracranial metastatic disease. 2. Mild chronic small vessel ischemic changes within the cerebral white matter and pons. 3. Mild generalized parenchymal volume  loss. Electronically Signed   By: Jackey Loge D.O.   On: 05/24/2023 11:55   NM PET Image Initial (PI) Skull Base To Thigh (F-18 FDG)  Result Date: 05/23/2023 CLINICAL DATA:  Initial treatment strategy for pulmonary mass. EXAM: NUCLEAR MEDICINE PET SKULL BASE TO THIGH TECHNIQUE: 5.7 mCi F-18 FDG was injected intravenously. Full-ring PET imaging was performed from the skull base to thigh after the radiotracer. CT data was obtained and used for attenuation correction and anatomic localization. Fasting blood glucose: 112 mg/dl COMPARISON:  CT chest 1610 FINDINGS: NECK: No hypermetabolic lymph nodes in the neck. Incidental CT findings: None. CHEST: RIGHT lower lobe mass measures 5.8 x 4.7 cm and has  intense metabolic activity with SUV max 13.3 (image 87). Hypermetabolic RIGHT infrahilar nodular thickening surrounding the proximal bronchus to the RIGHT lower lobe measures 2.4 cm (image 77) with SUV max 12.3 on image 81. Intensely hypermetabolic subcarinal lymph node measures 2.7 cm with SUV max equal 11.0. No supraclavicular adenopathy.  No contralateral adenopathy. Previously described RIGHT upper lobe nodule has resolved. Previous described LEFT lower lobe nodule is  not hypermetabolic. Incidental CT findings: Centrilobular emphysema the upper lobes. Coronary artery calcification and aortic atherosclerotic calcification. ABDOMEN/PELVIS: No abnormal hypermetabolic activity within the liver, pancreas, adrenal glands, or spleen. No hypermetabolic lymph nodes in the abdomen or pelvis. Incidental CT findings: Single gallstone. Atherosclerotic calcification of the aorta. SKELETON: No aggressive osseous lesion.  LEFT hip prosthetic Incidental CT findings: None. IMPRESSION: 1. Intensely hypermetabolic RIGHT lower lobe mass consistent with primary bronchogenic carcinoma. 2. Hypermetabolic RIGHT infrahilar and subcarinal lymph nodes consistent with nodal metastasis. 3. No evidence distant metastatic disease. 4. Aortic Atherosclerosis (ICD10-I70.0) and Emphysema (ICD10-J43.9). Electronically Signed   By: Genevive Bi M.D.   On: 05/23/2023 15:21   DG Chest Port 1 View  Result Date: 05/17/2023 CLINICAL DATA:  Status post bronchoscopy with biopsy. EXAM: PORTABLE CHEST 1 VIEW COMPARISON:  March 09, 2023. FINDINGS: The heart size and mediastinal contours are within normal limits. Continued presence of large rounded mass seen in right lower lobe consistent with malignancy. No definite pneumothorax is noted. Elevated left hemidiaphragm is noted with minimal left basilar subsegmental atelectasis. The visualized skeletal structures are unremarkable. IMPRESSION: Continued presence of large rounded mass seen in right lower  lobe consistent with malignancy. No definite pneumothorax is noted. Electronically Signed   By: Lupita Raider M.D.   On: 05/17/2023 16:33   DG C-ARM BRONCHOSCOPY  Result Date: 05/17/2023 C-ARM BRONCHOSCOPY: Fluoroscopy was utilized by the requesting physician.  No radiographic interpretation.     Past medical hx Past Medical History:  Diagnosis Date   Arthritis    Asthma    COPD (chronic obstructive pulmonary disease) (HCC)    Diabetes mellitus    patient denies   Hypertension    Seasonal allergies      Social History   Tobacco Use   Smoking status: Every Day    Current packs/day: 0.25    Average packs/day: 0.3 packs/day for 50.0 years (12.5 ttl pk-yrs)    Types: Cigarettes   Smokeless tobacco: Never   Tobacco comments:    Smoking 2-3 cigarettes a day as of 05/25/2023.  am  Vaping Use   Vaping status: Never Used  Substance Use Topics   Alcohol use: No   Drug use: No    Ms.Eavenson reports that she has been smoking cigarettes. She has a 12.5 pack-year smoking history. She has never used smokeless tobacco. She reports that she does  not drink alcohol and does not use drugs.  Tobacco Cessation: Ready to quit: Not Answered Counseling given: Not Answered Tobacco comments: Smoking 2-3 cigarettes a day as of 05/25/2023.  am   Past surgical hx, Family hx, Social hx all reviewed.  Current Outpatient Medications on File Prior to Visit  Medication Sig   albuterol (VENTOLIN HFA) 108 (90 Base) MCG/ACT inhaler Inhale 2 puffs into the lungs every 6 (six) hours as needed for wheezing or shortness of breath.   Fluticasone-Umeclidin-Vilant 100-62.5-25 MCG/INH AEPB Inhale 2 application into the lungs.   furosemide (LASIX) 20 MG tablet Take 1-2 tablets in the morning as needed for your foot swelling.   [START ON 06/05/2023] lidocaine-prilocaine (EMLA) cream Apply to affected area once   montelukast (SINGULAIR) 10 MG tablet Take 10 mg by mouth daily. For seasonal allergies   [START ON  06/05/2023] prochlorperazine (COMPAZINE) 10 MG tablet Take 1 tablet (10 mg total) by mouth every 6 (six) hours as needed for nausea or vomiting.   rosuvastatin (CRESTOR) 10 MG tablet Take 10 mg by mouth daily.     tiotropium (SPIRIVA) 18 MCG inhalation capsule Place 18 mcg into inhaler and inhale daily.   No current facility-administered medications on file prior to visit.     Allergies  Allergen Reactions   Erythromycin Hives    Review Of Systems:  Constitutional:   No  weight loss, night sweats,  Fevers, chills, fatigue, or  lassitude.  HEENT:   No headaches,  Difficulty swallowing,  Tooth/dental problems, or  Sore throat,                No sneezing, itching, ear ache, nasal congestion, post nasal drip,   CV:  No chest pain,  Orthopnea, PND, swelling in lower extremities, anasarca, dizziness, palpitations, syncope.   GI  No heartburn, indigestion, abdominal pain, nausea, vomiting, diarrhea, change in bowel habits, loss of appetite, bloody stools.   Resp: No shortness of breath with exertion or at rest.  No excess mucus, no productive cough,  No non-productive cough,  No coughing up of blood.  No change in color of mucus.  No wheezing.  No chest wall deformity  Skin: no rash or lesions.  GU: no dysuria, change in color of urine, no urgency or frequency.  No flank pain, no hematuria   MS:  No joint pain or swelling.  No decreased range of motion.  No back pain.  Psych:  No change in mood or affect. No depression or anxiety.  No memory loss.   Vital Signs BP 104/64 (BP Location: Left Arm, Patient Position: Sitting, Cuff Size: Normal)   Pulse 88   Temp 97.9 F (36.6 C) (Oral)   Ht 5' 9.5" (1.765 m)   Wt 116 lb 12.8 oz (53 kg)   SpO2 97%   BMI 17.00 kg/m    Physical Exam:  General- No distress,  A&Ox3, pleasant ENT: No sinus tenderness, TM clear, pale nasal mucosa, no oral exudate,no post nasal drip, no LAN Cardiac: S1, S2, regular rate and rhythm, no murmur Chest: No  wheeze/ rales/ dullness; no accessory muscle use, no nasal flaring, no sternal retractions Abd.: Soft Non-tender, ND, BS +, Body mass index is 17 kg/m.  Ext: No clubbing cyanosis, edema Neuro:  normal strength, MAE x 4, A&O x 3 Skin: No rashes, warm and dry, no lesions  Psych: normal mood and behavior   Assessment/Plan Newly diagnosed Stage IIIb (T4, N2, M0) non-small cell lung cancer, squamous cell carcinoma  in the left lower lobe and #7 node. Diagnosis per bronchoscopy per Dr. Delton Coombes Post bronchoscopy with biopsies Current smoker  Plan I am glad you have done well since your biopsy. Dr. Arbutus Ped has made all referrals , and ordered all procedures for your care.  All scheduled appointments are showing as pending in My Chart. Once they are scheduled, you will see them there.  Daisey Must with your treatment . Dr. Arbutus Ped and his team will take great care of you.  Have a good holiday season. Call us if you need [place]. Keep working on quitting smoking completely. You can receive free nicotine replacement therapy (patches, gum, or mints) by calling 1-800-QUIT NOW. Please call so we can get you on the path to becoming a non-smoker. I know it is hard, but you can do this!  Hypnosis for smoking cessation  [employer] [phone]  Acupuncture for smoking cessation  [employer] [phone]     I spent 25 minutes dedicated to the care of this patient on the date of this encounter to include pre-visit review of records, face-to-face time with the patient discussing conditions above, post visit ordering of testing, clinical documentation with the electronic health record, making appropriate referrals as documented, and communicating necessary information to the patient's healthcare team.    Bevelyn Ngo, NP 05/25/2023  11:48 AM

## 2023-05-25 NOTE — Patient Instructions (Addendum)
It is good to see you today. I am glad you have done well since your biopsy. Dr. Arbutus Ped has made all referrals , and scheduled all procedures.  All scheduled appointments are documented in My Chart. Daisey Must with your treatment . Have a good holiday season. Call us if you need [place]. Keep working on quitting smoking completely. You can receive free nicotine replacement therapy (patches, gum, or mints) by calling 1-800-QUIT NOW. Please call so we can get you on the path to becoming a non-smoker. I know it is hard, but you can do this!  Hypnosis for smoking cessation  [employer] [phone]  Acupuncture for smoking cessation  [employer] [phone]

## 2023-05-25 NOTE — Progress Notes (Signed)
[facility] [phone]   [phone]  OFFICE PROGRESS NOTE  [name], MD [address] [place] [address]  DIAGNOSIS: Stage IIIb (T4, N2, M0) non-small cell lung cancer, squamous cell carcinoma presented with large right lower lobe lung mass in addition to other right upper lobe pulmonary nodule and right hilar and mediastinal lymphadenopathy in September 2024.   PRIOR THERAPY: None  CURRENT THERAPY: A course of concurrent chemoradiation with weekly carboplatin for AUC of 2 and paclitaxel 45 Mg/M2.  First dose June 06, 2023.  INTERVAL HISTORY: Kristy Salazar 82 y.o. female returns to the clinic today for follow-up visit accompanied by her daughter Lamar Laundry.  The patient is feeling fine today with no concerning complaints.  She has no chest pain, shortness of breath, cough or hemoptysis.  She has no nausea, vomiting, diarrhea or constipation.  She has no headache or visual changes.  She has no recent weight loss or night sweats.  The patient underwent several studies recently including a PET scan on May 19, 2023 that showed the right lower lobe mass measuring 5.8 x 4.7 cm with intense metabolic activity and SUV max of 13.3.  There was also hypermetabolic right infra hilar nodular thickening surrounding the proximal bronchus to the right lower lobe measuring 2.4 cm with SUV max of 12.3 in addition to intensely hypermetabolic subcarinal lymph node measuring 2.7 cm with SUV max of 11.0.  There was no evidence of extrathoracic disease.  She also had MRI of the brain on May 13, 2023 and that showed no evidence of metastatic disease to the brain.  The patient underwent video bronchoscopy with robotic assisted bronchoscopic navigation and EBUS under the care of Dr. Delton Coombes on May 17, 2023 and the final pathology was consistent with non-small cell lung cancer, poorly differentiated squamous cell carcinoma.  She is here today for evaluation and  discussion of her treatment options.  MEDICAL HISTORY: Past Medical History:  Diagnosis Date   Arthritis    Asthma    COPD (chronic obstructive pulmonary disease) (HCC)    Diabetes mellitus    patient denies   Hypertension    Seasonal allergies     ALLERGIES:  is allergic to erythromycin.  MEDICATIONS:  Current Outpatient Medications  Medication Sig Dispense Refill   albuterol (VENTOLIN HFA) 108 (90 Base) MCG/ACT inhaler Inhale 2 puffs into the lungs every 6 (six) hours as needed for wheezing or shortness of breath. 8 g 2   Fluticasone-Umeclidin-Vilant 100-62.5-25 MCG/INH AEPB Inhale 2 application into the lungs.     furosemide (LASIX) 20 MG tablet Take 1-2 tablets in the morning as needed for your foot swelling. 20 tablet 0   montelukast (SINGULAIR) 10 MG tablet Take 10 mg by mouth daily. For seasonal allergies     rosuvastatin (CRESTOR) 10 MG tablet Take 10 mg by mouth daily.       tiotropium (SPIRIVA) 18 MCG inhalation capsule Place 18 mcg into inhaler and inhale daily.     No current facility-administered medications for this visit.    SURGICAL HISTORY:  Past Surgical History:  Procedure Laterality Date   ABDOMINAL HYSTERECTOMY     APPENDECTOMY     BACK SURGERY     BRONCHIAL BIOPSY  05/17/2023   Procedure: BRONCHIAL BIOPSIES;  Surgeon: [name], MD;  Location: [facility] ENDOSCOPY;  Service: Pulmonary;;   BRONCHIAL BRUSHINGS  05/17/2023   Procedure: BRONCHIAL BRUSHINGS;  Surgeon: [name], MD;  Location: [facility] ENDOSCOPY;  Service: Pulmonary;;   BRONCHIAL NEEDLE ASPIRATION BIOPSY  05/17/2023   Procedure: BRONCHIAL NEEDLE ASPIRATION BIOPSIES;  Surgeon: [name], MD;  Location: [facility] ENDOSCOPY;  Service: Pulmonary;;   JOINT REPLACEMENT     TOTAL HIP ARTHROPLASTY     TOTAL KNEE ARTHROPLASTY     VIDEO BRONCHOSCOPY WITH ENDOBRONCHIAL ULTRASOUND Right 05/17/2023   Procedure: VIDEO BRONCHOSCOPY WITH ENDOBRONCHIAL ULTRASOUND;  Surgeon: [name], MD;  Location: [facility]  ENDOSCOPY;  Service: Pulmonary;  Laterality: Right;    REVIEW OF SYSTEMS:  Constitutional: positive for fatigue Eyes: negative Ears, nose, mouth, throat, and face: negative Respiratory: negative Cardiovascular: negative Gastrointestinal: negative Genitourinary:negative Integument/breast: negative Hematologic/lymphatic: negative Musculoskeletal:negative Neurological: negative Behavioral/Psych: negative Endocrine: negative Allergic/Immunologic: negative   PHYSICAL EXAMINATION: General appearance: alert, cooperative, fatigued, and no distress Head: Normocephalic, without obvious abnormality, atraumatic Neck: no adenopathy, no JVD, supple, symmetrical, trachea midline, and thyroid not enlarged, symmetric, no tenderness/mass/nodules Lymph nodes: Cervical, supraclavicular, and axillary nodes normal. Resp: clear to auscultation bilaterally Back: symmetric, no curvature. ROM normal. No CVA tenderness. Cardio: regular rate and rhythm, S1, S2 normal, no murmur, click, rub or gallop GI: soft, non-tender; bowel sounds normal; no masses,  no organomegaly Extremities: extremities normal, atraumatic, no cyanosis or edema Neurologic: Alert and oriented X 3, normal strength and tone. Normal symmetric reflexes. Normal coordination and gait  ECOG PERFORMANCE STATUS: 1 - Symptomatic but completely ambulatory  Blood pressure 127/66, pulse 75, temperature 98.1 F (36.7 C), temperature source Temporal, resp. rate 17, height 5\' 10"  (1.778 m), weight 115 lb 11.2 oz (52.5 kg), SpO2 100%.  LABORATORY DATA: Lab Results  Component Value Date   WBC 5.9 05/05/2023   HGB 13.2 05/05/2023   HCT 39.3 05/05/2023   MCV 91.4 05/05/2023   PLT 386 05/05/2023      Chemistry      Component Value Date/Time   NA 137 05/05/2023 1336   K 4.2 05/05/2023 1336   CL 102 05/05/2023 1336   CO2 26 05/05/2023 1336   BUN 13 05/05/2023 1336   CREATININE 0.75 05/05/2023 1336      Component Value Date/Time   CALCIUM  9.6 05/05/2023 1336   ALKPHOS 68 05/05/2023 1336   AST 22 05/05/2023 1336   ALT 19 05/05/2023 1336   BILITOT 0.3 05/05/2023 1336       RADIOGRAPHIC STUDIES: MR BRAIN W WO CONTRAST  Result Date: 05/24/2023 CLINICAL DATA:  Provided history: Non-small cell lung cancer, staging. Malignant neoplasm of unspecified part of unspecified bronchus or lung. EXAM: MRI HEAD WITHOUT AND WITH CONTRAST TECHNIQUE: Multiplanar, multiecho pulse sequences of the brain and surrounding structures were obtained without and with intravenous contrast. CONTRAST:  5mL GADAVIST GADOBUTROL 1 MMOL/ML IV SOLN COMPARISON:  PET CT 05/19/2023. FINDINGS: Brain: Mild generalized parenchymal volume loss. Multifocal T2 FLAIR hyperintense signal abnormality within the cerebral white matter and pons, nonspecific but compatible with mild chronic small vessel ischemic disease. There is no acute infarct. No evidence of an intracranial mass. No extra-axial fluid collection. No midline shift. No pathologic intracranial enhancement identified. Vascular: Maintained flow voids within the proximal large arterial vessels. Skull and upper cervical spine: No focal worrisome marrow lesion. Incompletely assessed cervical spondylosis. Sinuses/Orbits: No mass or acute finding within the imaged orbits. Prior bilateral ocular lens replacement. Trace mucosal thickening within a left ethmoid air cell. IMPRESSION: 1. No evidence of intracranial metastatic disease. 2. Mild chronic small vessel ischemic changes within the cerebral white matter and pons. 3. Mild generalized  parenchymal volume loss. Electronically Signed   By: Jackey Loge D.O.   On: 05/24/2023 11:55   NM PET Image Initial (PI) Skull Base To Thigh (F-18 FDG)  Result Date: 05/23/2023 CLINICAL DATA:  Initial treatment strategy for pulmonary mass. EXAM: NUCLEAR MEDICINE PET SKULL BASE TO THIGH TECHNIQUE: 5.7 mCi F-18 FDG was injected intravenously. Full-ring PET imaging was performed from the skull  base to thigh after the radiotracer. CT data was obtained and used for attenuation correction and anatomic localization. Fasting blood glucose: 112 mg/dl COMPARISON:  CT chest 1324 FINDINGS: NECK: No hypermetabolic lymph nodes in the neck. Incidental CT findings: None. CHEST: RIGHT lower lobe mass measures 5.8 x 4.7 cm and has intense metabolic activity with SUV max 13.3 (image 87). Hypermetabolic RIGHT infrahilar nodular thickening surrounding the proximal bronchus to the RIGHT lower lobe measures 2.4 cm (image 77) with SUV max 12.3 on image 81. Intensely hypermetabolic subcarinal lymph node measures 2.7 cm with SUV max equal 11.0. No supraclavicular adenopathy.  No contralateral adenopathy. Previously described RIGHT upper lobe nodule has resolved. Previous described LEFT lower lobe nodule is  not hypermetabolic. Incidental CT findings: Centrilobular emphysema the upper lobes. Coronary artery calcification and aortic atherosclerotic calcification. ABDOMEN/PELVIS: No abnormal hypermetabolic activity within the liver, pancreas, adrenal glands, or spleen. No hypermetabolic lymph nodes in the abdomen or pelvis. Incidental CT findings: Single gallstone. Atherosclerotic calcification of the aorta. SKELETON: No aggressive osseous lesion.  LEFT hip prosthetic Incidental CT findings: None. IMPRESSION: 1. Intensely hypermetabolic RIGHT lower lobe mass consistent with primary bronchogenic carcinoma. 2. Hypermetabolic RIGHT infrahilar and subcarinal lymph nodes consistent with nodal metastasis. 3. No evidence distant metastatic disease. 4. Aortic Atherosclerosis (ICD10-I70.0) and Emphysema (ICD10-J43.9). Electronically Signed   By: Genevive Bi M.D.   On: 05/23/2023 15:21   DG Chest Port 1 View  Result Date: 05/17/2023 CLINICAL DATA:  Status post bronchoscopy with biopsy. EXAM: PORTABLE CHEST 1 VIEW COMPARISON:  March 09, 2023. FINDINGS: The heart size and mediastinal contours are within normal limits. Continued  presence of large rounded mass seen in right lower lobe consistent with malignancy. No definite pneumothorax is noted. Elevated left hemidiaphragm is noted with minimal left basilar subsegmental atelectasis. The visualized skeletal structures are unremarkable. IMPRESSION: Continued presence of large rounded mass seen in right lower lobe consistent with malignancy. No definite pneumothorax is noted. Electronically Signed   By: Lupita Raider M.D.   On: 05/17/2023 16:33   DG C-ARM BRONCHOSCOPY  Result Date: 05/17/2023 C-ARM BRONCHOSCOPY: Fluoroscopy was utilized by the requesting physician.  No radiographic interpretation.    ASSESSMENT AND PLAN: This is a very pleasant 82 years old African-American female with stage IIIb (T4, N2, M0) non-small cell lung cancer, squamous cell carcinoma presented with large right lower lobe lung mass in addition to other right upper lobe pulmonary nodule and right hilar and mediastinal lymphadenopathy in September 2024.  I had a lengthy discussion with the patient today about her current disease stage, prognosis and treatment options. I explained to the patient that she has the option of control/curative treatment for her disease.  She is not a good surgical candidate because of the stage of her disease as well as the age and comorbidities. I recommended for the patient a course of concurrent chemoradiation with weekly carboplatin for AUC of 2 and paclitaxel 45 Mg/M2 for 6-7 weeks followed by consolidation treatment with immunotherapy with Imfinzi if the patient has no evidence for disease progression after the induction phase. I discussed with  the patient the adverse effect of this treatment including but not limited to alopecia, myelosuppression, nausea and vomiting, peripheral neuropathy, liver or renal dysfunction. The patient is expected to start the first dose of her treatment on June 06, 2023. I will refer her to radiation oncology for evaluation and discussion  of the radiotherapy option. I will arrange for the patient to have a chemotherapy education class before the first dose of her treatment. For the IV access, I will order a Port-A-Cath placement by interventional radiology. I will call her pharmacy with prescription for Compazine in addition to Emla cream. She will come back for follow-up visit 1 week after the start of her treatment. The patient was advised to call immediately if she has any other concerning symptoms in the interval. The patient voices understanding of current disease status and treatment options and is in agreement with the current care plan.  All questions were answered. The patient knows to call the clinic with any problems, questions or concerns. We can certainly see the patient much sooner if necessary.  The total time spent in the appointment was 30 minutes.  Disclaimer: This note was dictated with voice recognition software. Similar sounding words can inadvertently be transcribed and may not be corrected upon review.

## 2023-05-25 NOTE — Progress Notes (Signed)
START ON PATHWAY REGIMEN - Non-Small Cell Lung     A cycle is every 7 days, concurrent with RT:     Paclitaxel      Carboplatin   **Always confirm dose/schedule in your pharmacy ordering system**  Patient Characteristics: Preoperative or Nonsurgical Candidate (Clinical Staging), Stage III - Nonsurgical Candidate (Nonsquamous and Squamous), PS = 0,1, EGFR Mutation - Common (Exon 19 Deletion or Exon 21 L858R Substitution) Negative Therapeutic Status: Preoperative or Nonsurgical Candidate (Clinical Staging) AJCC T Category: cT3 AJCC N Category: cN2 AJCC M Category: cM0 AJCC 8 Stage Grouping: IIIB ECOG Performance Status: 1 EGFR Mutation - Common (Exon 19 Deletion or Exon 21 L858R Substitution): Negative Intent of Therapy: Curative Intent, Discussed with Patient

## 2023-05-26 ENCOUNTER — Other Ambulatory Visit: Payer: Self-pay

## 2023-05-27 ENCOUNTER — Encounter: Payer: Self-pay | Admitting: Radiation Oncology

## 2023-05-27 ENCOUNTER — Ambulatory Visit
Admission: RE | Admit: 2023-05-27 | Discharge: 2023-05-27 | Disposition: A | Discharge status: Home / Self Care | Payer: BC Managed Care – PPO | Source: Ambulatory Visit | Attending: Radiation Oncology | Admitting: Radiation Oncology

## 2023-05-27 ENCOUNTER — Other Ambulatory Visit: Payer: Self-pay

## 2023-05-27 ENCOUNTER — Ambulatory Visit: Payer: BC Managed Care – PPO | Admitting: Acute Care

## 2023-05-27 VITALS — BP 116/70 | HR 80 | Temp 97.5°F | Resp 18 | Ht 69.5 in | Wt 112.0 lb

## 2023-05-27 DIAGNOSIS — I700 Atherosclerosis of aorta: Secondary | ICD-10-CM | POA: Insufficient documentation

## 2023-05-27 DIAGNOSIS — J449 Chronic obstructive pulmonary disease, unspecified: Secondary | ICD-10-CM | POA: Diagnosis not present

## 2023-05-27 DIAGNOSIS — E119 Type 2 diabetes mellitus without complications: Secondary | ICD-10-CM | POA: Insufficient documentation

## 2023-05-27 DIAGNOSIS — C3431 Malignant neoplasm of lower lobe, right bronchus or lung: Secondary | ICD-10-CM | POA: Insufficient documentation

## 2023-05-27 DIAGNOSIS — I6782 Cerebral ischemia: Secondary | ICD-10-CM | POA: Diagnosis not present

## 2023-05-27 DIAGNOSIS — F17210 Nicotine dependence, cigarettes, uncomplicated: Secondary | ICD-10-CM | POA: Diagnosis not present

## 2023-05-27 DIAGNOSIS — I10 Essential (primary) hypertension: Secondary | ICD-10-CM | POA: Insufficient documentation

## 2023-05-27 DIAGNOSIS — Z79899 Other long term (current) drug therapy: Secondary | ICD-10-CM | POA: Insufficient documentation

## 2023-05-27 LAB — CYTOLOGY - NON PAP

## 2023-05-27 NOTE — Progress Notes (Signed)
The proposed treatment discussed in conference is for discussion purpose only and is not a binding recommendation.  The patients have not been physically examined, or presented with their treatment options.  Therefore, final treatment plans cannot be decided.  

## 2023-05-27 NOTE — Progress Notes (Signed)
Radiation Oncology         [phone] ________________________________  Name: Kristy Salazar        MRN: [id number]  Date of Service: 05/27/2023 DOB: 12-06-1940  NG:EXBMW, Kristy Saran, MD  Si Gaul, MD     REFERRING PHYSICIAN: Si Gaul, MD   DIAGNOSIS: Stage IIIb (T4, N2, M0) non-small cell lung cancer, squamous cell carcinoma presented with large right lower lobe lung mass in addition to other right upper lobe pulmonary nodule and right hilar and mediastinal lymphadenopathy in September 2024.   The encounter diagnosis was Primary squamous cell carcinoma of bronchus of right lower lobe (HCC).   HISTORY OF PRESENT ILLNESS: Kristy Salazar is a 82 y.o. female seen at the request of Dr. Arbutus Ped for a newly diagnosed right lung mass.   The patient originally presented to an Urgent Care for an episode of shortness of breath while at work. Chest X-ray on 03/09/2023 revealed a concerning mass in the right lung. Subsequent CT scan on 03/31/2023 confirmed the presence of a 6.8 x 5.8 cm mass in the right lower lobe; two additional nodules in the right upper lobe; and enlarged right hilar, subcarinal, and paratracheal lymph nodes. PET scan on 05/19/2023 confirmed a hypermetabolic right lower lobe mass and hypermetabolic right infrahilar and subcarinal lymph nodes. No evidence of distant metastatic disease was appreciated.  Patient met with Dr. Shirline Frees on 05-05-2023 who recommended full staging workup and biopsy of the suspicious lung mass. MRI of the brain on 05/13/2023 revealed no evidence of intracranial metastatic disease. Patient underwent bronchoscopy and biopsy on 05/17/2023 under the care of Dr. Delton Coombes. Final pathology revealed non-small cell carcinoma, favoring poorly differentiated squamous cell carcinoma in the LLL and metastatic disease in the station 7 lymph node. Patient again met with Dr. Arbutus Ped on 05/25/2023 who reviewed the current work-up with the patient. He recommended  concurrent chemoradiation with weekly carboplatin for AUC of 2 and paclitaxel 45 Mg/M2 for 6-7 weeks followed by consolidation treatment with immunotherapy with Imfinzi if the patient has no evidence for disease progression after the induction phase.   She was kindly referred to [place] today to discuss radiation options.   PREVIOUS RADIATION THERAPY: No   PAST MEDICAL HISTORY:  Past Medical History:  Diagnosis Date   Arthritis    Asthma    COPD (chronic obstructive pulmonary disease) (HCC)    Diabetes mellitus    patient denies   Hypertension    Seasonal allergies        PAST SURGICAL HISTORY: Past Surgical History:  Procedure Laterality Date   ABDOMINAL HYSTERECTOMY     APPENDECTOMY     BACK SURGERY     BRONCHIAL BIOPSY  05/17/2023   Procedure: BRONCHIAL BIOPSIES;  Surgeon: [name], MD;  Location: [facility] ENDOSCOPY;  Service: Pulmonary;;   BRONCHIAL BRUSHINGS  05/17/2023   Procedure: BRONCHIAL BRUSHINGS;  Surgeon: [name], MD;  Location: [facility] ENDOSCOPY;  Service: Pulmonary;;   BRONCHIAL NEEDLE ASPIRATION BIOPSY  05/17/2023   Procedure: BRONCHIAL NEEDLE ASPIRATION BIOPSIES;  Surgeon: [name], MD;  Location: [facility] ENDOSCOPY;  Service: Pulmonary;;   JOINT REPLACEMENT     TOTAL HIP ARTHROPLASTY     TOTAL KNEE ARTHROPLASTY     VIDEO BRONCHOSCOPY WITH ENDOBRONCHIAL ULTRASOUND Right 05/17/2023   Procedure: VIDEO BRONCHOSCOPY WITH ENDOBRONCHIAL ULTRASOUND;  Surgeon: [name], MD;  Location: [facility] ENDOSCOPY;  Service: Pulmonary;  Laterality: Right;     FAMILY HISTORY: History reviewed. No pertinent family history.  SOCIAL HISTORY:  reports that she has been smoking cigarettes. She has a 12.5 pack-year smoking history. She has never used smokeless tobacco. She reports that she does not drink alcohol and does not use drugs.   ALLERGIES: Erythromycin   MEDICATIONS:  Current Outpatient Medications  Medication Sig Dispense Refill   albuterol (VENTOLIN HFA) 108 (90  Base) MCG/ACT inhaler Inhale 2 puffs into the lungs every 6 (six) hours as needed for wheezing or shortness of breath. 8 g 2   Fluticasone-Umeclidin-Vilant 100-62.5-25 MCG/INH AEPB Inhale 2 application into the lungs.     furosemide (LASIX) 20 MG tablet Take 1-2 tablets in the morning as needed for your foot swelling. 20 tablet 0   [START ON 06/05/2023] lidocaine-prilocaine (EMLA) cream Apply to affected area once 30 g 3   montelukast (SINGULAIR) 10 MG tablet Take 10 mg by mouth daily. For seasonal allergies     [START ON 06/05/2023] prochlorperazine (COMPAZINE) 10 MG tablet Take 1 tablet (10 mg total) by mouth every 6 (six) hours as needed for nausea or vomiting. 30 tablet 1   rosuvastatin (CRESTOR) 10 MG tablet Take 10 mg by mouth daily.       tiotropium (SPIRIVA) 18 MCG inhalation capsule Place 18 mcg into inhaler and inhale daily.     No current facility-administered medications for this encounter.     REVIEW OF SYSTEMS: On review of systems, the patient reports that she is doing well overall. She experiences some shortness of breath with exertion, but otherwise denies any chest pain, cough, or hemoptysis. She has had ~10 lb unintentional weight loss in the past couple months.      PHYSICAL EXAM:  Wt Readings from Last 3 Encounters:  05/27/23 112 lb (50.8 kg)  05/25/23 116 lb 12.8 oz (53 kg)  05/25/23 115 lb 11.2 oz (52.5 kg)   Temp Readings from Last 3 Encounters:  05/27/23 (!) 97.5 F (36.4 C) (Temporal)  05/25/23 97.9 F (36.6 C) (Oral)  05/25/23 98.1 F (36.7 C) (Temporal)   BP Readings from Last 3 Encounters:  05/27/23 116/70  05/25/23 104/64  05/25/23 127/66   Pulse Readings from Last 3 Encounters:  05/27/23 80  05/25/23 88  05/25/23 75   Pain Assessment Pain Score: 0-No pain/10  In general this is a well appearing female in no acute distress. She's alert and oriented x4 and appropriate throughout the examination. Cardiopulmonary assessment is negative for acute  distress and she exhibits normal effort.     ECOG = 0  0 - Asymptomatic (Fully active, able to carry on all predisease activities without restriction)  1 - Symptomatic but completely ambulatory (Restricted in physically strenuous activity but ambulatory and able to carry out work of a light or sedentary nature. For example, light housework, office work)  2 - Symptomatic, <50% in bed during the day (Ambulatory and capable of all self care but unable to carry out any work activities. Up and about more than 50% of waking hours)  3 - Symptomatic, >50% in bed, but not bedbound (Capable of only limited self-care, confined to bed or chair 50% or more of waking hours)  4 - Bedbound (Completely disabled. Cannot carry on any self-care. Totally confined to bed or chair)  5 - Death   Santiago Glad MM, Creech RH, Tormey DC, et al. [phone]). "Toxicity and response criteria of the [facility]". Am. Evlyn Clines. Oncol. 5 (6): 649-55    LABORATORY DATA:  Lab Results  Component Value Date  WBC 5.9 05/05/2023   HGB 13.2 05/05/2023   HCT 39.3 05/05/2023   MCV 91.4 05/05/2023   PLT 386 05/05/2023   Lab Results  Component Value Date   NA 137 05/05/2023   K 4.2 05/05/2023   CL 102 05/05/2023   CO2 26 05/05/2023   Lab Results  Component Value Date   ALT 19 05/05/2023   AST 22 05/05/2023   ALKPHOS 68 05/05/2023   BILITOT 0.3 05/05/2023      RADIOGRAPHY: MR BRAIN W WO CONTRAST  Result Date: 05/24/2023 CLINICAL DATA:  Provided history: Non-small cell lung cancer, staging. Malignant neoplasm of unspecified part of unspecified bronchus or lung. EXAM: MRI HEAD WITHOUT AND WITH CONTRAST TECHNIQUE: Multiplanar, multiecho pulse sequences of the brain and surrounding structures were obtained without and with intravenous contrast. CONTRAST:  5mL GADAVIST GADOBUTROL 1 MMOL/ML IV SOLN COMPARISON:  PET CT 05/19/2023. FINDINGS: Brain: Mild generalized parenchymal volume loss. Multifocal T2 FLAIR  hyperintense signal abnormality within the cerebral white matter and pons, nonspecific but compatible with mild chronic small vessel ischemic disease. There is no acute infarct. No evidence of an intracranial mass. No extra-axial fluid collection. No midline shift. No pathologic intracranial enhancement identified. Vascular: Maintained flow voids within the proximal large arterial vessels. Skull and upper cervical spine: No focal worrisome marrow lesion. Incompletely assessed cervical spondylosis. Sinuses/Orbits: No mass or acute finding within the imaged orbits. Prior bilateral ocular lens replacement. Trace mucosal thickening within a left ethmoid air cell. IMPRESSION: 1. No evidence of intracranial metastatic disease. 2. Mild chronic small vessel ischemic changes within the cerebral white matter and pons. 3. Mild generalized parenchymal volume loss. Electronically Signed   By: Jackey Loge D.O.   On: 05/24/2023 11:55   NM PET Image Initial (PI) Skull Base To Thigh (F-18 FDG)  Result Date: 05/23/2023 CLINICAL DATA:  Initial treatment strategy for pulmonary mass. EXAM: NUCLEAR MEDICINE PET SKULL BASE TO THIGH TECHNIQUE: 5.7 mCi F-18 FDG was injected intravenously. Full-ring PET imaging was performed from the skull base to thigh after the radiotracer. CT data was obtained and used for attenuation correction and anatomic localization. Fasting blood glucose: 112 mg/dl COMPARISON:  CT chest 1610 FINDINGS: NECK: No hypermetabolic lymph nodes in the neck. Incidental CT findings: None. CHEST: RIGHT lower lobe mass measures 5.8 x 4.7 cm and has intense metabolic activity with SUV max 13.3 (image 87). Hypermetabolic RIGHT infrahilar nodular thickening surrounding the proximal bronchus to the RIGHT lower lobe measures 2.4 cm (image 77) with SUV max 12.3 on image 81. Intensely hypermetabolic subcarinal lymph node measures 2.7 cm with SUV max equal 11.0. No supraclavicular adenopathy.  No contralateral adenopathy.  Previously described RIGHT upper lobe nodule has resolved. Previous described LEFT lower lobe nodule is  not hypermetabolic. Incidental CT findings: Centrilobular emphysema the upper lobes. Coronary artery calcification and aortic atherosclerotic calcification. ABDOMEN/PELVIS: No abnormal hypermetabolic activity within the liver, pancreas, adrenal glands, or spleen. No hypermetabolic lymph nodes in the abdomen or pelvis. Incidental CT findings: Single gallstone. Atherosclerotic calcification of the aorta. SKELETON: No aggressive osseous lesion.  LEFT hip prosthetic Incidental CT findings: None. IMPRESSION: 1. Intensely hypermetabolic RIGHT lower lobe mass consistent with primary bronchogenic carcinoma. 2. Hypermetabolic RIGHT infrahilar and subcarinal lymph nodes consistent with nodal metastasis. 3. No evidence distant metastatic disease. 4. Aortic Atherosclerosis (ICD10-I70.0) and Emphysema (ICD10-J43.9). Electronically Signed   By: Genevive Bi M.D.   On: 05/23/2023 15:21   DG Chest Port 1 View  Result Date: 05/17/2023 CLINICAL DATA:  Status post bronchoscopy with biopsy. EXAM: PORTABLE CHEST 1 VIEW COMPARISON:  March 09, 2023. FINDINGS: The heart size and mediastinal contours are within normal limits. Continued presence of large rounded mass seen in right lower lobe consistent with malignancy. No definite pneumothorax is noted. Elevated left hemidiaphragm is noted with minimal left basilar subsegmental atelectasis. The visualized skeletal structures are unremarkable. IMPRESSION: Continued presence of large rounded mass seen in right lower lobe consistent with malignancy. No definite pneumothorax is noted. Electronically Signed   By: Lupita Raider M.D.   On: 05/17/2023 16:33   DG C-ARM BRONCHOSCOPY  Result Date: 05/17/2023 C-ARM BRONCHOSCOPY: Fluoroscopy was utilized by the requesting physician.  No radiographic interpretation.       IMPRESSION/PLAN: 1. Stage IIIb (T4, N2, M0) non-small cell  lung cancer, squamous cell carcinoma presented with large right lower lobe lung mass in addition to other right upper lobe pulmonary nodule and right hilar and mediastinal lymphadenopathy in September 2024.    Dr. Mitzi Hansen reviewed the patient's case and imaging. PET from 05/19/2023 demonstrates hypermetabolic right lower lobe mass along with right infrahilar and subcarinal lymph nodes. Dr. Mitzi Hansen is in agreement with concurrent chemoradiation for the patient's stage IIIb lung disease.   Today, we talked to the patient and family about the findings and work-up thus far.  We discussed the natural history of stage III lung cancer and general treatment, highlighting the role of radiotherapy in the management.  We discussed the available radiation techniques, and focused on the details of logistics and delivery.  We reviewed the anticipated acute and late sequelae associated with radiation in this setting.  The patient was encouraged to ask questions that I answered to the best of my ability. A patient consent form was discussed and signed.  We retained a copy for our records. The patient would like to proceed with radiation and will be scheduled for CT simulation.  Patient is scheduled for her first chemotherapy infusion on 06/06/2023.  We will get her scheduled for CT simulation and have her start her first radiation treatment on 06/06/2023.  We will share our discussion today with Dr. Shirline Frees and we look forward to participating in this patient's care.  In a visit lasting 60 minutes, greater than 50% of the time was spent face to face discussing the patient's condition, in preparation for the discussion, and coordinating the patient's care.   The above documentation reflects my direct findings during this shared patient visit. Please see the separate note by Dr. Mitzi Hansen on this date for the remainder of the patient's plan of care.    Joyice Faster, PA-C    **Disclaimer: This note was dictated with voice  recognition software. Similar sounding words can inadvertently be transcribed and this note may contain transcription errors which may not have been corrected upon publication of note.**

## 2023-05-29 ENCOUNTER — Other Ambulatory Visit: Payer: Self-pay

## 2023-05-30 NOTE — Progress Notes (Signed)

## 2023-05-31 ENCOUNTER — Ambulatory Visit
Admission: RE | Admit: 2023-05-31 | Discharge: 2023-05-31 | Disposition: A | Discharge status: Home / Self Care | Payer: BC Managed Care – PPO | Source: Ambulatory Visit | Attending: Radiation Oncology | Admitting: Radiation Oncology

## 2023-05-31 ENCOUNTER — Other Ambulatory Visit: Payer: Self-pay | Admitting: Urology

## 2023-05-31 DIAGNOSIS — Z510 Encounter for antineoplastic radiation therapy: Secondary | ICD-10-CM | POA: Insufficient documentation

## 2023-05-31 DIAGNOSIS — C3431 Malignant neoplasm of lower lobe, right bronchus or lung: Secondary | ICD-10-CM | POA: Insufficient documentation

## 2023-05-31 DIAGNOSIS — F17210 Nicotine dependence, cigarettes, uncomplicated: Secondary | ICD-10-CM | POA: Insufficient documentation

## 2023-06-01 ENCOUNTER — Other Ambulatory Visit: Payer: Self-pay | Admitting: Urology

## 2023-06-01 MED ORDER — SODIUM CHLORIDE 0.45 % IV SOLN: INTRAVENOUS | Status: AC

## 2023-06-02 ENCOUNTER — Other Ambulatory Visit: Payer: BC Managed Care – PPO

## 2023-06-02 NOTE — H&P (Signed)
Chief Complaint: Patient was seen in consultation today for Port-A-Cath placement for management of squamous cell carcinoma of bronchus of right lower lobe, at the request of Dr. Si Gaul  Supervising Physician: Richarda Overlie  Patient Status: [facility] - Out-pt  History of Present Illness: Kristy Salazar is a 82 y.o. female   FULL Code status per patient.  Patient was diagnosed with stage IIIb (T4, N2, M0) non-small cell lung cancer, squamous cell carcinoma presented with large right lower lobe lung mass in addition to other right upper lobe pulmonary nodule and right hilar and mediastinal lymphadenopathy in September 2024.   She underwent several studies recently including: -PET scan on May 19, 2023 that showed the right lower lobe mass measuring 5.8 x 4.7 cm with intense metabolic activity and SUV max of 13.3.  -MRI of the brain on May 13, 2023 and that showed no evidence of metastatic disease to the brain. -Video bronchoscopy with robotic assisted bronchoscopic navigation and EBUS under the care of Dr. Delton Coombes on May 17, 2023 and the final pathology was consistent with non-small cell lung cancer, poorly differentiated squamous cell carcinoma.   The patient is expected to start the first dose of her treatment on June 06, 2023. For IV access and chemotherapies, Dr. Arbutus Ped requested a Port-A-Cath placement by IR. Patient is scheduled for same today.    Past Medical History:  Diagnosis Date   Arthritis    Asthma    COPD (chronic obstructive pulmonary disease) (HCC)    Diabetes mellitus    patient denies   Hypertension    Seasonal allergies     Past Surgical History:  Procedure Laterality Date   ABDOMINAL HYSTERECTOMY     APPENDECTOMY     BACK SURGERY     BRONCHIAL BIOPSY  05/17/2023   Procedure: BRONCHIAL BIOPSIES;  Surgeon: [name], MD;  Location: [facility] ENDOSCOPY;  Service: Pulmonary;;   BRONCHIAL BRUSHINGS  05/17/2023   Procedure: BRONCHIAL  BRUSHINGS;  Surgeon: [name], MD;  Location: [facility] ENDOSCOPY;  Service: Pulmonary;;   BRONCHIAL NEEDLE ASPIRATION BIOPSY  05/17/2023   Procedure: BRONCHIAL NEEDLE ASPIRATION BIOPSIES;  Surgeon: [name], MD;  Location: [facility] ENDOSCOPY;  Service: Pulmonary;;   JOINT REPLACEMENT     TOTAL HIP ARTHROPLASTY     TOTAL KNEE ARTHROPLASTY     VIDEO BRONCHOSCOPY WITH ENDOBRONCHIAL ULTRASOUND Right 05/17/2023   Procedure: VIDEO BRONCHOSCOPY WITH ENDOBRONCHIAL ULTRASOUND;  Surgeon: [name], MD;  Location: [facility] ENDOSCOPY;  Service: Pulmonary;  Laterality: Right;    Allergies: Erythromycin  Medications: Prior to Admission medications   Medication Sig Start Date End Date Taking? Authorizing Provider  albuterol (VENTOLIN HFA) 108 (90 Base) MCG/ACT inhaler Inhale 2 puffs into the lungs every 6 (six) hours as needed for wheezing or shortness of breath. 08/28/21   Elson Areas, PA-C  Fluticasone-Umeclidin-Vilant 100-62.5-25 MCG/INH AEPB Inhale 2 application into the lungs.    [provider]  furosemide (LASIX) 20 MG tablet Take 1-2 tablets in the morning as needed for your foot swelling. 02/05/23   Mardella Layman, MD  lidocaine-prilocaine (EMLA) cream Apply to affected area once 06/05/23   Si Gaul, MD  montelukast (SINGULAIR) 10 MG tablet Take 10 mg by mouth daily. For seasonal allergies    [provider]  prochlorperazine (COMPAZINE) 10 MG tablet Take 1 tablet (10 mg total) by mouth every 6 (six) hours as needed for nausea or vomiting. 06/05/23   Si Gaul, MD  rosuvastatin (CRESTOR) 10 MG  tablet Take 10 mg by mouth daily.      [provider]  tiotropium (SPIRIVA) 18 MCG inhalation capsule Place 18 mcg into inhaler and inhale daily.    [provider]     No family history on file.  Social History   Socioeconomic History   Marital status: Legally Separated    Spouse name: Not on file   Number of children: Not on file   Years of  education: Not on file   Highest education level: Not on file  Occupational History   Not on file  Tobacco Use   Smoking status: Every Day    Current packs/day: 0.25    Average packs/day: 0.3 packs/day for 50.0 years (12.5 ttl pk-yrs)    Types: Cigarettes   Smokeless tobacco: Never   Tobacco comments:    Smoking 2-3 cigarettes a day as of 05/25/2023.  am  Vaping Use   Vaping status: Never Used  Substance and Sexual Activity   Alcohol use: No   Drug use: No   Sexual activity: Not on file  Other Topics Concern   Not on file  Social History Narrative   Not on file   Social Determinants of Health   Financial Resource Strain: Not on file  Food Insecurity: No Food Insecurity (05/27/2023)   Hunger Vital Sign    Worried About Running Out of Food in the Last Year: Never true    Ran Out of Food in the Last Year: Never true  Transportation Needs: No Transportation Needs (05/27/2023)   PRAPARE - Administrator, Civil Service (Medical): No    Lack of Transportation (Non-Medical): No  Physical Activity: Not on file  Stress: Not on file  Social Connections: Not on file      Review of Systems: denies fever,HA,CP,abd/back pain,N/V or bleeding; she does have chronic dyspnea, occ cough  Vital Signs: Vitals:   06/03/23 1209  BP: 132/71  Pulse: 76  Resp: 16  Temp: 98 F (36.7 C)  SpO2: 96%     Advance Care Plan: no documents on file    Physical Exam: awake/alert; chest- distant BS bilat; heart- RRR; abd-soft,+BS,NT; no LE edema  Imaging: MR BRAIN W WO CONTRAST  Result Date: 05/24/2023 CLINICAL DATA:  Provided history: Non-small cell lung cancer, staging. Malignant neoplasm of unspecified part of unspecified bronchus or lung. EXAM: MRI HEAD WITHOUT AND WITH CONTRAST TECHNIQUE: Multiplanar, multiecho pulse sequences of the brain and surrounding structures were obtained without and with intravenous contrast. CONTRAST:  5mL GADAVIST GADOBUTROL 1 MMOL/ML IV SOLN  COMPARISON:  PET CT 05/19/2023. FINDINGS: Brain: Mild generalized parenchymal volume loss. Multifocal T2 FLAIR hyperintense signal abnormality within the cerebral white matter and pons, nonspecific but compatible with mild chronic small vessel ischemic disease. There is no acute infarct. No evidence of an intracranial mass. No extra-axial fluid collection. No midline shift. No pathologic intracranial enhancement identified. Vascular: Maintained flow voids within the proximal large arterial vessels. Skull and upper cervical spine: No focal worrisome marrow lesion. Incompletely assessed cervical spondylosis. Sinuses/Orbits: No mass or acute finding within the imaged orbits. Prior bilateral ocular lens replacement. Trace mucosal thickening within a left ethmoid air cell. IMPRESSION: 1. No evidence of intracranial metastatic disease. 2. Mild chronic small vessel ischemic changes within the cerebral white matter and pons. 3. Mild generalized parenchymal volume loss. Electronically Signed   By: Jackey Loge D.O.   On: 05/24/2023 11:55   NM PET Image Initial (PI) Skull Base To Thigh (  F-18 FDG)  Result Date: 05/23/2023 CLINICAL DATA:  Initial treatment strategy for pulmonary mass. EXAM: NUCLEAR MEDICINE PET SKULL BASE TO THIGH TECHNIQUE: 5.7 mCi F-18 FDG was injected intravenously. Full-ring PET imaging was performed from the skull base to thigh after the radiotracer. CT data was obtained and used for attenuation correction and anatomic localization. Fasting blood glucose: 112 mg/dl COMPARISON:  CT chest 4010 FINDINGS: NECK: No hypermetabolic lymph nodes in the neck. Incidental CT findings: None. CHEST: RIGHT lower lobe mass measures 5.8 x 4.7 cm and has intense metabolic activity with SUV max 13.3 (image 87). Hypermetabolic RIGHT infrahilar nodular thickening surrounding the proximal bronchus to the RIGHT lower lobe measures 2.4 cm (image 77) with SUV max 12.3 on image 81. Intensely hypermetabolic subcarinal lymph  node measures 2.7 cm with SUV max equal 11.0. No supraclavicular adenopathy.  No contralateral adenopathy. Previously described RIGHT upper lobe nodule has resolved. Previous described LEFT lower lobe nodule is  not hypermetabolic. Incidental CT findings: Centrilobular emphysema the upper lobes. Coronary artery calcification and aortic atherosclerotic calcification. ABDOMEN/PELVIS: No abnormal hypermetabolic activity within the liver, pancreas, adrenal glands, or spleen. No hypermetabolic lymph nodes in the abdomen or pelvis. Incidental CT findings: Single gallstone. Atherosclerotic calcification of the aorta. SKELETON: No aggressive osseous lesion.  LEFT hip prosthetic Incidental CT findings: None. IMPRESSION: 1. Intensely hypermetabolic RIGHT lower lobe mass consistent with primary bronchogenic carcinoma. 2. Hypermetabolic RIGHT infrahilar and subcarinal lymph nodes consistent with nodal metastasis. 3. No evidence distant metastatic disease. 4. Aortic Atherosclerosis (ICD10-I70.0) and Emphysema (ICD10-J43.9). Electronically Signed   By: Genevive Bi M.D.   On: 05/23/2023 15:21   DG Chest Port 1 View  Result Date: 05/17/2023 CLINICAL DATA:  Status post bronchoscopy with biopsy. EXAM: PORTABLE CHEST 1 VIEW COMPARISON:  March 09, 2023. FINDINGS: The heart size and mediastinal contours are within normal limits. Continued presence of large rounded mass seen in right lower lobe consistent with malignancy. No definite pneumothorax is noted. Elevated left hemidiaphragm is noted with minimal left basilar subsegmental atelectasis. The visualized skeletal structures are unremarkable. IMPRESSION: Continued presence of large rounded mass seen in right lower lobe consistent with malignancy. No definite pneumothorax is noted. Electronically Signed   By: Lupita Raider M.D.   On: 05/17/2023 16:33   DG C-ARM BRONCHOSCOPY  Result Date: 05/17/2023 C-ARM BRONCHOSCOPY: Fluoroscopy was utilized by the requesting  physician.  No radiographic interpretation.    Labs:  CBC: Recent Labs    05/05/23 1336  WBC 5.9  HGB 13.2  HCT 39.3  PLT 386    COAGS: No results for input(s): "INR", "APTT" in the last 8760 hours.  BMP: Recent Labs    05/05/23 1336  NA 137  K 4.2  CL 102  CO2 26  GLUCOSE 92  BUN 13  CALCIUM 9.6  CREATININE 0.75  GFRNONAA >60    LIVER FUNCTION TESTS: Recent Labs    05/05/23 1336  BILITOT 0.3  AST 22  ALT 19  ALKPHOS 68  PROT 8.6*  ALBUMIN 3.9    TUMOR MARKERS: No results for input(s): "AFPTM", "CEA", "CA199", "CHROMGRNA" in the last 8760 hours.  Assessment and Plan:  Patient was diagnosed with stage IIIb (T4, N2, M0) non-small cell lung cancer, squamous cell carcinoma.  The patient is expected to start the first dose of her treatment on June 06, 2023. For IV access and chemotherapies, Dr. Arbutus Ped requested a Port-A-Cath placement by IR. Patient presents for drain placement today  Risks and benefits of image  guided port-a-catheter placement was discussed with the patient including, but not limited to bleeding, infection, pneumothorax, or fibrin sheath development and need for additional procedures.  All of the patient's questions were answered, patient is agreeable to proceed. Consent signed and in chart.   Thank you for this interesting consult.  I greatly enjoyed meeting RIYAH TEIG and look forward to participating in their care.  A copy of this report was sent to the requesting provider on this date.  Electronically Signed: Sable Feil, PA-C/Kevin Clyda Smyth,PA-C 06/02/2023, 2:09 PM   I spent a total of 25 minutes   in face to face in clinical consultation, greater than 50% of which was counseling/coordinating care for Port-A-Cath placement.

## 2023-06-03 ENCOUNTER — Ambulatory Visit (HOSPITAL_COMMUNITY)
Admission: RE | Admit: 2023-06-03 | Discharge: 2023-06-03 | Disposition: A | Discharge status: Home / Self Care | Payer: BC Managed Care – PPO | Source: Ambulatory Visit | Attending: Internal Medicine | Admitting: Internal Medicine

## 2023-06-03 ENCOUNTER — Other Ambulatory Visit: Payer: Self-pay | Admitting: Urology

## 2023-06-03 ENCOUNTER — Encounter (HOSPITAL_COMMUNITY): Payer: Self-pay

## 2023-06-03 ENCOUNTER — Other Ambulatory Visit: Payer: Self-pay

## 2023-06-03 DIAGNOSIS — C3431 Malignant neoplasm of lower lobe, right bronchus or lung: Secondary | ICD-10-CM | POA: Diagnosis present

## 2023-06-03 DIAGNOSIS — F17210 Nicotine dependence, cigarettes, uncomplicated: Secondary | ICD-10-CM | POA: Diagnosis not present

## 2023-06-03 HISTORY — PX: IR IMAGING GUIDED PORT INSERTION: IMG5740

## 2023-06-03 MED ORDER — FENTANYL CITRATE (PF) 100 MCG/2ML IJ SOLN
INTRAMUSCULAR | Status: AC
  Filled 2023-06-03: qty 2

## 2023-06-03 MED ORDER — MIDAZOLAM HCL 2 MG/2ML IJ SOLN
INTRAMUSCULAR | Status: AC | PRN
  Administered 2023-06-03: .5 mg via INTRAVENOUS

## 2023-06-03 MED ORDER — HEPARIN SOD (PORK) LOCK FLUSH 100 UNIT/ML IV SOLN
INTRAVENOUS | Status: AC
  Filled 2023-06-03: qty 5

## 2023-06-03 MED ORDER — LIDOCAINE HCL 1 % IJ SOLN
20.0000 mL | Freq: Once | INTRAMUSCULAR | Status: AC
  Administered 2023-06-03: 9 mL via INTRADERMAL

## 2023-06-03 MED ORDER — FENTANYL CITRATE (PF) 100 MCG/2ML IJ SOLN
INTRAMUSCULAR | Status: AC | PRN
Start: 2023-06-03 — End: 2023-06-03
  Administered 2023-06-03: 25 ug via INTRAVENOUS

## 2023-06-03 MED ORDER — HEPARIN SOD (PORK) LOCK FLUSH 100 UNIT/ML IV SOLN
500.0000 [IU] | Freq: Once | INTRAVENOUS | Status: AC
  Administered 2023-06-03: 500 [IU] via INTRAVENOUS

## 2023-06-03 MED ORDER — LIDOCAINE-EPINEPHRINE 1 %-1:100000 IJ SOLN
20.0000 mL | Freq: Once | INTRAMUSCULAR | Status: AC
  Administered 2023-06-03: 6 mL via INTRADERMAL

## 2023-06-03 MED ORDER — SODIUM CHLORIDE 0.45 % IV SOLN: INTRAVENOUS | Status: AC

## 2023-06-03 MED ORDER — LIDOCAINE-EPINEPHRINE 1 %-1:100000 IJ SOLN
INTRAMUSCULAR | Status: AC
  Filled 2023-06-03: qty 1

## 2023-06-03 MED ORDER — LIDOCAINE HCL 1 % IJ SOLN
INTRAMUSCULAR | Status: AC
  Filled 2023-06-03: qty 20

## 2023-06-03 MED ORDER — SODIUM CHLORIDE 0.9 % IV SOLN: Freq: Once | INTRAVENOUS | Status: AC

## 2023-06-03 MED ORDER — MIDAZOLAM HCL 2 MG/2ML IJ SOLN
INTRAMUSCULAR | Status: AC
  Filled 2023-06-03: qty 2

## 2023-06-03 NOTE — Progress Notes (Unsigned)
[facility] OFFICE PROGRESS NOTE  [name], MD [address] [place] [address]  DIAGNOSIS: Stage IIIb (T4, N2, M0) non-small cell lung cancer, squamous cell carcinoma presented with large right lower lobe lung mass in addition to other right upper lobe pulmonary nodule and right hilar and mediastinal lymphadenopathy in September 2024.   PRIOR THERAPY: None  CURRENT THERAPY: A course of concurrent chemoradiation with weekly carboplatin for AUC of 2 and paclitaxel 45 Mg/M2. First dose June 06, 2023.   INTERVAL HISTORY: Kristy Salazar 82 y.o. female returns to clinic today for follow-up visit accompanied by ***.  The patient was recently diagnosed with lung cancer.  She is here for her first cycle of chemotherapy today.  She had her patient education class and she does not have any questions.  Today she is feeling ***.  She denies any fever, chills, night sweats, or unexplained weight loss.  Denies any chest pain, shortness of breath, cough, or mops this.  Denies any nausea, vomiting, diarrhea, or constipation.  Denies any headache or visual changes.  Denies any rashes or skin changes.  She met with radiation oncology and her last day radiation is scheduled for 07/25/22.  She is here today for evaluation repeat blood work before undergoing cycle #1.   MEDICAL HISTORY: Past Medical History:  Diagnosis Date   Arthritis    Asthma    COPD (chronic obstructive pulmonary disease) (HCC)    Diabetes mellitus    patient denies   Hypertension    Seasonal allergies     ALLERGIES:  is allergic to erythromycin.  MEDICATIONS:  Current Outpatient Medications  Medication Sig Dispense Refill   albuterol (VENTOLIN HFA) 108 (90 Base) MCG/ACT inhaler Inhale 2 puffs into the lungs every 6 (six) hours as needed for wheezing or shortness of breath. 8 g 2   Fluticasone-Umeclidin-Vilant 100-62.5-25 MCG/INH AEPB Inhale 2 application into the lungs.     furosemide (LASIX) 20 MG  tablet Take 1-2 tablets in the morning as needed for your foot swelling. 20 tablet 0   [START ON 06/05/2023] lidocaine-prilocaine (EMLA) cream Apply to affected area once 30 g 3   montelukast (SINGULAIR) 10 MG tablet Take 10 mg by mouth daily. For seasonal allergies     [START ON 06/05/2023] prochlorperazine (COMPAZINE) 10 MG tablet Take 1 tablet (10 mg total) by mouth every 6 (six) hours as needed for nausea or vomiting. 30 tablet 1   rosuvastatin (CRESTOR) 10 MG tablet Take 10 mg by mouth daily.       tiotropium (SPIRIVA) 18 MCG inhalation capsule Place 18 mcg into inhaler and inhale daily.     No current facility-administered medications for this visit.    SURGICAL HISTORY:  Past Surgical History:  Procedure Laterality Date   ABDOMINAL HYSTERECTOMY     APPENDECTOMY     BACK SURGERY     BRONCHIAL BIOPSY  05/17/2023   Procedure: BRONCHIAL BIOPSIES;  Surgeon: [name], MD;  Location: [facility] ENDOSCOPY;  Service: Pulmonary;;   BRONCHIAL BRUSHINGS  05/17/2023   Procedure: BRONCHIAL BRUSHINGS;  Surgeon: [name], MD;  Location: [facility] ENDOSCOPY;  Service: Pulmonary;;   BRONCHIAL NEEDLE ASPIRATION BIOPSY  05/17/2023   Procedure: BRONCHIAL NEEDLE ASPIRATION BIOPSIES;  Surgeon: [name], MD;  Location: [facility] ENDOSCOPY;  Service: Pulmonary;;   JOINT REPLACEMENT     TOTAL HIP ARTHROPLASTY     TOTAL KNEE ARTHROPLASTY     VIDEO BRONCHOSCOPY WITH ENDOBRONCHIAL ULTRASOUND Right 05/17/2023  Procedure: VIDEO BRONCHOSCOPY WITH ENDOBRONCHIAL ULTRASOUND;  Surgeon: [name], MD;  Location: [facility] ENDOSCOPY;  Service: Pulmonary;  Laterality: Right;    REVIEW OF SYSTEMS:   Review of Systems  Constitutional: Negative for appetite change, chills, fatigue, fever and unexpected weight change.  HENT:   Negative for mouth sores, nosebleeds, sore throat and trouble swallowing.   Eyes: Negative for eye problems and icterus.  Respiratory: Negative for cough, hemoptysis, shortness of breath and  wheezing.   Cardiovascular: Negative for chest pain and leg swelling.  Gastrointestinal: Negative for abdominal pain, constipation, diarrhea, nausea and vomiting.  Genitourinary: Negative for bladder incontinence, difficulty urinating, dysuria, frequency and hematuria.   Musculoskeletal: Negative for back pain, gait problem, neck pain and neck stiffness.  Skin: Negative for itching and rash.  Neurological: Negative for dizziness, extremity weakness, gait problem, headaches, light-headedness and seizures.  Hematological: Negative for adenopathy. Does not bruise/bleed easily.  Psychiatric/Behavioral: Negative for confusion, depression and sleep disturbance. The patient is not nervous/anxious.     PHYSICAL EXAMINATION:  There were no vitals taken for this visit.  ECOG PERFORMANCE STATUS: {CHL ONC ECOG [id number]  Physical Exam  Constitutional: Oriented to person, place, and time and well-developed, well-nourished, and in no distress. No distress.  HENT:  Head: Normocephalic and atraumatic.  Mouth/Throat: Oropharynx is clear and moist. No oropharyngeal exudate.  Eyes: Conjunctivae are normal. Right eye exhibits no discharge. Left eye exhibits no discharge. No scleral icterus.  Neck: Normal range of motion. Neck supple.  Cardiovascular: Normal rate, regular rhythm, normal heart sounds and intact distal pulses.   Pulmonary/Chest: Effort normal and breath sounds normal. No respiratory distress. No wheezes. No rales.  Abdominal: Soft. Bowel sounds are normal. Exhibits no distension and no mass. There is no tenderness.  Musculoskeletal: Normal range of motion. Exhibits no edema.  Lymphadenopathy:    No cervical adenopathy.  Neurological: Alert and oriented to person, place, and time. Exhibits normal muscle tone. Gait normal. Coordination normal.  Skin: Skin is warm and dry. No rash noted. Not diaphoretic. No erythema. No pallor.  Psychiatric: Mood, memory and judgment normal.  Vitals  reviewed.  LABORATORY DATA: Lab Results  Component Value Date   WBC 5.9 05/05/2023   HGB 13.2 05/05/2023   HCT 39.3 05/05/2023   MCV 91.4 05/05/2023   PLT 386 05/05/2023      Chemistry      Component Value Date/Time   NA 137 05/05/2023 1336   K 4.2 05/05/2023 1336   CL 102 05/05/2023 1336   CO2 26 05/05/2023 1336   BUN 13 05/05/2023 1336   CREATININE 0.75 05/05/2023 1336      Component Value Date/Time   CALCIUM 9.6 05/05/2023 1336   ALKPHOS 68 05/05/2023 1336   AST 22 05/05/2023 1336   ALT 19 05/05/2023 1336   BILITOT 0.3 05/05/2023 1336       RADIOGRAPHIC STUDIES:  MR BRAIN W WO CONTRAST  Result Date: 05/24/2023 CLINICAL DATA:  Provided history: Non-small cell lung cancer, staging. Malignant neoplasm of unspecified part of unspecified bronchus or lung. EXAM: MRI HEAD WITHOUT AND WITH CONTRAST TECHNIQUE: Multiplanar, multiecho pulse sequences of the brain and surrounding structures were obtained without and with intravenous contrast. CONTRAST:  5mL GADAVIST GADOBUTROL 1 MMOL/ML IV SOLN COMPARISON:  PET CT 05/19/2023. FINDINGS: Brain: Mild generalized parenchymal volume loss. Multifocal T2 FLAIR hyperintense signal abnormality within the cerebral white matter and pons, nonspecific but compatible with mild chronic small vessel ischemic disease. There is no acute infarct. No  evidence of an intracranial mass. No extra-axial fluid collection. No midline shift. No pathologic intracranial enhancement identified. Vascular: Maintained flow voids within the proximal large arterial vessels. Skull and upper cervical spine: No focal worrisome marrow lesion. Incompletely assessed cervical spondylosis. Sinuses/Orbits: No mass or acute finding within the imaged orbits. Prior bilateral ocular lens replacement. Trace mucosal thickening within a left ethmoid air cell. IMPRESSION: 1. No evidence of intracranial metastatic disease. 2. Mild chronic small vessel ischemic changes within the cerebral  white matter and pons. 3. Mild generalized parenchymal volume loss. Electronically Signed   By: Jackey Loge D.O.   On: 05/24/2023 11:55   NM PET Image Initial (PI) Skull Base To Thigh (F-18 FDG)  Result Date: 05/23/2023 CLINICAL DATA:  Initial treatment strategy for pulmonary mass. EXAM: NUCLEAR MEDICINE PET SKULL BASE TO THIGH TECHNIQUE: 5.7 mCi F-18 FDG was injected intravenously. Full-ring PET imaging was performed from the skull base to thigh after the radiotracer. CT data was obtained and used for attenuation correction and anatomic localization. Fasting blood glucose: 112 mg/dl COMPARISON:  CT chest 1610 FINDINGS: NECK: No hypermetabolic lymph nodes in the neck. Incidental CT findings: None. CHEST: RIGHT lower lobe mass measures 5.8 x 4.7 cm and has intense metabolic activity with SUV max 13.3 (image 87). Hypermetabolic RIGHT infrahilar nodular thickening surrounding the proximal bronchus to the RIGHT lower lobe measures 2.4 cm (image 77) with SUV max 12.3 on image 81. Intensely hypermetabolic subcarinal lymph node measures 2.7 cm with SUV max equal 11.0. No supraclavicular adenopathy.  No contralateral adenopathy. Previously described RIGHT upper lobe nodule has resolved. Previous described LEFT lower lobe nodule is  not hypermetabolic. Incidental CT findings: Centrilobular emphysema the upper lobes. Coronary artery calcification and aortic atherosclerotic calcification. ABDOMEN/PELVIS: No abnormal hypermetabolic activity within the liver, pancreas, adrenal glands, or spleen. No hypermetabolic lymph nodes in the abdomen or pelvis. Incidental CT findings: Single gallstone. Atherosclerotic calcification of the aorta. SKELETON: No aggressive osseous lesion.  LEFT hip prosthetic Incidental CT findings: None. IMPRESSION: 1. Intensely hypermetabolic RIGHT lower lobe mass consistent with primary bronchogenic carcinoma. 2. Hypermetabolic RIGHT infrahilar and subcarinal lymph nodes consistent with nodal  metastasis. 3. No evidence distant metastatic disease. 4. Aortic Atherosclerosis (ICD10-I70.0) and Emphysema (ICD10-J43.9). Electronically Signed   By: Genevive Bi M.D.   On: 05/23/2023 15:21   DG Chest Port 1 View  Result Date: 05/17/2023 CLINICAL DATA:  Status post bronchoscopy with biopsy. EXAM: PORTABLE CHEST 1 VIEW COMPARISON:  March 09, 2023. FINDINGS: The heart size and mediastinal contours are within normal limits. Continued presence of large rounded mass seen in right lower lobe consistent with malignancy. No definite pneumothorax is noted. Elevated left hemidiaphragm is noted with minimal left basilar subsegmental atelectasis. The visualized skeletal structures are unremarkable. IMPRESSION: Continued presence of large rounded mass seen in right lower lobe consistent with malignancy. No definite pneumothorax is noted. Electronically Signed   By: Lupita Raider M.D.   On: 05/17/2023 16:33   DG C-ARM BRONCHOSCOPY  Result Date: 05/17/2023 C-ARM BRONCHOSCOPY: Fluoroscopy was utilized by the requesting physician.  No radiographic interpretation.     ASSESSMENT/PLAN:  This is a very pleasant 82 year old African-American female with stage IIIb (T4, N2, M0) non-small cell lung cancer, squamous cell carcinoma.  She presented with a large right lower lobe lung mass in addition to other right upper lobe pulmonary nodules and right hilar mediastinal lymphadenopathy.  She was diagnosed in September 2024.  Her PD-L1 expression is ***. He had a port-a-cath placed  last week and tolerated this well.   She is scheduled to start concurrent chemoradiation today with weekly carboplatin for an AUC of 2 paclitaxel 45 mg/m.  Labs were reviewed.  Recommend that she***cycle #1 today scheduled.  We will see her back for follow-up visit in 2 weeks for evaluation repeat blood work before undergoing cycle #3.  The patient was advised to call immediately if she has any concerning symptoms in the interval. The  patient voices understanding of current disease status and treatment options and is in agreement with the current care plan. All questions were answered. The patient knows to call the clinic with any problems, questions or concerns. We can certainly see the patient much sooner if necessary   No orders of the defined types were placed in this encounter.    I spent {CHL ONC TIME VISIT - WUJWJ:[id number]} counseling the patient face to face. The total time spent in the appointment was {CHL ONC TIME VISIT - OZHYQ:[id number]}.  Tomekia Helton L Ronie Fleeger, PA-C 06/03/23

## 2023-06-03 NOTE — Discharge Instructions (Addendum)
Please call Interventional Radiology clinic [phone] with any questions or concerns.  You may remove your dressing and shower tomorrow.  Replace old dressing with new bandaid if needed.  Keep site clean and dry.  DO NOT use EMLA cream on your port site for 2 weeks (or until site is healed) as this cream will remove surgical glue on your incision.  Your provider should assure your port is flushed on a monthly basis.  Implanted Port Insertion, Care After This sheet gives you information about how to care for yourself after your procedure. Your health care provider may also give you more specific instructions. If you have problems or questions, contact your health care provider. What can I expect after the procedure? After the procedure, it is common to have: Discomfort at the port insertion site. Bruising on the skin over the port. This should improve over 3-4 days. Follow these instructions at home: [facility] care After your port is placed, you will get a manufacturer's information card. The card has information about your port. Keep this card with you at all times. Take care of the port as told by your health care provider. Ask your health care provider if you or a family member can get training for taking care of the port at home. A home health care nurse may also take care of the port. Make sure to remember what type of port you have. Incision care Follow instructions from your health care provider about how to take care of your port insertion site. Make sure you: Wash your hands with soap and water before and after you change your bandage (dressing). If soap and water are not available, use hand sanitizer. Change your dressing as told by your health care provider. Leave stitches (sutures), skin glue, or adhesive strips in place. These skin closures may need to stay in place for 2 weeks or longer. If adhesive strip edges start to loosen and curl up, you may trim the loose edges. Do not remove  adhesive strips completely unless your health care provider tells you to do that. Check your port insertion site every day for signs of infection. Check for: Redness, swelling, or pain. Fluid or blood. Warmth. Pus or a bad smell.        Activity Return to your normal activities as told by your health care provider. Ask your health care provider what activities are safe for you. Do not lift anything that is heavier than 10 lb (4.5 kg), or the limit that you are told, until your health care provider says that it is safe. General instructions Take over-the-counter and prescription medicines only as told by your health care provider. Do not take baths, swim, or use a hot tub until your health care provider approves. Ask your health care provider if you may take showers. You may only be allowed to take sponge baths. Do not drive for 24 hours if you were given a sedative during your procedure. Wear a medical alert bracelet in case of an emergency. This will tell any health care providers that you have a port. Keep all follow-up visits as told by your health care provider. This is important. Contact a health care provider if: You cannot flush your port with saline as directed, or you cannot draw blood from the port. You have a fever or chills. You have redness, swelling, or pain around your port insertion site. You have fluid or blood coming from your port insertion site. Your port insertion site feels warm  to the touch. You have pus or a bad smell coming from the port insertion site. Get help right away if: You have chest pain or shortness of breath. You have bleeding from your port that you cannot control. Summary Take care of the port as told by your health care provider. Keep the manufacturer's information card with you at all times. Change your dressing as told by your health care provider. Contact a health care provider if you have a fever or chills or if you have redness, swelling, or  pain around your port insertion site. Keep all follow-up visits as told by your health care provider. This information is not intended to replace advice given to you by your health care provider. Make sure you discuss any questions you have with your health care provider. Document Revised: 01/24/2018 Document Reviewed: 01/24/2018 Elsevier Patient Education  2021 [employer] Inc.   Moderate Conscious Sedation, Adult, Care After This sheet gives you information about how to care for yourself after your procedure. Your health care provider may also give you more specific instructions. If you have problems or questions, contact your health care provider. What can I expect after the procedure? After the procedure, it is common to have: Sleepiness for several hours. Impaired judgment for several hours. Difficulty with balance. Vomiting if you eat too soon. Follow these instructions at home: For the time period you were told by your health care provider: Rest. Do not participate in activities where you could fall or become injured. Do not drive or use machinery. Do not drink alcohol. Do not take sleeping pills or medicines that cause drowsiness. Do not make important decisions or sign legal documents. Do not take care of children on your own.        Eating and drinking Follow the diet recommended by your health care provider. Drink enough fluid to keep your urine pale yellow. If you vomit: Drink water, juice, or soup when you can drink without vomiting. Make sure you have little or no nausea before eating solid foods.    General instructions Take over-the-counter and prescription medicines only as told by your health care provider. Have a responsible adult stay with you for the time you are told. It is important to have someone help care for you until you are awake and alert. Do not smoke. Keep all follow-up visits as told by your health care provider. This is important. Contact a  health care provider if: You are still sleepy or having trouble with balance after 24 hours. You feel light-headed. You keep feeling nauseous or you keep vomiting. You develop a rash. You have a fever. You have redness or swelling around the IV site. Get help right away if: You have trouble breathing. You have new-onset confusion at home. Summary After the procedure, it is common to feel sleepy, have impaired judgment, or feel nauseous if you eat too soon. Rest after you get home. Know the things you should not do after the procedure. Follow the diet recommended by your health care provider and drink enough fluid to keep your urine pale yellow. Get help right away if you have trouble breathing or new-onset confusion at home. This information is not intended to replace advice given to you by your health care provider. Make sure you discuss any questions you have with your health care provider. Document Revised: 10/26/2019 Document Reviewed: 05/24/2019 Elsevier Patient Education  2021 [employer].

## 2023-06-03 NOTE — Procedures (Signed)
Interventional Radiology Procedure:   Indications: Lung cancer  Procedure: Port placement  Findings: Right jugular port, tip at SVC/RA junction  Complications: None     EBL: Minimal, less than 10 ml  Plan: Discharge in one hour.  Keep port site and incisions dry for at least 24 hours.     Shavy Beachem R. Lowella Dandy, MD  Pager: [phone]

## 2023-06-06 ENCOUNTER — Inpatient Hospital Stay: Payer: BC Managed Care – PPO

## 2023-06-06 ENCOUNTER — Encounter (HOSPITAL_COMMUNITY): Payer: Self-pay

## 2023-06-06 ENCOUNTER — Inpatient Hospital Stay (HOSPITAL_BASED_OUTPATIENT_CLINIC_OR_DEPARTMENT_OTHER): Payer: BC Managed Care – PPO | Admitting: Physician Assistant

## 2023-06-06 ENCOUNTER — Ambulatory Visit
Admission: RE | Admit: 2023-06-06 | Discharge: 2023-06-06 | Disposition: A | Discharge status: Home / Self Care | Payer: BC Managed Care – PPO | Source: Ambulatory Visit | Attending: Radiation Oncology | Admitting: Radiation Oncology

## 2023-06-06 ENCOUNTER — Other Ambulatory Visit: Payer: Self-pay

## 2023-06-06 ENCOUNTER — Encounter: Payer: Self-pay | Admitting: Internal Medicine

## 2023-06-06 VITALS — BP 111/67 | HR 90 | Temp 98.2°F | Resp 16 | Wt 112.5 lb

## 2023-06-06 VITALS — BP 116/79 | HR 71 | Temp 97.5°F | Resp 17

## 2023-06-06 DIAGNOSIS — C3431 Malignant neoplasm of lower lobe, right bronchus or lung: Secondary | ICD-10-CM

## 2023-06-06 DIAGNOSIS — Z5111 Encounter for antineoplastic chemotherapy: Secondary | ICD-10-CM | POA: Insufficient documentation

## 2023-06-06 LAB — CBC WITH DIFFERENTIAL (CANCER CENTER ONLY)
Abs Immature Granulocytes: 0.01 10*3/uL (ref 0.00–0.07)
Basophils Absolute: 0 10*3/uL (ref 0.0–0.1)
Basophils Relative: 1 %
Eosinophils Absolute: 0.1 10*3/uL (ref 0.0–0.5)
Eosinophils Relative: 1 %
HCT: 38.3 % (ref 36.0–46.0)
Hemoglobin: 13.1 g/dL (ref 12.0–15.0)
Immature Granulocytes: 0 %
Lymphocytes Relative: 31 %
Lymphs Abs: 2 10*3/uL (ref 0.7–4.0)
MCH: 30.8 pg (ref 26.0–34.0)
MCHC: 34.2 g/dL (ref 30.0–36.0)
MCV: 89.9 fL (ref 80.0–100.0)
Monocytes Absolute: 0.6 10*3/uL (ref 0.1–1.0)
Monocytes Relative: 10 %
Neutro Abs: 3.6 10*3/uL (ref 1.7–7.7)
Neutrophils Relative %: 57 %
Platelet Count: 396 10*3/uL (ref 150–400)
RBC: 4.26 MIL/uL (ref 3.87–5.11)
RDW: 13.3 % (ref 11.5–15.5)
WBC Count: 6.3 10*3/uL (ref 4.0–10.5)
nRBC: 0 % (ref 0.0–0.2)

## 2023-06-06 LAB — RAD ONC ARIA SESSION SUMMARY
Course Elapsed Days: 0
Plan Fractions Treated to Date: 1
Plan Prescribed Dose Per Fraction: 2 Gy
Plan Total Fractions Prescribed: 30
Plan Total Prescribed Dose: 60 Gy
Reference Point Dosage Given to Date: 2 Gy
Reference Point Session Dosage Given: 2 Gy
Session Number: 1

## 2023-06-06 LAB — CMP (CANCER CENTER ONLY)
ALT: 16 U/L (ref 0–44)
AST: 21 U/L (ref 15–41)
Albumin: 3.8 g/dL (ref 3.5–5.0)
Alkaline Phosphatase: 62 U/L (ref 38–126)
Anion gap: 6 (ref 5–15)
BUN: 14 mg/dL (ref 8–23)
CO2: 27 mmol/L (ref 22–32)
Calcium: 9.7 mg/dL (ref 8.9–10.3)
Chloride: 103 mmol/L (ref 98–111)
Creatinine: 0.67 mg/dL (ref 0.44–1.00)
GFR, Estimated: >60 mL/min (ref >60)
Glucose, Bld: 106 mg/dL — ABNORMAL HIGH (ref 70–99)
Potassium: 4.1 mmol/L (ref 3.5–5.1)
Sodium: 136 mmol/L (ref 135–145)
Total Bilirubin: 0.3 mg/dL (ref <1.2)
Total Protein: 8.2 g/dL — ABNORMAL HIGH (ref 6.5–8.1)

## 2023-06-06 MED ORDER — SODIUM CHLORIDE 0.9 % IV SOLN: INTRAVENOUS | Status: DC

## 2023-06-06 MED ORDER — SODIUM CHLORIDE 0.9 % IV SOLN
45.0000 mg/m2 | Freq: Once | INTRAVENOUS | Status: AC
Start: 2023-06-06 — End: 2023-06-06
  Administered 2023-06-06: 72 mg via INTRAVENOUS
  Filled 2023-06-06: qty 12

## 2023-06-06 MED ORDER — FAMOTIDINE IN NACL 20-0.9 MG/50ML-% IV SOLN
20.0000 mg | Freq: Once | INTRAVENOUS | Status: AC
  Administered 2023-06-06: 20 mg via INTRAVENOUS
  Filled 2023-06-06: qty 50

## 2023-06-06 MED ORDER — CETIRIZINE HCL 10 MG/ML IV SOLN
10.0000 mg | Freq: Once | INTRAVENOUS | Status: AC
  Administered 2023-06-06: 10 mg via INTRAVENOUS
  Filled 2023-06-06: qty 1

## 2023-06-06 MED ORDER — PALONOSETRON HCL INJECTION 0.25 MG/5ML
0.2500 mg | Freq: Once | INTRAVENOUS | Status: AC
  Administered 2023-06-06: 0.25 mg via INTRAVENOUS
  Filled 2023-06-06: qty 5

## 2023-06-06 MED ORDER — SODIUM CHLORIDE 0.9 % IV SOLN
120.0000 mg | Freq: Once | INTRAVENOUS | Status: AC
  Administered 2023-06-06: 120 mg via INTRAVENOUS
  Filled 2023-06-06: qty 12

## 2023-06-06 MED ORDER — DEXAMETHASONE SODIUM PHOSPHATE 10 MG/ML IJ SOLN
10.0000 mg | Freq: Once | INTRAMUSCULAR | Status: AC
  Administered 2023-06-06: 10 mg via INTRAVENOUS
  Filled 2023-06-06: qty 1

## 2023-06-06 NOTE — Patient Instructions (Signed)
[facility] - A DEPT OF [facility]  Discharge Instructions: Thank you for choosing [facility] to provide your oncology and hematology care.   If you have a lab appointment with the Cancer Center, please go directly to the Cancer Center and check in at the registration area.   Wear comfortable clothing and clothing appropriate for easy access to any Portacath or PICC line.   We strive to give you quality time with your provider. You may need to reschedule your appointment if you arrive late (15 or more minutes).  Arriving late affects you and other patients whose appointments are after yours.  Also, if you miss three or more appointments without notifying the office, you may be dismissed from the clinic at the provider's discretion.      For prescription refill requests, have your pharmacy contact our office and allow 72 hours for refills to be completed.    Today you received the following chemotherapy and/or immunotherapy agents Taxol, Carboplatin    To help prevent nausea and vomiting after your treatment, we encourage you to take your nausea medication as directed.  BELOW ARE SYMPTOMS THAT SHOULD BE REPORTED IMMEDIATELY: *FEVER GREATER THAN 100.4 F (38 C) OR HIGHER *CHILLS OR SWEATING *NAUSEA AND VOMITING THAT IS NOT CONTROLLED WITH YOUR NAUSEA MEDICATION *UNUSUAL SHORTNESS OF BREATH *UNUSUAL BRUISING OR BLEEDING *URINARY PROBLEMS (pain or burning when urinating, or frequent urination) *BOWEL PROBLEMS (unusual diarrhea, constipation, pain near the anus) TENDERNESS IN MOUTH AND THROAT WITH OR WITHOUT PRESENCE OF ULCERS (sore throat, sores in mouth, or a toothache) UNUSUAL RASH, SWELLING OR PAIN  UNUSUAL VAGINAL DISCHARGE OR ITCHING   Items with * indicate a potential emergency and should be followed up as soon as possible or go to the Emergency Department if any problems should occur.  Please show the CHEMOTHERAPY ALERT CARD or  IMMUNOTHERAPY ALERT CARD at check-in to the Emergency Department and triage nurse.  Should you have questions after your visit or need to cancel or reschedule your appointment, please contact [facility] - A DEPT OF [facility]  Dept: [phone]  and follow the prompts.  Office hours are 8:00 a.m. to 4:30 p.m. Monday - Friday. Please note that voicemails left after 4:00 p.m. may not be returned until the following business day.  We are closed weekends and major holidays. You have access to a nurse at all times for urgent questions. Please call the main number to the clinic Dept: [phone] and follow the prompts.   For any non-urgent questions, you may also contact your provider using MyChart. We now offer e-Visits for anyone 21 and older to request care online for non-urgent symptoms. For details visit [email or web address].   Also download the MyChart app! Go to the app store, search "MyChart", open the app, select [place], and log in with your MyChart username and password.

## 2023-06-07 ENCOUNTER — Encounter: Payer: Self-pay | Admitting: Internal Medicine

## 2023-06-07 ENCOUNTER — Telehealth: Payer: Self-pay

## 2023-06-07 ENCOUNTER — Other Ambulatory Visit: Payer: Self-pay

## 2023-06-07 ENCOUNTER — Ambulatory Visit
Admission: RE | Admit: 2023-06-07 | Discharge: 2023-06-07 | Disposition: A | Discharge status: Home / Self Care | Payer: BC Managed Care – PPO | Source: Ambulatory Visit | Attending: Radiation Oncology | Admitting: Radiation Oncology

## 2023-06-07 DIAGNOSIS — C3431 Malignant neoplasm of lower lobe, right bronchus or lung: Secondary | ICD-10-CM | POA: Diagnosis not present

## 2023-06-07 LAB — RAD ONC ARIA SESSION SUMMARY
Course Elapsed Days: 1
Plan Fractions Treated to Date: 2
Plan Prescribed Dose Per Fraction: 2 Gy
Plan Total Fractions Prescribed: 30
Plan Total Prescribed Dose: 60 Gy
Reference Point Dosage Given to Date: 4 Gy
Reference Point Session Dosage Given: 2 Gy
Session Number: 2

## 2023-06-07 NOTE — Telephone Encounter (Signed)
Notified Patient of completion of FMLA forms for Self and for Daughter. Forms placed for pick-up as requested. No other needs or concerns voiced at this time.

## 2023-06-07 NOTE — Telephone Encounter (Signed)
-----   Message from Nurse Barbara Cower E sent at 06/06/2023  3:16 PM EST ----- Regarding: first time treatment call back- Kristy Salazar- Taxol/Carbo Patient received first time treatment today. She is followed by Dr Arbutus Ped. She received taxol [place] today. Did well with no issues. :)

## 2023-06-07 NOTE — Telephone Encounter (Signed)
Called pt to see how she did with her treatment & she reports doing well & denies any problems.  She slept well last night & no n/v, diarrhea/constipation & is eating well.  She is getting ready to come for radiation.  She knows her next appts & how to reach [place] if needed.

## 2023-06-08 ENCOUNTER — Encounter: Payer: Self-pay | Admitting: Internal Medicine

## 2023-06-08 ENCOUNTER — Ambulatory Visit
Admission: RE | Admit: 2023-06-08 | Discharge: 2023-06-08 | Disposition: A | Discharge status: Home / Self Care | Payer: BC Managed Care – PPO | Source: Ambulatory Visit | Attending: Radiation Oncology | Admitting: Radiation Oncology

## 2023-06-08 ENCOUNTER — Other Ambulatory Visit: Payer: Self-pay

## 2023-06-08 DIAGNOSIS — C3490 Malignant neoplasm of unspecified part of unspecified bronchus or lung: Secondary | ICD-10-CM | POA: Diagnosis not present

## 2023-06-08 DIAGNOSIS — C3431 Malignant neoplasm of lower lobe, right bronchus or lung: Secondary | ICD-10-CM | POA: Diagnosis not present

## 2023-06-08 LAB — RAD ONC ARIA SESSION SUMMARY
Course Elapsed Days: 2
Plan Fractions Treated to Date: 3
Plan Prescribed Dose Per Fraction: 2 Gy
Plan Total Fractions Prescribed: 30
Plan Total Prescribed Dose: 60 Gy
Reference Point Dosage Given to Date: 6 Gy
Reference Point Session Dosage Given: 2 Gy
Session Number: 3

## 2023-06-13 ENCOUNTER — Other Ambulatory Visit: Payer: Self-pay

## 2023-06-13 ENCOUNTER — Encounter (HOSPITAL_COMMUNITY): Payer: Self-pay

## 2023-06-13 ENCOUNTER — Ambulatory Visit: Payer: BC Managed Care – PPO | Admitting: Nurse Practitioner

## 2023-06-13 ENCOUNTER — Ambulatory Visit
Admission: RE | Admit: 2023-06-13 | Discharge: 2023-06-13 | Disposition: A | Discharge status: Home / Self Care | Payer: BC Managed Care – PPO | Source: Ambulatory Visit | Attending: Radiation Oncology | Admitting: Radiation Oncology

## 2023-06-13 ENCOUNTER — Other Ambulatory Visit: Payer: BC Managed Care – PPO

## 2023-06-13 DIAGNOSIS — C3431 Malignant neoplasm of lower lobe, right bronchus or lung: Secondary | ICD-10-CM | POA: Insufficient documentation

## 2023-06-13 LAB — RAD ONC ARIA SESSION SUMMARY
Course Elapsed Days: 7
Plan Fractions Treated to Date: 4
Plan Prescribed Dose Per Fraction: 2 Gy
Plan Total Fractions Prescribed: 30
Plan Total Prescribed Dose: 60 Gy
Reference Point Dosage Given to Date: 8 Gy
Reference Point Session Dosage Given: 2 Gy
Session Number: 4

## 2023-06-13 MED ORDER — SONAFINE EX EMUL
1.0000 | Freq: Once | CUTANEOUS | Status: AC
  Administered 2023-06-13: 1 via TOPICAL

## 2023-06-14 ENCOUNTER — Ambulatory Visit
Admission: RE | Admit: 2023-06-14 | Discharge: 2023-06-14 | Disposition: A | Discharge status: Home / Self Care | Payer: BC Managed Care – PPO | Source: Ambulatory Visit | Attending: Radiation Oncology | Admitting: Radiation Oncology

## 2023-06-14 ENCOUNTER — Other Ambulatory Visit: Payer: Self-pay

## 2023-06-14 DIAGNOSIS — C3431 Malignant neoplasm of lower lobe, right bronchus or lung: Secondary | ICD-10-CM | POA: Diagnosis not present

## 2023-06-14 LAB — RAD ONC ARIA SESSION SUMMARY
Course Elapsed Days: 8
Plan Fractions Treated to Date: 5
Plan Prescribed Dose Per Fraction: 2 Gy
Plan Total Fractions Prescribed: 30
Plan Total Prescribed Dose: 60 Gy
Reference Point Dosage Given to Date: 10 Gy
Reference Point Session Dosage Given: 2 Gy
Session Number: 5

## 2023-06-15 ENCOUNTER — Other Ambulatory Visit: Payer: Self-pay

## 2023-06-15 ENCOUNTER — Ambulatory Visit
Admission: RE | Admit: 2023-06-15 | Discharge: 2023-06-15 | Disposition: A | Discharge status: Home / Self Care | Payer: BC Managed Care – PPO | Source: Ambulatory Visit | Attending: Radiation Oncology | Admitting: Radiation Oncology

## 2023-06-15 ENCOUNTER — Inpatient Hospital Stay: Payer: BC Managed Care – PPO

## 2023-06-15 VITALS — BP 110/64 | HR 85 | Temp 97.8°F | Resp 16

## 2023-06-15 DIAGNOSIS — Z95828 Presence of other vascular implants and grafts: Secondary | ICD-10-CM | POA: Insufficient documentation

## 2023-06-15 DIAGNOSIS — R0789 Other chest pain: Secondary | ICD-10-CM | POA: Insufficient documentation

## 2023-06-15 DIAGNOSIS — K208 Other esophagitis without bleeding: Secondary | ICD-10-CM | POA: Insufficient documentation

## 2023-06-15 DIAGNOSIS — C3431 Malignant neoplasm of lower lobe, right bronchus or lung: Secondary | ICD-10-CM

## 2023-06-15 DIAGNOSIS — Z5111 Encounter for antineoplastic chemotherapy: Secondary | ICD-10-CM | POA: Insufficient documentation

## 2023-06-15 LAB — CMP (CANCER CENTER ONLY)
ALT: 16 U/L (ref 0–44)
AST: 18 U/L (ref 15–41)
Albumin: 3.5 g/dL (ref 3.5–5.0)
Alkaline Phosphatase: 62 U/L (ref 38–126)
Anion gap: 5 (ref 5–15)
BUN: 12 mg/dL (ref 8–23)
CO2: 28 mmol/L (ref 22–32)
Calcium: 9.2 mg/dL (ref 8.9–10.3)
Chloride: 103 mmol/L (ref 98–111)
Creatinine: 0.55 mg/dL (ref 0.44–1.00)
GFR, Estimated: >60 mL/min (ref >60)
Glucose, Bld: 101 mg/dL — ABNORMAL HIGH (ref 70–99)
Potassium: 4 mmol/L (ref 3.5–5.1)
Sodium: 136 mmol/L (ref 135–145)
Total Bilirubin: 0.3 mg/dL (ref <1.2)
Total Protein: 7.3 g/dL (ref 6.5–8.1)

## 2023-06-15 LAB — CBC WITH DIFFERENTIAL (CANCER CENTER ONLY)
Abs Immature Granulocytes: 0.02 10*3/uL (ref 0.00–0.07)
Basophils Absolute: 0 10*3/uL (ref 0.0–0.1)
Basophils Relative: 0 %
Eosinophils Absolute: 0.1 10*3/uL (ref 0.0–0.5)
Eosinophils Relative: 1 %
HCT: 37 % (ref 36.0–46.0)
Hemoglobin: 12.3 g/dL (ref 12.0–15.0)
Immature Granulocytes: 0 %
Lymphocytes Relative: 18 %
Lymphs Abs: 1 10*3/uL (ref 0.7–4.0)
MCH: 30 pg (ref 26.0–34.0)
MCHC: 33.2 g/dL (ref 30.0–36.0)
MCV: 90.2 fL (ref 80.0–100.0)
Monocytes Absolute: 0.7 10*3/uL (ref 0.1–1.0)
Monocytes Relative: 12 %
Neutro Abs: 3.8 10*3/uL (ref 1.7–7.7)
Neutrophils Relative %: 69 %
Platelet Count: 357 10*3/uL (ref 150–400)
RBC: 4.1 MIL/uL (ref 3.87–5.11)
RDW: 13.5 % (ref 11.5–15.5)
WBC Count: 5.6 10*3/uL (ref 4.0–10.5)
nRBC: 0 % (ref 0.0–0.2)

## 2023-06-15 LAB — RAD ONC ARIA SESSION SUMMARY
Course Elapsed Days: 9
Plan Fractions Treated to Date: 6
Plan Prescribed Dose Per Fraction: 2 Gy
Plan Total Fractions Prescribed: 30
Plan Total Prescribed Dose: 60 Gy
Reference Point Dosage Given to Date: 12 Gy
Reference Point Session Dosage Given: 2 Gy
Session Number: 6

## 2023-06-15 MED ORDER — HEPARIN SOD (PORK) LOCK FLUSH 100 UNIT/ML IV SOLN
500.0000 [IU] | Freq: Once | INTRAVENOUS | Status: AC | PRN
  Administered 2023-06-15: 500 [IU]

## 2023-06-15 MED ORDER — DEXAMETHASONE SODIUM PHOSPHATE 10 MG/ML IJ SOLN
10.0000 mg | Freq: Once | INTRAMUSCULAR | Status: AC
Start: 2023-06-15 — End: 2023-06-15
  Administered 2023-06-15: 10 mg via INTRAVENOUS
  Filled 2023-06-15: qty 1

## 2023-06-15 MED ORDER — SODIUM CHLORIDE 0.9 % IV SOLN
124.4000 mg | Freq: Once | INTRAVENOUS | Status: AC
  Administered 2023-06-15: 120 mg via INTRAVENOUS
  Filled 2023-06-15: qty 12

## 2023-06-15 MED ORDER — SODIUM CHLORIDE 0.9% FLUSH
10.0000 mL | INTRAVENOUS | Status: DC | PRN
  Administered 2023-06-15: 10 mL

## 2023-06-15 MED ORDER — SODIUM CHLORIDE 0.9% FLUSH
10.0000 mL | Freq: Once | INTRAVENOUS | Status: AC
  Administered 2023-06-15: 10 mL

## 2023-06-15 MED ORDER — SODIUM CHLORIDE 0.9 % IV SOLN: INTRAVENOUS | Status: DC

## 2023-06-15 MED ORDER — FAMOTIDINE IN NACL 20-0.9 MG/50ML-% IV SOLN
20.0000 mg | Freq: Once | INTRAVENOUS | Status: AC
  Administered 2023-06-15: 20 mg via INTRAVENOUS
  Filled 2023-06-15: qty 50

## 2023-06-15 MED ORDER — SODIUM CHLORIDE 0.9 % IV SOLN
45.0000 mg/m2 | Freq: Once | INTRAVENOUS | Status: AC
  Administered 2023-06-15: 72 mg via INTRAVENOUS
  Filled 2023-06-15: qty 12

## 2023-06-15 MED ORDER — CETIRIZINE HCL 10 MG/ML IV SOLN
10.0000 mg | Freq: Once | INTRAVENOUS | Status: AC
  Administered 2023-06-15: 10 mg via INTRAVENOUS
  Filled 2023-06-15: qty 1

## 2023-06-15 MED ORDER — PALONOSETRON HCL INJECTION 0.25 MG/5ML
0.2500 mg | Freq: Once | INTRAVENOUS | Status: AC
  Administered 2023-06-15: 0.25 mg via INTRAVENOUS
  Filled 2023-06-15: qty 5

## 2023-06-15 NOTE — Patient Instructions (Signed)
[facility] - [facility] DE[facility]  Discharge Instructions: Thank you for choosing [facility] to provide your oncology and hematology care.   If you have a lab appointment with the Cancer Center, please go directly to the Cancer Center and check in at the registration area.   Wear comfortable clothing and clothing appropriate for easy access to any Portacath or PICC line.   We strive to give you quality time with your provider. You may need to reschedule your appointment if you arrive late (15 or more minutes).  Arriving late affects you and other patients whose appointments are after yours.  Also, if you miss three or more appointments without notifying the office, you may be dismissed from the clinic at the provider's discretion.      For prescription refill requests, have your pharmacy contact our office and allow 72 hours for refills to be completed.    Today you received the following chemotherapy and/or immunotherapy agents: Taxol, Carboplatin      To help prevent nausea and vomiting after your treatment, we encourage you to take your nausea medication as directed.  BELOW ARE SYMPTOMS THAT SHOULD BE REPORTED IMMEDIATELY: *FEVER GREATER THAN 100.4 F (38 C) OR HIGHER *CHILLS OR SWEATING *NAUSEA AND VOMITING THAT IS NOT CONTROLLED WITH YOUR NAUSEA MEDICATION *UNUSUAL SHORTNESS OF BREATH *UNUSUAL BRUISING OR BLEEDING *URINARY PROBLEMS (pain or burning when urinating, or frequent urination) *BOWEL PROBLEMS (unusual diarrhea, constipation, pain near the anus) TENDERNESS IN MOUTH AND THROAT WITH OR WITHOUT PRESENCE OF ULCERS (sore throat, sores in mouth, or a toothache) UNUSUAL RASH, SWELLING OR PAIN  UNUSUAL VAGINAL DISCHARGE OR ITCHING   Items with * indicate a potential emergency and should be followed up as soon as possible or go to the Emergency Department if any problems should occur.  Please show the CHEMOTHERAPY ALERT CARD or  IMMUNOTHERAPY ALERT CARD at check-in to the Emergency Department and triage nurse.  Should you have questions after your visit or need to cancel or reschedule your appointment, please contact [facility] - A DEPT OF [facility]  Dept: [phone]  and follow the prompts.  Office hours are 8:00 a.m. to 4:30 p.m. Monday - Friday. Please note that voicemails left after 4:00 p.m. may not be returned until the following business day.  We are closed weekends and major holidays. You have access to a nurse at all times for urgent questions. Please call the main number to the clinic Dept: [phone] and follow the prompts.   For any non-urgent questions, you may also contact your provider using MyChart. We now offer e-Visits for anyone 54 and older to request care online for non-urgent symptoms. For details visit [email or web address].   Also download the MyChart app! Go to the app store, search "MyChart", open the app, select [place], and log in with your MyChart username and password.

## 2023-06-16 ENCOUNTER — Other Ambulatory Visit: Payer: Self-pay

## 2023-06-16 ENCOUNTER — Ambulatory Visit
Admission: RE | Admit: 2023-06-16 | Discharge: 2023-06-16 | Disposition: A | Discharge status: Home / Self Care | Payer: BC Managed Care – PPO | Source: Ambulatory Visit | Attending: Radiation Oncology | Admitting: Radiation Oncology

## 2023-06-16 DIAGNOSIS — C3431 Malignant neoplasm of lower lobe, right bronchus or lung: Secondary | ICD-10-CM | POA: Diagnosis not present

## 2023-06-16 LAB — RAD ONC ARIA SESSION SUMMARY
Course Elapsed Days: 10
Plan Fractions Treated to Date: 7
Plan Prescribed Dose Per Fraction: 2 Gy
Plan Total Fractions Prescribed: 30
Plan Total Prescribed Dose: 60 Gy
Reference Point Dosage Given to Date: 14 Gy
Reference Point Session Dosage Given: 2 Gy
Session Number: 7

## 2023-06-17 ENCOUNTER — Other Ambulatory Visit: Payer: Self-pay

## 2023-06-17 ENCOUNTER — Ambulatory Visit
Admission: RE | Admit: 2023-06-17 | Discharge: 2023-06-17 | Disposition: A | Discharge status: Home / Self Care | Payer: BC Managed Care – PPO | Source: Ambulatory Visit | Attending: Radiation Oncology | Admitting: Radiation Oncology

## 2023-06-17 DIAGNOSIS — C3431 Malignant neoplasm of lower lobe, right bronchus or lung: Secondary | ICD-10-CM | POA: Diagnosis not present

## 2023-06-17 LAB — RAD ONC ARIA SESSION SUMMARY
Course Elapsed Days: 11
Plan Fractions Treated to Date: 8
Plan Prescribed Dose Per Fraction: 2 Gy
Plan Total Fractions Prescribed: 30
Plan Total Prescribed Dose: 60 Gy
Reference Point Dosage Given to Date: 16 Gy
Reference Point Session Dosage Given: 2 Gy
Session Number: 8

## 2023-06-17 NOTE — Progress Notes (Unsigned)
[facility] OFFICE PROGRESS NOTE  [name], MD [address], [address] [place] [address]  DIAGNOSIS: Stage IIIb (T4, N2, M0) non-small cell lung cancer, squamous cell carcinoma presented with large right lower lobe lung mass in addition to other right upper lobe pulmonary nodule and right hilar and mediastinal lymphadenopathy in September 2024.   PRIOR THERAPY: None  CURRENT THERAPY: A course of concurrent chemoradiation with weekly carboplatin for AUC of 2 and paclitaxel 45 Mg/M2. First dose June 06, 2023. Status post 2 cycles.   INTERVAL HISTORY: Kristy Salazar 82 y.o. female returns to clinic today for a follow-up visit accompanied by her daughter.  The patient was recently diagnosed with lung cancer.  She is here for her third cycle of chemotherapy today.  She has tolerated this well without any significant adverse side effects.  Today she is feeling well.  She denies any fever, chills, night sweats, or unexplained weight loss.  She had a little bit of indigestion on Sunday and took Rolaids which improved her symptoms.  Denies any chest pain or hemoptysis.  She may have mild dyspnea on exertion, such as with walking up steps and bending over.  Denies any significant cough.  She states that she may have a intermittent "regular" cough but is "not too often" and the phlegm is clear.  Denies any nausea, vomiting, or diarrhea. She had mild constipation last week but took stool softener.  Denies any headache or visual changes.  Denies any rashes or skin changes.  She met with radiation oncology and her last day radiation is scheduled for 07/25/22.  She is here today for evaluation repeat blood work before undergoing cycle #3.     MEDICAL HISTORY: Past Medical History:  Diagnosis Date   Arthritis    Asthma    COPD (chronic obstructive pulmonary disease) (HCC)    Diabetes mellitus    patient denies   Hypertension    Seasonal allergies     ALLERGIES:  is allergic to  erythromycin.  MEDICATIONS:  Current Outpatient Medications  Medication Sig Dispense Refill   albuterol (VENTOLIN HFA) 108 (90 Base) MCG/ACT inhaler Inhale 2 puffs into the lungs every 6 (six) hours as needed for wheezing or shortness of breath. 8 g 2   Fluticasone-Umeclidin-Vilant 100-62.5-25 MCG/INH AEPB Inhale 2 application into the lungs.     furosemide (LASIX) 20 MG tablet Take 1-2 tablets in the morning as needed for your foot swelling. 20 tablet 0   lidocaine-prilocaine (EMLA) cream Apply to affected area once 30 g 3   montelukast (SINGULAIR) 10 MG tablet Take 10 mg by mouth daily. For seasonal allergies     prochlorperazine (COMPAZINE) 10 MG tablet Take 1 tablet (10 mg total) by mouth every 6 (six) hours as needed for nausea or vomiting. 30 tablet 1   rosuvastatin (CRESTOR) 10 MG tablet Take 10 mg by mouth daily.       tiotropium (SPIRIVA) 18 MCG inhalation capsule Place 18 mcg into inhaler and inhale daily.     No current facility-administered medications for this visit.    SURGICAL HISTORY:  Past Surgical History:  Procedure Laterality Date   ABDOMINAL HYSTERECTOMY     APPENDECTOMY     BACK SURGERY     BRONCHIAL BIOPSY  05/17/2023   Procedure: BRONCHIAL BIOPSIES;  Surgeon: [name], MD;  Location: [facility] ENDOSCOPY;  Service: Pulmonary;;   BRONCHIAL BRUSHINGS  05/17/2023   Procedure: BRONCHIAL BRUSHINGS;  Surgeon: [name], MD;  Location: [facility] ENDOSCOPY;  Service: Pulmonary;;   BRONCHIAL NEEDLE ASPIRATION BIOPSY  05/17/2023   Procedure: BRONCHIAL NEEDLE ASPIRATION BIOPSIES;  Surgeon: [name], MD;  Location: [facility] ENDOSCOPY;  Service: Pulmonary;;   IR IMAGING GUIDED PORT INSERTION  06/03/2023   JOINT REPLACEMENT     TOTAL HIP ARTHROPLASTY     TOTAL KNEE ARTHROPLASTY     VIDEO BRONCHOSCOPY WITH ENDOBRONCHIAL ULTRASOUND Right 05/17/2023   Procedure: VIDEO BRONCHOSCOPY WITH ENDOBRONCHIAL ULTRASOUND;  Surgeon: [name], MD;  Location: [facility] ENDOSCOPY;  Service:  Pulmonary;  Laterality: Right;    REVIEW OF SYSTEMS:   Constitutional: Negative for appetite change, chills, fatigue, fever and unexpected weight change.  HENT: Negative for mouth sores, nosebleeds, sore throat and trouble swallowing.   Eyes: Negative for eye problems and icterus.  Respiratory: Positive for mild dyspnea on exertion with certain activities. Negative for cough, hemoptysis, and wheezing.   Cardiovascular: Negative for chest pain and leg swelling.  Gastrointestinal: Negative for abdominal pain, constipation, diarrhea, nausea and vomiting.  Genitourinary: Negative for bladder incontinence, difficulty urinating, dysuria, frequency and hematuria.   Musculoskeletal: Negative for back pain, gait problem, neck pain and neck stiffness.  Skin: Negative for itching and rash.  Neurological: Negative for dizziness, extremity weakness, gait problem, headaches, light-headedness and seizures.  Hematological: Negative for adenopathy. Does not bruise/bleed easily.  Psychiatric/Behavioral: Negative for confusion, depression and sleep disturbance. The patient is not nervous/anxious.     PHYSICAL EXAMINATION:  There were no vitals taken for this visit.  ECOG PERFORMANCE STATUS: 1  Physical Exam  Constitutional: Oriented to person, place, and time and well-developed, well-nourished, and in no distress.  HENT:  Head: Normocephalic and atraumatic.  Mouth/Throat: Oropharynx is clear and moist. No oropharyngeal exudate.  Eyes: Conjunctivae are normal. Right eye exhibits no discharge. Left eye exhibits no discharge. No scleral icterus.  Neck: Normal range of motion. Neck supple.  Cardiovascular: Normal rate, regular rhythm, normal heart sounds and intact distal pulses.   Pulmonary/Chest: Effort normal and breath sounds normal. No respiratory distress. No wheezes. No rales.  Abdominal: Soft. Bowel sounds are normal. Exhibits no distension and no mass. There is no tenderness.  Musculoskeletal:  Normal range of motion. Exhibits no edema.  Lymphadenopathy:    No cervical adenopathy.  Neurological: Alert and oriented to person, place, and time. Exhibits normal muscle tone. Gait normal. Coordination normal.  Skin: Skin is warm and dry. No rash noted. Not diaphoretic. No erythema. No pallor.  Psychiatric: Mood, memory and judgment normal.  Vitals reviewed.  LABORATORY DATA: Lab Results  Component Value Date   WBC 5.6 06/15/2023   HGB 12.3 06/15/2023   HCT 37.0 06/15/2023   MCV 90.2 06/15/2023   PLT 357 06/15/2023      Chemistry      Component Value Date/Time   NA 136 06/15/2023 1053   K 4.0 06/15/2023 1053   CL 103 06/15/2023 1053   CO2 28 06/15/2023 1053   BUN 12 06/15/2023 1053   CREATININE 0.55 06/15/2023 1053      Component Value Date/Time   CALCIUM 9.2 06/15/2023 1053   ALKPHOS 62 06/15/2023 1053   AST 18 06/15/2023 1053   ALT 16 06/15/2023 1053   BILITOT 0.3 06/15/2023 1053       RADIOGRAPHIC STUDIES:  IR IMAGING GUIDED PORT INSERTION  Result Date: 06/03/2023 INDICATION: 82 year old with right lung cancer. Port-A-Cath needed for treatment. EXAM: FLUOROSCOPIC AND ULTRASOUND GUIDED PLACEMENT OF A SUBCUTANEOUS PORT COMPARISON:  None Available. MEDICATIONS: Moderate  sedation ANESTHESIA/SEDATION: Moderate (conscious) sedation was employed during this procedure. A total of Versed 1 mg and fentanyl 50 mcg was administered intravenously at the order of the provider performing the procedure. Total intra-service moderate sedation time: 28 minutes. Patient's level of consciousness and vital signs were monitored continuously by radiology nurse throughout the procedure under the supervision of the provider performing the procedure. FLUOROSCOPY TIME:  Radiation Exposure Index (as provided by the fluoroscopic device): 1 mGy Kerma COMPLICATIONS: None immediate. PROCEDURE: The procedure, risks, benefits, and alternatives were explained to the patient. Questions regarding the  procedure were encouraged and answered. The patient understands and consents to the procedure. Patient was placed supine on the interventional table. Ultrasound confirmed a patent right internal jugular vein. Ultrasound image was saved for documentation. The right chest and neck were cleaned with a skin antiseptic and a sterile drape was placed. Maximal barrier sterile technique was utilized including caps, mask, sterile gowns, sterile gloves, sterile drape, hand hygiene and skin antiseptic. The right neck was anesthetized with 1% lidocaine. Small incision was made in the right neck with a blade. Micropuncture set was placed in the right internal jugular vein with ultrasound guidance. The micropuncture wire was used for measurement purposes. The right chest was anesthetized with 1% lidocaine with epinephrine. #15 blade was used to make an incision and a subcutaneous port pocket was formed. 8 french Power Port was assembled. Subcutaneous tunnel was formed with a stiff tunneling device. The port catheter was brought through the subcutaneous tunnel. The port was placed in the subcutaneous pocket. The micropuncture set was exchanged for a peel-away sheath. The catheter was placed through the peel-away sheath and the tip was positioned at the superior cavoatrial junction. Catheter placement was confirmed with fluoroscopy. The port was accessed and flushed with heparinized saline. The port pocket was closed using two layers of absorbable sutures and Dermabond. The vein skin site was closed using a single layer of absorbable suture and Dermabond. Sterile dressings were applied. Patient tolerated the procedure well without an immediate complication. Ultrasound and fluoroscopic images were taken and saved for this procedure. IMPRESSION: Placement of a subcutaneous power-injectable port device. Catheter tip at the superior cavoatrial junction. Electronically Signed   By: Richarda Overlie M.D.   On: 06/03/2023 16:12   NM PET Image  Initial (PI) Skull Base To Thigh (F-18 FDG)  Result Date: 05/23/2023 CLINICAL DATA:  Initial treatment strategy for pulmonary mass. EXAM: NUCLEAR MEDICINE PET SKULL BASE TO THIGH TECHNIQUE: 5.7 mCi F-18 FDG was injected intravenously. Full-ring PET imaging was performed from the skull base to thigh after the radiotracer. CT data was obtained and used for attenuation correction and anatomic localization. Fasting blood glucose: 112 mg/dl COMPARISON:  CT chest 1610 FINDINGS: NECK: No hypermetabolic lymph nodes in the neck. Incidental CT findings: None. CHEST: RIGHT lower lobe mass measures 5.8 x 4.7 cm and has intense metabolic activity with SUV max 13.3 (image 87). Hypermetabolic RIGHT infrahilar nodular thickening surrounding the proximal bronchus to the RIGHT lower lobe measures 2.4 cm (image 77) with SUV max 12.3 on image 81. Intensely hypermetabolic subcarinal lymph node measures 2.7 cm with SUV max equal 11.0. No supraclavicular adenopathy.  No contralateral adenopathy. Previously described RIGHT upper lobe nodule has resolved. Previous described LEFT lower lobe nodule is  not hypermetabolic. Incidental CT findings: Centrilobular emphysema the upper lobes. Coronary artery calcification and aortic atherosclerotic calcification. ABDOMEN/PELVIS: No abnormal hypermetabolic activity within the liver, pancreas, adrenal glands, or spleen. No hypermetabolic lymph nodes in  the abdomen or pelvis. Incidental CT findings: Single gallstone. Atherosclerotic calcification of the aorta. SKELETON: No aggressive osseous lesion.  LEFT hip prosthetic Incidental CT findings: None. IMPRESSION: 1. Intensely hypermetabolic RIGHT lower lobe mass consistent with primary bronchogenic carcinoma. 2. Hypermetabolic RIGHT infrahilar and subcarinal lymph nodes consistent with nodal metastasis. 3. No evidence distant metastatic disease. 4. Aortic Atherosclerosis (ICD10-I70.0) and Emphysema (ICD10-J43.9). Electronically Signed   By: Genevive Bi M.D.   On: 05/23/2023 15:21     ASSESSMENT/PLAN:  This is a very pleasant 82 year old African-American female with stage IIIb (T4, N2, M0) non-small cell lung cancer, squamous cell carcinoma.  She presented with a large right lower lobe lung mass in addition to other right upper lobe pulmonary nodules and right hilar mediastinal lymphadenopathy.  She was diagnosed in September 2024.     PDL1 expression 1% per navigator.    She is scheduled to start concurrent chemoradiation today with weekly carboplatin for an AUC of 2 paclitaxel 45 mg/m. She started on 06/06/23   Labs were reviewed.  Recommend that she proceed with cycle #3 today as scheduled.   We will see her back for follow-up visit in 2 weeks for evaluation and repeat blood work before undergoing cycle #5.  The patient was advised to call immediately if she has any concerning symptoms in the interval. The patient voices understanding of current disease status and treatment options and is in agreement with the current care plan. All questions were answered. The patient knows to call the clinic with any problems, questions or concerns. We can certainly see the patient much sooner if necessary       No orders of the defined types were placed in this encounter.   The total time spent in the appointment was 20-29 minutes  Arantza Darrington L Bradan Congrove, PA-C 06/17/23

## 2023-06-20 ENCOUNTER — Ambulatory Visit
Admission: RE | Admit: 2023-06-20 | Discharge: 2023-06-20 | Disposition: A | Discharge status: Home / Self Care | Payer: BC Managed Care – PPO | Source: Ambulatory Visit | Attending: Radiation Oncology | Admitting: Radiation Oncology

## 2023-06-20 ENCOUNTER — Other Ambulatory Visit: Payer: Self-pay

## 2023-06-20 DIAGNOSIS — C3431 Malignant neoplasm of lower lobe, right bronchus or lung: Secondary | ICD-10-CM | POA: Diagnosis not present

## 2023-06-20 LAB — RAD ONC ARIA SESSION SUMMARY
Course Elapsed Days: 14
Plan Fractions Treated to Date: 9
Plan Prescribed Dose Per Fraction: 2 Gy
Plan Total Fractions Prescribed: 30
Plan Total Prescribed Dose: 60 Gy
Reference Point Dosage Given to Date: 18 Gy
Reference Point Session Dosage Given: 2 Gy
Session Number: 9

## 2023-06-21 ENCOUNTER — Other Ambulatory Visit: Payer: Self-pay

## 2023-06-21 ENCOUNTER — Inpatient Hospital Stay: Payer: BC Managed Care – PPO

## 2023-06-21 ENCOUNTER — Ambulatory Visit
Admission: RE | Admit: 2023-06-21 | Discharge: 2023-06-21 | Disposition: A | Discharge status: Home / Self Care | Payer: BC Managed Care – PPO | Source: Ambulatory Visit | Attending: Radiation Oncology | Admitting: Radiation Oncology

## 2023-06-21 ENCOUNTER — Inpatient Hospital Stay (HOSPITAL_BASED_OUTPATIENT_CLINIC_OR_DEPARTMENT_OTHER): Payer: BC Managed Care – PPO | Admitting: Physician Assistant

## 2023-06-21 VITALS — BP 104/77 | HR 88 | Temp 97.3°F | Resp 18 | Wt 114.0 lb

## 2023-06-21 DIAGNOSIS — C3431 Malignant neoplasm of lower lobe, right bronchus or lung: Secondary | ICD-10-CM

## 2023-06-21 DIAGNOSIS — Z95828 Presence of other vascular implants and grafts: Secondary | ICD-10-CM

## 2023-06-21 DIAGNOSIS — Z5111 Encounter for antineoplastic chemotherapy: Secondary | ICD-10-CM | POA: Diagnosis not present

## 2023-06-21 LAB — CMP (CANCER CENTER ONLY)
ALT: 15 U/L (ref 0–44)
AST: 15 U/L (ref 15–41)
Albumin: 3.5 g/dL (ref 3.5–5.0)
Alkaline Phosphatase: 69 U/L (ref 38–126)
Anion gap: 6 (ref 5–15)
BUN: 15 mg/dL (ref 8–23)
CO2: 27 mmol/L (ref 22–32)
Calcium: 9.1 mg/dL (ref 8.9–10.3)
Chloride: 100 mmol/L (ref 98–111)
Creatinine: 0.61 mg/dL (ref 0.44–1.00)
GFR, Estimated: >60 mL/min (ref >60)
Glucose, Bld: 114 mg/dL — ABNORMAL HIGH (ref 70–99)
Potassium: 4.1 mmol/L (ref 3.5–5.1)
Sodium: 133 mmol/L — ABNORMAL LOW (ref 135–145)
Total Bilirubin: 0.4 mg/dL (ref <1.2)
Total Protein: 7.5 g/dL (ref 6.5–8.1)

## 2023-06-21 LAB — RAD ONC ARIA SESSION SUMMARY
Course Elapsed Days: 15
Plan Fractions Treated to Date: 10
Plan Prescribed Dose Per Fraction: 2 Gy
Plan Total Fractions Prescribed: 30
Plan Total Prescribed Dose: 60 Gy
Reference Point Dosage Given to Date: 20 Gy
Reference Point Session Dosage Given: 2 Gy
Session Number: 10

## 2023-06-21 LAB — CBC WITH DIFFERENTIAL (CANCER CENTER ONLY)
Abs Immature Granulocytes: 0.07 10*3/uL (ref 0.00–0.07)
Basophils Absolute: 0 10*3/uL (ref 0.0–0.1)
Basophils Relative: 1 %
Eosinophils Absolute: 0 10*3/uL (ref 0.0–0.5)
Eosinophils Relative: 0 %
HCT: 36.5 % (ref 36.0–46.0)
Hemoglobin: 12 g/dL (ref 12.0–15.0)
Immature Granulocytes: 1 %
Lymphocytes Relative: 9 %
Lymphs Abs: 0.5 10*3/uL — ABNORMAL LOW (ref 0.7–4.0)
MCH: 29.5 pg (ref 26.0–34.0)
MCHC: 32.9 g/dL (ref 30.0–36.0)
MCV: 89.7 fL (ref 80.0–100.0)
Monocytes Absolute: 0.6 10*3/uL (ref 0.1–1.0)
Monocytes Relative: 11 %
Neutro Abs: 4.4 10*3/uL (ref 1.7–7.7)
Neutrophils Relative %: 78 %
Platelet Count: 321 10*3/uL (ref 150–400)
RBC: 4.07 MIL/uL (ref 3.87–5.11)
RDW: 13.3 % (ref 11.5–15.5)
WBC Count: 5.6 10*3/uL (ref 4.0–10.5)
nRBC: 0 % (ref 0.0–0.2)

## 2023-06-21 MED ORDER — CETIRIZINE HCL 10 MG/ML IV SOLN
10.0000 mg | Freq: Once | INTRAVENOUS | Status: AC
  Administered 2023-06-21: 10 mg via INTRAVENOUS
  Filled 2023-06-21: qty 1

## 2023-06-21 MED ORDER — DEXAMETHASONE SODIUM PHOSPHATE 10 MG/ML IJ SOLN
10.0000 mg | Freq: Once | INTRAMUSCULAR | Status: AC
  Administered 2023-06-21: 10 mg via INTRAVENOUS
  Filled 2023-06-21: qty 1

## 2023-06-21 MED ORDER — SODIUM CHLORIDE 0.9% FLUSH
10.0000 mL | Freq: Once | INTRAVENOUS | Status: AC
  Administered 2023-06-21: 10 mL

## 2023-06-21 MED ORDER — SODIUM CHLORIDE 0.9 % IV SOLN
124.4000 mg | Freq: Once | INTRAVENOUS | Status: AC
  Administered 2023-06-21: 120 mg via INTRAVENOUS
  Filled 2023-06-21: qty 12

## 2023-06-21 MED ORDER — SODIUM CHLORIDE 0.9 % IV SOLN
INTRAVENOUS | Status: DC
Start: 2023-06-21 — End: 2023-06-21

## 2023-06-21 MED ORDER — PALONOSETRON HCL INJECTION 0.25 MG/5ML
0.2500 mg | Freq: Once | INTRAVENOUS | Status: AC
  Administered 2023-06-21: 0.25 mg via INTRAVENOUS
  Filled 2023-06-21: qty 5

## 2023-06-21 MED ORDER — FAMOTIDINE IN NACL 20-0.9 MG/50ML-% IV SOLN
20.0000 mg | Freq: Once | INTRAVENOUS | Status: AC
  Administered 2023-06-21: 20 mg via INTRAVENOUS
  Filled 2023-06-21: qty 50

## 2023-06-21 MED ORDER — SODIUM CHLORIDE 0.9 % IV SOLN
45.0000 mg/m2 | Freq: Once | INTRAVENOUS | Status: AC
  Administered 2023-06-21: 72 mg via INTRAVENOUS
  Filled 2023-06-21: qty 12

## 2023-06-21 NOTE — Patient Instructions (Signed)
[facility] - A DEPT OF [facility]  Discharge Instructions: Thank you for choosing [facility] to provide your oncology and hematology care.   If you have a lab appointment with the Cancer Center, please go directly to the Cancer Center and check in at the registration area.   Wear comfortable clothing and clothing appropriate for easy access to any Portacath or PICC line.   We strive to give you quality time with your provider. You may need to reschedule your appointment if you arrive late (15 or more minutes).  Arriving late affects you and other patients whose appointments are after yours.  Also, if you miss three or more appointments without notifying the office, you may be dismissed from the clinic at the provider's discretion.      For prescription refill requests, have your pharmacy contact our office and allow 72 hours for refills to be completed.    Today you received the following chemotherapy and/or immunotherapy agents paclitaxel, carboplatin      To help prevent nausea and vomiting after your treatment, we encourage you to take your nausea medication as directed.  BELOW ARE SYMPTOMS THAT SHOULD BE REPORTED IMMEDIATELY: *FEVER GREATER THAN 100.4 F (38 C) OR HIGHER *CHILLS OR SWEATING *NAUSEA AND VOMITING THAT IS NOT CONTROLLED WITH YOUR NAUSEA MEDICATION *UNUSUAL SHORTNESS OF BREATH *UNUSUAL BRUISING OR BLEEDING *URINARY PROBLEMS (pain or burning when urinating, or frequent urination) *BOWEL PROBLEMS (unusual diarrhea, constipation, pain near the anus) TENDERNESS IN MOUTH AND THROAT WITH OR WITHOUT PRESENCE OF ULCERS (sore throat, sores in mouth, or a toothache) UNUSUAL RASH, SWELLING OR PAIN  UNUSUAL VAGINAL DISCHARGE OR ITCHING   Items with * indicate a potential emergency and should be followed up as soon as possible or go to the Emergency Department if any problems should occur.  Please show the CHEMOTHERAPY ALERT CARD or  IMMUNOTHERAPY ALERT CARD at check-in to the Emergency Department and triage nurse.  Should you have questions after your visit or need to cancel or reschedule your appointment, please contact [facility] - A DEPT OF [facility]  Dept: [phone]  and follow the prompts.  Office hours are 8:00 a.m. to 4:30 p.m. Monday - Friday. Please note that voicemails left after 4:00 p.m. may not be returned until the following business day.  We are closed weekends and major holidays. You have access to a nurse at all times for urgent questions. Please call the main number to the clinic Dept: [phone] and follow the prompts.   For any non-urgent questions, you may also contact your provider using MyChart. We now offer e-Visits for anyone 72 and older to request care online for non-urgent symptoms. For details visit [email or web address].   Also download the MyChart app! Go to the app store, search "MyChart", open the app, select , and log in with your MyChart username and password.

## 2023-06-22 ENCOUNTER — Other Ambulatory Visit: Payer: Self-pay

## 2023-06-22 ENCOUNTER — Ambulatory Visit
Admission: RE | Admit: 2023-06-22 | Discharge: 2023-06-22 | Disposition: A | Discharge status: Home / Self Care | Payer: BC Managed Care – PPO | Source: Ambulatory Visit | Attending: Radiation Oncology | Admitting: Radiation Oncology

## 2023-06-22 DIAGNOSIS — C3431 Malignant neoplasm of lower lobe, right bronchus or lung: Secondary | ICD-10-CM | POA: Diagnosis not present

## 2023-06-22 LAB — RAD ONC ARIA SESSION SUMMARY
Course Elapsed Days: 16
Plan Fractions Treated to Date: 11
Plan Prescribed Dose Per Fraction: 2 Gy
Plan Total Fractions Prescribed: 30
Plan Total Prescribed Dose: 60 Gy
Reference Point Dosage Given to Date: 22 Gy
Reference Point Session Dosage Given: 2 Gy
Session Number: 11

## 2023-06-23 ENCOUNTER — Ambulatory Visit
Admission: RE | Admit: 2023-06-23 | Discharge: 2023-06-23 | Disposition: A | Discharge status: Home / Self Care | Payer: BC Managed Care – PPO | Source: Ambulatory Visit | Attending: Radiation Oncology | Admitting: Radiation Oncology

## 2023-06-23 ENCOUNTER — Other Ambulatory Visit: Payer: Self-pay

## 2023-06-23 DIAGNOSIS — C3431 Malignant neoplasm of lower lobe, right bronchus or lung: Secondary | ICD-10-CM | POA: Diagnosis not present

## 2023-06-23 LAB — RAD ONC ARIA SESSION SUMMARY
Course Elapsed Days: 17
Plan Fractions Treated to Date: 12
Plan Prescribed Dose Per Fraction: 2 Gy
Plan Total Fractions Prescribed: 30
Plan Total Prescribed Dose: 60 Gy
Reference Point Dosage Given to Date: 24 Gy
Reference Point Session Dosage Given: 2 Gy
Session Number: 12

## 2023-06-24 ENCOUNTER — Ambulatory Visit
Admission: RE | Admit: 2023-06-24 | Discharge: 2023-06-24 | Disposition: A | Discharge status: Home / Self Care | Payer: BC Managed Care – PPO | Source: Ambulatory Visit | Attending: Radiation Oncology | Admitting: Radiation Oncology

## 2023-06-24 ENCOUNTER — Other Ambulatory Visit: Payer: Self-pay

## 2023-06-24 DIAGNOSIS — C3431 Malignant neoplasm of lower lobe, right bronchus or lung: Secondary | ICD-10-CM | POA: Diagnosis not present

## 2023-06-24 LAB — RAD ONC ARIA SESSION SUMMARY
Course Elapsed Days: 18
Plan Fractions Treated to Date: 13
Plan Prescribed Dose Per Fraction: 2 Gy
Plan Total Fractions Prescribed: 30
Plan Total Prescribed Dose: 60 Gy
Reference Point Dosage Given to Date: 26 Gy
Reference Point Session Dosage Given: 2 Gy
Session Number: 13

## 2023-06-27 ENCOUNTER — Inpatient Hospital Stay: Payer: BC Managed Care – PPO

## 2023-06-27 ENCOUNTER — Other Ambulatory Visit: Payer: Self-pay

## 2023-06-27 ENCOUNTER — Telehealth: Payer: Self-pay

## 2023-06-27 ENCOUNTER — Ambulatory Visit
Admission: RE | Admit: 2023-06-27 | Discharge: 2023-06-27 | Disposition: A | Discharge status: Home / Self Care | Payer: BC Managed Care – PPO | Source: Ambulatory Visit | Attending: Radiation Oncology | Admitting: Radiation Oncology

## 2023-06-27 ENCOUNTER — Other Ambulatory Visit: Payer: BC Managed Care – PPO

## 2023-06-27 VITALS — BP 119/74 | HR 96 | Temp 97.6°F | Resp 18 | Wt 114.4 lb

## 2023-06-27 DIAGNOSIS — Z95828 Presence of other vascular implants and grafts: Secondary | ICD-10-CM

## 2023-06-27 DIAGNOSIS — C3431 Malignant neoplasm of lower lobe, right bronchus or lung: Secondary | ICD-10-CM

## 2023-06-27 LAB — CBC WITH DIFFERENTIAL (CANCER CENTER ONLY)
Abs Immature Granulocytes: 0.04 10*3/uL (ref 0.00–0.07)
Basophils Absolute: 0 10*3/uL (ref 0.0–0.1)
Basophils Relative: 0 %
Eosinophils Absolute: 0 10*3/uL (ref 0.0–0.5)
Eosinophils Relative: 1 %
HCT: 37.3 % (ref 36.0–46.0)
Hemoglobin: 12.4 g/dL (ref 12.0–15.0)
Immature Granulocytes: 1 %
Lymphocytes Relative: 10 %
Lymphs Abs: 0.3 10*3/uL — ABNORMAL LOW (ref 0.7–4.0)
MCH: 29.8 pg (ref 26.0–34.0)
MCHC: 33.2 g/dL (ref 30.0–36.0)
MCV: 89.7 fL (ref 80.0–100.0)
Monocytes Absolute: 0.4 10*3/uL (ref 0.1–1.0)
Monocytes Relative: 13 %
Neutro Abs: 2.5 10*3/uL (ref 1.7–7.7)
Neutrophils Relative %: 75 %
Platelet Count: 373 10*3/uL (ref 150–400)
RBC: 4.16 MIL/uL (ref 3.87–5.11)
RDW: 13.5 % (ref 11.5–15.5)
WBC Count: 3.4 10*3/uL — ABNORMAL LOW (ref 4.0–10.5)
nRBC: 0 % (ref 0.0–0.2)

## 2023-06-27 LAB — CMP (CANCER CENTER ONLY)
ALT: 15 U/L (ref 0–44)
AST: 16 U/L (ref 15–41)
Albumin: 3.6 g/dL (ref 3.5–5.0)
Alkaline Phosphatase: 75 U/L (ref 38–126)
Anion gap: 6 (ref 5–15)
BUN: 20 mg/dL (ref 8–23)
CO2: 26 mmol/L (ref 22–32)
Calcium: 9.3 mg/dL (ref 8.9–10.3)
Chloride: 102 mmol/L (ref 98–111)
Creatinine: 0.55 mg/dL (ref 0.44–1.00)
GFR, Estimated: >60 mL/min (ref >60)
Glucose, Bld: 96 mg/dL (ref 70–99)
Potassium: 4.4 mmol/L (ref 3.5–5.1)
Sodium: 134 mmol/L — ABNORMAL LOW (ref 135–145)
Total Bilirubin: 0.3 mg/dL (ref <1.2)
Total Protein: 7.2 g/dL (ref 6.5–8.1)

## 2023-06-27 LAB — RAD ONC ARIA SESSION SUMMARY
Course Elapsed Days: 21
Plan Fractions Treated to Date: 14
Plan Prescribed Dose Per Fraction: 2 Gy
Plan Total Fractions Prescribed: 30
Plan Total Prescribed Dose: 60 Gy
Reference Point Dosage Given to Date: 28 Gy
Reference Point Session Dosage Given: 2 Gy
Session Number: 14

## 2023-06-27 MED ORDER — PALONOSETRON HCL INJECTION 0.25 MG/5ML
0.2500 mg | Freq: Once | INTRAVENOUS | Status: AC
  Administered 2023-06-27: 0.25 mg via INTRAVENOUS
  Filled 2023-06-27: qty 5

## 2023-06-27 MED ORDER — SODIUM CHLORIDE 0.9% FLUSH
10.0000 mL | INTRAVENOUS | Status: DC | PRN
Start: 2023-06-27 — End: 2023-06-27
  Administered 2023-06-27: 10 mL

## 2023-06-27 MED ORDER — SODIUM CHLORIDE 0.9 % IV SOLN: INTRAVENOUS | Status: DC

## 2023-06-27 MED ORDER — DEXAMETHASONE SODIUM PHOSPHATE 10 MG/ML IJ SOLN
10.0000 mg | Freq: Once | INTRAMUSCULAR | Status: AC
Start: 2023-06-27 — End: 2023-06-27
  Administered 2023-06-27: 10 mg via INTRAVENOUS
  Filled 2023-06-27: qty 1

## 2023-06-27 MED ORDER — HEPARIN SOD (PORK) LOCK FLUSH 100 UNIT/ML IV SOLN
500.0000 [IU] | Freq: Once | INTRAVENOUS | Status: AC | PRN
  Administered 2023-06-27: 500 [IU]

## 2023-06-27 MED ORDER — FAMOTIDINE IN NACL 20-0.9 MG/50ML-% IV SOLN
20.0000 mg | Freq: Once | INTRAVENOUS | Status: AC
  Administered 2023-06-27: 20 mg via INTRAVENOUS
  Filled 2023-06-27: qty 50

## 2023-06-27 MED ORDER — SODIUM CHLORIDE 0.9 % IV SOLN
124.4000 mg | Freq: Once | INTRAVENOUS | Status: AC
  Administered 2023-06-27: 120 mg via INTRAVENOUS
  Filled 2023-06-27: qty 12

## 2023-06-27 MED ORDER — CETIRIZINE HCL 10 MG/ML IV SOLN
10.0000 mg | Freq: Once | INTRAVENOUS | Status: AC
  Administered 2023-06-27: 10 mg via INTRAVENOUS
  Filled 2023-06-27: qty 1

## 2023-06-27 MED ORDER — PACLITAXEL CHEMO INJECTION 300 MG/50ML
45.0000 mg/m2 | Freq: Once | INTRAVENOUS | Status: AC
  Administered 2023-06-27: 72 mg via INTRAVENOUS
  Filled 2023-06-27: qty 12

## 2023-06-27 MED ORDER — SODIUM CHLORIDE 0.9% FLUSH
10.0000 mL | Freq: Once | INTRAVENOUS | Status: AC
Start: 2023-06-27 — End: 2023-06-27
  Administered 2023-06-27: 10 mL

## 2023-06-27 NOTE — Patient Instructions (Signed)
 [facility] - A [facility]PT OF [facility]  Discharge Instructions: Thank you for choosing [facility] to provide your oncology and hematology care.   If you have a lab appointment with the Cancer Center, please go directly to the Cancer Center and check in at the registration area.   Wear comfortable clothing and clothing appropriate for easy access to any Portacath or PICC line.   We strive to give you quality time with your provider. You may need to reschedule your appointment if you arrive late (15 or more minutes).  Arriving late affects you and other patients whose appointments are after yours.  Also, if you miss three or more appointments without notifying the office, you may be dismissed from the clinic at the provider's discretion.      For prescription refill requests, have your pharmacy contact our office and allow 72 hours for refills to be completed.    Today you received the following chemotherapy and/or immunotherapy agents paclitaxel, carboplatin      To help prevent nausea and vomiting after your treatment, we encourage you to take your nausea medication as directed.  BELOW ARE SYMPTOMS THAT SHOULD BE REPORTED IMMEDIATELY: *FEVER GREATER THAN 100.4 F (38 C) OR HIGHER *CHILLS OR SWEATING *NAUSEA AND VOMITING THAT IS NOT CONTROLLED WITH YOUR NAUSEA MEDICATION *UNUSUAL SHORTNESS OF BREATH *UNUSUAL BRUISING OR BLEEDING *URINARY PROBLEMS (pain or burning when urinating, or frequent urination) *BOWEL PROBLEMS (unusual diarrhea, constipation, pain near the anus) TENDERNESS IN MOUTH AND THROAT WITH OR WITHOUT PRESENCE OF ULCERS (sore throat, sores in mouth, or a toothache) UNUSUAL RASH, SWELLING OR PAIN  UNUSUAL VAGINAL DISCHARGE OR ITCHING   Items with * indicate a potential emergency and should be followed up as soon as possible or go to the Emergency Department if any problems should occur.  Please show the CHEMOTHERAPY ALERT CARD or  IMMUNOTHERAPY ALERT CARD at check-in to the Emergency Department and triage nurse.  Should you have questions after your visit or need to cancel or reschedule your appointment, please contact [facility] - [facility]  Dept: [phone]  and follow the prompts.  Office hours are 8:00 a.m. to 4:30 p.m. Monday - Friday. Please note that voicemails left after 4:00 p.m. may not be returned until the following business day.  We are closed weekends and major holidays. You have access to a nurse at all times for urgent questions. Please call the main number to the clinic Dept: [phone] and follow the prompts.   For any non-urgent questions, you may also contact your provider using MyChart. We now offer e-Visits for anyone 72 and older to request care online for non-urgent symptoms. For details visit [email or web address].   Also download the MyChart app! Go to the app store, search "MyChart", open the app, select , and log in with your MyChart username and password.

## 2023-06-27 NOTE — Telephone Encounter (Signed)
Patient's daughter notified that patients [employer] documents had been completed and faxed back to company. Fax confirmation received. Copy of documents given to patient's daughter in office.

## 2023-06-28 ENCOUNTER — Ambulatory Visit
Admission: RE | Admit: 2023-06-28 | Discharge: 2023-06-28 | Disposition: A | Discharge status: Home / Self Care | Payer: BC Managed Care – PPO | Source: Ambulatory Visit | Attending: Radiation Oncology | Admitting: Radiation Oncology

## 2023-06-28 ENCOUNTER — Other Ambulatory Visit: Payer: Self-pay

## 2023-06-28 DIAGNOSIS — C3431 Malignant neoplasm of lower lobe, right bronchus or lung: Secondary | ICD-10-CM | POA: Diagnosis not present

## 2023-06-28 LAB — RAD ONC ARIA SESSION SUMMARY
Course Elapsed Days: 22
Plan Fractions Treated to Date: 15
Plan Prescribed Dose Per Fraction: 2 Gy
Plan Total Fractions Prescribed: 30
Plan Total Prescribed Dose: 60 Gy
Reference Point Dosage Given to Date: 30 Gy
Reference Point Session Dosage Given: 2 Gy
Session Number: 15

## 2023-06-29 ENCOUNTER — Ambulatory Visit
Admission: RE | Admit: 2023-06-29 | Discharge: 2023-06-29 | Disposition: A | Discharge status: Home / Self Care | Payer: BC Managed Care – PPO | Source: Ambulatory Visit | Attending: Radiation Oncology | Admitting: Radiation Oncology

## 2023-06-29 ENCOUNTER — Other Ambulatory Visit: Payer: Self-pay

## 2023-06-29 DIAGNOSIS — C3431 Malignant neoplasm of lower lobe, right bronchus or lung: Secondary | ICD-10-CM | POA: Diagnosis not present

## 2023-06-29 LAB — RAD ONC ARIA SESSION SUMMARY
Course Elapsed Days: 23
Plan Fractions Treated to Date: 16
Plan Prescribed Dose Per Fraction: 2 Gy
Plan Total Fractions Prescribed: 30
Plan Total Prescribed Dose: 60 Gy
Reference Point Dosage Given to Date: 32 Gy
Reference Point Session Dosage Given: 2 Gy
Session Number: 16

## 2023-06-30 ENCOUNTER — Other Ambulatory Visit: Payer: Self-pay

## 2023-06-30 ENCOUNTER — Ambulatory Visit
Admission: RE | Admit: 2023-06-30 | Discharge: 2023-06-30 | Disposition: A | Discharge status: Home / Self Care | Payer: BC Managed Care – PPO | Source: Ambulatory Visit | Attending: Radiation Oncology | Admitting: Radiation Oncology

## 2023-06-30 DIAGNOSIS — C3431 Malignant neoplasm of lower lobe, right bronchus or lung: Secondary | ICD-10-CM | POA: Diagnosis not present

## 2023-06-30 LAB — RAD ONC ARIA SESSION SUMMARY
Course Elapsed Days: 24
Plan Fractions Treated to Date: 17
Plan Prescribed Dose Per Fraction: 2 Gy
Plan Total Fractions Prescribed: 30
Plan Total Prescribed Dose: 60 Gy
Reference Point Dosage Given to Date: 34 Gy
Reference Point Session Dosage Given: 2 Gy
Session Number: 17

## 2023-06-30 NOTE — Progress Notes (Addendum)
[facility] OFFICE PROGRESS NOTE  [name], MD [address] [place] [address]  DIAGNOSIS: Stage IIIb (T4, N2, M0) non-small cell lung cancer, squamous cell carcinoma presented with large right lower lobe lung mass in addition to other right upper lobe pulmonary nodule and right hilar and mediastinal lymphadenopathy in September 2024.   PRIOR THERAPY: None  CURRENT THERAPY:  A course of concurrent chemoradiation with weekly carboplatin for AUC of 2 and paclitaxel 45 Mg/M2. First dose June 06, 2023. Status post 4 cycles.   INTERVAL HISTORY: Kristy Salazar 82 y.o. female returns to the clinic today for a follow-up visit accompanied by her daughter.  The patient was recently diagnosed with lung cancer.  She is here for her 4th cycle of chemotherapy today.  She has tolerated this well without any significant adverse side effects except she has been having right sided chest discomfort and indigestion since Friday. She had these symptoms prior to this for a few weeks but not as significant. She has burning in her esophagus, belching, and gas. She was seen by radition oncology today who ordered CTA for PE rule out but this could be related to esophagitis from radiation. She was given a prescription for carafate and hycet. She took a tylenol today. She denies leg swelling, lightheadedness, significant changes in her baseline dyspnea on exertion, jaw pain, diaphoresis,    She denies any fever, chills, or night sweats.  The patient did lose some weight since last being seen due to the esophagitis with swallowing.  She does report she has an appetite.  If she overeats, she may feel nauseous and throw up.  Last week she threw up after eating and felt better with regard to the digestion.   She denies any hemoptysis.  She may have a mild intermittent cough that produces white/milky phlegm.  The patient denies any significant shortness of breath with exertion but her daughter states  that she does get tired with walking long distances.  She still struggles with constipation.  Her last bowel movement was 2 days ago.  She takes a stool softener.  Denies any headache or visual changes.  Her last day radiation is scheduled for 07/26/2023.  She is here today for evaluation repeat blood work before undergoing cycle #5.   MEDICAL HISTORY: Past Medical History:  Diagnosis Date   Arthritis    Asthma    COPD (chronic obstructive pulmonary disease) (HCC)    Diabetes mellitus    patient denies   Hypertension    Seasonal allergies     ALLERGIES:  is allergic to erythromycin.  MEDICATIONS:  Current Outpatient Medications  Medication Sig Dispense Refill   albuterol (VENTOLIN HFA) 108 (90 Base) MCG/ACT inhaler Inhale 2 puffs into the lungs every 6 (six) hours as needed for wheezing or shortness of breath. 8 g 2   Fluticasone-Umeclidin-Vilant 100-62.5-25 MCG/INH AEPB Inhale 2 application into the lungs.     furosemide (LASIX) 20 MG tablet Take 1-2 tablets in the morning as needed for your foot swelling. 20 tablet 0   HYDROcodone-acetaminophen (HYCET) 7.5-325 mg/15 ml solution Take 10 mLs by mouth 4 (four) times daily as needed for moderate pain (pain score 4-6). 120 mL 0   lidocaine-prilocaine (EMLA) cream Apply to affected area once 30 g 3   montelukast (SINGULAIR) 10 MG tablet Take 10 mg by mouth daily. For seasonal allergies     prochlorperazine (COMPAZINE) 10 MG tablet Take 1 tablet (10 mg total) by  mouth every 6 (six) hours as needed for nausea or vomiting. 30 tablet 1   rosuvastatin (CRESTOR) 10 MG tablet Take 10 mg by mouth daily.       sucralfate (CARAFATE) 1 g tablet Take 1 tablet (1 g total) by mouth 4 (four) times daily -  with meals and at bedtime. Crush 1 tablet in 1 oz water and drink 5 min before meals for radiation induced esophagitis 120 tablet 2   tiotropium (SPIRIVA) 18 MCG inhalation capsule Place 18 mcg into inhaler and inhale daily.     Current  Facility-Administered Medications  Medication Dose Route Frequency Provider Last Rate Last Admin   alum & mag hydroxide-simeth (MAALOX/MYLANTA) 200-200-20 MG/5ML suspension 15 mL  15 mL Oral Once Sonnet Rizor L, PA-C       famotidine (PEPCID) IVPB 20 mg premix  20 mg Intravenous Once Shyanne Mcclary L, PA-C        SURGICAL HISTORY:  Past Surgical History:  Procedure Laterality Date   ABDOMINAL HYSTERECTOMY     APPENDECTOMY     BACK SURGERY     BRONCHIAL BIOPSY  05/17/2023   Procedure: BRONCHIAL BIOPSIES;  Surgeon: [name], MD;  Location: [facility];  Service: Pulmonary;;   BRONCHIAL BRUSHINGS  05/17/2023   Procedure: BRONCHIAL BRUSHINGS;  Surgeon: [name], MD;  Location: [facility];  Service: Pulmonary;;   BRONCHIAL NEEDLE ASPIRATION BIOPSY  05/17/2023   Procedure: BRONCHIAL NEEDLE ASPIRATION BIOPSIES;  Surgeon: [name], MD;  Location: [facility];  Service: Pulmonary;;   IR IMAGING GUIDED PORT INSERTION  06/03/2023   JOINT REPLACEMENT     TOTAL HIP ARTHROPLASTY     TOTAL KNEE ARTHROPLASTY     VIDEO BRONCHOSCOPY WITH ENDOBRONCHIAL ULTRASOUND Right 05/17/2023   Procedure: VIDEO BRONCHOSCOPY WITH ENDOBRONCHIAL ULTRASOUND;  Surgeon: [name], MD;  Location: [facility];  Service: Pulmonary;  Laterality: Right;    REVIEW OF SYSTEMS:   Review of Systems  Constitutional: Positive for fatigue, appetite change, and weight loss. Negative for chills and fever.  HENT: Positive for odynophagia/dysphagia. Negative for mouth sores, nosebleeds.  Eyes: Negative for eye problems and icterus.  Respiratory: Positive for intermittent cough and dyspnea on exertion. Negative for  hemoptysis and wheezing.   Cardiovascular: Positive for right sided heartburn. Negative for swelling.  Gastrointestinal: Positive for indigestion/heartburn. Positive for constipation.  Negative for abdominal pain, diarrhea, nausea and vomiting.  Genitourinary: Negative for  bladder incontinence, difficulty urinating, dysuria, frequency and hematuria.   Musculoskeletal: Negative for back pain, gait problem, neck pain and neck stiffness.  Skin: Negative for itching and rash.  Neurological: Negative for dizziness, extremity weakness, gait problem, headaches, light-headedness and seizures.  Hematological: Negative for adenopathy. Does not bruise/bleed easily.  Psychiatric/Behavioral: Negative for confusion, depression and sleep disturbance. The patient is not nervous/anxious.     PHYSICAL EXAMINATION:  Blood pressure 100/72, pulse 100, temperature 97.7 F (36.5 C), temperature source Temporal, resp. rate 20, weight 109 lb (49.4 kg).  ECOG PERFORMANCE STATUS: 1  Physical Exam  Constitutional: Oriented to person, place, and time and thin appearing female, and in no distress.  HENT:  Head: Normocephalic and atraumatic.  Mouth/Throat: Oropharynx is clear and moist. No oropharyngeal exudate.  Eyes: Conjunctivae are normal. Right eye exhibits no discharge. Left eye exhibits no discharge. No scleral icterus.  Neck: Normal range of motion. Neck supple.  Cardiovascular: Normal rate, regular rhythm, normal heart sounds and intact distal pulses.   Pulmonary/Chest: Effort normal and breath sounds normal.  No respiratory distress. No wheezes. No rales.  Abdominal: Soft. Bowel sounds are normal. Exhibits no distension and no mass. There is no tenderness.  Musculoskeletal: Normal range of motion. Exhibits no edema.  Lymphadenopathy:    No cervical adenopathy.  Neurological: Alert and oriented to person, place, and time. Exhibits muscle wasting. Examined in the wheelchair.  Skin: Skin is warm and dry. No rash noted. Not diaphoretic. No erythema. No pallor.  Psychiatric: Mood, memory and judgment normal.  Vitals reviewed.  LABORATORY DATA: Lab Results  Component Value Date   WBC 3.2 (L) 07/04/2023   HGB 12.8 07/04/2023   HCT 37.9 07/04/2023   MCV 87.7 07/04/2023   PLT  353 07/04/2023      Chemistry      Component Value Date/Time   NA 135 07/04/2023 1022   K 4.2 07/04/2023 1022   CL 101 07/04/2023 1022   CO2 26 07/04/2023 1022   BUN 21 07/04/2023 1022   CREATININE 0.61 07/04/2023 1022      Component Value Date/Time   CALCIUM 9.4 07/04/2023 1022   ALKPHOS 80 07/04/2023 1022   AST 17 07/04/2023 1022   ALT 17 07/04/2023 1022   BILITOT 0.3 07/04/2023 1022       RADIOGRAPHIC STUDIES:  No results found.    ASSESSMENT/PLAN:  This is a very pleasant 82 year old African-American female with stage IIIb (T4, N2, M0) non-small cell lung cancer, squamous cell carcinoma.  She presented with a large right lower lobe lung mass in addition to other right upper lobe pulmonary nodules and right hilar mediastinal lymphadenopathy.  She was diagnosed in September 2024.     PDL1 expression 1% per navigator.    She is scheduled to start concurrent chemoradiation today with weekly carboplatin for an AUC of 2 paclitaxel 45 mg/m. She started on 06/06/23.   She has been having right sided chest discomfort that she has been describing he has indigestion and burning.  This could be related to radiation esophagitis.  She was seen by radiation oncology earlier today and they prescribed Hycet and Carafate.  They also ordered a CT angiogram to rule out other etiologies of discomfort such as PE, pneumonia, etc.  We will arrange for EKG today as well.  I reviewed her symptoms with Dr. Arbutus Ped.  As long as her EKG is normal, he would recommend proceeding with treatment today as scheduled. Reviewed her EKG with Dr. Arbutus Ped. No ST elevation. It is likely that her symptoms are related to radiation-induced esophagitis. Her oxygen is 97% on RA. Pulse 100. She is not tachypnic  and is overall well appearing. However, we discussed signs and symptoms that would warrant ER evaluation such as shortness of breath, lightheadedness, numbness/tingling, pain, diaphoresis, etc.   I have placed  an order for Maalox and Pepcid to be given while in the infusion room today.   We also discussed constipation education and she was advised to take a laxative if she has not had a bowel movement every day or every other day. Discussed it is ok to take gas-x as well. She make take antacids at home as well.    Labs were reviewed.  Recommend that she proceed with cycle #5 today as scheduled.   We will see her back for follow-up visit in 2 weeks for evaluation and repeat blood work before undergoing cycle #7.  Addendum: After receiving pepcid and maalox, she had significant improvement/resolution in her chest discomfort and was able to eat in the infusion room.  The patient was advised to call immediately if she has any concerning symptoms in the interval. The patient voices understanding of current disease status and treatment options and is in agreement with the current care plan. All questions were answered. The patient knows to call the clinic with any problems, questions or concerns. We can certainly see the patient much sooner if necessary     Orders Placed This Encounter  Procedures   EKG 12-Lead    The total time spent in the appointment was 30-39 minutes  Erie Radu L Blondine Hottel, PA-C 07/04/23

## 2023-07-01 ENCOUNTER — Ambulatory Visit
Admission: RE | Admit: 2023-07-01 | Discharge: 2023-07-01 | Disposition: A | Discharge status: Home / Self Care | Payer: BC Managed Care – PPO | Source: Ambulatory Visit | Attending: Radiation Oncology | Admitting: Radiation Oncology

## 2023-07-01 ENCOUNTER — Other Ambulatory Visit: Payer: Self-pay

## 2023-07-01 DIAGNOSIS — C3431 Malignant neoplasm of lower lobe, right bronchus or lung: Secondary | ICD-10-CM | POA: Diagnosis not present

## 2023-07-01 LAB — RAD ONC ARIA SESSION SUMMARY
Course Elapsed Days: 25
Plan Fractions Treated to Date: 18
Plan Prescribed Dose Per Fraction: 2 Gy
Plan Total Fractions Prescribed: 30
Plan Total Prescribed Dose: 60 Gy
Reference Point Dosage Given to Date: 36 Gy
Reference Point Session Dosage Given: 2 Gy
Session Number: 18

## 2023-07-04 ENCOUNTER — Encounter (HOSPITAL_COMMUNITY): Payer: Self-pay

## 2023-07-04 ENCOUNTER — Other Ambulatory Visit: Payer: Self-pay

## 2023-07-04 ENCOUNTER — Ambulatory Visit (HOSPITAL_COMMUNITY)
Admission: RE | Admit: 2023-07-04 | Discharge: 2023-07-04 | Disposition: A | Discharge status: Home / Self Care | Payer: BC Managed Care – PPO | Source: Ambulatory Visit | Attending: Radiation Oncology | Admitting: Radiation Oncology

## 2023-07-04 ENCOUNTER — Inpatient Hospital Stay: Payer: BC Managed Care – PPO

## 2023-07-04 ENCOUNTER — Telehealth: Payer: Self-pay | Admitting: *Deleted

## 2023-07-04 ENCOUNTER — Other Ambulatory Visit: Payer: Self-pay | Admitting: Radiation Oncology

## 2023-07-04 ENCOUNTER — Inpatient Hospital Stay (HOSPITAL_BASED_OUTPATIENT_CLINIC_OR_DEPARTMENT_OTHER): Payer: BC Managed Care – PPO | Admitting: Physician Assistant

## 2023-07-04 ENCOUNTER — Ambulatory Visit
Admission: RE | Admit: 2023-07-04 | Discharge: 2023-07-04 | Disposition: A | Discharge status: Home / Self Care | Payer: BC Managed Care – PPO | Source: Ambulatory Visit | Attending: Radiation Oncology | Admitting: Radiation Oncology

## 2023-07-04 ENCOUNTER — Encounter: Payer: Self-pay | Admitting: Internal Medicine

## 2023-07-04 VITALS — BP 100/72 | HR 100 | Temp 97.7°F | Resp 20 | Wt 109.0 lb

## 2023-07-04 DIAGNOSIS — C3431 Malignant neoplasm of lower lobe, right bronchus or lung: Secondary | ICD-10-CM

## 2023-07-04 DIAGNOSIS — Z95828 Presence of other vascular implants and grafts: Secondary | ICD-10-CM

## 2023-07-04 DIAGNOSIS — Z5111 Encounter for antineoplastic chemotherapy: Secondary | ICD-10-CM | POA: Diagnosis not present

## 2023-07-04 DIAGNOSIS — R12 Heartburn: Secondary | ICD-10-CM

## 2023-07-04 DIAGNOSIS — R0789 Other chest pain: Secondary | ICD-10-CM | POA: Diagnosis not present

## 2023-07-04 LAB — CBC WITH DIFFERENTIAL (CANCER CENTER ONLY)
Abs Immature Granulocytes: 0.01 10*3/uL (ref 0.00–0.07)
Basophils Absolute: 0 10*3/uL (ref 0.0–0.1)
Basophils Relative: 1 %
Eosinophils Absolute: 0 10*3/uL (ref 0.0–0.5)
Eosinophils Relative: 0 %
HCT: 37.9 % (ref 36.0–46.0)
Hemoglobin: 12.8 g/dL (ref 12.0–15.0)
Immature Granulocytes: 0 %
Lymphocytes Relative: 9 %
Lymphs Abs: 0.3 10*3/uL — ABNORMAL LOW (ref 0.7–4.0)
MCH: 29.6 pg (ref 26.0–34.0)
MCHC: 33.8 g/dL (ref 30.0–36.0)
MCV: 87.7 fL (ref 80.0–100.0)
Monocytes Absolute: 0.6 10*3/uL (ref 0.1–1.0)
Monocytes Relative: 18 %
Neutro Abs: 2.3 10*3/uL (ref 1.7–7.7)
Neutrophils Relative %: 72 %
Platelet Count: 353 10*3/uL (ref 150–400)
RBC: 4.32 MIL/uL (ref 3.87–5.11)
RDW: 14.3 % (ref 11.5–15.5)
WBC Count: 3.2 10*3/uL — ABNORMAL LOW (ref 4.0–10.5)
nRBC: 0 % (ref 0.0–0.2)

## 2023-07-04 LAB — RAD ONC ARIA SESSION SUMMARY
Course Elapsed Days: 28
Plan Fractions Treated to Date: 19
Plan Prescribed Dose Per Fraction: 2 Gy
Plan Total Fractions Prescribed: 30
Plan Total Prescribed Dose: 60 Gy
Reference Point Dosage Given to Date: 38 Gy
Reference Point Session Dosage Given: 2 Gy
Session Number: 19

## 2023-07-04 LAB — CMP (CANCER CENTER ONLY)
ALT: 17 U/L (ref 0–44)
AST: 17 U/L (ref 15–41)
Albumin: 3.7 g/dL (ref 3.5–5.0)
Alkaline Phosphatase: 80 U/L (ref 38–126)
Anion gap: 8 (ref 5–15)
BUN: 21 mg/dL (ref 8–23)
CO2: 26 mmol/L (ref 22–32)
Calcium: 9.4 mg/dL (ref 8.9–10.3)
Chloride: 101 mmol/L (ref 98–111)
Creatinine: 0.61 mg/dL (ref 0.44–1.00)
GFR, Estimated: >60 mL/min (ref >60)
Glucose, Bld: 100 mg/dL — ABNORMAL HIGH (ref 70–99)
Potassium: 4.2 mmol/L (ref 3.5–5.1)
Sodium: 135 mmol/L (ref 135–145)
Total Bilirubin: 0.3 mg/dL (ref <1.2)
Total Protein: 7.1 g/dL (ref 6.5–8.1)

## 2023-07-04 MED ORDER — ALUM & MAG HYDROXIDE-SIMETH 200-200-20 MG/5ML PO SUSP
15.0000 mL | Freq: Once | ORAL | Status: AC
  Administered 2023-07-04: 15 mL via ORAL
  Filled 2023-07-04: qty 30

## 2023-07-04 MED ORDER — HEPARIN SOD (PORK) LOCK FLUSH 100 UNIT/ML IV SOLN
INTRAVENOUS | Status: AC
  Filled 2023-07-04: qty 5

## 2023-07-04 MED ORDER — IOHEXOL 350 MG/ML SOLN
75.0000 mL | Freq: Once | INTRAVENOUS | Status: AC | PRN
  Administered 2023-07-04: 75 mL via INTRAVENOUS

## 2023-07-04 MED ORDER — HEPARIN SOD (PORK) LOCK FLUSH 100 UNIT/ML IV SOLN
500.0000 [IU] | Freq: Once | INTRAVENOUS | Status: AC
  Administered 2023-07-04: 500 [IU] via INTRAVENOUS

## 2023-07-04 MED ORDER — HYDROCODONE-ACETAMINOPHEN 7.5-325 MG/15ML PO SOLN: 10.0000 mL | Freq: Four times a day (QID) | ORAL | 0 refills | Status: DC | PRN

## 2023-07-04 MED ORDER — SUCRALFATE 1 G PO TABS: 1.0000 g | ORAL_TABLET | Freq: Three times a day (TID) | ORAL | 2 refills | Status: DC

## 2023-07-04 MED ORDER — FAMOTIDINE IN NACL 20-0.9 MG/50ML-% IV SOLN: 20.0000 mg | Freq: Once | INTRAVENOUS | Status: DC

## 2023-07-04 MED ORDER — FAMOTIDINE 20 MG PO TABS
20.0000 mg | ORAL_TABLET | Freq: Once | ORAL | Status: AC
  Administered 2023-07-04: 20 mg via ORAL
  Filled 2023-07-04: qty 1

## 2023-07-04 MED ORDER — CARBOPLATIN CHEMO INJECTION 450 MG/45ML
124.4000 mg | Freq: Once | INTRAVENOUS | Status: AC
  Administered 2023-07-04: 120 mg via INTRAVENOUS
  Filled 2023-07-04: qty 12

## 2023-07-04 MED ORDER — ALUM & MAG HYDROXIDE-SIMETH 200-200-20 MG/5ML PO SUSP: 15.0000 mL | Freq: Once | ORAL | Status: DC

## 2023-07-04 MED ORDER — SODIUM CHLORIDE 0.9 % IV SOLN
INTRAVENOUS | Status: DC
Start: 2023-07-04 — End: 2023-07-04

## 2023-07-04 MED ORDER — HEPARIN SOD (PORK) LOCK FLUSH 100 UNIT/ML IV SOLN
500.0000 [IU] | Freq: Once | INTRAVENOUS | Status: AC | PRN
  Administered 2023-07-04: 500 [IU]

## 2023-07-04 MED ORDER — SODIUM CHLORIDE 0.9% FLUSH
10.0000 mL | INTRAVENOUS | Status: DC | PRN
Start: 2023-07-04 — End: 2023-07-04
  Administered 2023-07-04: 10 mL

## 2023-07-04 MED ORDER — FAMOTIDINE IN NACL 20-0.9 MG/50ML-% IV SOLN
20.0000 mg | Freq: Once | INTRAVENOUS | Status: AC
  Administered 2023-07-04: 20 mg via INTRAVENOUS
  Filled 2023-07-04: qty 50

## 2023-07-04 MED ORDER — CETIRIZINE HCL 10 MG/ML IV SOLN
10.0000 mg | Freq: Once | INTRAVENOUS | Status: AC
  Administered 2023-07-04: 10 mg via INTRAVENOUS
  Filled 2023-07-04: qty 1

## 2023-07-04 MED ORDER — SODIUM CHLORIDE 0.9 % IV SOLN
45.0000 mg/m2 | Freq: Once | INTRAVENOUS | Status: AC
  Administered 2023-07-04: 72 mg via INTRAVENOUS
  Filled 2023-07-04: qty 12

## 2023-07-04 MED ORDER — PALONOSETRON HCL INJECTION 0.25 MG/5ML
0.2500 mg | Freq: Once | INTRAVENOUS | Status: AC
  Administered 2023-07-04: 0.25 mg via INTRAVENOUS
  Filled 2023-07-04: qty 5

## 2023-07-04 MED ORDER — DEXAMETHASONE SODIUM PHOSPHATE 10 MG/ML IJ SOLN
10.0000 mg | Freq: Once | INTRAMUSCULAR | Status: AC
Start: 2023-07-04 — End: 2023-07-04
  Administered 2023-07-04: 10 mg via INTRAVENOUS
  Filled 2023-07-04: qty 1

## 2023-07-04 MED ORDER — SODIUM CHLORIDE 0.9% FLUSH
10.0000 mL | Freq: Once | INTRAVENOUS | Status: AC
  Administered 2023-07-04: 10 mL

## 2023-07-04 NOTE — Progress Notes (Signed)
Ms. Kelm is a pleasant 82 y.o. female who is undergoing chemoradiation for Stage IIIb (T4, N2, M0) non-small cell lung cancer, squamous cell carcinoma of the RLL. The patient has received 19  of the planned 33 fractions of radiation. She came around to the clinic after her treatment today due to complaints of sharp doubling over pain with eating, and with belching.  In addition, the patient has been noticing more shortness of breath at rest she states that last week she was doing pretty well but over the weekend was hardly able to eat or drink due to this pain.  She states that she has been using her Trelegy inhaler, rescue inhaler 2-3 times per week which is somewhat typical for her and continues her Spiriva.  She denies any lower extremity edema or acute episodes of gasping for breath.  She has not had any low O2 readings at home as she checks her blood pressure and oxygen levels regularly, last week her oxygen level was 98% on room air in our clinic, today it was 99%.  She has however hypotensive, tachycardic, and tachypneic today in clinic.  In general she is a thin appearing African-American woman in no acute distress she is alert and oriented x 4 and appropriate throughout the examination.  She has tachycardic rate but no clicks rubs or murmurs are auscultated over the precordium, chest is clear to auscultation bilaterally without rales, rhonchi or diminished breath sounds.  She does not have any visible lower extremity edema.  Today we discussed the rationale for treating her symptoms which are likely the result of esophagitis from the treatment fields of her mediastinal lymph nodes.  Her esophagus is visibly in close proximity and somewhat compressed on her pet imaging from prior to her treatment.  We discussed the use of Carafate slurry, Hycet for pain medicine as long as her blood pressure is 100/80 or greater, simethicone for belching, and even considering a tablespoon of cooking oil as needed to  keep the esophagus coated so that food can more easily slip into the stomach.  Given her diagnosis of malignancy, and feelings of shortness of breath and visible tachypnea, we discussed out of abundance of caution heading into a holiday week to proceed with a stat CTPA to rule out embolism.  She and her daughter are in agreement with this plan.  She will also have blood work to see if she is dehydrated when she goes upstairs today in lieu of her infusion, and I will touch base with medical oncology as well regarding this.     Osker Mason, PAC

## 2023-07-04 NOTE — Progress Notes (Signed)
I Spoke with the patient's daughter this evening to review the discharge results from the CT scan, no evidence of clot, and her primary tumor and adenopathy have improved.  She will continue with symptom management with the approach as we discussed today, and with chemoRT. She will notify us if she develops progressive symptoms.

## 2023-07-04 NOTE — Telephone Encounter (Signed)
Called patient to inform of CT Angio for 07-04-23- arrival time- 4:45 pm @ [facility], no restrictions to scan, spoke with patient's daughter Celine Mans and she is aware of this scan and the instructions

## 2023-07-04 NOTE — Patient Instructions (Signed)
[facility] - A DEPT OF [facility]  Discharge Instructions: Thank you for choosing [facility] to provide your oncology and hematology care.   If you have a lab appointment with the Cancer Center, please go directly to the Cancer Center and check in at the registration area.   Wear comfortable clothing and clothing appropriate for easy access to any Portacath or PICC line.   We strive to give you quality time with your provider. You may need to reschedule your appointment if you arrive late (15 or more minutes).  Arriving late affects you and other patients whose appointments are after yours.  Also, if you miss three or more appointments without notifying the office, you may be dismissed from the clinic at the provider's discretion.      For prescription refill requests, have your pharmacy contact our office and allow 72 hours for refills to be completed.    Today you received the following chemotherapy and/or immunotherapy agents: Paclitaxel and Carboplatin      To help prevent nausea and vomiting after your treatment, we encourage you to take your nausea medication as directed.  BELOW ARE SYMPTOMS THAT SHOULD BE REPORTED IMMEDIATELY: *FEVER GREATER THAN 100.4 F (38 C) OR HIGHER *CHILLS OR SWEATING *NAUSEA AND VOMITING THAT IS NOT CONTROLLED WITH YOUR NAUSEA MEDICATION *UNUSUAL SHORTNESS OF BREATH *UNUSUAL BRUISING OR BLEEDING *URINARY PROBLEMS (pain or burning when urinating, or frequent urination) *BOWEL PROBLEMS (unusual diarrhea, constipation, pain near the anus) TENDERNESS IN MOUTH AND THROAT WITH OR WITHOUT PRESENCE OF ULCERS (sore throat, sores in mouth, or a toothache) UNUSUAL RASH, SWELLING OR PAIN  UNUSUAL VAGINAL DISCHARGE OR ITCHING   Items with * indicate a potential emergency and should be followed up as soon as possible or go to the Emergency Department if any problems should occur.  Please show the CHEMOTHERAPY ALERT CARD  or IMMUNOTHERAPY ALERT CARD at check-in to the Emergency Department and triage nurse.  Should you have questions after your visit or need to cancel or reschedule your appointment, please contact [facility] - A DEPT OF [facility]  Dept: [phone]  and follow the prompts.  Office hours are 8:00 a.m. to 4:30 p.m. Monday - Friday. Please note that voicemails left after 4:00 p.m. may not be returned until the following business day.  We are closed weekends and major holidays. You have access to a nurse at all times for urgent questions. Please call the main number to the clinic Dept: [phone] and follow the prompts.   For any non-urgent questions, you may also contact your provider using MyChart. We now offer e-Visits for anyone 68 and older to request care online for non-urgent symptoms. For details visit [email or web address].   Also download the MyChart app! Go to the app store, search "MyChart", open the app, select [facility], and log in with your MyChart username and password.

## 2023-07-05 ENCOUNTER — Other Ambulatory Visit: Payer: Self-pay

## 2023-07-05 ENCOUNTER — Ambulatory Visit
Admission: RE | Admit: 2023-07-05 | Discharge: 2023-07-05 | Disposition: A | Discharge status: Home / Self Care | Payer: BC Managed Care – PPO | Source: Ambulatory Visit | Attending: Radiation Oncology | Admitting: Radiation Oncology

## 2023-07-05 DIAGNOSIS — C3431 Malignant neoplasm of lower lobe, right bronchus or lung: Secondary | ICD-10-CM | POA: Diagnosis not present

## 2023-07-05 LAB — RAD ONC ARIA SESSION SUMMARY
Course Elapsed Days: 29
Plan Fractions Treated to Date: 20
Plan Prescribed Dose Per Fraction: 2 Gy
Plan Total Fractions Prescribed: 30
Plan Total Prescribed Dose: 60 Gy
Reference Point Dosage Given to Date: 40 Gy
Reference Point Session Dosage Given: 2 Gy
Session Number: 20

## 2023-07-07 ENCOUNTER — Other Ambulatory Visit: Payer: Self-pay | Admitting: Internal Medicine

## 2023-07-07 ENCOUNTER — Ambulatory Visit
Admission: RE | Admit: 2023-07-07 | Discharge: 2023-07-07 | Disposition: A | Discharge status: Home / Self Care | Payer: BC Managed Care – PPO | Source: Ambulatory Visit | Attending: Radiation Oncology | Admitting: Radiation Oncology

## 2023-07-07 ENCOUNTER — Other Ambulatory Visit: Payer: Self-pay

## 2023-07-07 DIAGNOSIS — C3431 Malignant neoplasm of lower lobe, right bronchus or lung: Secondary | ICD-10-CM | POA: Diagnosis not present

## 2023-07-07 LAB — RAD ONC ARIA SESSION SUMMARY
Course Elapsed Days: 31
Plan Fractions Treated to Date: 21
Plan Prescribed Dose Per Fraction: 2 Gy
Plan Total Fractions Prescribed: 30
Plan Total Prescribed Dose: 60 Gy
Reference Point Dosage Given to Date: 42 Gy
Reference Point Session Dosage Given: 2 Gy
Session Number: 21

## 2023-07-08 ENCOUNTER — Other Ambulatory Visit: Payer: Self-pay

## 2023-07-08 ENCOUNTER — Ambulatory Visit
Admission: RE | Admit: 2023-07-08 | Discharge: 2023-07-08 | Disposition: A | Discharge status: Home / Self Care | Payer: BC Managed Care – PPO | Source: Ambulatory Visit | Attending: Radiation Oncology | Admitting: Radiation Oncology

## 2023-07-08 DIAGNOSIS — C3431 Malignant neoplasm of lower lobe, right bronchus or lung: Secondary | ICD-10-CM | POA: Diagnosis not present

## 2023-07-08 LAB — RAD ONC ARIA SESSION SUMMARY
Course Elapsed Days: 32
Plan Fractions Treated to Date: 22
Plan Prescribed Dose Per Fraction: 2 Gy
Plan Total Fractions Prescribed: 30
Plan Total Prescribed Dose: 60 Gy
Reference Point Dosage Given to Date: 44 Gy
Reference Point Session Dosage Given: 2 Gy
Session Number: 22

## 2023-07-11 ENCOUNTER — Ambulatory Visit
Admission: RE | Admit: 2023-07-11 | Discharge: 2023-07-11 | Disposition: A | Discharge status: Home / Self Care | Payer: BC Managed Care – PPO | Source: Ambulatory Visit | Attending: Radiation Oncology | Admitting: Radiation Oncology

## 2023-07-11 ENCOUNTER — Inpatient Hospital Stay: Payer: BC Managed Care – PPO

## 2023-07-11 ENCOUNTER — Other Ambulatory Visit: Payer: Self-pay

## 2023-07-11 VITALS — BP 97/72 | HR 99 | Temp 98.0°F | Resp 20 | Wt 108.5 lb

## 2023-07-11 DIAGNOSIS — C3431 Malignant neoplasm of lower lobe, right bronchus or lung: Secondary | ICD-10-CM

## 2023-07-11 LAB — CMP (CANCER CENTER ONLY)
ALT: 15 U/L (ref 0–44)
AST: 18 U/L (ref 15–41)
Albumin: 3.4 g/dL — ABNORMAL LOW (ref 3.5–5.0)
Alkaline Phosphatase: 66 U/L (ref 38–126)
Anion gap: 6 (ref 5–15)
BUN: 12 mg/dL (ref 8–23)
CO2: 28 mmol/L (ref 22–32)
Calcium: 9.2 mg/dL (ref 8.9–10.3)
Chloride: 103 mmol/L (ref 98–111)
Creatinine: 0.59 mg/dL (ref 0.44–1.00)
GFR, Estimated: >60 mL/min (ref >60)
Glucose, Bld: 118 mg/dL — ABNORMAL HIGH (ref 70–99)
Potassium: 3.7 mmol/L (ref 3.5–5.1)
Sodium: 137 mmol/L (ref 135–145)
Total Bilirubin: 0.3 mg/dL (ref 0.0–1.2)
Total Protein: 6.9 g/dL (ref 6.5–8.1)

## 2023-07-11 LAB — RAD ONC ARIA SESSION SUMMARY
Course Elapsed Days: 35
Plan Fractions Treated to Date: 23
Plan Prescribed Dose Per Fraction: 2 Gy
Plan Total Fractions Prescribed: 30
Plan Total Prescribed Dose: 60 Gy
Reference Point Dosage Given to Date: 46 Gy
Reference Point Session Dosage Given: 2 Gy
Session Number: 23

## 2023-07-11 LAB — CBC WITH DIFFERENTIAL (CANCER CENTER ONLY)
Abs Immature Granulocytes: 0.02 10*3/uL (ref 0.00–0.07)
Basophils Absolute: 0 10*3/uL (ref 0.0–0.1)
Basophils Relative: 1 %
Eosinophils Absolute: 0 10*3/uL (ref 0.0–0.5)
Eosinophils Relative: 0 %
HCT: 34.8 % — ABNORMAL LOW (ref 36.0–46.0)
Hemoglobin: 11.9 g/dL — ABNORMAL LOW (ref 12.0–15.0)
Immature Granulocytes: 1 %
Lymphocytes Relative: 7 %
Lymphs Abs: 0.2 10*3/uL — ABNORMAL LOW (ref 0.7–4.0)
MCH: 30.1 pg (ref 26.0–34.0)
MCHC: 34.2 g/dL (ref 30.0–36.0)
MCV: 88.1 fL (ref 80.0–100.0)
Monocytes Absolute: 0.4 10*3/uL (ref 0.1–1.0)
Monocytes Relative: 14 %
Neutro Abs: 2.4 10*3/uL (ref 1.7–7.7)
Neutrophils Relative %: 77 %
Platelet Count: 262 10*3/uL (ref 150–400)
RBC: 3.95 MIL/uL (ref 3.87–5.11)
RDW: 14.6 % (ref 11.5–15.5)
WBC Count: 3.1 10*3/uL — ABNORMAL LOW (ref 4.0–10.5)
nRBC: 0 % (ref 0.0–0.2)

## 2023-07-11 MED ORDER — SODIUM CHLORIDE 0.9% FLUSH
10.0000 mL | INTRAVENOUS | Status: DC | PRN
Start: 2023-07-11 — End: 2023-07-11
  Administered 2023-07-11: 10 mL

## 2023-07-11 MED ORDER — PALONOSETRON HCL INJECTION 0.25 MG/5ML
0.2500 mg | Freq: Once | INTRAVENOUS | Status: AC
  Administered 2023-07-11: 0.25 mg via INTRAVENOUS
  Filled 2023-07-11: qty 5

## 2023-07-11 MED ORDER — CETIRIZINE HCL 10 MG/ML IV SOLN
10.0000 mg | Freq: Once | INTRAVENOUS | Status: AC
Start: 2023-07-11 — End: 2023-07-11
  Administered 2023-07-11: 10 mg via INTRAVENOUS
  Filled 2023-07-11: qty 1

## 2023-07-11 MED ORDER — SODIUM CHLORIDE 0.9 % IV SOLN
INTRAVENOUS | Status: DC
Start: 2023-07-11 — End: 2023-07-11

## 2023-07-11 MED ORDER — FAMOTIDINE IN NACL 20-0.9 MG/50ML-% IV SOLN
20.0000 mg | Freq: Once | INTRAVENOUS | Status: AC
  Administered 2023-07-11: 20 mg via INTRAVENOUS
  Filled 2023-07-11: qty 50

## 2023-07-11 MED ORDER — SODIUM CHLORIDE 0.9 % IV SOLN
124.4000 mg | Freq: Once | INTRAVENOUS | Status: AC
  Administered 2023-07-11: 120 mg via INTRAVENOUS
  Filled 2023-07-11: qty 12

## 2023-07-11 MED ORDER — HEPARIN SOD (PORK) LOCK FLUSH 100 UNIT/ML IV SOLN
500.0000 [IU] | Freq: Once | INTRAVENOUS | Status: AC | PRN
  Administered 2023-07-11: 500 [IU]

## 2023-07-11 MED ORDER — PACLITAXEL CHEMO INJECTION 300 MG/50ML
45.0000 mg/m2 | Freq: Once | INTRAVENOUS | Status: AC
  Administered 2023-07-11: 72 mg via INTRAVENOUS
  Filled 2023-07-11: qty 12

## 2023-07-11 MED ORDER — DEXAMETHASONE SODIUM PHOSPHATE 10 MG/ML IJ SOLN
10.0000 mg | Freq: Once | INTRAMUSCULAR | Status: AC
  Administered 2023-07-11: 10 mg via INTRAVENOUS
  Filled 2023-07-11: qty 1

## 2023-07-11 NOTE — Patient Instructions (Signed)

## 2023-07-12 ENCOUNTER — Ambulatory Visit
Admission: RE | Admit: 2023-07-12 | Discharge: 2023-07-12 | Disposition: A | Discharge status: Home / Self Care | Payer: BC Managed Care – PPO | Source: Ambulatory Visit | Attending: Radiation Oncology | Admitting: Radiation Oncology

## 2023-07-12 ENCOUNTER — Other Ambulatory Visit: Payer: Self-pay

## 2023-07-12 DIAGNOSIS — C3431 Malignant neoplasm of lower lobe, right bronchus or lung: Secondary | ICD-10-CM | POA: Diagnosis not present

## 2023-07-12 LAB — RAD ONC ARIA SESSION SUMMARY
Course Elapsed Days: 36
Plan Fractions Treated to Date: 24
Plan Prescribed Dose Per Fraction: 2 Gy
Plan Total Fractions Prescribed: 30
Plan Total Prescribed Dose: 60 Gy
Reference Point Dosage Given to Date: 48 Gy
Reference Point Session Dosage Given: 2 Gy
Session Number: 24

## 2023-07-14 ENCOUNTER — Ambulatory Visit
Admission: RE | Admit: 2023-07-14 | Discharge: 2023-07-14 | Disposition: A | Discharge status: Home / Self Care | Payer: BC Managed Care – PPO | Source: Ambulatory Visit | Attending: Radiation Oncology | Admitting: Radiation Oncology

## 2023-07-14 ENCOUNTER — Other Ambulatory Visit: Payer: Self-pay

## 2023-07-14 DIAGNOSIS — C3431 Malignant neoplasm of lower lobe, right bronchus or lung: Secondary | ICD-10-CM | POA: Diagnosis not present

## 2023-07-14 DIAGNOSIS — F17210 Nicotine dependence, cigarettes, uncomplicated: Secondary | ICD-10-CM | POA: Diagnosis not present

## 2023-07-14 DIAGNOSIS — Z510 Encounter for antineoplastic radiation therapy: Secondary | ICD-10-CM | POA: Diagnosis not present

## 2023-07-14 LAB — RAD ONC ARIA SESSION SUMMARY
Course Elapsed Days: 38
Plan Fractions Treated to Date: 25
Plan Prescribed Dose Per Fraction: 2 Gy
Plan Total Fractions Prescribed: 30
Plan Total Prescribed Dose: 60 Gy
Reference Point Dosage Given to Date: 50 Gy
Reference Point Session Dosage Given: 2 Gy
Session Number: 25

## 2023-07-14 NOTE — Progress Notes (Signed)
 [facility] OFFICE PROGRESS NOTE  Benjamine Aland, MD [address], [address] [place] [address]  DIAGNOSIS: Stage IIIb (T4, N2, M0) non-small cell lung cancer, squamous cell carcinoma presented with large right lower lobe lung mass in addition to other right upper lobe pulmonary nodule and right hilar and mediastinal lymphadenopathy in September 2024.   PRIOR THERAPY: None   CURRENT THERAPY:  A course of concurrent chemoradiation with weekly carboplatin  for AUC of 2 and paclitaxel  45 Mg/M2. First dose June 06, 2023. Status post 6 cycles.   INTERVAL HISTORY: Kristy Salazar 83 y.o. female returns to the clinic today for a follow-up visit accompanied by her daughter.  The patient was recently diagnosed with lung cancer.  She is here for her 6th cycle of chemotherapy today.  She has tolerated this well without any significant adverse side effects. Last time I saw her, she had a lot of indigestion/esophagitis from radiation. She received additional maalox and pepcid  last time in the infusion room which significantly improved her symptoms. She was seen by radition oncology 2 weeks ago and had CTA to rule out PE which was negative. She has hycet for pain which helps. She also has carafate . Overall, her symptoms are under better control today.   Her only new symptom that she brought up today was mild bilateral ankle swelling last week. This has resolved at this time. She has been eating a lot of soup. She has compression stockings if needed.  She denies any fever, chills, or night sweats.  The patient did lose some weight since last being seen due to the esophagitis with swallowing.  She does report she has an appetite.  If she overeats, she may feel nauseous and throw up or spits up phlegm. She has nausea medications if needed and they are effective.  She denies any hemoptysis.  She may have a mild intermittent cough that produces white/milky phlegm. This remains unchanged. The patient denies  any significant shortness of breath with exertion.  She still struggles with constipation so she takes stool softener and laxatives. Denies any headache or visual changes.  Her last day radiation is scheduled for 07/26/2023.  She is here today for evaluation repeat blood work before undergoing cycle #7.     MEDICAL HISTORY: Past Medical History:  Diagnosis Date   Arthritis    Asthma    COPD (chronic obstructive pulmonary disease) (HCC)    Diabetes mellitus    patient denies   Hypertension    Seasonal allergies     ALLERGIES:  is allergic to erythromycin.  MEDICATIONS:  Current Outpatient Medications  Medication Sig Dispense Refill   albuterol  (VENTOLIN  HFA) 108 (90 Base) MCG/ACT inhaler Inhale 2 puffs into the lungs every 6 (six) hours as needed for wheezing or shortness of breath. 8 g 2   Fluticasone-Umeclidin-Vilant 100-62.5-25 MCG/INH AEPB Inhale 2 application into the lungs.     furosemide  (LASIX ) 20 MG tablet Take 1-2 tablets in the morning as needed for your foot swelling. 20 tablet 0   HYDROcodone -acetaminophen  (HYCET) 7.5-325 mg/15 ml solution Take 10 mLs by mouth 4 (four) times daily as needed for moderate pain (pain score 4-6). 120 mL 0   lidocaine -prilocaine  (EMLA ) cream Apply to affected area once 30 g 3   montelukast  (SINGULAIR ) 10 MG tablet Take 10 mg by mouth daily. For seasonal allergies     prochlorperazine  (COMPAZINE ) 10 MG tablet Take 1 tablet (10 mg total) by mouth every 6 (six) hours as  needed for nausea or vomiting. 30 tablet 1   rosuvastatin  (CRESTOR ) 10 MG tablet Take 10 mg by mouth daily.       sucralfate  (CARAFATE ) 1 g tablet Take 1 tablet (1 g total) by mouth 4 (four) times daily -  with meals and at bedtime. Crush 1 tablet in 1 oz water and drink 5 min before meals for radiation induced esophagitis 120 tablet 2   tiotropium (SPIRIVA ) 18 MCG inhalation capsule Place 18 mcg into inhaler and inhale daily.     No current facility-administered medications for this  visit.    SURGICAL HISTORY:  Past Surgical History:  Procedure Laterality Date   ABDOMINAL HYSTERECTOMY     APPENDECTOMY     BACK SURGERY     BRONCHIAL BIOPSY  05/17/2023   Procedure: BRONCHIAL BIOPSIES;  Surgeon: Shelah Lamar RAMAN, MD;  Location: [facility] ENDOSCOPY;  Service: Pulmonary;;   BRONCHIAL BRUSHINGS  05/17/2023   Procedure: BRONCHIAL BRUSHINGS;  Surgeon: Shelah Lamar RAMAN, MD;  Location: [facility] ENDOSCOPY;  Service: Pulmonary;;   BRONCHIAL NEEDLE ASPIRATION BIOPSY  05/17/2023   Procedure: BRONCHIAL NEEDLE ASPIRATION BIOPSIES;  Surgeon: Shelah Lamar RAMAN, MD;  Location: [facility] ENDOSCOPY;  Service: Pulmonary;;   IR IMAGING GUIDED PORT INSERTION  06/03/2023   JOINT REPLACEMENT     TOTAL HIP ARTHROPLASTY     TOTAL KNEE ARTHROPLASTY     VIDEO BRONCHOSCOPY WITH ENDOBRONCHIAL ULTRASOUND Right 05/17/2023   Procedure: VIDEO BRONCHOSCOPY WITH ENDOBRONCHIAL ULTRASOUND;  Surgeon: Shelah Lamar RAMAN, MD;  Location: [facility] ENDOSCOPY;  Service: Pulmonary;  Laterality: Right;    REVIEW OF SYSTEMS:   Review of Systems  Constitutional: Positive for weight loss. Negative for appetite change, chills, fatigue, and fever.  HENT: Positive for odynophagia/dysphagia. Negative for mouth sores, nosebleeds.  Eyes: Negative for eye problems and icterus.  Respiratory: Positive for stable intermittent cough and minimal dyspnea on exertion. Negative for  hemoptysis and wheezing.     Cardiovascular: Negative for chest pain and leg swelling (resolved at this time).  Gastrointestinal: Positive for indigestion/heartburn. Positive for intermittent constipation.  Negative for abdominal pain and diarrhea.  Genitourinary: Negative for bladder incontinence, difficulty urinating, dysuria, frequency and hematuria.   Musculoskeletal: Negative for back pain, gait problem, neck pain and neck stiffness.  Skin: Negative for itching and rash.  Neurological: Negative for dizziness, extremity weakness, gait problem, headaches, light-headedness and  seizures.  Hematological: Negative for adenopathy. Does not bruise/bleed easily.  Psychiatric/Behavioral: Negative for confusion, depression and sleep disturbance. The patient is not nervous/anxious.     PHYSICAL EXAMINATION:  Blood pressure (!) 104/59, pulse (!) 104, temperature 98.7 F (37.1 C), temperature source Temporal, resp. rate 16, height 5' 9.5 (1.765 m), weight 107 lb 9.6 oz (48.8 kg), SpO2 95%.  ECOG PERFORMANCE STATUS: 1  Physical Exam  Constitutional: Oriented to person, place, and time and thin appearing female, and in no distress.  HENT:  Head: Normocephalic and atraumatic.  Mouth/Throat: Oropharynx is clear and moist. No oropharyngeal exudate.  Eyes: Conjunctivae are normal. Right eye exhibits no discharge. Left eye exhibits no discharge. No scleral icterus.  Neck: Normal range of motion. Neck supple.  Cardiovascular: Normal rate, regular rhythm, normal heart sounds and intact distal pulses.   Pulmonary/Chest: Effort normal. Quiet breath sounds bilaterally. No respiratory distress. No wheezes. No rales.  Abdominal: Soft. Bowel sounds are normal. Exhibits no distension and no mass. There is no tenderness.  Musculoskeletal: Normal range of motion. Exhibits no edema.  Lymphadenopathy:    No cervical adenopathy.  Neurological: Alert and oriented to person, place, and time. Exhibits muscle wasting. Examined in the wheelchair.  Skin: Skin is warm and dry. No rash noted. Not diaphoretic. No erythema. No pallor.  Psychiatric: Mood, memory and judgment normal.  Vitals reviewed.  LABORATORY DATA: Lab Results  Component Value Date   WBC 4.2 07/18/2023   HGB 11.6 (L) 07/18/2023   HCT 33.6 (L) 07/18/2023   MCV 88.0 07/18/2023   PLT 223 07/18/2023      Chemistry      Component Value Date/Time   NA 136 07/18/2023 0938   K 3.6 07/18/2023 0938   CL 102 07/18/2023 0938   CO2 28 07/18/2023 0938   BUN 13 07/18/2023 0938   CREATININE 0.53 07/18/2023 0938      Component  Value Date/Time   CALCIUM  9.2 07/18/2023 0938   ALKPHOS 78 07/18/2023 0938   AST 17 07/18/2023 0938   ALT 15 07/18/2023 0938   BILITOT 0.3 07/18/2023 0938       RADIOGRAPHIC STUDIES:  CT Angio Chest Pulmonary Embolism (PE) W or WO Contrast Result Date: 07/04/2023 CLINICAL DATA:  Pulmonary embolus suspected with high probability. Non-small cell lung cancer with increasing shortness of breath, tachycardia, hypotension, and tachypnea. EXAM: CT ANGIOGRAPHY CHEST WITH CONTRAST TECHNIQUE: Multidetector CT imaging of the chest was performed using the standard protocol during bolus administration of intravenous contrast. Multiplanar CT image reconstructions and MIPs were obtained to evaluate the vascular anatomy. RADIATION DOSE REDUCTION: This exam was performed according to the departmental dose-optimization program which includes automated exposure control, adjustment of the mA and/or kV according to patient size and/or use of iterative reconstruction technique. CONTRAST:  75mL OMNIPAQUE  IOHEXOL  350 MG/ML SOLN COMPARISON:  PET-CT 05/19/2023.  CT chest 03/31/2023 FINDINGS: Cardiovascular: Technically adequate study with good opacification of the central and segmental pulmonary arteries. No focal filling defects. No evidence of significant pulmonary embolus. Normal heart size. No pericardial effusions. Normal caliber thoracic aorta. No aortic dissection. Great vessel origins are patent. Calcification of the aorta and coronary arteries. Right central vascular catheter with tip in the cavoatrial junction. Mediastinum/Nodes: Esophagus is decompressed. Thyroid  gland is unremarkable. Subcarinal mediastinal lymphadenopathy measuring 1.9 cm short axis dimension. This correlates with metabolically active lymph nodes on prior PET-CT consistent with metastatic disease. Lungs/Pleura: Right lower lung mass measuring 3.4 x 4.8 cm in diameter. This correlates with primary malignancy is demonstrated on prior PET-CT. The mass  appears decreased in size since prior study. There is mild right hilar lymphadenopathy, also decreased. Diffuse emphysematous changes in the lungs. Scattered fibrosis. No pleural effusions. No pneumothorax. Upper Abdomen: No acute abnormalities. Musculoskeletal: Degenerative changes in the spine. No metastatic lesions identified. Review of the MIP images confirms the above findings. IMPRESSION: 1. No evidence of significant pulmonary embolus. 2. Right lower lung mass with metastatic disease in right hilar and mediastinal lymph nodes. Lesions are decreased in size since prior PET-CT which may indicate response to interval therapy. 3. Diffuse emphysematous changes in the lungs. 4. Aortic atherosclerosis. Electronically Signed   By: Elsie Gravely M.D.   On: 07/04/2023 18:48    ASSESSMENT/PLAN:  This is a very pleasant 83 year old African-American female with stage IIIb (T4, N2, M0) non-small cell lung cancer, squamous cell carcinoma.  She presented with a large right lower lobe lung mass in addition to other right upper lobe pulmonary nodules and right hilar mediastinal lymphadenopathy.  She was diagnosed in September 2024.     PDL1 expression 1% per navigator.    She  is scheduled to start concurrent chemoradiation today with weekly carboplatin  for an AUC of 2 paclitaxel  45 mg/m. She started on 06/06/23. She is status post 6 cycles. She has been tolerating the chemotherapy well.    Labs were reviewed. Recommend she proceed with treatment today as scheduled. This will be her last chemotherapy.   I will arrange for a restaging CT scan to be performed about 3 weeks after her last day of radiation. We will then see her back a few days later to review the results and discuss the next steps.   I will put pepcid  and maalox under sign and hold.  She will continue with hycet and carafate  for esophagitis. Let her know PEPcid  and maalox are OTC if ever needed.   The patient was advised to call immediately if  she has any concerning symptoms in the interval. The patient voices understanding of current disease status and treatment options and is in agreement with the current care plan. All questions were answered. The patient knows to call the clinic with any problems, questions or concerns. We can certainly see the patient much sooner if necessary   Orders Placed This Encounter  Procedures   CT Chest W Contrast    Standing Status:   Future    Expected Date:   08/15/2023    Expiration Date:   07/17/2024    If indicated for the ordered procedure, I authorize the administration of contrast media per Radiology protocol:   Yes    Does the patient have a contrast media/X-ray dye allergy?:   No    Preferred imaging location?:   [facility]     The total time spent in the appointment was 20-29 minutes  Shamond Skelton L Amerie Beaumont, PA-C 07/18/23

## 2023-07-15 ENCOUNTER — Ambulatory Visit
Admission: RE | Admit: 2023-07-15 | Discharge: 2023-07-15 | Disposition: A | Discharge status: Home / Self Care | Payer: BC Managed Care – PPO | Source: Ambulatory Visit | Attending: Radiation Oncology | Admitting: Radiation Oncology

## 2023-07-15 ENCOUNTER — Other Ambulatory Visit: Payer: Self-pay | Admitting: Radiation Oncology

## 2023-07-15 ENCOUNTER — Other Ambulatory Visit: Payer: Self-pay

## 2023-07-15 DIAGNOSIS — C3431 Malignant neoplasm of lower lobe, right bronchus or lung: Secondary | ICD-10-CM | POA: Diagnosis not present

## 2023-07-15 DIAGNOSIS — Z510 Encounter for antineoplastic radiation therapy: Secondary | ICD-10-CM | POA: Diagnosis not present

## 2023-07-15 DIAGNOSIS — F17210 Nicotine dependence, cigarettes, uncomplicated: Secondary | ICD-10-CM | POA: Diagnosis not present

## 2023-07-15 LAB — RAD ONC ARIA SESSION SUMMARY
Course Elapsed Days: 39
Plan Fractions Treated to Date: 26
Plan Prescribed Dose Per Fraction: 2 Gy
Plan Total Fractions Prescribed: 30
Plan Total Prescribed Dose: 60 Gy
Reference Point Dosage Given to Date: 52 Gy
Reference Point Session Dosage Given: 2 Gy
Session Number: 26

## 2023-07-15 MED ORDER — HYDROCODONE-ACETAMINOPHEN 7.5-325 MG/15ML PO SOLN: 10.0000 mL | Freq: Four times a day (QID) | ORAL | 0 refills | Status: DC | PRN

## 2023-07-18 ENCOUNTER — Ambulatory Visit
Admission: RE | Admit: 2023-07-18 | Discharge: 2023-07-18 | Disposition: A | Discharge status: Home / Self Care | Payer: BC Managed Care – PPO | Source: Ambulatory Visit | Attending: Radiation Oncology | Admitting: Radiation Oncology

## 2023-07-18 ENCOUNTER — Inpatient Hospital Stay: Payer: BC Managed Care – PPO

## 2023-07-18 ENCOUNTER — Inpatient Hospital Stay (HOSPITAL_BASED_OUTPATIENT_CLINIC_OR_DEPARTMENT_OTHER): Payer: BC Managed Care – PPO | Admitting: Physician Assistant

## 2023-07-18 ENCOUNTER — Other Ambulatory Visit: Payer: Self-pay

## 2023-07-18 VITALS — BP 104/59 | HR 104 | Temp 98.7°F | Resp 16 | Ht 69.5 in | Wt 107.6 lb

## 2023-07-18 VITALS — BP 138/85 | HR 93 | Resp 18

## 2023-07-18 DIAGNOSIS — Z510 Encounter for antineoplastic radiation therapy: Secondary | ICD-10-CM | POA: Diagnosis not present

## 2023-07-18 DIAGNOSIS — K209 Esophagitis, unspecified without bleeding: Secondary | ICD-10-CM

## 2023-07-18 DIAGNOSIS — C3431 Malignant neoplasm of lower lobe, right bronchus or lung: Secondary | ICD-10-CM | POA: Diagnosis not present

## 2023-07-18 DIAGNOSIS — F17210 Nicotine dependence, cigarettes, uncomplicated: Secondary | ICD-10-CM | POA: Diagnosis not present

## 2023-07-18 DIAGNOSIS — Z95828 Presence of other vascular implants and grafts: Secondary | ICD-10-CM

## 2023-07-18 DIAGNOSIS — Z5111 Encounter for antineoplastic chemotherapy: Secondary | ICD-10-CM | POA: Insufficient documentation

## 2023-07-18 DIAGNOSIS — K208 Other esophagitis without bleeding: Secondary | ICD-10-CM | POA: Insufficient documentation

## 2023-07-18 LAB — CBC WITH DIFFERENTIAL (CANCER CENTER ONLY)
Abs Immature Granulocytes: 0.02 10*3/uL (ref 0.00–0.07)
Basophils Absolute: 0 10*3/uL (ref 0.0–0.1)
Basophils Relative: 0 %
Eosinophils Absolute: 0 10*3/uL (ref 0.0–0.5)
Eosinophils Relative: 0 %
HCT: 33.6 % — ABNORMAL LOW (ref 36.0–46.0)
Hemoglobin: 11.6 g/dL — ABNORMAL LOW (ref 12.0–15.0)
Immature Granulocytes: 1 %
Lymphocytes Relative: 5 %
Lymphs Abs: 0.2 10*3/uL — ABNORMAL LOW (ref 0.7–4.0)
MCH: 30.4 pg (ref 26.0–34.0)
MCHC: 34.5 g/dL (ref 30.0–36.0)
MCV: 88 fL (ref 80.0–100.0)
Monocytes Absolute: 0.5 10*3/uL (ref 0.1–1.0)
Monocytes Relative: 11 %
Neutro Abs: 3.5 10*3/uL (ref 1.7–7.7)
Neutrophils Relative %: 83 %
Platelet Count: 223 10*3/uL (ref 150–400)
RBC: 3.82 MIL/uL — ABNORMAL LOW (ref 3.87–5.11)
RDW: 15 % (ref 11.5–15.5)
WBC Count: 4.2 10*3/uL (ref 4.0–10.5)
nRBC: 0 % (ref 0.0–0.2)

## 2023-07-18 LAB — RAD ONC ARIA SESSION SUMMARY
Course Elapsed Days: 42
Plan Fractions Treated to Date: 27
Plan Prescribed Dose Per Fraction: 2 Gy
Plan Total Fractions Prescribed: 30
Plan Total Prescribed Dose: 60 Gy
Reference Point Dosage Given to Date: 54 Gy
Reference Point Session Dosage Given: 2 Gy
Session Number: 27

## 2023-07-18 LAB — CMP (CANCER CENTER ONLY)
ALT: 15 U/L (ref 0–44)
AST: 17 U/L (ref 15–41)
Albumin: 3.5 g/dL (ref 3.5–5.0)
Alkaline Phosphatase: 78 U/L (ref 38–126)
Anion gap: 6 (ref 5–15)
BUN: 13 mg/dL (ref 8–23)
CO2: 28 mmol/L (ref 22–32)
Calcium: 9.2 mg/dL (ref 8.9–10.3)
Chloride: 102 mmol/L (ref 98–111)
Creatinine: 0.53 mg/dL (ref 0.44–1.00)
GFR, Estimated: >60 mL/min (ref >60)
Glucose, Bld: 118 mg/dL — ABNORMAL HIGH (ref 70–99)
Potassium: 3.6 mmol/L (ref 3.5–5.1)
Sodium: 136 mmol/L (ref 135–145)
Total Bilirubin: 0.3 mg/dL (ref 0.0–1.2)
Total Protein: 7 g/dL (ref 6.5–8.1)

## 2023-07-18 MED ORDER — ALUM & MAG HYDROXIDE-SIMETH 200-200-20 MG/5ML PO SUSP
15.0000 mL | Freq: Once | ORAL | Status: AC
  Administered 2023-07-18: 15 mL via ORAL
  Filled 2023-07-18: qty 30

## 2023-07-18 MED ORDER — HEPARIN SOD (PORK) LOCK FLUSH 100 UNIT/ML IV SOLN
500.0000 [IU] | Freq: Once | INTRAVENOUS | Status: AC | PRN
  Administered 2023-07-18: 500 [IU]

## 2023-07-18 MED ORDER — CETIRIZINE HCL 10 MG/ML IV SOLN
10.0000 mg | Freq: Once | INTRAVENOUS | Status: AC
Start: 2023-07-18 — End: 2023-07-18
  Administered 2023-07-18: 10 mg via INTRAVENOUS
  Filled 2023-07-18: qty 1

## 2023-07-18 MED ORDER — SODIUM CHLORIDE 0.9 % IV SOLN
124.4000 mg | Freq: Once | INTRAVENOUS | Status: AC
  Administered 2023-07-18: 120 mg via INTRAVENOUS
  Filled 2023-07-18: qty 12

## 2023-07-18 MED ORDER — FAMOTIDINE 20 MG PO TABS
20.0000 mg | ORAL_TABLET | Freq: Once | ORAL | Status: AC
  Administered 2023-07-18: 20 mg via ORAL
  Filled 2023-07-18: qty 1

## 2023-07-18 MED ORDER — FAMOTIDINE IN NACL 20-0.9 MG/50ML-% IV SOLN
20.0000 mg | Freq: Once | INTRAVENOUS | Status: AC
  Administered 2023-07-18: 20 mg via INTRAVENOUS
  Filled 2023-07-18: qty 50

## 2023-07-18 MED ORDER — PALONOSETRON HCL INJECTION 0.25 MG/5ML
0.2500 mg | Freq: Once | INTRAVENOUS | Status: AC
  Administered 2023-07-18: 0.25 mg via INTRAVENOUS
  Filled 2023-07-18: qty 5

## 2023-07-18 MED ORDER — DEXAMETHASONE SODIUM PHOSPHATE 10 MG/ML IJ SOLN
10.0000 mg | Freq: Once | INTRAMUSCULAR | Status: AC
  Administered 2023-07-18: 10 mg via INTRAVENOUS
  Filled 2023-07-18: qty 1

## 2023-07-18 MED ORDER — SODIUM CHLORIDE 0.9 % IV SOLN
45.0000 mg/m2 | Freq: Once | INTRAVENOUS | Status: AC
  Administered 2023-07-18: 72 mg via INTRAVENOUS
  Filled 2023-07-18: qty 12

## 2023-07-18 MED ORDER — SODIUM CHLORIDE 0.9% FLUSH
10.0000 mL | Freq: Once | INTRAVENOUS | Status: AC
  Administered 2023-07-18: 10 mL

## 2023-07-18 MED ORDER — SODIUM CHLORIDE 0.9 % IV SOLN: INTRAVENOUS | Status: DC

## 2023-07-18 MED ORDER — SODIUM CHLORIDE 0.9% FLUSH
10.0000 mL | INTRAVENOUS | Status: DC | PRN
  Administered 2023-07-18: 10 mL

## 2023-07-18 NOTE — Patient Instructions (Signed)

## 2023-07-19 ENCOUNTER — Ambulatory Visit
Admission: RE | Admit: 2023-07-19 | Discharge: 2023-07-19 | Disposition: A | Discharge status: Home / Self Care | Payer: BC Managed Care – PPO | Source: Ambulatory Visit | Attending: Radiation Oncology | Admitting: Radiation Oncology

## 2023-07-19 ENCOUNTER — Other Ambulatory Visit: Payer: Self-pay

## 2023-07-19 DIAGNOSIS — F17210 Nicotine dependence, cigarettes, uncomplicated: Secondary | ICD-10-CM | POA: Diagnosis not present

## 2023-07-19 DIAGNOSIS — C3431 Malignant neoplasm of lower lobe, right bronchus or lung: Secondary | ICD-10-CM | POA: Diagnosis not present

## 2023-07-19 DIAGNOSIS — Z510 Encounter for antineoplastic radiation therapy: Secondary | ICD-10-CM | POA: Diagnosis not present

## 2023-07-19 LAB — RAD ONC ARIA SESSION SUMMARY
Course Elapsed Days: 43
Plan Fractions Treated to Date: 28
Plan Prescribed Dose Per Fraction: 2 Gy
Plan Total Fractions Prescribed: 30
Plan Total Prescribed Dose: 60 Gy
Reference Point Dosage Given to Date: 56 Gy
Reference Point Session Dosage Given: 2 Gy
Session Number: 28

## 2023-07-20 ENCOUNTER — Telehealth: Payer: Self-pay | Admitting: Physician Assistant

## 2023-07-20 ENCOUNTER — Ambulatory Visit
Admission: RE | Admit: 2023-07-20 | Discharge: 2023-07-20 | Disposition: A | Discharge status: Home / Self Care | Payer: BC Managed Care – PPO | Source: Ambulatory Visit | Attending: Radiation Oncology | Admitting: Radiation Oncology

## 2023-07-20 ENCOUNTER — Other Ambulatory Visit: Payer: Self-pay

## 2023-07-20 DIAGNOSIS — Z510 Encounter for antineoplastic radiation therapy: Secondary | ICD-10-CM | POA: Diagnosis not present

## 2023-07-20 DIAGNOSIS — C3431 Malignant neoplasm of lower lobe, right bronchus or lung: Secondary | ICD-10-CM | POA: Diagnosis not present

## 2023-07-20 DIAGNOSIS — F17210 Nicotine dependence, cigarettes, uncomplicated: Secondary | ICD-10-CM | POA: Diagnosis not present

## 2023-07-20 LAB — RAD ONC ARIA SESSION SUMMARY
Course Elapsed Days: 44
Plan Fractions Treated to Date: 29
Plan Prescribed Dose Per Fraction: 2 Gy
Plan Total Fractions Prescribed: 30
Plan Total Prescribed Dose: 60 Gy
Reference Point Dosage Given to Date: 58 Gy
Reference Point Session Dosage Given: 2 Gy
Session Number: 29

## 2023-07-21 ENCOUNTER — Ambulatory Visit
Admission: RE | Admit: 2023-07-21 | Discharge: 2023-07-21 | Disposition: A | Discharge status: Home / Self Care | Payer: BC Managed Care – PPO | Source: Ambulatory Visit | Attending: Radiation Oncology | Admitting: Radiation Oncology

## 2023-07-21 ENCOUNTER — Other Ambulatory Visit: Payer: Self-pay

## 2023-07-21 DIAGNOSIS — F17210 Nicotine dependence, cigarettes, uncomplicated: Secondary | ICD-10-CM | POA: Diagnosis not present

## 2023-07-21 DIAGNOSIS — Z510 Encounter for antineoplastic radiation therapy: Secondary | ICD-10-CM | POA: Diagnosis not present

## 2023-07-21 DIAGNOSIS — C3431 Malignant neoplasm of lower lobe, right bronchus or lung: Secondary | ICD-10-CM | POA: Diagnosis not present

## 2023-07-21 LAB — RAD ONC ARIA SESSION SUMMARY
Course Elapsed Days: 45
Plan Fractions Treated to Date: 30
Plan Prescribed Dose Per Fraction: 2 Gy
Plan Total Fractions Prescribed: 30
Plan Total Prescribed Dose: 60 Gy
Reference Point Dosage Given to Date: 60 Gy
Reference Point Session Dosage Given: 2 Gy
Session Number: 30

## 2023-07-22 ENCOUNTER — Ambulatory Visit
Admission: RE | Admit: 2023-07-22 | Discharge: 2023-07-22 | Disposition: A | Discharge status: Home / Self Care | Payer: BC Managed Care – PPO | Source: Ambulatory Visit | Attending: Radiation Oncology | Admitting: Radiation Oncology

## 2023-07-22 ENCOUNTER — Other Ambulatory Visit: Payer: Self-pay

## 2023-07-22 DIAGNOSIS — C3431 Malignant neoplasm of lower lobe, right bronchus or lung: Secondary | ICD-10-CM | POA: Diagnosis not present

## 2023-07-22 DIAGNOSIS — F17210 Nicotine dependence, cigarettes, uncomplicated: Secondary | ICD-10-CM | POA: Diagnosis not present

## 2023-07-22 DIAGNOSIS — Z510 Encounter for antineoplastic radiation therapy: Secondary | ICD-10-CM | POA: Diagnosis not present

## 2023-07-22 LAB — RAD ONC ARIA SESSION SUMMARY
Course Elapsed Days: 46
Plan Fractions Treated to Date: 1
Plan Prescribed Dose Per Fraction: 2 Gy
Plan Total Fractions Prescribed: 3
Plan Total Prescribed Dose: 6 Gy
Reference Point Dosage Given to Date: 2 Gy
Reference Point Session Dosage Given: 2 Gy
Session Number: 31

## 2023-07-25 ENCOUNTER — Other Ambulatory Visit: Payer: Self-pay

## 2023-07-25 ENCOUNTER — Ambulatory Visit
Admission: RE | Admit: 2023-07-25 | Discharge: 2023-07-25 | Disposition: A | Discharge status: Home / Self Care | Payer: BC Managed Care – PPO | Source: Ambulatory Visit | Attending: Radiation Oncology | Admitting: Radiation Oncology

## 2023-07-25 DIAGNOSIS — C3431 Malignant neoplasm of lower lobe, right bronchus or lung: Secondary | ICD-10-CM | POA: Diagnosis not present

## 2023-07-25 LAB — RAD ONC ARIA SESSION SUMMARY
Course Elapsed Days: 49
Plan Fractions Treated to Date: 2
Plan Prescribed Dose Per Fraction: 2 Gy
Plan Total Fractions Prescribed: 3
Plan Total Prescribed Dose: 6 Gy
Reference Point Dosage Given to Date: 4 Gy
Reference Point Session Dosage Given: 2 Gy
Session Number: 32

## 2023-07-26 ENCOUNTER — Other Ambulatory Visit: Payer: Self-pay

## 2023-07-26 ENCOUNTER — Ambulatory Visit
Admission: RE | Admit: 2023-07-26 | Discharge: 2023-07-26 | Disposition: A | Discharge status: Home / Self Care | Payer: BC Managed Care – PPO | Source: Ambulatory Visit | Attending: Radiation Oncology | Admitting: Radiation Oncology

## 2023-07-26 DIAGNOSIS — C3431 Malignant neoplasm of lower lobe, right bronchus or lung: Secondary | ICD-10-CM | POA: Diagnosis not present

## 2023-07-26 DIAGNOSIS — F17210 Nicotine dependence, cigarettes, uncomplicated: Secondary | ICD-10-CM | POA: Diagnosis not present

## 2023-07-26 DIAGNOSIS — Z510 Encounter for antineoplastic radiation therapy: Secondary | ICD-10-CM | POA: Diagnosis not present

## 2023-07-26 LAB — RAD ONC ARIA SESSION SUMMARY
Course Elapsed Days: 50
Plan Fractions Treated to Date: 3
Plan Prescribed Dose Per Fraction: 2 Gy
Plan Total Fractions Prescribed: 3
Plan Total Prescribed Dose: 6 Gy
Reference Point Dosage Given to Date: 6 Gy
Reference Point Session Dosage Given: 2 Gy
Session Number: 33

## 2023-07-27 NOTE — Radiation Completion Notes (Addendum)
  Radiation Oncology         [phone] ________________________________  Name: GLENDER AUGUSTA MRN: [id number]  Date of Service: 07/26/2023  DOB: 05/30/1941  End of Treatment Note    Diagnosis: Stage IIIB, cT4N2M0, NSCLC, squamous cell carcinoma of the right lower lobe   Intent: Curative     ==========DELIVERED PLANS==========  First Treatment Date: 2023-06-06 Last Treatment Date: 2023-07-26   Plan Name: Lung_R Site: Lung, Right Technique: 3D Mode: Photon Dose Per Fraction: 2 Gy Prescribed Dose (Delivered / Prescribed): 60 Gy / 60 Gy Prescribed Fxs (Delivered / Prescribed): 30 / 30   Plan Name: Lung_R_Bst Site: Lung, Right Technique: 3D Mode: Photon Dose Per Fraction: 2 Gy Prescribed Dose (Delivered / Prescribed): 6 Gy / 6 Gy Prescribed Fxs (Delivered / Prescribed): 3 / 3     ==========ON TREATMENT VISIT DATES========== 2023-06-13, 2023-06-17, 2023-06-24, 2023-07-01, 2023-07-08, 2023-07-15, 2023-07-22   See weekly On Treatment Notes in Epic for details in the Media tab (listed as Progress notes on the On Treatment Visit Dates listed above). The patient tolerated radiation. She did not complain of fatigue, skin changes, or esophagitis at the conclusion of her treatment.  The patient will receive a call in about one month from the radiation oncology department. She will continue follow up with Dr. Sherrod as well.      Donald KYM Husband, PAC

## 2023-07-28 ENCOUNTER — Other Ambulatory Visit: Payer: Self-pay

## 2023-08-12 ENCOUNTER — Other Ambulatory Visit: Payer: Self-pay | Admitting: Physician Assistant

## 2023-08-12 DIAGNOSIS — C3431 Malignant neoplasm of lower lobe, right bronchus or lung: Secondary | ICD-10-CM

## 2023-08-15 ENCOUNTER — Ambulatory Visit (HOSPITAL_COMMUNITY)
Admission: RE | Admit: 2023-08-15 | Discharge: 2023-08-15 | Disposition: A | Discharge status: Home / Self Care | Payer: BC Managed Care – PPO | Source: Ambulatory Visit | Attending: Physician Assistant | Admitting: Physician Assistant

## 2023-08-15 ENCOUNTER — Other Ambulatory Visit: Payer: Self-pay | Admitting: Physician Assistant

## 2023-08-15 ENCOUNTER — Inpatient Hospital Stay: Payer: BC Managed Care – PPO | Attending: Internal Medicine

## 2023-08-15 ENCOUNTER — Telehealth: Payer: Self-pay

## 2023-08-15 DIAGNOSIS — Z95828 Presence of other vascular implants and grafts: Secondary | ICD-10-CM

## 2023-08-15 DIAGNOSIS — Z5112 Encounter for antineoplastic immunotherapy: Secondary | ICD-10-CM | POA: Insufficient documentation

## 2023-08-15 DIAGNOSIS — Z9221 Personal history of antineoplastic chemotherapy: Secondary | ICD-10-CM | POA: Diagnosis not present

## 2023-08-15 DIAGNOSIS — Z923 Personal history of irradiation: Secondary | ICD-10-CM | POA: Insufficient documentation

## 2023-08-15 DIAGNOSIS — C3431 Malignant neoplasm of lower lobe, right bronchus or lung: Secondary | ICD-10-CM | POA: Diagnosis present

## 2023-08-15 DIAGNOSIS — R918 Other nonspecific abnormal finding of lung field: Secondary | ICD-10-CM | POA: Diagnosis not present

## 2023-08-15 DIAGNOSIS — I959 Hypotension, unspecified: Secondary | ICD-10-CM | POA: Diagnosis not present

## 2023-08-15 DIAGNOSIS — R590 Localized enlarged lymph nodes: Secondary | ICD-10-CM | POA: Insufficient documentation

## 2023-08-15 DIAGNOSIS — E876 Hypokalemia: Secondary | ICD-10-CM

## 2023-08-15 DIAGNOSIS — Z79620 Long term (current) use of immunosuppressive biologic: Secondary | ICD-10-CM | POA: Diagnosis not present

## 2023-08-15 LAB — CBC WITH DIFFERENTIAL (CANCER CENTER ONLY)
Abs Immature Granulocytes: 0.08 10*3/uL — ABNORMAL HIGH (ref 0.00–0.07)
Basophils Absolute: 0 10*3/uL (ref 0.0–0.1)
Basophils Relative: 1 %
Eosinophils Absolute: 0 10*3/uL (ref 0.0–0.5)
Eosinophils Relative: 1 %
HCT: 33.7 % — ABNORMAL LOW (ref 36.0–46.0)
Hemoglobin: 11.3 g/dL — ABNORMAL LOW (ref 12.0–15.0)
Immature Granulocytes: 2 %
Lymphocytes Relative: 31 %
Lymphs Abs: 1.1 10*3/uL (ref 0.7–4.0)
MCH: 30.9 pg (ref 26.0–34.0)
MCHC: 33.5 g/dL (ref 30.0–36.0)
MCV: 92.1 fL (ref 80.0–100.0)
Monocytes Absolute: 0.4 10*3/uL (ref 0.1–1.0)
Monocytes Relative: 12 %
Neutro Abs: 1.9 10*3/uL (ref 1.7–7.7)
Neutrophils Relative %: 53 %
Platelet Count: 469 10*3/uL — ABNORMAL HIGH (ref 150–400)
RBC: 3.66 MIL/uL — ABNORMAL LOW (ref 3.87–5.11)
RDW: 17.4 % — ABNORMAL HIGH (ref 11.5–15.5)
WBC Count: 3.5 10*3/uL — ABNORMAL LOW (ref 4.0–10.5)
nRBC: 0 % (ref 0.0–0.2)

## 2023-08-15 LAB — CMP (CANCER CENTER ONLY)
ALT: 24 U/L (ref 0–44)
AST: 47 U/L — ABNORMAL HIGH (ref 15–41)
Albumin: 3.1 g/dL — ABNORMAL LOW (ref 3.5–5.0)
Alkaline Phosphatase: 123 U/L (ref 38–126)
Anion gap: 7 (ref 5–15)
BUN: 17 mg/dL (ref 8–23)
CO2: 28 mmol/L (ref 22–32)
Calcium: 8.8 mg/dL — ABNORMAL LOW (ref 8.9–10.3)
Chloride: 102 mmol/L (ref 98–111)
Creatinine: 0.64 mg/dL (ref 0.44–1.00)
GFR, Estimated: >60 mL/min (ref >60)
Glucose, Bld: 138 mg/dL — ABNORMAL HIGH (ref 70–99)
Potassium: 3.1 mmol/L — ABNORMAL LOW (ref 3.5–5.1)
Sodium: 137 mmol/L (ref 135–145)
Total Bilirubin: 0.5 mg/dL (ref 0.0–1.2)
Total Protein: 7.1 g/dL (ref 6.5–8.1)

## 2023-08-15 MED ORDER — POTASSIUM CHLORIDE CRYS ER 20 MEQ PO TBCR: 20.0000 meq | EXTENDED_RELEASE_TABLET | Freq: Every day | ORAL | 0 refills | Status: DC

## 2023-08-15 MED ORDER — HEPARIN SOD (PORK) LOCK FLUSH 100 UNIT/ML IV SOLN
500.0000 [IU] | Freq: Once | INTRAVENOUS | Status: AC
  Administered 2023-08-15: 500 [IU] via INTRAVENOUS

## 2023-08-15 MED ORDER — HEPARIN SOD (PORK) LOCK FLUSH 100 UNIT/ML IV SOLN
INTRAVENOUS | Status: AC
  Filled 2023-08-15: qty 5

## 2023-08-15 MED ORDER — IOHEXOL 300 MG/ML  SOLN
75.0000 mL | Freq: Once | INTRAMUSCULAR | Status: AC | PRN
  Administered 2023-08-15: 75 mL via INTRAVENOUS

## 2023-08-15 MED ORDER — SODIUM CHLORIDE 0.9% FLUSH
10.0000 mL | Freq: Once | INTRAVENOUS | Status: AC
  Administered 2023-08-15: 10 mL

## 2023-08-15 NOTE — Telephone Encounter (Signed)
Pt advised of potassium of 3.1 and a prescription has been sent to her pharmacy

## 2023-08-15 NOTE — Telephone Encounter (Signed)
-----   Message from Cassandra L Heilingoetter sent at 08/15/2023 12:30 PM EST ----- Can you let her know that I sent potassium to the pharmacy to take once daily for 1 week ----- Message ----- From: Interface, Lab In [place] Sent: 08/15/2023   8:09 AM EST To: Cassandra L Heilingoetter, PA-C

## 2023-08-16 NOTE — Progress Notes (Signed)
 [facility] OFFICE PROGRESS NOTE  [name], MD [address], [address] [place] [address]  DIAGNOSIS: Stage IIIb (T4, N2, M0) non-small cell lung cancer, squamous cell carcinoma presented with large right lower lobe lung mass in addition to other right upper lobe pulmonary nodule and right hilar and mediastinal lymphadenopathy in September 2024.   PRIOR THERAPY: A course of concurrent chemoradiation with weekly carboplatin  for AUC of 2 and paclitaxel  45 Mg/M2. First dose June 06, 2023. Status post 6 cycles.   CURRENT THERAPY: Consolidation with Imfinzi  1500 mg IV every 4 weeks.  First dose expected on 08/29/2023.   INTERVAL HISTORY: Kristy Salazar 83 y.o. female returns to the clinic today for a follow-up visit accompanied by her daughter.  The patient was last seen in the clinic on 07/18/2023.  The patient had completed her course of concurrent chemoradiation.  Her main concern is related to decreased appetite and weight loss.  She lost a lot of weight since she was last seen.  Denies any dysphagia or odynophagia at this time.  She still using Hycet and Carafate .  She states that she is "working on eating.  She saw her PCP a few weeks ago who started her on an appetite stimulant which may potentially be Megace.  She also reports taste alterations.  She is drinking Ensure twice a day or so. She has not been taking [name] at home. She is a little hypotensive today.  She does not take any antihypertensive medications.  She sometimes feels lightheaded.  She uses a cane for ambulation and has assistive devices at home if needed.  The patient still works somewhat and is currently on short-term disability and she is wondering if this can be extended for another 4 weeks while she continues to recover.  Today she denies any fever, chills, or night sweats.  She denies any vomiting.  She denies any significant shortness of breath. She reports similar intermittent cough that produces white/milky  phlegm.  Denies any hemoptysis or chest pain.  She struggles with frequent constipation for which she takes stool softener and laxatives.  Denies any headache or visual changes.  She recently had a restaging CT scan performed.  She is here today for evaluation and to review her scan results and discuss the next steps in her care.    MEDICAL HISTORY: Past Medical History:  Diagnosis Date   Arthritis    Asthma    COPD (chronic obstructive pulmonary disease) (HCC)    Diabetes mellitus    patient denies   Hypertension    Seasonal allergies     ALLERGIES:  is allergic to erythromycin.  MEDICATIONS:  Current Outpatient Medications  Medication Sig Dispense Refill   albuterol  (VENTOLIN  HFA) 108 (90 Base) MCG/ACT inhaler Inhale 2 puffs into the lungs every 6 (six) hours as needed for wheezing or shortness of breath. 8 g 2   Fluticasone-Umeclidin-Vilant 100-62.5-25 MCG/INH AEPB Inhale 2 application into the lungs.     furosemide  (LASIX ) 20 MG tablet Take 1-2 tablets in the morning as needed for your foot swelling. 20 tablet 0   HYDROcodone -acetaminophen  (HYCET) 7.5-325 mg/15 ml solution Take 10 mLs by mouth 4 (four) times daily as needed for moderate pain (pain score 4-6). 120 mL 0   montelukast  (SINGULAIR ) 10 MG tablet Take 10 mg by mouth daily. For seasonal allergies     potassium chloride  SA (KLOR-CON  M) 20 MEQ tablet Take 1 tablet (20 mEq total) by mouth daily. 7  tablet 0   rosuvastatin  (CRESTOR ) 10 MG tablet Take 10 mg by mouth daily.       sucralfate  (CARAFATE ) 1 g tablet Take 1 tablet (1 g total) by mouth 4 (four) times daily -  with meals and at bedtime. Crush 1 tablet in 1 oz water and drink 5 min before meals for radiation induced esophagitis 120 tablet 2   tiotropium (SPIRIVA ) 18 MCG inhalation capsule Place 18 mcg into inhaler and inhale daily.     No current facility-administered medications for this visit.   Facility-Administered Medications Ordered in Other Visits  Medication  Dose Route Frequency Provider Last Rate Last Admin   0.9 %  sodium chloride  infusion   Intravenous Continuous Allyn Bertoni L, PA-C        SURGICAL HISTORY:  Past Surgical History:  Procedure Laterality Date   ABDOMINAL HYSTERECTOMY     APPENDECTOMY     BACK SURGERY     BRONCHIAL BIOPSY  05/17/2023   Procedure: BRONCHIAL BIOPSIES;  Surgeon: Denson Flake, MD;  Location: [facility] ENDOSCOPY;  Service: Pulmonary;;   BRONCHIAL BRUSHINGS  05/17/2023   Procedure: BRONCHIAL BRUSHINGS;  Surgeon: Denson Flake, MD;  Location: [facility];  Service: Pulmonary;;   BRONCHIAL NEEDLE ASPIRATION BIOPSY  05/17/2023   Procedure: BRONCHIAL NEEDLE ASPIRATION BIOPSIES;  Surgeon: Denson Flake, MD;  Location: [facility] ENDOSCOPY;  Service: Pulmonary;;   IR IMAGING GUIDED PORT INSERTION  06/03/2023   JOINT REPLACEMENT     TOTAL HIP ARTHROPLASTY     TOTAL KNEE ARTHROPLASTY     VIDEO BRONCHOSCOPY WITH ENDOBRONCHIAL ULTRASOUND Right 05/17/2023   Procedure: VIDEO BRONCHOSCOPY WITH ENDOBRONCHIAL ULTRASOUND;  Surgeon: Denson Flake, MD;  Location: [facility] ENDOSCOPY;  Service: Pulmonary;  Laterality: Right;    REVIEW OF SYSTEMS:   Review of Systems  Constitutional: Positive for appetite change, generalized weakness, and weight loss.  Negative for chills and fever.  HENT: Negative for mouth sores, nosebleeds, sore throat and trouble swallowing.   Eyes: Negative for eye problems and icterus.  Respiratory: Positive for stable intermittent cough and minimal dyspnea on exertion. Negative for  hemoptysis and wheezing.   Cardiovascular: Negative for chest pain and leg swelling.  Gastrointestinal: Negative for abdominal pain, constipation (baseline-manages with laxatives and stool softener), diarrhea, nausea and vomiting.  Genitourinary: Negative for bladder incontinence, difficulty urinating, dysuria, frequency and hematuria.   Musculoskeletal: Negative for back pain, gait problem, neck pain and neck stiffness.  Skin:  Negative for itching and rash.  Neurological: Positive for occasional lightheadedness. Negative for dizziness, extremity weakness, gait problem, headaches, and seizures.  Hematological: Negative for adenopathy. Does not bruise/bleed easily.  Psychiatric/Behavioral: Negative for confusion, depression and sleep disturbance. The patient is not nervous/anxious.     PHYSICAL EXAMINATION:  Blood pressure 90/70, pulse (!) 105, temperature 98.5 F (36.9 C), temperature source Temporal, resp. rate 18, weight 93 lb 4.8 oz (42.3 kg), SpO2 98%.  ECOG PERFORMANCE STATUS: 1  Physical Exam  Constitutional: Oriented to person, place, and time and thin appearing female, and in no distress.  HENT:  Head: Normocephalic and atraumatic.  Mouth/Throat: Oropharynx is clear and moist. No oropharyngeal exudate.  Eyes: Conjunctivae are normal. Right eye exhibits no discharge. Left eye exhibits no discharge. No scleral icterus.  Neck: Normal range of motion. Neck supple.  Cardiovascular: Normal rate, regular rhythm, normal heart sounds and intact distal pulses.   Pulmonary/Chest: Effort normal. Quiet breath sounds bilaterally. No respiratory distress. No wheezes. No rales.  Abdominal: Soft. Bowel sounds  are normal. Exhibits no distension and no mass. There is no tenderness.  Musculoskeletal: Normal range of motion. Exhibits no edema.  Lymphadenopathy:    No cervical adenopathy.  Neurological: Alert and oriented to person, place, and time. Exhibits muscle wasting. Examined in the wheelchair.  Skin: Skin is warm and dry. No rash noted. Not diaphoretic. No erythema. No pallor.  Psychiatric: Mood, memory and judgment normal.  Vitals reviewed.  LABORATORY DATA: Lab Results  Component Value Date   WBC 3.5 (L) 08/15/2023   HGB 11.3 (L) 08/15/2023   HCT 33.7 (L) 08/15/2023   MCV 92.1 08/15/2023   PLT 469 (H) 08/15/2023      Chemistry      Component Value Date/Time   NA 137 08/15/2023 0748   K 3.1 (L)  08/15/2023 0748   CL 102 08/15/2023 0748   CO2 28 08/15/2023 0748   BUN 17 08/15/2023 0748   CREATININE 0.64 08/15/2023 0748      Component Value Date/Time   CALCIUM  8.8 (L) 08/15/2023 0748   ALKPHOS 123 08/15/2023 0748   AST 47 (H) 08/15/2023 0748   ALT 24 08/15/2023 0748   BILITOT 0.5 08/15/2023 0748       RADIOGRAPHIC STUDIES:  CT Chest W Contrast Result Date: 08/22/2023 CLINICAL DATA:  83 year old female with history of non-small cell lung cancer (squamous cell carcinoma of the right lower lobe). Follow-up study. * Tracking Code: BO * EXAM: CT CHEST WITH CONTRAST TECHNIQUE: Multidetector CT imaging of the chest was performed during intravenous contrast administration. RADIATION DOSE REDUCTION: This exam was performed according to the departmental dose-optimization program which includes automated exposure control, adjustment of the mA and/or kV according to patient size and/or use of iterative reconstruction technique. CONTRAST:  75mL OMNIPAQUE  IOHEXOL  300 MG/ML  SOLN COMPARISON:  Chest CTA 07/04/2023. FINDINGS: Cardiovascular: Heart size is normal. There is no significant pericardial fluid, thickening or pericardial calcification. There is aortic atherosclerosis, as well as atherosclerosis of the great vessels of the mediastinum and the coronary arteries, including calcified atherosclerotic plaque in the left main, left anterior descending, left circumflex and right coronary arteries. Right internal jugular single-lumen Port-A-Cath with tip terminating at the superior cavoatrial junction. Mediastinum/Nodes: No pathologically enlarged mediastinal or hilar lymph nodes. Esophagus is unremarkable in appearance. No axillary lymphadenopathy. Lungs/Pleura: Previously noted right lower lobe mass has decreased in size compared to the prior study (axial image 123 of series 5 and coronal image 89 of series 6), currently measuring 3.5 x 2.3 x 2.5 cm. This continues to have macrolobulated and slightly  spiculated margins with some surrounding architectural distortion, similar to the prior study. 4 mm subpleural nodule in the periphery of the left lower lobe (axial image 109 of series 5), stable, likely benign. No other new suspicious appearing pulmonary nodules or masses are noted. No acute consolidative airspace disease. No pleural effusions. Diffuse bronchial wall thickening with moderate centrilobular and paraseptal emphysema. Upper Abdomen: Aortic atherosclerosis. Musculoskeletal: There are no aggressive appearing lytic or blastic lesions noted in the visualized portions of the skeleton. IMPRESSION: 1. Treated right lower lobe mass appears slightly smaller than the prior examination, indicating a positive response to therapy. No definite metastatic lymphadenopathy or other signs of metastatic disease in the thorax on today's examination. 2. Diffuse bronchial wall thickening with moderate centrilobular and paraseptal emphysema; imaging findings suggestive of underlying COPD. 3. Aortic atherosclerosis, in addition to left main and three-vessel coronary artery disease. Assessment for potential risk factor modification, dietary therapy or pharmacologic therapy may  be warranted, if clinically indicated. Aortic Atherosclerosis (ICD10-I70.0) and Emphysema (ICD10-J43.9). Electronically Signed   By: Kristy Salazar M.D.   On: 08/22/2023 07:57     ASSESSMENT/PLAN:  This is a very pleasant 83 year old African-American female with stage IIIb (T4, N2, M0) non-small cell lung cancer, squamous cell carcinoma.  She presented with a large right lower lobe lung mass in addition to other right upper lobe pulmonary nodules and right hilar mediastinal lymphadenopathy.  She was diagnosed in September 2024.     PDL1 expression 1% per navigator.    She completed a course of concurrent chemoradiation with weekly carboplatin  for an AUC of 2 paclitaxel  45 mg/m. She started on 06/06/23. She is status post 7 cycles.  Her last  day of treatment was on 07/18/2023.  The patient was seen with Dr. Marguerita Shih today.  Dr. Marguerita Shih personally and independently reviewed the scan and discussed results with the patient today.  The scan showed positive response to treatment.  Dr. Marguerita Shih recommends consolidation immunotherapy with Imfinzi  1500 mg IV every 4 weeks.  The patient is interested in this option and she is expected to undergo her first cycle of treatment next week on ~08/29/2023.  I discussed with her the adverse effect of the immunotherapy including but not limited to immunotherapy mediated skin rash, diarrhea, inflammation of the lung, kidney, liver, thyroid  or other endocrine dysfunction  Will see her back for follow-up visit in 5 weeks for evaluation and repeat blood work before undergoing cycle #2.  I have wrote a letter to ask that they extend her short-term disability for at least another 5 weeks to allow more recovery time.  We have added on 1 L of fluids today due to her hypotension and lightheadedness.  It sounds like her PCP started her on an appetite stimulant.  Recommend that she continue this.  He was advised to salt water rinses and Biotene for her taste alterations.  I also have placed a referral to the nutritionist.  The patient was advised to call immediately if she has any concerning symptoms in the interval. The patient voices understanding of current disease status and treatment options and is in agreement with the current care plan. All questions were answered. The patient knows to call the clinic with any problems, questions or concerns. We can certainly see the patient much sooner if necessary      Orders Placed This Encounter  Procedures   CBC with Differential (Cancer Center Only)    Standing Status:   Future    Expected Date:   08/29/2023    Expiration Date:   08/28/2024   CMP (Cancer Center only)    Standing Status:   Future    Expected Date:   08/29/2023    Expiration Date:   08/28/2024    T4    Standing Status:   Future    Expected Date:   08/29/2023    Expiration Date:   08/28/2024   TSH    Standing Status:   Future    Expected Date:   08/29/2023    Expiration Date:   08/28/2024   CBC with Differential (Cancer Center Only)    Standing Status:   Future    Expected Date:   09/26/2023    Expiration Date:   09/25/2024   CMP (Cancer Center only)    Standing Status:   Future    Expected Date:   09/26/2023    Expiration Date:   09/25/2024   CBC with Differential (Cancer  Center Only)    Standing Status:   Future    Expected Date:   10/24/2023    Expiration Date:   10/23/2024   CMP (Cancer Center only)    Standing Status:   Future    Expected Date:   10/24/2023    Expiration Date:   10/23/2024   T4    Standing Status:   Future    Expected Date:   10/24/2023    Expiration Date:   10/23/2024   TSH    Standing Status:   Future    Expected Date:   10/24/2023    Expiration Date:   10/23/2024   CBC with Differential (Cancer Center Only)    Standing Status:   Future    Expected Date:   11/21/2023    Expiration Date:   11/20/2024   CMP (Cancer Center only)    Standing Status:   Future    Expected Date:   11/21/2023    Expiration Date:   11/20/2024   CBC with Differential (Cancer Center Only)    Standing Status:   Future    Expected Date:   12/19/2023    Expiration Date:   12/18/2024   CMP (Cancer Center only)    Standing Status:   Future    Expected Date:   12/19/2023    Expiration Date:   12/18/2024   CBC with Differential (Cancer Center Only)    Standing Status:   Future    Expected Date:   01/16/2024    Expiration Date:   01/15/2025   CMP (Cancer Center only)    Standing Status:   Future    Expected Date:   01/16/2024    Expiration Date:   01/15/2025   T4    Standing Status:   Future    Expected Date:   01/16/2024    Expiration Date:   01/15/2025   TSH    Standing Status:   Future    Expected Date:   01/16/2024    Expiration Date:   01/15/2025   CBC with Differential (Cancer Center Only)     Standing Status:   Future    Expected Date:   02/13/2024    Expiration Date:   02/12/2025   CMP (Cancer Center only)    Standing Status:   Future    Expected Date:   02/13/2024    Expiration Date:   02/12/2025   CBC with Differential (Cancer Center Only)    Standing Status:   Future    Expected Date:   03/12/2024    Expiration Date:   03/12/2025   CMP (Cancer Center only)    Standing Status:   Future    Expected Date:   03/12/2024    Expiration Date:   03/12/2025      Kristy Salazar L Kristy Odenthal, PA-C 08/22/23  ADDENDUM: Hematology/Oncology Attending: I had a face-to-face encounter with the patient today.  I reviewed her records, lab, scan and recommended her care plan.  This is a very pleasant 84 years old African-American female with a stage IIIb non-small cell lung cancer, squamous cell carcinoma diagnosed in September 2024.  The patient underwent a course of concurrent chemoradiation with weekly carboplatin  and paclitaxel  and she tolerated her treatment well except for fatigue and few pounds of weight loss. She had repeat CT scan of the chest performed recently.  I personally and independently reviewed the scan images and discussed the result with the patient and her daughter.  Her scan showed improvement of  her disease with decrease in the size of the right lower lobe lung mass and mediastinal lymphadenopathy. I discussed with the patient the next step in her treatment including close monitoring versus proceeding with consolidation treatment with immunotherapy with Imfinzi  1500 Mg IV every 4 weeks for 1 year.  I discussed with the patient the benefit as well as the adverse effect of the treatment with immunotherapy and she is interested in proceeding with the treatment.  She is expected to start the first cycle of this treatment next week. She will come back for follow-up visit in 5 weeks for evaluation with the start of cycle #2. The patient was advised to call immediately if she has any other  concerning symptoms in the interval. The total time spent in the appointment was 30 minutes. Disclaimer: This note was dictated with voice recognition software. Similar sounding words can inadvertently be transcribed and may be missed upon review. Aurelio Blower, MD

## 2023-08-22 ENCOUNTER — Inpatient Hospital Stay (HOSPITAL_BASED_OUTPATIENT_CLINIC_OR_DEPARTMENT_OTHER): Payer: BC Managed Care – PPO | Admitting: Physician Assistant

## 2023-08-22 ENCOUNTER — Inpatient Hospital Stay: Payer: BC Managed Care – PPO

## 2023-08-22 VITALS — BP 99/61 | HR 86 | Resp 16

## 2023-08-22 VITALS — BP 90/70 | HR 105 | Temp 98.5°F | Resp 18 | Wt 93.3 lb

## 2023-08-22 DIAGNOSIS — Z95828 Presence of other vascular implants and grafts: Secondary | ICD-10-CM

## 2023-08-22 DIAGNOSIS — Z7189 Other specified counseling: Secondary | ICD-10-CM | POA: Insufficient documentation

## 2023-08-22 DIAGNOSIS — R634 Abnormal weight loss: Secondary | ICD-10-CM | POA: Diagnosis not present

## 2023-08-22 DIAGNOSIS — C3431 Malignant neoplasm of lower lobe, right bronchus or lung: Secondary | ICD-10-CM

## 2023-08-22 DIAGNOSIS — Z5112 Encounter for antineoplastic immunotherapy: Secondary | ICD-10-CM | POA: Diagnosis not present

## 2023-08-22 MED ORDER — SODIUM CHLORIDE 0.9% FLUSH
10.0000 mL | Freq: Once | INTRAVENOUS | Status: AC
  Administered 2023-08-22: 10 mL

## 2023-08-22 MED ORDER — HEPARIN SOD (PORK) LOCK FLUSH 100 UNIT/ML IV SOLN
500.0000 [IU] | Freq: Once | INTRAVENOUS | Status: AC
  Administered 2023-08-22: 500 [IU]

## 2023-08-22 MED ORDER — SODIUM CHLORIDE 0.9 % IV SOLN: INTRAVENOUS | Status: DC

## 2023-08-22 NOTE — Patient Instructions (Signed)
 Nausea and Vomiting, Adult Nausea is feeling that you have an upset stomach and that you are about to vomit. Vomiting is when food in your stomach forcefully comes out of your mouth. Vomiting can make you feel weak. If you vomit, or if you are not able to drink enough fluids, you may not have enough water in your body (get dehydrated). If you do not have enough water in your body, you may: Feel tired. Feel thirsty. Have a dry mouth. Have cracked lips. Pee (urinate) less often. Older adults and people with other diseases or a weak body defense system (immune system) are at higher risk for not having enough water in the body. If you feel like you may vomit or you vomit, it is important to follow instructions from your doctor about how to take care of yourself. Follow these instructions at home: Watch your symptoms for any changes. Tell your doctor about them. Eating and drinking     Take an ORS (oral rehydration solution). This is a drink that is sold at pharmacies and stores. Drink clear fluids in small amounts as you are able, such as: Water. Ice chips. Fruit juice that has water added (diluted fruit juice). Low-calorie sports drinks. Eat bland, easy-to-digest foods in small amounts as you are able, such as: Bananas. Applesauce. Rice. Low-fat (lean) meats. Toast. Crackers. Avoid drinking fluids that have a lot of sugar or caffeine in them. This includes energy drinks, sports drinks, and soda. Avoid alcohol. Avoid spicy or fatty foods. General instructions Take over-the-counter and prescription medicines only as told by your doctor. Drink enough fluid to keep your pee (urine) pale yellow. Wash your hands often with soap and water for at least 20 seconds. If you cannot use soap and water, use hand sanitizer. Make sure that everyone in your home washes their hands well and often. Rest at home until you feel better. Watch your condition for any changes. Take slow and deep breaths  when you feel like you may vomit. Keep all follow-up visits. Contact a doctor if: Your symptoms get worse. You have new symptoms. You have a fever. You cannot drink fluids without vomiting. You feel like you may vomit for more than 2 days. You feel light-headed or dizzy. You have a headache. You have muscle cramps. You have a rash. You have pain while peeing. Get help right away if: You have pain in your chest, neck, arm, or jaw. You feel very weak or you faint. You vomit again and again. You have vomit that is bright red or looks like black coffee grounds. You have bloody or black poop (stools) or poop that looks like tar. You have a very bad headache, a stiff neck, or both. You have very bad pain, cramping, or bloating in your belly (abdomen). You have trouble breathing. You are breathing very quickly. Your heart is beating very quickly. Your skin feels cold and clammy. You feel confused. You have signs of losing too much water in your body, such as: Dark pee, very little pee, or no pee. Cracked lips. Dry mouth. Sunken eyes. Sleepiness. Weakness. These symptoms may be an emergency. Get help right away. Call 911. Do not wait to see if the symptoms will go away. Do not drive yourself to the hospital. Summary Nausea is feeling that you have an upset stomach and that you are about to vomit. Vomiting is when food in your stomach comes out of your mouth. Follow instructions from your doctor about eating and drinking.  Take over-the-counter and prescription medicines only as told by your doctor. Contact your doctor if your symptoms get worse or you have new symptoms. Keep all follow-up visits. This information is not intended to replace advice given to you by your health care provider. Make sure you discuss any questions you have with your health care provider. Document Revised: 01/02/2021 Document Reviewed: 01/02/2021 Elsevier Patient Education  2024 [employer].

## 2023-08-22 NOTE — Patient Instructions (Signed)
-  We covered a lot of important information at your appointment today regarding what the treatment plan is moving forward. Here are the the main points that were discussed at your office visit with us  today:  -The treatment will consist of a new medication. This is not chemotherapy. This new drug is a type of Immunotherapy called Imfinzi  (Durvalumab ).  -We are planning on starting your treatment next week on 08/29/23  -Your treatment will be given once every 4 weeks. You will receive this treatment every four weeks for a total of 1 year (13 total treatments) unless you experience unacceptable toxicity or if there is evidence on your routine CT scans that the cancer is growing  -We will get a CT scan after every 3 treatments to check on the progress of treatment  Side Effects:  -The adverse effect of the immunotherapy including but not limited to immunotherapy mediated skin rash, diarrhea, inflammation of the lung, kidney, liver, thyroid  or other endocrine dysfunction  Follow up:  -We will see you back for a follow up visit in 5 week with cycle #2

## 2023-08-23 ENCOUNTER — Other Ambulatory Visit: Payer: Self-pay

## 2023-08-24 ENCOUNTER — Telehealth: Payer: Self-pay | Admitting: Dietician

## 2023-08-24 NOTE — Telephone Encounter (Signed)
Scheduled appointment per 2/12 scheduling message. Patient is aware of the appointment scheduled.

## 2023-08-25 ENCOUNTER — Other Ambulatory Visit: Payer: Self-pay

## 2023-08-29 ENCOUNTER — Ambulatory Visit
Admission: RE | Admit: 2023-08-29 | Discharge: 2023-08-29 | Disposition: A | Discharge status: Home / Self Care | Payer: BC Managed Care – PPO | Source: Ambulatory Visit | Attending: Physician Assistant | Admitting: Physician Assistant

## 2023-08-29 ENCOUNTER — Ambulatory Visit: Payer: BC Managed Care – PPO | Admitting: Physician Assistant

## 2023-08-29 ENCOUNTER — Inpatient Hospital Stay: Payer: BC Managed Care – PPO

## 2023-08-29 DIAGNOSIS — R634 Abnormal weight loss: Secondary | ICD-10-CM | POA: Diagnosis not present

## 2023-08-29 DIAGNOSIS — Z5112 Encounter for antineoplastic immunotherapy: Secondary | ICD-10-CM | POA: Diagnosis not present

## 2023-08-29 DIAGNOSIS — Z95828 Presence of other vascular implants and grafts: Secondary | ICD-10-CM

## 2023-08-29 DIAGNOSIS — C3431 Malignant neoplasm of lower lobe, right bronchus or lung: Secondary | ICD-10-CM | POA: Insufficient documentation

## 2023-08-29 DIAGNOSIS — I10 Essential (primary) hypertension: Secondary | ICD-10-CM | POA: Diagnosis not present

## 2023-08-29 DIAGNOSIS — C3490 Malignant neoplasm of unspecified part of unspecified bronchus or lung: Secondary | ICD-10-CM | POA: Diagnosis not present

## 2023-08-29 DIAGNOSIS — E1169 Type 2 diabetes mellitus with other specified complication: Secondary | ICD-10-CM | POA: Diagnosis not present

## 2023-08-29 LAB — CMP (CANCER CENTER ONLY)
ALT: 19 U/L (ref 0–44)
AST: 38 U/L (ref 15–41)
Albumin: 2.9 g/dL — ABNORMAL LOW (ref 3.5–5.0)
Alkaline Phosphatase: 102 U/L (ref 38–126)
Anion gap: 6 (ref 5–15)
BUN: 17 mg/dL (ref 8–23)
CO2: 29 mmol/L (ref 22–32)
Calcium: 9.1 mg/dL (ref 8.9–10.3)
Chloride: 106 mmol/L (ref 98–111)
Creatinine: 0.53 mg/dL (ref 0.44–1.00)
GFR, Estimated: >60 mL/min (ref >60)
Glucose, Bld: 111 mg/dL — ABNORMAL HIGH (ref 70–99)
Potassium: 3 mmol/L — ABNORMAL LOW (ref 3.5–5.1)
Sodium: 141 mmol/L (ref 135–145)
Total Bilirubin: 0.5 mg/dL (ref 0.0–1.2)
Total Protein: 6.8 g/dL (ref 6.5–8.1)

## 2023-08-29 LAB — CBC WITH DIFFERENTIAL (CANCER CENTER ONLY)
Abs Immature Granulocytes: 0.02 10*3/uL (ref 0.00–0.07)
Basophils Absolute: 0 10*3/uL (ref 0.0–0.1)
Basophils Relative: 1 %
Eosinophils Absolute: 0 10*3/uL (ref 0.0–0.5)
Eosinophils Relative: 0 %
HCT: 36.8 % (ref 36.0–46.0)
Hemoglobin: 11.9 g/dL — ABNORMAL LOW (ref 12.0–15.0)
Immature Granulocytes: 1 %
Lymphocytes Relative: 22 %
Lymphs Abs: 0.7 10*3/uL (ref 0.7–4.0)
MCH: 30 pg (ref 26.0–34.0)
MCHC: 32.3 g/dL (ref 30.0–36.0)
MCV: 92.7 fL (ref 80.0–100.0)
Monocytes Absolute: 0.6 10*3/uL (ref 0.1–1.0)
Monocytes Relative: 17 %
Neutro Abs: 1.9 10*3/uL (ref 1.7–7.7)
Neutrophils Relative %: 59 %
Platelet Count: 389 10*3/uL (ref 150–400)
RBC: 3.97 MIL/uL (ref 3.87–5.11)
RDW: 17.4 % — ABNORMAL HIGH (ref 11.5–15.5)
WBC Count: 3.2 10*3/uL — ABNORMAL LOW (ref 4.0–10.5)
nRBC: 0 % (ref 0.0–0.2)

## 2023-08-29 LAB — TSH: TSH: 0.976 u[IU]/mL (ref 0.350–4.500)

## 2023-08-29 MED ORDER — HEPARIN SOD (PORK) LOCK FLUSH 100 UNIT/ML IV SOLN
500.0000 [IU] | Freq: Once | INTRAVENOUS | Status: AC
  Administered 2023-08-29: 500 [IU]

## 2023-08-29 MED ORDER — SODIUM CHLORIDE 0.9% FLUSH
10.0000 mL | Freq: Once | INTRAVENOUS | Status: AC
Start: 2023-08-29 — End: 2023-08-29
  Administered 2023-08-29: 10 mL

## 2023-08-29 NOTE — Progress Notes (Signed)
  Radiation Oncology         [phone]) [phone] ________________________________  Name: Kristy Salazar MRN: [id number]  Date of Service: 08/29/2023  DOB: 22-Apr-1941  Post Treatment Telephone Note  Diagnosis:  Stage IIIB, cT4N2M0, NSCLC, squamous cell carcinoma of the right lower lobe (as documented in provider EOT note)  The patient was available for call today.   Symptoms of fatigue have improved since completing therapy.  Symptoms of skin changes have improved since completing therapy.  Symptoms of esophagitis have improved since completing therapy.  Patient reports moderate nausea/ vomiting if she overeats but is doing okay overall.  The patient has scheduled follow up with her medical oncologist Dr. Arbutus Ped for ongoing care, and was encouraged to call if she develops concerns or questions regarding radiation.   This concludes the interaction.  Ruel Favors, LPN

## 2023-08-30 ENCOUNTER — Other Ambulatory Visit: Payer: Self-pay | Admitting: Internal Medicine

## 2023-08-30 ENCOUNTER — Inpatient Hospital Stay: Payer: BC Managed Care – PPO

## 2023-08-30 ENCOUNTER — Telehealth: Payer: Self-pay

## 2023-08-30 VITALS — BP 139/76 | HR 100 | Temp 97.6°F | Resp 18 | Wt 93.4 lb

## 2023-08-30 DIAGNOSIS — C3431 Malignant neoplasm of lower lobe, right bronchus or lung: Secondary | ICD-10-CM

## 2023-08-30 DIAGNOSIS — Z5112 Encounter for antineoplastic immunotherapy: Secondary | ICD-10-CM | POA: Diagnosis not present

## 2023-08-30 LAB — T4: T4, Total: 8.6 ug/dL (ref 4.5–12.0)

## 2023-08-30 MED ORDER — SODIUM CHLORIDE 0.9 % IV SOLN: INTRAVENOUS | Status: DC

## 2023-08-30 MED ORDER — SODIUM CHLORIDE 0.9 % IV SOLN
1500.0000 mg | Freq: Once | INTRAVENOUS | Status: AC
  Administered 2023-08-30: 1500 mg via INTRAVENOUS
  Filled 2023-08-30: qty 30

## 2023-08-30 MED ORDER — SODIUM CHLORIDE 0.9% FLUSH
10.0000 mL | INTRAVENOUS | Status: DC | PRN
  Administered 2023-08-30: 10 mL

## 2023-08-30 MED ORDER — HEPARIN SOD (PORK) LOCK FLUSH 100 UNIT/ML IV SOLN
500.0000 [IU] | Freq: Once | INTRAVENOUS | Status: AC | PRN
  Administered 2023-08-30: 500 [IU]

## 2023-08-30 NOTE — Patient Instructions (Signed)

## 2023-08-30 NOTE — Telephone Encounter (Signed)
 Spoke with patient this morning. Per Dr. Arbutus Ped- lab results show potassium is low.  Patient needs to increase potassium in their diet.  Informed patient of potassium rich foods. Patient verbalized understanding.

## 2023-08-31 ENCOUNTER — Telehealth: Payer: Self-pay

## 2023-08-31 NOTE — Telephone Encounter (Signed)
 LM for patient that this nurse was calling to see how they were doing after their treatment. Please call back to Dr. Asa Lente nurse at [phone] if they have any questions or concerns regarding the treatment.

## 2023-08-31 NOTE — Telephone Encounter (Signed)
-----   Message from Nurse Reece Levy sent at 08/30/2023  9:43 AM EST ----- Regarding: Dr. Arbutus Ped 1st time Imfinzi f/u tol well Dr. Arbutus Ped 1st time Imfinzi, Pt call back due. Tolerated tx well.

## 2023-09-02 ENCOUNTER — Inpatient Hospital Stay: Payer: BC Managed Care – PPO | Admitting: Dietician

## 2023-09-02 NOTE — Progress Notes (Signed)
 Nutrition Assessment   Reason for Assessment: Wt loss   ASSESSMENT: 83 year old female with stage III non-small cell lung cancer of right lower lobe. She completed concurrent chemoradiation with weekly carbo/taxol. Final RT 1/14. Patient is under the care of Dr. Arbutus Ped.   Past medical history includes COPD, arthritis, current tobacco use   Met with patient in office. She is using a cane and reports feeling tired. Patient is recovering from radiation. Reports dysphagia/odynophagia with solids followed by vomiting. Recalls trying 2 french fries yesterday. She vomited within 2 minutes of swallowing. Patient is using carafate which helps some. Patient tolerates liquids without difficulty. Recalls 2 Ensure HP, 1 [employer], Gatorade, broth, 1-2 bottles of water.   Nutrition Focused Physical Exam:   Orbital Region: severe Buccal Region: severe Temple Region: moderate Clavicle Bone Region: severe Shoulder and Acromion Bone Region: severe Dorsal Hand: moderate Eyes: reviewed  Mouth: reviewed (missing teeth) Skin: reviewed  Nails: UTA -  painted   Medications: lasix 20 mg, hycet, klor-con, compazine, crestor, carafate   Labs: 2/17 - K 3.0, glucose 111, albumin 2.9   Anthropometrics:   Height: 5'9.5" Weight: 93 lb 6 oz  UBW: 125 lb (03/09/23) BMI: 13.59 (underwt)   NUTRITION DIAGNOSIS: Unintended wt loss related to cancer and associated treatment side effects as evidenced by radiation esophagitis, 26% wt loss in 6 months    MALNUTRITION DIAGNOSIS: Patient is underweight and meets criteria for severe malnutrition in the context of chronic disease as evidenced by moderate/severe fat and muscle wasting, 26% wt loss in 6 months which is severe for time frame   INTERVENTION:  Encouraged high calorie high protein foods in soft smooth textures (shakes, smoothies, pudding, ice cream, yogurt) Continue drinking Ensure, suggested switching to Ensure Complete/equivalent for added  calories and protein - recommend 2/day (samples provided)  Continue carafate, wait 20 minutes before po Encourage use of antiemetics for nausea, recommend waiting 30 minutes before po Patient brought to waiting area via wheelchair  MONITORING, EVALUATION, GOAL: Patient will tolerate increased calories and protein to promote wt gain   Next Visit: To be scheduled in collaboration with upcoming [facility] appointments

## 2023-09-07 ENCOUNTER — Telehealth: Payer: Self-pay

## 2023-09-07 ENCOUNTER — Other Ambulatory Visit: Payer: Self-pay

## 2023-09-07 ENCOUNTER — Encounter: Payer: Self-pay | Admitting: Internal Medicine

## 2023-09-07 ENCOUNTER — Other Ambulatory Visit: Payer: Self-pay | Admitting: Physician Assistant

## 2023-09-07 ENCOUNTER — Inpatient Hospital Stay: Payer: BC Managed Care – PPO

## 2023-09-07 VITALS — BP 115/90 | HR 111 | Resp 16

## 2023-09-07 DIAGNOSIS — R079 Chest pain, unspecified: Secondary | ICD-10-CM | POA: Diagnosis not present

## 2023-09-07 DIAGNOSIS — R627 Adult failure to thrive: Secondary | ICD-10-CM | POA: Diagnosis not present

## 2023-09-07 DIAGNOSIS — F17210 Nicotine dependence, cigarettes, uncomplicated: Secondary | ICD-10-CM | POA: Diagnosis not present

## 2023-09-07 DIAGNOSIS — R638 Other symptoms and signs concerning food and fluid intake: Secondary | ICD-10-CM

## 2023-09-07 DIAGNOSIS — Z923 Personal history of irradiation: Secondary | ICD-10-CM | POA: Diagnosis not present

## 2023-09-07 DIAGNOSIS — K209 Esophagitis, unspecified without bleeding: Secondary | ICD-10-CM

## 2023-09-07 DIAGNOSIS — J690 Pneumonitis due to inhalation of food and vomit: Secondary | ICD-10-CM | POA: Diagnosis not present

## 2023-09-07 DIAGNOSIS — R918 Other nonspecific abnormal finding of lung field: Secondary | ICD-10-CM | POA: Diagnosis not present

## 2023-09-07 DIAGNOSIS — E785 Hyperlipidemia, unspecified: Secondary | ICD-10-CM | POA: Diagnosis not present

## 2023-09-07 DIAGNOSIS — I10 Essential (primary) hypertension: Secondary | ICD-10-CM | POA: Diagnosis not present

## 2023-09-07 DIAGNOSIS — T66XXXA Radiation sickness, unspecified, initial encounter: Secondary | ICD-10-CM | POA: Diagnosis not present

## 2023-09-07 DIAGNOSIS — Z635 Disruption of family by separation and divorce: Secondary | ICD-10-CM | POA: Diagnosis not present

## 2023-09-07 DIAGNOSIS — E860 Dehydration: Secondary | ICD-10-CM | POA: Diagnosis not present

## 2023-09-07 DIAGNOSIS — E870 Hyperosmolality and hypernatremia: Secondary | ICD-10-CM | POA: Diagnosis not present

## 2023-09-07 DIAGNOSIS — C3490 Malignant neoplasm of unspecified part of unspecified bronchus or lung: Secondary | ICD-10-CM

## 2023-09-07 DIAGNOSIS — C3431 Malignant neoplasm of lower lobe, right bronchus or lung: Secondary | ICD-10-CM | POA: Diagnosis not present

## 2023-09-07 DIAGNOSIS — R1110 Vomiting, unspecified: Secondary | ICD-10-CM | POA: Diagnosis not present

## 2023-09-07 DIAGNOSIS — R911 Solitary pulmonary nodule: Secondary | ICD-10-CM | POA: Diagnosis not present

## 2023-09-07 DIAGNOSIS — Z1152 Encounter for screening for COVID-19: Secondary | ICD-10-CM | POA: Diagnosis not present

## 2023-09-07 DIAGNOSIS — R1310 Dysphagia, unspecified: Secondary | ICD-10-CM | POA: Diagnosis not present

## 2023-09-07 DIAGNOSIS — Z9221 Personal history of antineoplastic chemotherapy: Secondary | ICD-10-CM | POA: Diagnosis not present

## 2023-09-07 DIAGNOSIS — B3789 Other sites of candidiasis: Secondary | ICD-10-CM | POA: Diagnosis not present

## 2023-09-07 DIAGNOSIS — E876 Hypokalemia: Secondary | ICD-10-CM | POA: Diagnosis not present

## 2023-09-07 DIAGNOSIS — I700 Atherosclerosis of aorta: Secondary | ICD-10-CM | POA: Diagnosis not present

## 2023-09-07 DIAGNOSIS — K208 Other esophagitis without bleeding: Secondary | ICD-10-CM | POA: Diagnosis not present

## 2023-09-07 DIAGNOSIS — J4489 Other specified chronic obstructive pulmonary disease: Secondary | ICD-10-CM | POA: Diagnosis not present

## 2023-09-07 DIAGNOSIS — Z95828 Presence of other vascular implants and grafts: Secondary | ICD-10-CM

## 2023-09-07 DIAGNOSIS — B370 Candidal stomatitis: Secondary | ICD-10-CM | POA: Diagnosis not present

## 2023-09-07 DIAGNOSIS — E11649 Type 2 diabetes mellitus with hypoglycemia without coma: Secondary | ICD-10-CM | POA: Diagnosis not present

## 2023-09-07 DIAGNOSIS — K222 Esophageal obstruction: Secondary | ICD-10-CM | POA: Diagnosis not present

## 2023-09-07 DIAGNOSIS — E43 Unspecified severe protein-calorie malnutrition: Secondary | ICD-10-CM | POA: Diagnosis not present

## 2023-09-07 DIAGNOSIS — Z681 Body mass index (BMI) 19 or less, adult: Secondary | ICD-10-CM | POA: Diagnosis not present

## 2023-09-07 DIAGNOSIS — R64 Cachexia: Secondary | ICD-10-CM | POA: Diagnosis not present

## 2023-09-07 MED ORDER — SODIUM CHLORIDE 0.9 % IV SOLN: Freq: Once | INTRAVENOUS | Status: AC

## 2023-09-07 MED ORDER — SODIUM CHLORIDE 0.9% FLUSH
10.0000 mL | Freq: Once | INTRAVENOUS | Status: AC
Start: 2023-09-07 — End: 2023-09-07
  Administered 2023-09-07: 10 mL

## 2023-09-07 MED ORDER — HEPARIN SOD (PORK) LOCK FLUSH 100 UNIT/ML IV SOLN
500.0000 [IU] | Freq: Once | INTRAVENOUS | Status: AC
  Administered 2023-09-07: 500 [IU]

## 2023-09-07 NOTE — Telephone Encounter (Signed)
 Patients daughter called and reports that patient has not ate or drink fluids in several days, trouble swallowing, gagging and painful when trying to eat or drink. Per Cassie, PA-  patient is likely experiencing side effects from radiation to the chest.  Patient can receive IVF's for hydration. Recommends referral to GI.  Daughter reports patient is taking compazine, carafate and hycet. Informed daughter that these symptoms can take a while to subside and continue taking those medications and encourage fluid intake. Charge nurse confirmed IVF's today at 1230 and patients daughter is aware. Referral will be placed for GI.

## 2023-09-07 NOTE — Patient Instructions (Signed)
Rehydration, Adult  Rehydration is the replacement of fluids, salts, and minerals in the body (electrolytes) that are lost during dehydration. Dehydration is when there is not enough water or other fluids in the body. This happens when you lose more fluids than you take in. People who are age 81 or older have a higher risk of dehydration than younger adults. This is because in older age, the body: Is less able to maintain the right amount of water. Does not respond to temperature changes as well. Does not get a sense of thirst as easily or quickly. Other causes include: Not drinking enough fluids. This can occur when you are ill, when you forget to drink, or when you are doing activities that require a lot of energy, especially in hot weather. Conditions that cause loss of water or other fluids. These include diarrhea, vomiting, sweating, or urinating a lot. Other illnesses, such as fever or infection. Certain medicines, such as those that remove excess fluid from the body (diuretics). Symptoms of mild or moderate dehydration may include thirst, dry lips and mouth, and dizziness. Symptoms of severe dehydration may include increased heart rate, confusion, fainting, and not urinating. In severe cases, you may need to get fluids through an IV at the hospital. For mild or moderate cases, you can usually rehydrate at home by drinking certain fluids as told by your health care provider. What are the risks? Rehydration is usually safe. Taking in too much fluid (overhydration) can be a problem but is rare. Overhydration can cause an imbalance of electrolytes in the body, kidney failure, fluid in the lungs, or a decrease in salt (sodium) levels in the body. Supplies needed: You will need an oral rehydration solution (ORS) if your health care provider tells you to use one. This is a drink to treat dehydration. It can be found in pharmacies and retail stores. How to rehydrate Fluids Follow instructions from  your health care provider about what to drink. The kind of fluid and the amount you should drink depend on your condition. In general, you should choose drinks that you prefer. If told by your health care provider, drink an ORS. Make an ORS by following instructions on the package. Start by drinking small amounts, about  cup (120 mL) every 5-10 minutes. Slowly increase how much you drink until you have taken in the amount recommended by your health care provider. Drink enough clear fluids to keep your urine pale yellow. If you were told to drink an ORS, finish it first, then start slowly drinking other clear fluids. Drink fluids such as: Water. This includes sparkling and flavored water. Drinking only water can lead to having too little sodium in your body (hyponatremia). Follow the advice of your health care provider. Water from ice chips you suck on. Fruit juice with water added to it(diluted). Sports drinks. Hot or cold herbal teas. Broth-based soups. Coffee. Milk or milk products. Food Follow instructions from your health care provider about what to eat while you rehydrate. Your health care provider may recommend that you slowly begin eating regular foods in small amounts. Eat foods that contain a healthy balance of electrolytes, such as bananas, oranges, potatoes, tomatoes, and spinach. Avoid foods that are greasy or contain a lot of sugar. In some cases, you may get nutrition through a feeding tube that is passed through your nose and into your stomach (nasogastric tube, or NG tube). This may be done if you have uncontrolled vomiting or diarrhea. Drinks to avoid  Certain drinks may make dehydration worse. While you rehydrate, avoid drinking alcohol. How to tell if you are recovering from dehydration You may be getting better if: You are urinating more often than before you started rehydrating. Your urine is pale yellow. Your energy level improves. You vomit less often. You have  diarrhea less often. Your appetite improves or returns to normal. You feel less dizzy or light-headed. Your skin tone and color start to look more normal. Follow these instructions at home: Take over-the-counter and prescription medicines only as told by your health care provider. Do not take sodium tablets. Doing this can lead to having too much sodium in your body (hypernatremia). Contact a health care provider if: You continue to have symptoms of mild or moderate dehydration, such as: Thirst. Dry lips. Slightly dry mouth. Dizziness. Dark urine or less urine than usual. Muscle cramps. You continue to vomit or have diarrhea. Get help right away if: You have symptoms of dehydration that get worse. You have a fever. You have a severe headache. You have been vomiting and have problems, such as: Your vomiting gets worse. Your vomit includes blood or green matter (bile). You cannot eat or drink without vomiting. You have problems with urination or bowel movements, such as: Diarrhea that gets worse. Blood in your stool (feces). This may cause stool to look black and tarry. Not urinating, or urinating only a small amount of very dark urine, within 6-8 hours. You have trouble breathing. You have symptoms that get worse with treatment. These symptoms may be an emergency. Get help right away. Call 911. Do not wait to see if the symptoms will go away. Do not drive yourself to the hospital. This information is not intended to replace advice given to you by your health care provider. Make sure you discuss any questions you have with your health care provider. Document Revised: 11/11/2021 Document Reviewed: 11/09/2021 Elsevier Patient Education  2024 [employer].

## 2023-09-08 ENCOUNTER — Encounter: Payer: Self-pay | Admitting: Internal Medicine

## 2023-09-08 ENCOUNTER — Encounter (HOSPITAL_COMMUNITY): Payer: Self-pay

## 2023-09-08 ENCOUNTER — Emergency Department (HOSPITAL_COMMUNITY): Payer: BC Managed Care – PPO

## 2023-09-08 ENCOUNTER — Telehealth: Payer: Self-pay

## 2023-09-08 ENCOUNTER — Other Ambulatory Visit: Payer: Self-pay

## 2023-09-08 ENCOUNTER — Inpatient Hospital Stay (HOSPITAL_COMMUNITY)
Admission: EM | Admit: 2023-09-08 | Discharge: 2023-09-15 | DRG: 391 | Disposition: A | Discharge status: Home / Self Care | Payer: BC Managed Care – PPO | Attending: Family Medicine | Admitting: Family Medicine

## 2023-09-08 DIAGNOSIS — E860 Dehydration: Secondary | ICD-10-CM

## 2023-09-08 DIAGNOSIS — K208 Other esophagitis without bleeding: Principal | ICD-10-CM

## 2023-09-08 DIAGNOSIS — Z96651 Presence of right artificial knee joint: Secondary | ICD-10-CM | POA: Diagnosis present

## 2023-09-08 DIAGNOSIS — R9431 Abnormal electrocardiogram [ECG] [EKG]: Secondary | ICD-10-CM

## 2023-09-08 DIAGNOSIS — B370 Candidal stomatitis: Secondary | ICD-10-CM | POA: Diagnosis present

## 2023-09-08 DIAGNOSIS — R627 Adult failure to thrive: Secondary | ICD-10-CM | POA: Diagnosis present

## 2023-09-08 DIAGNOSIS — K209 Esophagitis, unspecified without bleeding: Principal | ICD-10-CM

## 2023-09-08 DIAGNOSIS — Z9221 Personal history of antineoplastic chemotherapy: Secondary | ICD-10-CM

## 2023-09-08 DIAGNOSIS — R590 Localized enlarged lymph nodes: Secondary | ICD-10-CM | POA: Diagnosis present

## 2023-09-08 DIAGNOSIS — C3431 Malignant neoplasm of lower lobe, right bronchus or lung: Secondary | ICD-10-CM | POA: Diagnosis present

## 2023-09-08 DIAGNOSIS — Z79899 Other long term (current) drug therapy: Secondary | ICD-10-CM

## 2023-09-08 DIAGNOSIS — R54 Age-related physical debility: Secondary | ICD-10-CM | POA: Diagnosis present

## 2023-09-08 DIAGNOSIS — Z1152 Encounter for screening for COVID-19: Secondary | ICD-10-CM

## 2023-09-08 DIAGNOSIS — Z881 Allergy status to other antibiotic agents status: Secondary | ICD-10-CM

## 2023-09-08 DIAGNOSIS — Y842 Radiological procedure and radiotherapy as the cause of abnormal reaction of the patient, or of later complication, without mention of misadventure at the time of the procedure: Secondary | ICD-10-CM | POA: Diagnosis present

## 2023-09-08 DIAGNOSIS — R64 Cachexia: Secondary | ICD-10-CM | POA: Diagnosis present

## 2023-09-08 DIAGNOSIS — E785 Hyperlipidemia, unspecified: Secondary | ICD-10-CM | POA: Diagnosis present

## 2023-09-08 DIAGNOSIS — Z90710 Acquired absence of both cervix and uterus: Secondary | ICD-10-CM

## 2023-09-08 DIAGNOSIS — R1310 Dysphagia, unspecified: Secondary | ICD-10-CM

## 2023-09-08 DIAGNOSIS — Z635 Disruption of family by separation and divorce: Secondary | ICD-10-CM

## 2023-09-08 DIAGNOSIS — J690 Pneumonitis due to inhalation of food and vomit: Secondary | ICD-10-CM

## 2023-09-08 DIAGNOSIS — Z681 Body mass index (BMI) 19 or less, adult: Secondary | ICD-10-CM

## 2023-09-08 DIAGNOSIS — E43 Unspecified severe protein-calorie malnutrition: Secondary | ICD-10-CM | POA: Diagnosis present

## 2023-09-08 DIAGNOSIS — F17210 Nicotine dependence, cigarettes, uncomplicated: Secondary | ICD-10-CM | POA: Diagnosis present

## 2023-09-08 DIAGNOSIS — E11649 Type 2 diabetes mellitus with hypoglycemia without coma: Secondary | ICD-10-CM | POA: Diagnosis present

## 2023-09-08 DIAGNOSIS — K222 Esophageal obstruction: Secondary | ICD-10-CM | POA: Diagnosis present

## 2023-09-08 DIAGNOSIS — Z923 Personal history of irradiation: Secondary | ICD-10-CM

## 2023-09-08 DIAGNOSIS — J4489 Other specified chronic obstructive pulmonary disease: Secondary | ICD-10-CM | POA: Diagnosis present

## 2023-09-08 DIAGNOSIS — T66XXXA Radiation sickness, unspecified, initial encounter: Secondary | ICD-10-CM

## 2023-09-08 DIAGNOSIS — E870 Hyperosmolality and hypernatremia: Secondary | ICD-10-CM

## 2023-09-08 DIAGNOSIS — R112 Nausea with vomiting, unspecified: Secondary | ICD-10-CM

## 2023-09-08 DIAGNOSIS — B3789 Other sites of candidiasis: Secondary | ICD-10-CM | POA: Diagnosis present

## 2023-09-08 DIAGNOSIS — E876 Hypokalemia: Secondary | ICD-10-CM

## 2023-09-08 DIAGNOSIS — R0789 Other chest pain: Secondary | ICD-10-CM

## 2023-09-08 DIAGNOSIS — J449 Chronic obstructive pulmonary disease, unspecified: Secondary | ICD-10-CM | POA: Insufficient documentation

## 2023-09-08 DIAGNOSIS — I10 Essential (primary) hypertension: Secondary | ICD-10-CM | POA: Diagnosis present

## 2023-09-08 LAB — COMPREHENSIVE METABOLIC PANEL
ALT: 26 U/L (ref 0–44)
AST: 36 U/L (ref 15–41)
Albumin: 2.7 g/dL — ABNORMAL LOW (ref 3.5–5.0)
Alkaline Phosphatase: 99 U/L (ref 38–126)
Anion gap: 13 (ref 5–15)
BUN: 19 mg/dL (ref 8–23)
CO2: 23 mmol/L (ref 22–32)
Calcium: 9 mg/dL (ref 8.9–10.3)
Chloride: 111 mmol/L (ref 98–111)
Creatinine, Ser: 0.5 mg/dL (ref 0.44–1.00)
GFR, Estimated: >60 mL/min (ref >60)
Glucose, Bld: 106 mg/dL — ABNORMAL HIGH (ref 70–99)
Potassium: 2.9 mmol/L — ABNORMAL LOW (ref 3.5–5.1)
Sodium: 147 mmol/L — ABNORMAL HIGH (ref 135–145)
Total Bilirubin: 0.8 mg/dL (ref 0.0–1.2)
Total Protein: 7.7 g/dL (ref 6.5–8.1)

## 2023-09-08 LAB — URINALYSIS, ROUTINE W REFLEX MICROSCOPIC
Bacteria, UA: NONE SEEN
Bilirubin Urine: NEGATIVE
Glucose, UA: >=500 mg/dL — AB
Hgb urine dipstick: NEGATIVE
Ketones, ur: 20 mg/dL — AB
Nitrite: NEGATIVE
Protein, ur: NEGATIVE mg/dL
Specific Gravity, Urine: 1.022 (ref 1.005–1.030)
pH: 6 (ref 5.0–8.0)

## 2023-09-08 LAB — CBC WITH DIFFERENTIAL/PLATELET
Abs Immature Granulocytes: 0.05 10*3/uL (ref 0.00–0.07)
Basophils Absolute: 0 10*3/uL (ref 0.0–0.1)
Basophils Relative: 1 %
Eosinophils Absolute: 0 10*3/uL (ref 0.0–0.5)
Eosinophils Relative: 0 %
HCT: 40.3 % (ref 36.0–46.0)
Hemoglobin: 12.5 g/dL (ref 12.0–15.0)
Immature Granulocytes: 1 %
Lymphocytes Relative: 26 %
Lymphs Abs: 1 10*3/uL (ref 0.7–4.0)
MCH: 29.7 pg (ref 26.0–34.0)
MCHC: 31 g/dL (ref 30.0–36.0)
MCV: 95.7 fL (ref 80.0–100.0)
Monocytes Absolute: 0.7 10*3/uL (ref 0.1–1.0)
Monocytes Relative: 17 %
Neutro Abs: 2.3 10*3/uL (ref 1.7–7.7)
Neutrophils Relative %: 55 %
Platelets: 332 10*3/uL (ref 150–400)
RBC: 4.21 MIL/uL (ref 3.87–5.11)
RDW: 17.7 % — ABNORMAL HIGH (ref 11.5–15.5)
WBC: 4.1 10*3/uL (ref 4.0–10.5)
nRBC: 0 % (ref 0.0–0.2)

## 2023-09-08 LAB — TROPONIN I (HIGH SENSITIVITY)
Troponin I (High Sensitivity): 16 ng/L (ref <18)
Troponin I (High Sensitivity): 17 ng/L (ref <18)

## 2023-09-08 LAB — LIPASE, BLOOD: Lipase: 21 U/L (ref 11–51)

## 2023-09-08 MED ORDER — ONDANSETRON HCL 4 MG/2ML IJ SOLN
4.0000 mg | Freq: Once | INTRAMUSCULAR | Status: AC
  Administered 2023-09-08: 4 mg via INTRAVENOUS
  Filled 2023-09-08: qty 2

## 2023-09-08 NOTE — ED Notes (Signed)
 Patient stated she wanted her port accessed for care.

## 2023-09-08 NOTE — ED Provider Notes (Signed)
 [facility] [facility] AT [facility] Provider Note   CSN: [id number] Arrival date & time: 09/08/23  1916     History {Add pertinent medical, surgical, social history, OB history to HPI:1} Chief Complaint  Patient presents with   Emesis    Kristy Salazar is a 83 y.o. female.  HPI      83 year old female with a history of non-small cell lung cancer, squamous cell carcinoma, COPD, hypertension  Nausea, vomiting for over a month, decreased appetite. When getting ready to swallow feels like it gets it gets stuck then has vomiting.  Vomiting 10-12 times a day, every time tries to eat or drink, for one month especially last 2 weeks .  Was before immunotherapy but after was worse.  Anything was coming back up immediately. Prior to that still having trouble keeping things down No abdominal pain, just chest pain Chest pain feels like a cramp that comes after swallowing.  Otherwise no chest pain. No dyspnea.  No black or bloody stools. No diarrhea. No fevers.    Her non-small cell lung cancer was diagnosed in September 2024 when she presented with a large right lower lobe lung mass in addition to the right upper lobe pulmonary nodules and mediastinal lymphadenopathy.  She was seen February 10 and was scheduled to start her first cycle of immunotherapy 217  Hx of radiation/chemo prior ended mid January  Has not seen GI physician yet  Past Medical History:  Diagnosis Date   Arthritis    Asthma    COPD (chronic obstructive pulmonary disease) (HCC)    Diabetes mellitus    patient denies   Hypertension    Seasonal allergies     Past Surgical History:  Procedure Laterality Date   ABDOMINAL HYSTERECTOMY     APPENDECTOMY     BACK SURGERY     BRONCHIAL BIOPSY  05/17/2023   Procedure: BRONCHIAL BIOPSIES;  Surgeon: [name], MD;  Location: [facility];  Service: Pulmonary;;   BRONCHIAL BRUSHINGS  05/17/2023   Procedure: BRONCHIAL BRUSHINGS;  Surgeon: [name], MD;  Location: [facility];  Service: Pulmonary;;   BRONCHIAL NEEDLE ASPIRATION BIOPSY  05/17/2023   Procedure: BRONCHIAL NEEDLE ASPIRATION BIOPSIES;  Surgeon: [name], MD;  Location: [facility];  Service: Pulmonary;;   IR IMAGING GUIDED PORT INSERTION  06/03/2023   JOINT REPLACEMENT     TOTAL HIP ARTHROPLASTY     TOTAL KNEE ARTHROPLASTY     VIDEO BRONCHOSCOPY WITH ENDOBRONCHIAL ULTRASOUND Right 05/17/2023   Procedure: VIDEO BRONCHOSCOPY WITH ENDOBRONCHIAL ULTRASOUND;  Surgeon: [name], MD;  Location: [facility];  Service: Pulmonary;  Laterality: Right;    Home Medications Prior to Admission medications   Medication Sig Start Date End Date Taking? Authorizing Provider  albuterol (VENTOLIN HFA) 108 (90 Base) MCG/ACT inhaler Inhale 2 puffs into the lungs every 6 (six) hours as needed for wheezing or shortness of breath. 08/28/21   Elson Areas, PA-C  Fluticasone-Umeclidin-Vilant 100-62.5-25 MCG/INH AEPB Inhale 2 application into the lungs.    [provider]  furosemide (LASIX) 20 MG tablet Take 1-2 tablets in the morning as needed for your foot swelling. 02/05/23   Mardella Layman, MD  HYDROcodone-acetaminophen (HYCET) 7.5-325 mg/15 ml solution Take 10 mLs by mouth 4 (four) times daily as needed for moderate pain (pain score 4-6). 07/15/23   Margaretmary Dys, MD  montelukast (SINGULAIR) 10 MG tablet Take 10 mg by mouth daily. For seasonal allergies    [provider]  potassium chloride SA (KLOR-CON M) 20 MEQ tablet Take 1 tablet (20 mEq total) by mouth daily. 08/15/23   Heilingoetter, Cassandra L, PA-C  prochlorperazine (COMPAZINE) 10 MG tablet TAKE 1 TABLET BY MOUTH EVERY 6 HOURS AS NEEDED FOR NAUSEA FOR VOMITING 08/30/23   Si Gaul, MD  rosuvastatin (CRESTOR) 10 MG tablet Take 10 mg by mouth daily.      [provider]  sucralfate (CARAFATE) 1 g tablet Take 1 tablet (1 g total) by mouth 4 (four) times daily -  with meals and at bedtime.  Crush 1 tablet in 1 oz water and drink 5 min before meals for radiation induced esophagitis 07/04/23   Ronny Bacon, PA-C  tiotropium [facility]) 18 MCG inhalation capsule Place 18 mcg into inhaler and inhale daily.    [provider]      Allergies    Erythromycin    Review of Systems   Review of Systems  Physical Exam Updated Vital Signs BP 129/88   Pulse (!) 105   Temp 97.9 F (36.6 C) (Oral)   Resp 16   Ht 5\' 11"  (1.803 m)   Wt 42.2 kg   SpO2 98%   BMI 12.97 kg/m  Physical Exam  ED Results / Procedures / Treatments   Labs (all labs ordered are listed, but only abnormal results are displayed) Labs Reviewed  CBC WITH DIFFERENTIAL/PLATELET - Abnormal; Notable for the following components:      Result Value   RDW 17.7 (*)    All other components within normal limits  COMPREHENSIVE METABOLIC PANEL - Abnormal; Notable for the following components:   Sodium 147 (*)    Potassium 2.9 (*)    Glucose, Bld 106 (*)    Albumin 2.7 (*)    All other components within normal limits  LIPASE, BLOOD  URINALYSIS, ROUTINE W REFLEX MICROSCOPIC  TROPONIN I (HIGH SENSITIVITY)    EKG None  Radiology No results found.  Procedures Procedures  {Document cardiac monitor, telemetry assessment procedure when appropriate:1}  Medications Ordered in ED Medications  ondansetron (ZOFRAN) injection 4 mg (4 mg Intravenous Given 09/08/23 2118)    ED Course/ Medical Decision Making/ A&P   {   Click here for ABCD2, HEART and other calculatorsREFRESH Note before signing :1}                              Medical Decision Making Amount and/or Complexity of Data Reviewed Radiology: ordered.   ***  {Document critical care time when appropriate:1} {Document review of labs and clinical decision tools ie heart score, Chads2Vasc2 etc:1}  {Document your independent review of radiology images, and any outside records:1} {Document your discussion with family members, caretakers,  and with consultants:1} {Document social determinants of health affecting pt's care:1} {Document your decision making why or why not admission, treatments were needed:1} Final Clinical Impression(s) / ED Diagnoses Final diagnoses:  None    Rx / DC Orders ED Discharge Orders     None

## 2023-09-08 NOTE — ED Triage Notes (Signed)
 Lung cancer pt, pt just finished last radiation treatment last week. Pt has been vomiting for over a month and had decreased appetite. C/o sternal pain for a month.

## 2023-09-08 NOTE — Telephone Encounter (Signed)
 I notified the pt that she needing a HIPAA release form signed so we could complete her disability form. She recommend me to notified her daughter who helps her with her paper work. I was able to notified the daughter and emailed forms and got them back in the same day.     Chell M, CMA

## 2023-09-08 NOTE — ED Provider Triage Note (Signed)
 Emergency Medicine Provider Triage Evaluation Note  Kristy Salazar , a 83 y.o. female  was evaluated in triage.  Pt complains of N/V x 1 month. Lung CA (3B, squamous cell)  Review of Systems  Positive: N/V, sternal pain, fatigue Negative: Diarrhea, fever, chills  Physical Exam  BP 129/88   Pulse (!) 105   Temp 97.9 F (36.6 C) (Oral)   Resp 16   Ht 5\' 11"  (1.803 m)   Wt 42.2 kg   SpO2 98%   BMI 12.97 kg/m  Gen:   Awake, no distress   Resp:  Normal effort  MSK:   Moves extremities without difficulty  Other:    Medical Decision Making  Medically screening exam initiated at 7:29 PM.  Appropriate orders placed.  Kristy Salazar was informed that the remainder of the evaluation will be completed by another provider, this initial triage assessment does not replace that evaluation, and the importance of remaining in the ED until their evaluation is complete.  Labs ordered   Gretta Began 09/08/23 3086

## 2023-09-09 ENCOUNTER — Telehealth: Payer: Self-pay | Admitting: Radiation Oncology

## 2023-09-09 ENCOUNTER — Other Ambulatory Visit: Payer: Self-pay

## 2023-09-09 ENCOUNTER — Encounter (HOSPITAL_COMMUNITY): Payer: Self-pay

## 2023-09-09 ENCOUNTER — Emergency Department (HOSPITAL_COMMUNITY): Payer: BC Managed Care – PPO

## 2023-09-09 DIAGNOSIS — T66XXXA Radiation sickness, unspecified, initial encounter: Secondary | ICD-10-CM | POA: Diagnosis not present

## 2023-09-09 DIAGNOSIS — R1310 Dysphagia, unspecified: Secondary | ICD-10-CM

## 2023-09-09 DIAGNOSIS — R9431 Abnormal electrocardiogram [ECG] [EKG]: Secondary | ICD-10-CM

## 2023-09-09 DIAGNOSIS — J690 Pneumonitis due to inhalation of food and vomit: Secondary | ICD-10-CM

## 2023-09-09 DIAGNOSIS — E870 Hyperosmolality and hypernatremia: Secondary | ICD-10-CM

## 2023-09-09 DIAGNOSIS — E876 Hypokalemia: Secondary | ICD-10-CM

## 2023-09-09 DIAGNOSIS — K208 Other esophagitis without bleeding: Secondary | ICD-10-CM | POA: Diagnosis not present

## 2023-09-09 HISTORY — DX: Pneumonitis due to inhalation of food and vomit: J69.0

## 2023-09-09 LAB — BASIC METABOLIC PANEL
Anion gap: 12 (ref 5–15)
BUN: 11 mg/dL (ref 8–23)
CO2: 24 mmol/L (ref 22–32)
Calcium: 8.4 mg/dL — ABNORMAL LOW (ref 8.9–10.3)
Chloride: 104 mmol/L (ref 98–111)
Creatinine, Ser: 0.49 mg/dL (ref 0.44–1.00)
GFR, Estimated: >60 mL/min (ref >60)
Glucose, Bld: 74 mg/dL (ref 70–99)
Potassium: 2.9 mmol/L — ABNORMAL LOW (ref 3.5–5.1)
Sodium: 140 mmol/L (ref 135–145)

## 2023-09-09 LAB — CBG MONITORING, ED
Glucose-Capillary: 65 mg/dL — ABNORMAL LOW (ref 70–99)
Glucose-Capillary: 101 mg/dL — ABNORMAL HIGH (ref 70–99)

## 2023-09-09 LAB — HEMOGLOBIN A1C
Hgb A1c MFr Bld: 5.3 % (ref 4.8–5.6)
Mean Plasma Glucose: 105.41 mg/dL

## 2023-09-09 LAB — SARS CORONAVIRUS 2 BY RT PCR: SARS Coronavirus 2 by RT PCR: NEGATIVE

## 2023-09-09 LAB — GLUCOSE, CAPILLARY
Glucose-Capillary: 103 mg/dL — ABNORMAL HIGH (ref 70–99)
Glucose-Capillary: 103 mg/dL — ABNORMAL HIGH (ref 70–99)

## 2023-09-09 LAB — MAGNESIUM: Magnesium: 2 mg/dL (ref 1.7–2.4)

## 2023-09-09 MED ORDER — INSULIN ASPART 100 UNIT/ML IJ SOLN
0.0000 [IU] | INTRAMUSCULAR | Status: DC
  Filled 2023-09-09: qty 0.09

## 2023-09-09 MED ORDER — ALBUTEROL SULFATE HFA 108 (90 BASE) MCG/ACT IN AERS: 1.0000 | INHALATION_SPRAY | Freq: Four times a day (QID) | RESPIRATORY_TRACT | Status: DC | PRN

## 2023-09-09 MED ORDER — TIOTROPIUM BROMIDE MONOHYDRATE 18 MCG IN CAPS: 18.0000 ug | ORAL_CAPSULE | Freq: Every day | RESPIRATORY_TRACT | Status: DC

## 2023-09-09 MED ORDER — INSULIN ASPART 100 UNIT/ML IJ SOLN
0.0000 [IU] | INTRAMUSCULAR | Status: DC
  Filled 2023-09-09: qty 0.06

## 2023-09-09 MED ORDER — ONDANSETRON HCL 4 MG/2ML IJ SOLN
4.0000 mg | Freq: Three times a day (TID) | INTRAMUSCULAR | Status: AC | PRN
  Administered 2023-09-09 – 2023-09-10 (×2): 4 mg via INTRAVENOUS
  Filled 2023-09-09 (×3): qty 2

## 2023-09-09 MED ORDER — SODIUM CHLORIDE 0.9 % IV SOLN
3.0000 g | Freq: Four times a day (QID) | INTRAVENOUS | Status: DC
  Administered 2023-09-09 – 2023-09-10 (×5): 3 g via INTRAVENOUS
  Filled 2023-09-09 (×7): qty 8

## 2023-09-09 MED ORDER — FLUCONAZOLE IN SODIUM CHLORIDE 200-0.9 MG/100ML-% IV SOLN
200.0000 mg | INTRAVENOUS | Status: DC
  Administered 2023-09-09 – 2023-09-14 (×6): 200 mg via INTRAVENOUS
  Filled 2023-09-09 (×6): qty 100

## 2023-09-09 MED ORDER — LACTATED RINGERS IV BOLUS
1000.0000 mL | Freq: Once | INTRAVENOUS | Status: AC
  Administered 2023-09-09: 1000 mL via INTRAVENOUS

## 2023-09-09 MED ORDER — UMECLIDINIUM BROMIDE 62.5 MCG/ACT IN AEPB
1.0000 | INHALATION_SPRAY | Freq: Every day | RESPIRATORY_TRACT | Status: DC
  Administered 2023-09-10 – 2023-09-15 (×6): 1 via RESPIRATORY_TRACT
  Filled 2023-09-09: qty 7

## 2023-09-09 MED ORDER — ONDANSETRON HCL 4 MG/2ML IJ SOLN
4.0000 mg | Freq: Once | INTRAMUSCULAR | Status: AC
  Administered 2023-09-09: 4 mg via INTRAVENOUS
  Filled 2023-09-09: qty 2

## 2023-09-09 MED ORDER — TRIMETHOBENZAMIDE HCL 100 MG/ML IM SOLN: 200.0000 mg | Freq: Three times a day (TID) | INTRAMUSCULAR | Status: DC | PRN

## 2023-09-09 MED ORDER — POTASSIUM CHLORIDE 10 MEQ/100ML IV SOLN
10.0000 meq | INTRAVENOUS | Status: AC
  Administered 2023-09-09 (×2): 10 meq via INTRAVENOUS
  Filled 2023-09-09 (×3): qty 100

## 2023-09-09 MED ORDER — POTASSIUM CHLORIDE 10 MEQ/100ML IV SOLN
10.0000 meq | INTRAVENOUS | Status: AC
  Administered 2023-09-10: 10 meq via INTRAVENOUS
  Filled 2023-09-09 (×2): qty 100

## 2023-09-09 MED ORDER — DEXTROSE-SODIUM CHLORIDE 5-0.45 % IV SOLN: INTRAVENOUS | Status: AC

## 2023-09-09 MED ORDER — ALBUTEROL SULFATE (2.5 MG/3ML) 0.083% IN NEBU: 2.5000 mg | INHALATION_SOLUTION | Freq: Four times a day (QID) | RESPIRATORY_TRACT | Status: DC | PRN

## 2023-09-09 MED ORDER — IOHEXOL 300 MG/ML  SOLN
75.0000 mL | Freq: Once | INTRAMUSCULAR | Status: AC | PRN
  Administered 2023-09-09: 75 mL via INTRAVENOUS

## 2023-09-09 MED ORDER — SUCRALFATE 1 GM/10ML PO SUSP
1.0000 g | Freq: Three times a day (TID) | ORAL | Status: DC
  Administered 2023-09-09 – 2023-09-15 (×17): 1 g via ORAL
  Filled 2023-09-09 (×21): qty 10

## 2023-09-09 MED ORDER — ONDANSETRON HCL 4 MG/2ML IJ SOLN
4.0000 mg | Freq: Three times a day (TID) | INTRAMUSCULAR | Status: DC | PRN
  Administered 2023-09-09: 4 mg via INTRAVENOUS
  Filled 2023-09-09: qty 2

## 2023-09-09 MED ORDER — NYSTATIN 100000 UNIT/ML MT SUSP
5.0000 mL | Freq: Four times a day (QID) | OROMUCOSAL | Status: DC
  Administered 2023-09-09 – 2023-09-15 (×17): 500000 [IU] via ORAL
  Filled 2023-09-09 (×18): qty 5

## 2023-09-09 MED ORDER — SUCRALFATE 1 G PO TABS: 1.0000 g | ORAL_TABLET | Freq: Three times a day (TID) | ORAL | Status: DC

## 2023-09-09 MED ORDER — PANTOPRAZOLE SODIUM 40 MG IV SOLR
40.0000 mg | Freq: Two times a day (BID) | INTRAVENOUS | Status: DC
  Administered 2023-09-09 – 2023-09-15 (×12): 40 mg via INTRAVENOUS
  Filled 2023-09-09 (×13): qty 10

## 2023-09-09 MED ORDER — ENOXAPARIN SODIUM 30 MG/0.3ML IJ SOSY
30.0000 mg | PREFILLED_SYRINGE | INTRAMUSCULAR | Status: DC
  Administered 2023-09-09 – 2023-09-14 (×5): 30 mg via SUBCUTANEOUS
  Filled 2023-09-09 (×6): qty 0.3

## 2023-09-09 MED ORDER — SODIUM CHLORIDE 0.9 % IV SOLN: INTRAVENOUS | Status: DC

## 2023-09-09 MED ORDER — POTASSIUM CHLORIDE 10 MEQ/100ML IV SOLN
10.0000 meq | INTRAVENOUS | Status: AC
  Administered 2023-09-09 (×4): 10 meq via INTRAVENOUS
  Filled 2023-09-09 (×4): qty 100

## 2023-09-09 NOTE — Progress Notes (Signed)
 PHARMACY - PHYSICIAN COMMUNICATION CRITICAL VALUE ALERT - BLOOD CULTURE IDENTIFICATION (BCID)  Kristy Salazar is an 83 y.o. female who presented to [name] on 09/08/2023 with a chief complaint of vomiting.  Assessment:   Admit with severe dysphagia/odynophagia possibly radiation esophagitis.  Also being treated for aspiration PNA.   1/4 blood cx + GPR.    Name of physician (or Provider) Contacted: [name], NP  Current antibiotics: Unasyn + Diflucan  Changes to prescribed antibiotics recommended:  Would not de-escalate based on above cx findings.  Continue to monitor.   No results found for this or any previous visit.  Junita Push PharmD 09/09/2023  11:31 PM

## 2023-09-09 NOTE — ED Notes (Addendum)
Wrong pt.  Disregard. 

## 2023-09-09 NOTE — Progress Notes (Signed)
 Pharmacy Antibiotic Note  Kristy Salazar is a 83 y.o. female with lunch cancer on durvalumab PTA who presented to the ED on 09/08/23 with c/o n/v and severe dysphagia/odynophagia.  Pharmacy has been consulted on 09/09/23 to dose fluconazole for oropharangeal candidiasis  Today, 09/09/2023: - scr 0.49 (crcl~37) - LFTs wnl  Plan: - fluconazole 200 mg IV q24h  ___________________________________________  Height: 5\' 11"  (180.3 cm) Weight: 42.2 kg (93 lb) IBW/kg (Calculated) : 70.8  Temp (24hrs), Avg:98.5 F (36.9 C), Min:98.3 F (36.8 C), Max:98.7 F (37.1 C)  Recent Labs  Lab 09/08/23 1954 09/09/23 0657  WBC 4.1  --   CREATININE 0.50 0.49    Estimated Creatinine Clearance: 37.4 mL/min (by C-G formula based on SCr of 0.49 mg/dL).    Allergies  Allergen Reactions   Erythromycin Hives     Thank you for allowing pharmacy to be a part of this patient's care.  Lucia Gaskins 09/09/2023 7:37 PM

## 2023-09-09 NOTE — H&P (Signed)
 History and Physical    NYJAE HODGE Salazar:[id number] DOB: 07-17-1940 DOA: 09/08/2023  PCP: Kristy Rakers, MD  Patient coming from: Home  Chief Complaint: Nausea, vomiting  HPI: Kristy Salazar is a 83 y.o. female with medical history significant of stage IIIb non-small cell lung cancer/squamous cell carcinoma diagnosed in September 2024 status post concurrent chemoradiation and currently on immunotherapy, COPD/asthma, type 2 diabetes, hypertension, hyperlipidemia presented to ED with complaints of nausea, vomiting, and difficulty swallowing.  Afebrile.  Labs showed no leukocytosis, sodium 147, potassium 2.9, magnesium 2.0, glucose 106, creatinine 0.5, normal lipase and LFTs.  UA with negative nitrite, trace leukocytes, and microscopy showing 6-10 WBCs and no bacteria.  Troponin negative x 2.  CT chest with contrast showing findings consistent with esophagitis, possibly radiation-induced given history.  No pneumomediastinum.  Also showing patchy groundglass opacities with bronchial wall thickening in the right middle and lower lobe suspicious for possible aspiration pneumonia. Patient was given Zofran, 1 L LR, and IV potassium 40 mEq total.  TRH called to admit.  History provided by the patient and her daughter at bedside.  Patient was previously receiving chemo and radiation for her lung cancer.  Finished her last radiation treatment in mid January and currently on immunotherapy.  For several weeks she is having difficulty swallowing/pain with swallowing, nausea, and vomiting.  She saw a dietitian but is not able to swallow even soft foods such as yogurt and liquids such as Ensure.  Patient is concerned that she has been losing a lot of weight due to not being able to eat or drink.  She was referred to GI and currently in the process of making an appointment.  She is also reporting ongoing shortness of breath and cough related to her lung cancer.  Denies fevers or chills.  Denies abdominal pain.  No  other complaints.  Review of Systems:  Review of Systems  All other systems reviewed and are negative.   Past Medical History:  Diagnosis Date   Arthritis    Asthma    COPD (chronic obstructive pulmonary disease) (HCC)    Diabetes mellitus    patient denies   Hypertension    Seasonal allergies     Past Surgical History:  Procedure Laterality Date   ABDOMINAL HYSTERECTOMY     APPENDECTOMY     BACK SURGERY     BRONCHIAL BIOPSY  05/17/2023   Procedure: BRONCHIAL BIOPSIES;  Surgeon: Kristy Peer, MD;  Location: [facility] ENDOSCOPY;  Service: Pulmonary;;   BRONCHIAL BRUSHINGS  05/17/2023   Procedure: BRONCHIAL BRUSHINGS;  Surgeon: Kristy Peer, MD;  Location: [facility] ENDOSCOPY;  Service: Pulmonary;;   BRONCHIAL NEEDLE ASPIRATION BIOPSY  05/17/2023   Procedure: BRONCHIAL NEEDLE ASPIRATION BIOPSIES;  Surgeon: Kristy Peer, MD;  Location: [facility] ENDOSCOPY;  Service: Pulmonary;;   IR IMAGING GUIDED PORT INSERTION  06/03/2023   JOINT REPLACEMENT     TOTAL HIP ARTHROPLASTY     TOTAL KNEE ARTHROPLASTY     VIDEO BRONCHOSCOPY WITH ENDOBRONCHIAL ULTRASOUND Right 05/17/2023   Procedure: VIDEO BRONCHOSCOPY WITH ENDOBRONCHIAL ULTRASOUND;  Surgeon: Kristy Peer, MD;  Location: [facility] ENDOSCOPY;  Service: Pulmonary;  Laterality: Right;     reports that she has been smoking cigarettes. She has a 12.5 pack-year smoking history. She has never used smokeless tobacco. She reports that she does not drink alcohol and does not use drugs.  Allergies  Allergen Reactions   Erythromycin Hives    History reviewed. No pertinent family history.  Prior to  Admission medications   Medication Sig Start Date End Date Taking? Authorizing Provider  albuterol (VENTOLIN HFA) 108 (90 Base) MCG/ACT inhaler Inhale 2 puffs into the lungs every 6 (six) hours as needed for wheezing or shortness of breath. 08/28/21  Yes Kristy Schaumann K, PA-C  FARXIGA 10 MG TABS tablet Take 10 mg by mouth daily. 07/27/23  Yes [provider]  Fluticasone-Umeclidin-Vilant 100-62.5-25 MCG/INH AEPB Inhale 2 application  into the lungs daily as needed.   Yes [provider]  HYDROcodone-acetaminophen (HYCET) 7.5-325 mg/15 ml solution Take 10 mLs by mouth 4 (four) times daily as needed for moderate pain (pain score 4-6). 07/15/23  Yes Kristy Dys, MD  megestrol (MEGACE) 40 MG/ML suspension Take 40 mg by mouth daily. 08/31/23  Yes [provider]  mirtazapine (REMERON) 7.5 MG tablet Take 7.5 mg by mouth at bedtime. 08/30/23  Yes [provider]  montelukast (SINGULAIR) 10 MG tablet Take 10 mg by mouth daily. For seasonal allergies   Yes [provider]  Multiple Vitamin (MULTIVITAMIN WITH MINERALS) TABS tablet Take 1 tablet by mouth daily.   Yes [provider]  prochlorperazine (COMPAZINE) 10 MG tablet TAKE 1 TABLET BY MOUTH EVERY 6 HOURS AS NEEDED FOR NAUSEA FOR VOMITING 08/30/23  Yes Kristy Gaul, MD  rosuvastatin (CRESTOR) 10 MG tablet Take 10 mg by mouth daily.     Yes [provider]  sucralfate (CARAFATE) 1 g tablet Take 1 tablet (1 g total) by mouth 4 (four) times daily -  with meals and at bedtime. Crush 1 tablet in 1 oz water and drink 5 min before meals for radiation induced esophagitis 07/04/23  Yes Kristy Bacon, PA-C  tiotropium (SPIRIVA) 18 MCG inhalation capsule Place 18 mcg into inhaler and inhale daily.   Yes [provider]  potassium chloride SA (KLOR-CON M) 20 MEQ tablet Take 1 tablet (20 mEq total) by mouth daily. Patient not taking: Reported on 09/08/2023 08/15/23   Heilingoetter, Johnette Abraham, PA-C    Physical Exam: Vitals:   09/09/23 0015 09/09/23 0215 09/09/23 0356 09/09/23 0400  BP: (!) 139/91 (!) 149/83  (!) 149/91  Pulse: 88 90  91  Resp: 18 16  (!) 22  Temp: 98.5 F (36.9 C)  98.7 F (37.1 C)   TempSrc:   Oral   SpO2: 97% 96%  97%  Weight:      Height:        Physical Exam Vitals reviewed.  Constitutional:      General: She  is not in acute distress.    Comments: Appears cachectic  HENT:     Head: Normocephalic and atraumatic.  Eyes:     Extraocular Movements: Extraocular movements intact.  Cardiovascular:     Rate and Rhythm: Normal rate and regular rhythm.     Pulses: Normal pulses.  Pulmonary:     Effort: Pulmonary effort is normal. No respiratory distress.     Breath sounds: No wheezing, rhonchi or rales.  Abdominal:     General: Bowel sounds are normal. There is no distension.     Palpations: Abdomen is soft.     Tenderness: There is no abdominal tenderness. There is no guarding or rebound.  Musculoskeletal:     Cervical back: Normal range of motion.     Right lower leg: No edema.     Left lower leg: No edema.  Skin:    General: Skin is warm and dry.  Neurological:     General: No focal  deficit present.     Mental Status: She is alert and oriented to person, place, and time.     Labs on Admission: I have personally reviewed following labs and imaging studies  CBC: Recent Labs  Lab 09/08/23 1954  WBC 4.1  NEUTROABS 2.3  HGB 12.5  HCT 40.3  MCV 95.7  PLT 332   Basic Metabolic Panel: Recent Labs  Lab 09/08/23 1954 09/09/23 0040  NA 147*  --   Salazar 2.9*  --   CL 111  --   CO2 23  --   GLUCOSE 106*  --   BUN 19  --   CREATININE 0.50  --   CALCIUM 9.0  --   MG  --  2.0   GFR: Estimated Creatinine Clearance: 37.4 mL/min (by C-G formula based on SCr of 0.5 mg/dL). Liver Function Tests: Recent Labs  Lab 09/08/23 1954  AST 36  ALT 26  ALKPHOS 99  BILITOT 0.8  PROT 7.7  ALBUMIN 2.7*   Recent Labs  Lab 09/08/23 1954  LIPASE 21   No results for input(s): "AMMONIA" in the last 168 hours. Coagulation Profile: No results for input(s): "INR", "PROTIME" in the last 168 hours. Cardiac Enzymes: No results for input(s): "CKTOTAL", "CKMB", "CKMBINDEX", "TROPONINI" in the last 168 hours. BNP (last 3 results) No results for input(s): "PROBNP" in the last 8760 hours. HbA1C: No  results for input(s): "HGBA1C" in the last 72 hours. CBG: No results for input(s): "GLUCAP" in the last 168 hours. Lipid Profile: No results for input(s): "CHOL", "HDL", "LDLCALC", "TRIG", "CHOLHDL", "LDLDIRECT" in the last 72 hours. Thyroid Function Tests: No results for input(s): "TSH", "T4TOTAL", "FREET4", "T3FREE", "THYROIDAB" in the last 72 hours. Anemia Panel: No results for input(s): "VITAMINB12", "FOLATE", "FERRITIN", "TIBC", "IRON", "RETICCTPCT" in the last 72 hours. Urine analysis:    Component Value Date/Time   COLORURINE YELLOW 09/08/2023 1954   APPEARANCEUR HAZY (A) 09/08/2023 1954   LABSPEC 1.022 09/08/2023 1954   PHURINE 6.0 09/08/2023 1954   GLUCOSEU >=500 (A) 09/08/2023 1954   HGBUR NEGATIVE 09/08/2023 1954   BILIRUBINUR NEGATIVE 09/08/2023 1954   KETONESUR 20 (A) 09/08/2023 1954   PROTEINUR NEGATIVE 09/08/2023 1954   UROBILINOGEN 0.2 05/10/2011 0930   NITRITE NEGATIVE 09/08/2023 1954   LEUKOCYTESUR TRACE (A) 09/08/2023 1954    Radiological Exams on Admission: CT Chest W Contrast Result Date: 09/09/2023 CLINICAL DATA:  Question esophageal pathology, mass, compression. Difficulty swallowing. Chest pain. Lung cancer patient finished radiation treatment last week. EXAM: CT CHEST WITH CONTRAST TECHNIQUE: Multidetector CT imaging of the chest was performed during intravenous contrast administration. RADIATION DOSE REDUCTION: This exam was performed according to the departmental dose-optimization program which includes automated exposure control, adjustment of the mA and/or kV according to patient size and/or use of iterative reconstruction technique. CONTRAST:  75mL OMNIPAQUE IOHEXOL 300 MG/ML  SOLN COMPARISON:  Chest radiograph 09/08/2023 and CT chest 06/14/2023 FINDINGS: Cardiovascular: Coronary artery and aortic atherosclerotic calcification. No pericardial effusion. Right chest wall Port-A-Cath tip in the low SVC. Mediastinum/Nodes: Fluid and debris filling the esophagus  reaching the thoracic inlet. Mild wall thickening and mucosal hyperenhancement in the mid and upper esophagus. Edema within the mediastinum about the mid and upper esophagus. No pneumomediastinum. Trachea is unremarkable. No thoracic adenopathy. Lungs/Pleura: Slightly decreased size of the spiculated mass in the right lower lobe measuring 3.2 x 2.4 cm, previously 3.5 x 2.3 cm. Right lower lobe scarring and architectural distortion. Patchy ground-glass opacities with bronchial wall thickening the right middle  and lower lobe. Advanced emphysema. Biapical pleural-parenchymal scarring. Unchanged 1.3 cm subpleural nodule in the posterior left upper lobe. No pleural effusion or pneumothorax. Upper Abdomen: No acute abnormality. Musculoskeletal: No acute fracture or destructive osseous lesion. IMPRESSION: 1. Findings are consistent with esophagitis, possibly radiation induced given history. No pneumomediastinum. 2. Fluid and debris filling the esophagus reaching the thoracic inlet. 3. Patchy ground-glass opacities with bronchial wall thickening in the right middle and lower lobe, likely infectious or inflammatory and possibly due to aspiration given fluid column in the esophagus. 4. Slightly decreased size of the spiculated mass in the right lower lobe. 5. Unchanged 1.3 cm subpleural nodule in the posterior left upper lobe. Aortic Atherosclerosis (ICD10-I70.0) and Emphysema (ICD10-J43.9). Electronically Signed   By: Minerva Fester M.D.   On: 09/09/2023 02:39   DG Chest Portable 1 View Result Date: 09/08/2023 CLINICAL DATA:  Lung cancer, vomiting, sternal pain EXAM: PORTABLE CHEST 1 VIEW COMPARISON:  CT chest 09/12/2023 and radiographs 05/17/2023 FINDINGS: Right chest wall Port-A-Cath tip in the low SVC. Stable cardiomediastinal silhouette. Aortic atherosclerotic calcification. Grossly similar size of the irregular airspace opacity in the right lower lung compared to CT 08/15/2023. Question 1.4 cm nodule in the left  midlung. Hyperinflation and chronic bronchitic change. Bibasilar atelectasis/scarring. No pleural effusion or pneumothorax. No displaced rib fractures. IMPRESSION: 1. Grossly similar size of the irregular airspace opacity in the right lower lung compared to CT 08/15/2023. 2. Question 1.4 cm nodule in the left midlung. This could be projectional artifact or due to a subpleural nodule seen on 08/15/2023. Continued CT follow-up is recommended. 3. Advanced emphysema. Electronically Signed   By: Minerva Fester M.D.   On: 09/08/2023 22:24    EKG: Independently reviewed.  Sinus rhythm, QTc 535, ST depressions in inferior leads.  Assessment and Plan  Severe dysphagia/odynophagia secondary to suspected radiation esophagitis Patient is reporting difficulty swallowing even liquids, and per review of chart, she has lost >15 pounds weight in the past 2 months.  Continue IV fluid hydration and sucralfate.  Start IV Protonix 40 mg every 12 hours.  IM Tigan PRN nausea/vomiting, avoid QT prolonging antiemetics. I have consulted [facility] as patient may need endoscopy for further evaluation.  Will keep n.p.o. at this time.  Possible aspiration pneumonia Patient is reporting shortness of breath and cough.  CT showing patchy groundglass opacities with bronchial wall thickening in the right middle and lower lobe suspicious for possible aspiration pneumonia in the setting of problem listed above.  No fever, leukocytosis, or signs of sepsis.  No hypoxia.  Blood cultures ordered and start Unasyn.  Aspiration precautions.  COVID test/RVP ordered although low suspicion for viral pneumonia.  Hypokalemia QT prolongation Patient is hypokalemic in the setting of very poor p.o. intake.  Magnesium within normal range.  QTc 535 on EKG.  Continue to monitor potassium level and replace as needed.  Avoid QT prolonging drugs if possible and follow-up repeat EKG in the morning.  Mild hypernatremia Likely due to poor very p.o.  intake/dehydration.  Continue IV fluid hydration and monitor sodium level.  Stage IIIb non-small cell lung cancer/squamous cell carcinoma Diagnosed in September 2024 status post concurrent chemoradiation and currently on immunotherapy.  Outpatient oncology follow-up.  COPD/asthma Stable, no wheezing.  Continue home inhalers.  Type 2 diabetes Check A1c and placed on sensitive sliding scale insulin.  Hypertension Currently normotensive.  Not on antihypertensives at home.  DVT prophylaxis: SCDs until patient is evaluated by GI Code Status: Full Code (discussed with  the patient and her daughter) Family Communication: Daughter at bedside. Consults called: [facility] (Dr. Lorenso Quarry) Level of care: Telemetry bed Admission status: It is my clinical opinion that referral for OBSERVATION is reasonable and necessary in this patient based on the above information provided. The aforementioned taken together are felt to place the patient at high risk for further clinical deterioration. However, it is anticipated that the patient may be medically stable for discharge from the hospital within 24 to 48 hours.  John Giovanni MD [facility]  If 7PM-7AM, please contact night-coverage [email or web address]  09/09/2023, 5:33 AM

## 2023-09-09 NOTE — Progress Notes (Signed)
 PROGRESS NOTE  MADDISYN HEGWOOD ZOX:[id number] DOB: June 05, 1941 DOA: 09/08/2023 PCP: [name], MD  HPI/Recap of past 24 hours: Kristy Salazar is a 83 y.o. female with medical history significant of stage IIIb non-small cell lung cancer/squamous cell carcinoma diagnosed in September 2024 status post concurrent chemoradiation and currently on immunotherapy, COPD/asthma, type 2 diabetes, hypertension, hyperlipidemia presented to ED with complaints of nausea, vomiting, and difficulty swallowing for several weeks with significant weight loss.  Unable to keep anything down even liquids. VS fairly stable. Labs showed potassium 2.9, UA with negative nitrite, trace leukocytes, and microscopy showing 6-10 WBCs and no bacteria.  Troponin negative x 2.  CT chest with contrast showing findings consistent with esophagitis, possibly radiation-induced given history, also showed patchy groundglass opacities with bronchial wall thickening in the right middle and lower lobe suspicious for possible aspiration pneumonia. TRH called to admit.    Today, saw patient, daughter at bedside.  Noted to be very cachectic and frail.  Attempted to drink some orange juice due to hypoglycemia, but vomited it out.  Requesting to try antiemetics before attempting food/meds as that seems to help her.  Guarded prognosis.   Assessment/Plan: Principal Problem:   Radiation esophagitis Active Problems:   COPD (chronic obstructive pulmonary disease) (HCC)   Primary squamous cell carcinoma of bronchus of right lower lobe (HCC)   Dysphagia   Aspiration pneumonia (HCC)   Hypokalemia   QT prolongation   Hypernatremia   Severe dysphagia/odynophagia secondary to possible radiation esophagitis CT chest with contrast showing findings consistent with esophagitis, possibly radiation-induced given history GI on board, due to poor candidacy for endoscopic evaluation, will attempt medical management and if symptoms persist, plan for EGD next  week. SLP consulted in the meantime Continue IVF, sucralfate, Protonix and antiemetics Clear liquid diet for now, aspiration precautions   Possible aspiration pneumonia Currently afebrile, with no leukocytosis CT showing patchy groundglass opacities with bronchial wall thickening in the right middle and lower lobe suspicious for possible aspiration pneumonia Covid neg, resp viral panel pending Continue IV Unasyn Aspiration precautions   Hypokalemia QT prolongation Continue to monitor potassium level and replace as needed Avoid QT prolonging drugs if possible   Stage IIIb non-small cell lung cancer/squamous cell carcinoma Diagnosed in September 2024 status post concurrent chemoradiation and currently on immunotherapy Outpatient oncology follow-up.   COPD/asthma Stable, no wheezing.  Continue home inhalers.   Type 2 diabetes, uncontrolled with hypoglycemia Likely 2/2 poor oral intake  A1c pending SSI, Accu-Cheks, hypoglycemic protocol   Hypertension Currently normotensive.  Not on antihypertensives at home.  Malnutrition Likely 2/2 poor oral intake with underlying malignancy Dietitian consulted  Goals of care discussion Patient with very poor prognosis Will need palliative/hospice consultation during this admission pending on clinical course     Estimated body mass index is 12.97 kg/m as calculated from the following:   Height as of this encounter: 5\' 11"  (1.803 m).   Weight as of this encounter: 42.2 kg.     Code Status: Full  Family Communication: Discussed with daughter at bedside  Disposition Plan: Status is: Observation The patient will require care spanning > 2 midnights and should be moved to inpatient because: Level of care      Consultants: GI  Procedures: None  Antimicrobials: Unasyn  DVT prophylaxis: Lovenox   Objective: Vitals:   09/09/23 0730 09/09/23 1000 09/09/23 1133 09/09/23 1300  BP: 138/80 119/72  127/77  Pulse: 90 93  81   Resp: 19 19  20  Temp: 98.4 F (36.9 C)  98.6 F (37 C)   TempSrc: Oral     SpO2: 97% 97%  97%  Weight:      Height:        Intake/Output Summary (Last 24 hours) at 09/09/2023 1313 Last data filed at 09/09/2023 4696 Gross per 24 hour  Intake 400 ml  Output --  Net 400 ml   Filed Weights   09/08/23 1927  Weight: 42.2 kg    Exam: General: NAD, frail, very cachectic Cardiovascular: S1, S2 present Respiratory: CTAB Abdomen: Soft, nontender, nondistended, bowel sounds present Musculoskeletal: No bilateral pedal edema noted Skin: Normal Psychiatry: Normal mood     Data Reviewed: CBC: Recent Labs  Lab 09/08/23 1954  WBC 4.1  NEUTROABS 2.3  HGB 12.5  HCT 40.3  MCV 95.7  PLT 332   Basic Metabolic Panel: Recent Labs  Lab 09/08/23 1954 09/09/23 0040 09/09/23 0657  NA 147*  --  140  K 2.9*  --  2.9*  CL 111  --  104  CO2 23  --  24  GLUCOSE 106*  --  74  BUN 19  --  11  CREATININE 0.50  --  0.49  CALCIUM 9.0  --  8.4*  MG  --  2.0  --    GFR: Estimated Creatinine Clearance: 37.4 mL/min (by C-G formula based on SCr of 0.49 mg/dL). Liver Function Tests: Recent Labs  Lab 09/08/23 1954  AST 36  ALT 26  ALKPHOS 99  BILITOT 0.8  PROT 7.7  ALBUMIN 2.7*   Recent Labs  Lab 09/08/23 1954  LIPASE 21   No results for input(s): "AMMONIA" in the last 168 hours. Coagulation Profile: No results for input(s): "INR", "PROTIME" in the last 168 hours. Cardiac Enzymes: No results for input(s): "CKTOTAL", "CKMB", "CKMBINDEX", "TROPONINI" in the last 168 hours. BNP (last 3 results) No results for input(s): "PROBNP" in the last 8760 hours. HbA1C: No results for input(s): "HGBA1C" in the last 72 hours. CBG: Recent Labs  Lab 09/09/23 0809 09/09/23 1303  GLUCAP 65* 101*   Lipid Profile: No results for input(s): "CHOL", "HDL", "LDLCALC", "TRIG", "CHOLHDL", "LDLDIRECT" in the last 72 hours. Thyroid Function Tests: No results for input(s): "TSH", "T4TOTAL",  "FREET4", "T3FREE", "THYROIDAB" in the last 72 hours. Anemia Panel: No results for input(s): "VITAMINB12", "FOLATE", "FERRITIN", "TIBC", "IRON", "RETICCTPCT" in the last 72 hours. Urine analysis:    Component Value Date/Time   COLORURINE YELLOW 09/08/2023 1954   APPEARANCEUR HAZY (A) 09/08/2023 1954   LABSPEC 1.022 09/08/2023 1954   PHURINE 6.0 09/08/2023 1954   GLUCOSEU >=500 (A) 09/08/2023 1954   HGBUR NEGATIVE 09/08/2023 1954   BILIRUBINUR NEGATIVE 09/08/2023 1954   KETONESUR 20 (A) 09/08/2023 1954   PROTEINUR NEGATIVE 09/08/2023 1954   UROBILINOGEN 0.2 05/10/2011 0930   NITRITE NEGATIVE 09/08/2023 1954   LEUKOCYTESUR TRACE (A) 09/08/2023 1954   Sepsis Labs: @LABRCNTIP (procalcitonin:4,lacticidven:4)  ) Recent Results (from the past 240 hours)  SARS Coronavirus 2 by RT PCR (hospital order, performed in [facility] hospital lab) *cepheid single result test* Nasopharyngeal Swab     Status: None   Collection Time: 09/09/23  6:57 AM   Specimen: Nasopharyngeal Swab; Nasal Swab  Result Value Ref Range Status   SARS Coronavirus 2 by RT PCR NEGATIVE NEGATIVE Final    Comment: (NOTE) SARS-CoV-2 target nucleic acids are NOT DETECTED.  The SARS-CoV-2 RNA is generally detectable in upper and lower respiratory specimens during the acute phase of infection.  The lowest concentration of SARS-CoV-2 viral copies this assay can detect is 250 copies / mL. A negative result does not preclude SARS-CoV-2 infection and should not be used as the sole basis for treatment or other patient management decisions.  A negative result may occur with improper specimen collection / handling, submission of specimen other than nasopharyngeal swab, presence of viral mutation(s) within the areas targeted by this assay, and inadequate number of viral copies (<250 copies / mL). A negative result must be combined with clinical observations, patient history, and epidemiological information.  Fact Sheet for  Patients:   RoadLapTop.co.za  Fact Sheet for Healthcare Providers: http://kim-miller.com/  This test is not yet approved or  cleared by the [place] FDA and has been authorized for detection and/or diagnosis of SARS-CoV-2 by FDA under an Emergency Use Authorization (EUA).  This EUA will remain in effect (meaning this test can be used) for the duration of the COVID-19 declaration under Section 564(b)(1) of the Act, 21 U.S.C. section 360bbb-3(b)(1), unless the authorization is terminated or revoked sooner.  Performed at [facility], [address] [place], [place] [address]       Studies: CT Chest W Contrast Result Date: 09/09/2023 CLINICAL DATA:  Question esophageal pathology, mass, compression. Difficulty swallowing. Chest pain. Lung cancer patient finished radiation treatment last week. EXAM: CT CHEST WITH CONTRAST TECHNIQUE: Multidetector CT imaging of the chest was performed during intravenous contrast administration. RADIATION DOSE REDUCTION: This exam was performed according to the departmental dose-optimization program which includes automated exposure control, adjustment of the mA and/or kV according to patient size and/or use of iterative reconstruction technique. CONTRAST:  75mL OMNIPAQUE IOHEXOL 300 MG/ML  SOLN COMPARISON:  Chest radiograph 09/08/2023 and CT chest 06/14/2023 FINDINGS: Cardiovascular: Coronary artery and aortic atherosclerotic calcification. No pericardial effusion. Right chest wall Port-A-Cath tip in the low SVC. Mediastinum/Nodes: Fluid and debris filling the esophagus reaching the thoracic inlet. Mild wall thickening and mucosal hyperenhancement in the mid and upper esophagus. Edema within the mediastinum about the mid and upper esophagus. No pneumomediastinum. Trachea is unremarkable. No thoracic adenopathy. Lungs/Pleura: Slightly decreased size of the spiculated mass in the right lower lobe  measuring 3.2 x 2.4 cm, previously 3.5 x 2.3 cm. Right lower lobe scarring and architectural distortion. Patchy ground-glass opacities with bronchial wall thickening the right middle and lower lobe. Advanced emphysema. Biapical pleural-parenchymal scarring. Unchanged 1.3 cm subpleural nodule in the posterior left upper lobe. No pleural effusion or pneumothorax. Upper Abdomen: No acute abnormality. Musculoskeletal: No acute fracture or destructive osseous lesion. IMPRESSION: 1. Findings are consistent with esophagitis, possibly radiation induced given history. No pneumomediastinum. 2. Fluid and debris filling the esophagus reaching the thoracic inlet. 3. Patchy ground-glass opacities with bronchial wall thickening in the right middle and lower lobe, likely infectious or inflammatory and possibly due to aspiration given fluid column in the esophagus. 4. Slightly decreased size of the spiculated mass in the right lower lobe. 5. Unchanged 1.3 cm subpleural nodule in the posterior left upper lobe. Aortic Atherosclerosis (ICD10-I70.0) and Emphysema (ICD10-J43.9). Electronically Signed   By: Minerva Fester M.D.   On: 09/09/2023 02:39   DG Chest Portable 1 View Result Date: 09/08/2023 CLINICAL DATA:  Lung cancer, vomiting, sternal pain EXAM: PORTABLE CHEST 1 VIEW COMPARISON:  CT chest 09/12/2023 and radiographs 05/17/2023 FINDINGS: Right chest wall Port-A-Cath tip in the low SVC. Stable cardiomediastinal silhouette. Aortic atherosclerotic calcification. Grossly similar size of the irregular airspace opacity in the right lower lung compared to  CT 08/15/2023. Question 1.4 cm nodule in the left midlung. Hyperinflation and chronic bronchitic change. Bibasilar atelectasis/scarring. No pleural effusion or pneumothorax. No displaced rib fractures. IMPRESSION: 1. Grossly similar size of the irregular airspace opacity in the right lower lung compared to CT 08/15/2023. 2. Question 1.4 cm nodule in the left midlung. This could be  projectional artifact or due to a subpleural nodule seen on 08/15/2023. Continued CT follow-up is recommended. 3. Advanced emphysema. Electronically Signed   By: Minerva Fester M.D.   On: 09/08/2023 22:24    Scheduled Meds:  insulin aspart  0-9 Units Subcutaneous Q4H   pantoprazole (PROTONIX) IV  40 mg Intravenous Q12H   sucralfate  1 g Oral TID WC & HS   umeclidinium bromide  1 puff Inhalation Daily    Continuous Infusions:  ampicillin-sulbactam (UNASYN) IV Stopped (09/09/23 0916)   dextrose 5 % and 0.45 % NaCl 100 mL/hr at 09/09/23 1135   potassium chloride 10 mEq (09/09/23 1132)     LOS: 0 days     Briant Cedar, MD [facility]  If 7PM-7AM, please contact night-coverage [email or web address] 09/09/2023, 1:13 PM

## 2023-09-09 NOTE — Telephone Encounter (Signed)
 2/28 Received fax from [facility] oncall center.  Patient stated appetite decreasing, was advice to go to Emergency dept.  Fax given to R[facility] Favors, so they are aware

## 2023-09-09 NOTE — Evaluation (Signed)
 Clinical/Bedside Swallow Evaluation Patient Details  Name: Kristy Salazar MRN: [id number] Date of Birth: October 14, 1940  Today's Date: 09/09/2023 Time: SLP Start Time (ACUTE ONLY): 1820 SLP Stop Time (ACUTE ONLY): 1835 SLP Time Calculation (min) (ACUTE ONLY): 15 min  Past Medical History:  Past Medical History:  Diagnosis Date   Arthritis    Asthma    COPD (chronic obstructive pulmonary disease) (HCC)    Diabetes mellitus    patient denies   Hypertension    Seasonal allergies    Past Surgical History:  Past Surgical History:  Procedure Laterality Date   ABDOMINAL HYSTERECTOMY     APPENDECTOMY     BACK SURGERY     BRONCHIAL BIOPSY  05/17/2023   Procedure: BRONCHIAL BIOPSIES;  Surgeon: [name], MD;  Location: [facility];  Service: Pulmonary;;   BRONCHIAL BRUSHINGS  05/17/2023   Procedure: BRONCHIAL BRUSHINGS;  Surgeon: [name], MD;  Location: [facility];  Service: Pulmonary;;   BRONCHIAL NEEDLE ASPIRATION BIOPSY  05/17/2023   Procedure: BRONCHIAL NEEDLE ASPIRATION BIOPSIES;  Surgeon: [name], MD;  Location: [facility];  Service: Pulmonary;;   IR IMAGING GUIDED PORT INSERTION  06/03/2023   JOINT REPLACEMENT     TOTAL HIP ARTHROPLASTY     TOTAL KNEE ARTHROPLASTY     VIDEO BRONCHOSCOPY WITH ENDOBRONCHIAL ULTRASOUND Right 05/17/2023   Procedure: VIDEO BRONCHOSCOPY WITH ENDOBRONCHIAL ULTRASOUND;  Surgeon: [name], MD;  Location: [facility];  Service: Pulmonary;  Laterality: Right;   HPI:  Per Md note "Pt is an 83 yo female adm to [facility] from cancer center - diagnosed with radiation esophagitis, GI saw her and are treating her for radiation esophagitis with possible plans to scope her next week per chart review. Pt with PMH + for  lung cancer on chemoradiation, last dose about 4-6 weeks ago, also on immunotherapy.  About 2-3 weeks ago, developed dysphagia and odynophagia and this has not improved per pt's daughter.  Pt reports she can not swallow any type  or consistency of foods without getting stuck in esophagus and throwing back up, but is able to get down (with some difficulties) her medications.  Has lost about 30 lbs in the past few weeks.  Feels very week.  No blood in stool or hematemesis.   CT chest showed "Fluid and debris filling the esophagus reaching the thoracic inlet. 3. Patchy ground-glass opacities with bronchial wall thickening in the right middle and lower lobe, likely infectious or inflammatory and possibly due to aspiration given fluid column in the esophagus." MRI brain 05/2023 Mild chronic small vessel ischemic changes within the cerebral white matter and pons.  Swallow eval ordered to evaluate oropharyngeal swallow.    Assessment / Plan / Recommendation  Clinical Impression  Limited assessment completed due to patient not being able to get her Zofran until 8:30 PM and it being approximately 630 during session.  However there are no overt indication of pharyngeal dysphagia patient's symptoms all are consistent with suspected esophageal deficits.  She does appear with white coating on tongue concerning for potential candidiasis, even after patient brushing her tongue.  No focal cranial nerve deficits noted though patient does have remote history of pons CVA.  Observed patient swallowing single bolus of thin water followed immediately by her regurgitating and expectorating -including copious frothy secretions.  She points to her mid esophagus to indicate area of retention/deficits with all po intake.  Patient denies having issues with her swallowing in her pharynx and her voice  is strong *not indicative of RLN impairment.    SLP does not suspect patient has a significant oral pharyngeal dysphagia per patient signs/symptoms, though candidiasis may slightly exacerbate her dysphagia.   Note per CT of chest today "Fluid and debris filling the esophagus reaching the thoracic  inlet" and Findings are consistent with esophagitis, possibly radiation   induced given history. No pneumomediastinum."   Per daughter patient is willing to receive a feeding tube if it is needed.  Recommend continue diet per GI and treat candidiasis if it's present if not contraindicated.    SLP set up oral suction for patient and she demonstrated its use.  Will sign off, thanks for this consult. SLP Visit Diagnosis: Dysphagia, unspecified (R13.10)    Aspiration Risk  Other (comment);Risk for inadequate nutrition/hydration (Aspiration risk due to esophageal dysphagia)    Diet Recommendation Thin liquid    Liquid Administration via: Cup;Straw Medication Administration: Other (Comment) (As tolerated) Supervision: Patient able to self feed Compensations: Slow rate;Small sips/bites Postural Changes: Seated upright at 90 degrees;Remain upright for at least 30 minutes after po intake    Other  Recommendations Oral Care Recommendations: Oral care BID    Recommendations for follow up therapy are one component of a multi-disciplinary discharge planning process, led by the attending physician.  Recommendations may be updated based on patient status, additional functional criteria and insurance authorization.  Follow up Recommendations No SLP follow up      Assistance Recommended at Discharge  N/a  Functional Status Assessment Patient has had a recent decline in their functional status and demonstrates the ability to make significant improvements in function in a reasonable and predictable amount of time. (GI treating patient for esophagitis)  Frequency and Duration            Prognosis    N/a    Swallow Study   General Date of Onset: 09/09/23 HPI: Per Md note "Pt is an 83 yo female adm to [facility] from cancer center - diagnosed with radiation esophagitis, GI saw her and are treating her for radiation esophagitis with possible plans to scope her next week per chart review. Pt with PMH + for  lung cancer on chemoradiation, last dose about 4-6 weeks ago, also on  immunotherapy.  About 2-3 weeks ago, developed dysphagia and odynophagia and this has not improved per pt's daughter.  Pt reports she can not swallow any type or consistency of foods without getting stuck in esophagus and throwing back up, but is able to get down (with some difficulties) her medications.  Has lost about 30 lbs in the past few weeks.  Feels very week.  No blood in stool or hematemesis.   CT chest showed "Fluid and debris filling the esophagus reaching the thoracic inlet. 3. Patchy ground-glass opacities with bronchial wall thickening in the right middle and lower lobe, likely infectious or inflammatory and possibly due to aspiration given fluid column in the esophagus." MRI brain 05/2023 Mild chronic small vessel ischemic changes within the cerebral white matter and pons.  Swallow eval ordered to evaluate oropharyngeal swallow. Type of Study: Bedside Swallow Evaluation Diet Prior to this Study: Thin liquids (Level 0) (clear liquids) Respiratory Status: Nasal cannula History of Recent Intubation: No Behavior/Cognition: Alert;Cooperative;Pleasant mood Oral Care Completed by SLP: Yes (pt provided with toothbrush/paste and cleaned her mouth, coating remained however) Oral Cavity - Dentition: Adequate natural dentition Vision: Functional for self-feeding Baseline Vocal Quality: Normal Volitional Cough: Strong Volitional Swallow:  (DNT)    Oral/Motor/Sensory  Function Overall Oral Motor/Sensory Function: Within functional limits   Ice Chips Ice chips: Not tested   Thin Liquid Thin Liquid: Impaired Presentation: Straw Other Comments: immediate expectoration post-swallow of frothy secretions - this likely indicates esophageal deficits  Did not provide more po as pt unable to have Zofran until 830 pm and concern for expectoration with aspiration present.   Nectar Thick Nectar Thick Liquid: Not tested   Honey Thick Honey Thick Liquid: Not tested   Puree Puree: Not tested   Solid      Solid: Not tested      Chales Abrahams 09/09/2023,7:11 PM  Rolena Infante, MS [facility] SLP Acute Rehab Services Office [phone]

## 2023-09-09 NOTE — ED Notes (Signed)
 Pt's daughter assisted Pt to the bathroom and states that she stepped out to give Pt her privacy when she heard a bump. Daughter made this RN aware and at this time Pt was found on her bottom sitting on the floor. Pt states that when she went for the grab bar she missed it and her legs gave out on her. Pt denies any lightheadedness or dizziness prior to the fall. She does state that she bumped her head slightly on the left side behind her ear. Pt assisted onto commode with this RN and NT remaining within arms reach and then assisted into the wheelchair afterwards. Providers Pollina and Schlossman made aware. Pt is not on any blood thinners and doesn't report a h/a or any further pain at this time. Schlossman to bedside to assess the Pt. Per providers no CT at this time. Pt will continue to be monitored and was educated to report any new s/sx of pain, h/a or nausea. Pt remains on bedside monitor.

## 2023-09-09 NOTE — Progress Notes (Signed)
 Pharmacy Antibiotic Note  Kristy Salazar is a 83 y.o. female admitted on 09/08/2023 with aspiration pneumonia. CT showing patchy groundglass opacities with bronchial wall thickening in the right middle and lower lobe suspicious for possible aspiration pneumonia. Pharmacy has been consulted for Unasyn dosing.  Plan: Start Unasyn 3g IV q6hrs  Monitor renal function, cultures, and overall clinical picture  Height: 5\' 11"  (180.3 cm) Weight: 42.2 kg (93 lb) IBW/kg (Calculated) : 70.8  Temp (24hrs), Avg:98.4 F (36.9 C), Min:97.9 F (36.6 C), Max:98.7 F (37.1 C)  Recent Labs  Lab 09/08/23 1954  WBC 4.1  CREATININE 0.50    Estimated Creatinine Clearance: 37.4 mL/min (by C-G formula based on SCr of 0.5 mg/dL).    Allergies  Allergen Reactions   Erythromycin Hives    Antimicrobials this admission: 2/28 Unasyn >>   Dose adjustments this admission: N/A  Microbiology results: 2/28 BCx: not yet collected 2/28 RVP: not yet collected   Thank you for allowing pharmacy to be a part of this patient's care.  Cherylin Mylar, PharmD Clinical Pharmacist  2/28/20257:07 AM

## 2023-09-09 NOTE — Consult Note (Signed)
 [facility] Consultation Note  Referring Provider: [facility] Primary Care Physician:  [name], MD Primary Gastroenterologist:  Gentry Fitz  Reason for Consultation:  dysphagia, odynophagia, failure to thrive  HPI: Kristy Salazar is a 83 y.o. female admitted above problems.  Lung cancer on chemoradiation, last dose about 4-6 weeks ago, also on immunotherapy.  About 2-3 weeks ago, developed dysphagia and odynophagia.  Can not swallow any type or consistency of foods without getting stuck in esophagus and throwing back up, but is able to get down (with some difficulties) her medications.  Has lost about 30 lbs in the past few weeks.  Feels very week.  No blood in stool or hematemesis.   Past Medical History:  Diagnosis Date   Arthritis    Asthma    COPD (chronic obstructive pulmonary disease) (HCC)    Diabetes mellitus    patient denies   Hypertension    Seasonal allergies     Past Surgical History:  Procedure Laterality Date   ABDOMINAL HYSTERECTOMY     APPENDECTOMY     BACK SURGERY     BRONCHIAL BIOPSY  05/17/2023   Procedure: BRONCHIAL BIOPSIES;  Surgeon: [name], MD;  Location: [facility] ENDOSCOPY;  Service: Pulmonary;;   BRONCHIAL BRUSHINGS  05/17/2023   Procedure: BRONCHIAL BRUSHINGS;  Surgeon: [name], MD;  Location: [facility] ENDOSCOPY;  Service: Pulmonary;;   BRONCHIAL NEEDLE ASPIRATION BIOPSY  05/17/2023   Procedure: BRONCHIAL NEEDLE ASPIRATION BIOPSIES;  Surgeon: [name], MD;  Location: [facility] ENDOSCOPY;  Service: Pulmonary;;   IR IMAGING GUIDED PORT INSERTION  06/03/2023   JOINT REPLACEMENT     TOTAL HIP ARTHROPLASTY     TOTAL KNEE ARTHROPLASTY     VIDEO BRONCHOSCOPY WITH ENDOBRONCHIAL ULTRASOUND Right 05/17/2023   Procedure: VIDEO BRONCHOSCOPY WITH ENDOBRONCHIAL ULTRASOUND;  Surgeon: [name], MD;  Location: [facility] ENDOSCOPY;  Service: Pulmonary;  Laterality: Right;    Prior to Admission medications   Medication Sig Start Date End Date  Taking? Authorizing Provider  albuterol (VENTOLIN HFA) 108 (90 Base) MCG/ACT inhaler Inhale 2 puffs into the lungs every 6 (six) hours as needed for wheezing or shortness of breath. 08/28/21  Yes Cheron Schaumann K, PA-C  FARXIGA 10 MG TABS tablet Take 10 mg by mouth daily. 07/27/23  Yes [provider]  Fluticasone-Umeclidin-Vilant 100-62.5-25 MCG/INH AEPB Inhale 2 application  into the lungs daily as needed.   Yes [provider]  HYDROcodone-acetaminophen (HYCET) 7.5-325 mg/15 ml solution Take 10 mLs by mouth 4 (four) times daily as needed for moderate pain (pain score 4-6). 07/15/23  Yes Margaretmary Dys, MD  megestrol (MEGACE) 40 MG/ML suspension Take 40 mg by mouth daily. 08/31/23  Yes [provider]  mirtazapine (REMERON) 7.5 MG tablet Take 7.5 mg by mouth at bedtime. 08/30/23  Yes [provider]  montelukast (SINGULAIR) 10 MG tablet Take 10 mg by mouth daily. For seasonal allergies   Yes [provider]  Multiple Vitamin (MULTIVITAMIN WITH MINERALS) TABS tablet Take 1 tablet by mouth daily.   Yes [provider]  prochlorperazine (COMPAZINE) 10 MG tablet TAKE 1 TABLET BY MOUTH EVERY 6 HOURS AS NEEDED FOR NAUSEA FOR VOMITING 08/30/23  Yes Si Gaul, MD  rosuvastatin (CRESTOR) 10 MG tablet Take 10 mg by mouth daily.     Yes [provider]  sucralfate (CARAFATE) 1 g tablet Take 1 tablet (1 g total) by mouth 4 (four) times daily -  with meals and at bedtime. Crush 1 tablet  in 1 oz water and drink 5 min before meals for radiation induced esophagitis 07/04/23  Yes Ronny Bacon, PA-C  tiotropium [facility]) 18 MCG inhalation capsule Place 18 mcg into inhaler and inhale daily.   Yes [provider]  potassium chloride SA (KLOR-CON M) 20 MEQ tablet Take 1 tablet (20 mEq total) by mouth daily. Patient not taking: Reported on 09/08/2023 08/15/23   Heilingoetter, Cassandra L, PA-C    Current Facility-Administered Medications   Medication Dose Route Frequency Provider Last Rate Last Admin   albuterol (PROVENTIL) (2.5 MG/3ML) 0.083% nebulizer solution 2.5 mg  2.5 mg Nebulization Q6H PRN Briant Cedar, MD       Ampicillin-Sulbactam (UNASYN) 3 g in sodium chloride 0.9 % 100 mL IVPB  3 g Intravenous Q6H Cherylin Mylar, RPH 200 mL/hr at 09/09/23 0846 3 g at 09/09/23 0846   dextrose 5 % and 0.45 % NaCl infusion   Intravenous Continuous Briant Cedar, MD       insulin aspart (novoLOG) injection 0-9 Units  0-9 Units Subcutaneous Q4H John Giovanni, MD       ondansetron [facility]) injection 4 mg  4 mg Intravenous Q8H PRN Briant Cedar, MD       pantoprazole (PROTONIX) injection 40 mg  40 mg Intravenous Q12H John Giovanni, MD   40 mg at 09/09/23 0844   potassium chloride 10 mEq in 100 mL IVPB  10 mEq Intravenous Q1 Hr x 4 Ezenduka, Monica Martinez, MD       sucralfate (CARAFATE) 1 GM/10ML suspension 1 g  1 g Oral TID WC & HS Briant Cedar, MD       trimethobenzamide Dorothey Baseman) injection 200 mg  200 mg Intramuscular Q8H PRN John Giovanni, MD       umeclidinium bromide (INCRUSE ELLIPTA) 62.5 MCG/ACT 1 puff  1 puff Inhalation Daily Briant Cedar, MD       Current Outpatient Medications  Medication Sig Dispense Refill   albuterol (VENTOLIN HFA) 108 (90 Base) MCG/ACT inhaler Inhale 2 puffs into the lungs every 6 (six) hours as needed for wheezing or shortness of breath. 8 g 2   FARXIGA 10 MG TABS tablet Take 10 mg by mouth daily.     Fluticasone-Umeclidin-Vilant 100-62.5-25 MCG/INH AEPB Inhale 2 application  into the lungs daily as needed.     HYDROcodone-acetaminophen (HYCET) 7.5-325 mg/15 ml solution Take 10 mLs by mouth 4 (four) times daily as needed for moderate pain (pain score 4-6). 120 mL 0   megestrol (MEGACE) 40 MG/ML suspension Take 40 mg by mouth daily.     mirtazapine (REMERON) 7.5 MG tablet Take 7.5 mg by mouth at bedtime.     montelukast (SINGULAIR) 10 MG tablet Take 10 mg by mouth  daily. For seasonal allergies     Multiple Vitamin (MULTIVITAMIN WITH MINERALS) TABS tablet Take 1 tablet by mouth daily.     prochlorperazine (COMPAZINE) 10 MG tablet TAKE 1 TABLET BY MOUTH EVERY 6 HOURS AS NEEDED FOR NAUSEA FOR VOMITING 30 tablet 0   rosuvastatin (CRESTOR) 10 MG tablet Take 10 mg by mouth daily.       sucralfate (CARAFATE) 1 g tablet Take 1 tablet (1 g total) by mouth 4 (four) times daily -  with meals and at bedtime. Crush 1 tablet in 1 oz water and drink 5 min before meals for radiation induced esophagitis 120 tablet 2   tiotropium (SPIRIVA) 18 MCG inhalation capsule Place 18 mcg into inhaler and inhale daily.  potassium chloride SA (KLOR-CON M) 20 MEQ tablet Take 1 tablet (20 mEq total) by mouth daily. (Patient not taking: Reported on 09/08/2023) 7 tablet 0    Allergies as of 09/08/2023 - Review Complete 09/08/2023  Allergen Reaction Noted   Erythromycin Hives 05/14/2011    History reviewed. No pertinent family history.  Social History   Socioeconomic History   Marital status: Legally Separated    Spouse name: Not on file   Number of children: Not on file   Years of education: Not on file   Highest education level: Not on file  Occupational History   Not on file  Tobacco Use   Smoking status: Every Day    Current packs/day: 0.25    Average packs/day: 0.3 packs/day for 50.0 years (12.5 ttl pk-yrs)    Types: Cigarettes   Smokeless tobacco: Never   Tobacco comments:    Smoking 2-3 cigarettes a day as of 05/25/2023.  am  Vaping Use   Vaping status: Never Used  Substance and Sexual Activity   Alcohol use: No   Drug use: No   Sexual activity: Not on file  Other Topics Concern   Not on file  Social History Narrative   Not on file   Social Drivers of Health   Financial Resource Strain: Not on file  Food Insecurity: No Food Insecurity (05/27/2023)   Hunger Vital Sign    Worried About Running Out of Food in the Last Year: Never true    Ran Out of Food  in the Last Year: Never true  Transportation Needs: No Transportation Needs (05/27/2023)   PRAPARE - Administrator, Civil Service (Medical): No    Lack of Transportation (Non-Medical): No  Physical Activity: Not on file  Stress: Not on file  Social Connections: Not on file  Intimate Partner Violence: Not At Risk (05/27/2023)   Humiliation, Afraid, Rape, and Kick questionnaire    Fear of Current or Ex-Partner: No    Emotionally Abused: No    Physically Abused: No    Sexually Abused: No    Review of Systems: As per HPI, all others negative  Physical Exam: Vital signs in last 24 hours: Temp:  [97.9 F (36.6 C)-98.7 F (37.1 C)] 98.4 F (36.9 C) (02/28 0730) Pulse Rate:  [80-105] 93 (02/28 1000) Resp:  [16-22] 19 (02/28 1000) BP: (119-149)/(72-91) 119/72 (02/28 1000) SpO2:  [96 %-100 %] 97 % (02/28 1000) Weight:  [42.2 kg] 42.2 kg (02/27 1927)   General:   Alert, frail, thin and fragile-appearing Head:  Normocephalic and atraumatic. Eyes:  Sclera clear, no icterus.   Conjunctiva pink. Ears:  Normal auditory acuity. Nose:  No deformity, discharge,  or lesions. Mouth:  No deformity or lesions.  Oropharynx pale and dry Neck:  Supple; no masses or thyromegaly. Lungs:  No visible respiratory distress Abdomen:  Soft, nontender and nondistended. No masses, hepatosplenomegaly or hernias noted. Normal bowel sounds, without guarding, and without rebound.     Msk:  cachectic, muscular wasting but symmetrical without gross deformities. Normal posture. Pulses:  Normal pulses noted. Extremities:  Without clubbing or edema. Neurologic:  Alert and  oriented x4; diffusely weak, otherwise grossly normal neurologically. Skin:  Intact without significant lesions or rashes. Psych:  Alert and cooperative. Normal mood and affect.   Lab Results: Recent Labs    09/08/23 1954  WBC 4.1  HGB 12.5  HCT 40.3  PLT 332   BMET Recent Labs    09/08/23 1954 09/09/23 0657  NA 147*  140  K 2.9* 2.9*  CL 111 104  CO2 23 24  GLUCOSE 106* 74  BUN 19 11  CREATININE 0.50 0.49  CALCIUM 9.0 8.4*   LFT Recent Labs    09/08/23 1954  PROT 7.7  ALBUMIN 2.7*  AST 36  ALT 26  ALKPHOS 99  BILITOT 0.8   PT/INR No results for input(s): "LABPROT", "INR" in the last 72 hours.  Studies/Results: CT Chest W Contrast Result Date: 09/09/2023 CLINICAL DATA:  Question esophageal pathology, mass, compression. Difficulty swallowing. Chest pain. Lung cancer patient finished radiation treatment last week. EXAM: CT CHEST WITH CONTRAST TECHNIQUE: Multidetector CT imaging of the chest was performed during intravenous contrast administration. RADIATION DOSE REDUCTION: This exam was performed according to the departmental dose-optimization program which includes automated exposure control, adjustment of the mA and/or kV according to patient size and/or use of iterative reconstruction technique. CONTRAST:  75mL OMNIPAQUE IOHEXOL 300 MG/ML  SOLN COMPARISON:  Chest radiograph 09/08/2023 and CT chest 06/14/2023 FINDINGS: Cardiovascular: Coronary artery and aortic atherosclerotic calcification. No pericardial effusion. Right chest wall Port-A-Cath tip in the low SVC. Mediastinum/Nodes: Fluid and debris filling the esophagus reaching the thoracic inlet. Mild wall thickening and mucosal hyperenhancement in the mid and upper esophagus. Edema within the mediastinum about the mid and upper esophagus. No pneumomediastinum. Trachea is unremarkable. No thoracic adenopathy. Lungs/Pleura: Slightly decreased size of the spiculated mass in the right lower lobe measuring 3.2 x 2.4 cm, previously 3.5 x 2.3 cm. Right lower lobe scarring and architectural distortion. Patchy ground-glass opacities with bronchial wall thickening the right middle and lower lobe. Advanced emphysema. Biapical pleural-parenchymal scarring. Unchanged 1.3 cm subpleural nodule in the posterior left upper lobe. No pleural effusion or pneumothorax.  Upper Abdomen: No acute abnormality. Musculoskeletal: No acute fracture or destructive osseous lesion. IMPRESSION: 1. Findings are consistent with esophagitis, possibly radiation induced given history. No pneumomediastinum. 2. Fluid and debris filling the esophagus reaching the thoracic inlet. 3. Patchy ground-glass opacities with bronchial wall thickening in the right middle and lower lobe, likely infectious or inflammatory and possibly due to aspiration given fluid column in the esophagus. 4. Slightly decreased size of the spiculated mass in the right lower lobe. 5. Unchanged 1.3 cm subpleural nodule in the posterior left upper lobe. Aortic Atherosclerosis (ICD10-I70.0) and Emphysema (ICD10-J43.9). Electronically Signed   By: Minerva Fester M.D.   On: 09/09/2023 02:39   DG Chest Portable 1 View Result Date: 09/08/2023 CLINICAL DATA:  Lung cancer, vomiting, sternal pain EXAM: PORTABLE CHEST 1 VIEW COMPARISON:  CT chest 09/12/2023 and radiographs 05/17/2023 FINDINGS: Right chest wall Port-A-Cath tip in the low SVC. Stable cardiomediastinal silhouette. Aortic atherosclerotic calcification. Grossly similar size of the irregular airspace opacity in the right lower lung compared to CT 08/15/2023. Question 1.4 cm nodule in the left midlung. Hyperinflation and chronic bronchitic change. Bibasilar atelectasis/scarring. No pleural effusion or pneumothorax. No displaced rib fractures. IMPRESSION: 1. Grossly similar size of the irregular airspace opacity in the right lower lung compared to CT 08/15/2023. 2. Question 1.4 cm nodule in the left midlung. This could be projectional artifact or due to a subpleural nodule seen on 08/15/2023. Continued CT follow-up is recommended. 3. Advanced emphysema. Electronically Signed   By: Minerva Fester M.D.   On: 09/08/2023 22:24    Impression:   Dysphagia/Odynophagia.  Recent chemotherapy/radiation therapy for lung cancer.  Last chemotherapy and radiation 4-6 weeks ago. Failure  to thrive.  Marked malnutrition.  ~ 30 lb weight loss in  past few weeks. Protein-calorie malnutrition. Lung cancer with chemotherapy and radiation therapy.  Last about 4-6 weeks ago.  Plan:   IV PPI and sucralfate oral suspension. Hydrate with IV fluids. Clear liquids as tolerated. Patient is markedly cachectic and at a much higher-than-average risk for endoscopic evaluation.  As such, family and I have opted to try supportive treatments for her dysphagia/odynophagia over the weekend.  If there is no improvement, we would likely need to pursue endoscopy to help gauge nature and severity of her swallowing problem, despite the higher-than-average risks involved. Difficult case. [facility] will follow.   LOS: 0 days   Huzaifa Viney M  09/09/2023, 10:48 AM  Cell [phone] If no answer or after 5 PM call [phone]

## 2023-09-10 DIAGNOSIS — Z1152 Encounter for screening for COVID-19: Secondary | ICD-10-CM | POA: Diagnosis not present

## 2023-09-10 DIAGNOSIS — Z681 Body mass index (BMI) 19 or less, adult: Secondary | ICD-10-CM | POA: Diagnosis not present

## 2023-09-10 DIAGNOSIS — E785 Hyperlipidemia, unspecified: Secondary | ICD-10-CM | POA: Diagnosis present

## 2023-09-10 DIAGNOSIS — R627 Adult failure to thrive: Secondary | ICD-10-CM | POA: Diagnosis present

## 2023-09-10 DIAGNOSIS — I10 Essential (primary) hypertension: Secondary | ICD-10-CM | POA: Diagnosis present

## 2023-09-10 DIAGNOSIS — E11649 Type 2 diabetes mellitus with hypoglycemia without coma: Secondary | ICD-10-CM | POA: Diagnosis present

## 2023-09-10 DIAGNOSIS — K208 Other esophagitis without bleeding: Secondary | ICD-10-CM

## 2023-09-10 DIAGNOSIS — T66XXXA Radiation sickness, unspecified, initial encounter: Secondary | ICD-10-CM

## 2023-09-10 DIAGNOSIS — E860 Dehydration: Secondary | ICD-10-CM | POA: Diagnosis present

## 2023-09-10 DIAGNOSIS — C3431 Malignant neoplasm of lower lobe, right bronchus or lung: Secondary | ICD-10-CM | POA: Diagnosis present

## 2023-09-10 DIAGNOSIS — Z635 Disruption of family by separation and divorce: Secondary | ICD-10-CM | POA: Diagnosis not present

## 2023-09-10 DIAGNOSIS — B3789 Other sites of candidiasis: Secondary | ICD-10-CM | POA: Diagnosis present

## 2023-09-10 DIAGNOSIS — R1310 Dysphagia, unspecified: Secondary | ICD-10-CM | POA: Diagnosis present

## 2023-09-10 DIAGNOSIS — E43 Unspecified severe protein-calorie malnutrition: Secondary | ICD-10-CM | POA: Diagnosis present

## 2023-09-10 DIAGNOSIS — B370 Candidal stomatitis: Secondary | ICD-10-CM | POA: Diagnosis present

## 2023-09-10 DIAGNOSIS — E876 Hypokalemia: Secondary | ICD-10-CM | POA: Diagnosis not present

## 2023-09-10 DIAGNOSIS — J4489 Other specified chronic obstructive pulmonary disease: Secondary | ICD-10-CM | POA: Diagnosis present

## 2023-09-10 DIAGNOSIS — J690 Pneumonitis due to inhalation of food and vomit: Secondary | ICD-10-CM | POA: Diagnosis present

## 2023-09-10 DIAGNOSIS — Y842 Radiological procedure and radiotherapy as the cause of abnormal reaction of the patient, or of later complication, without mention of misadventure at the time of the procedure: Secondary | ICD-10-CM | POA: Diagnosis present

## 2023-09-10 DIAGNOSIS — E870 Hyperosmolality and hypernatremia: Secondary | ICD-10-CM | POA: Diagnosis present

## 2023-09-10 DIAGNOSIS — K222 Esophageal obstruction: Secondary | ICD-10-CM | POA: Diagnosis present

## 2023-09-10 DIAGNOSIS — R64 Cachexia: Secondary | ICD-10-CM | POA: Diagnosis present

## 2023-09-10 DIAGNOSIS — F17210 Nicotine dependence, cigarettes, uncomplicated: Secondary | ICD-10-CM | POA: Diagnosis present

## 2023-09-10 DIAGNOSIS — Z90710 Acquired absence of both cervix and uterus: Secondary | ICD-10-CM | POA: Diagnosis not present

## 2023-09-10 DIAGNOSIS — Z9221 Personal history of antineoplastic chemotherapy: Secondary | ICD-10-CM | POA: Diagnosis not present

## 2023-09-10 DIAGNOSIS — Z923 Personal history of irradiation: Secondary | ICD-10-CM | POA: Diagnosis not present

## 2023-09-10 LAB — BASIC METABOLIC PANEL
Anion gap: 6 (ref 5–15)
BUN: 7 mg/dL — ABNORMAL LOW (ref 8–23)
CO2: 28 mmol/L (ref 22–32)
Calcium: 7.7 mg/dL — ABNORMAL LOW (ref 8.9–10.3)
Chloride: 106 mmol/L (ref 98–111)
Creatinine, Ser: 0.52 mg/dL (ref 0.44–1.00)
GFR, Estimated: >60 mL/min (ref >60)
Glucose, Bld: 85 mg/dL (ref 70–99)
Potassium: 3.3 mmol/L — ABNORMAL LOW (ref 3.5–5.1)
Sodium: 140 mmol/L (ref 135–145)

## 2023-09-10 LAB — GLUCOSE, CAPILLARY
Glucose-Capillary: 103 mg/dL — ABNORMAL HIGH (ref 70–99)
Glucose-Capillary: 110 mg/dL — ABNORMAL HIGH (ref 70–99)
Glucose-Capillary: 132 mg/dL — ABNORMAL HIGH (ref 70–99)
Glucose-Capillary: 87 mg/dL (ref 70–99)
Glucose-Capillary: 88 mg/dL (ref 70–99)
Glucose-Capillary: 93 mg/dL (ref 70–99)

## 2023-09-10 LAB — CBC WITH DIFFERENTIAL/PLATELET
Abs Immature Granulocytes: 0.02 10*3/uL (ref 0.00–0.07)
Basophils Absolute: 0 10*3/uL (ref 0.0–0.1)
Basophils Relative: 0 %
Eosinophils Absolute: 0 10*3/uL (ref 0.0–0.5)
Eosinophils Relative: 0 %
HCT: 32.4 % — ABNORMAL LOW (ref 36.0–46.0)
Hemoglobin: 10 g/dL — ABNORMAL LOW (ref 12.0–15.0)
Immature Granulocytes: 1 %
Lymphocytes Relative: 25 %
Lymphs Abs: 0.8 10*3/uL (ref 0.7–4.0)
MCH: 29.4 pg (ref 26.0–34.0)
MCHC: 30.9 g/dL (ref 30.0–36.0)
MCV: 95.3 fL (ref 80.0–100.0)
Monocytes Absolute: 0.5 10*3/uL (ref 0.1–1.0)
Monocytes Relative: 15 %
Neutro Abs: 2 10*3/uL (ref 1.7–7.7)
Neutrophils Relative %: 59 %
Platelets: 264 10*3/uL (ref 150–400)
RBC: 3.4 MIL/uL — ABNORMAL LOW (ref 3.87–5.11)
RDW: 17.3 % — ABNORMAL HIGH (ref 11.5–15.5)
WBC: 3.4 10*3/uL — ABNORMAL LOW (ref 4.0–10.5)
nRBC: 0 % (ref 0.0–0.2)

## 2023-09-10 LAB — RESPIRATORY PANEL BY PCR

## 2023-09-10 LAB — PHOSPHORUS: Phosphorus: 2 mg/dL — ABNORMAL LOW (ref 2.5–4.6)

## 2023-09-10 LAB — MAGNESIUM: Magnesium: 1.7 mg/dL (ref 1.7–2.4)

## 2023-09-10 MED ORDER — AMPICILLIN-SULBACTAM SODIUM 3 (2-1) G IJ SOLR
3.0000 g | Freq: Four times a day (QID) | INTRAMUSCULAR | Status: DC
  Administered 2023-09-10 – 2023-09-15 (×16): 3 g via INTRAVENOUS
  Filled 2023-09-10 (×21): qty 8

## 2023-09-10 MED ORDER — BOOST / RESOURCE BREEZE PO LIQD CUSTOM
1.0000 | Freq: Three times a day (TID) | ORAL | Status: DC
  Administered 2023-09-10 – 2023-09-11 (×4): 1 via ORAL

## 2023-09-10 MED ORDER — SODIUM CHLORIDE 0.9% FLUSH
10.0000 mL | Freq: Two times a day (BID) | INTRAVENOUS | Status: DC
Start: 2023-09-10 — End: 2023-09-15
  Administered 2023-09-10 – 2023-09-12 (×6): 10 mL
  Administered 2023-09-13: 40 mL
  Administered 2023-09-13 – 2023-09-15 (×4): 10 mL

## 2023-09-10 MED ORDER — SODIUM CHLORIDE 0.9% FLUSH: 10.0000 mL | INTRAVENOUS | Status: DC | PRN

## 2023-09-10 MED ORDER — CHLORHEXIDINE GLUCONATE CLOTH 2 % EX PADS
6.0000 | MEDICATED_PAD | Freq: Every day | CUTANEOUS | Status: DC
  Administered 2023-09-10 – 2023-09-15 (×6): 6 via TOPICAL

## 2023-09-10 NOTE — Progress Notes (Signed)
 [facility] Progress Note  Kristy Salazar 83 y.o. 1941/04/01   Subjective: Ongoing dysphagia where food/liquids hang up and has occurred for the past 3 weeks. God daughter in room and daughter on speaker phone.  Objective: Vital signs: Vitals:   09/10/23 0426 09/10/23 1157  BP: 101/64 111/78  Pulse: 78 92  Resp: 17 18  Temp: 98 F (36.7 C) 98.5 F (36.9 C)  SpO2: 95% 97%    Physical Exam: Gen: cachetic, elderly, lethargic, no acute distress  HEENT: anicteric sclera CV: RRR Chest: CTA B Abd: soft, nontender, nondistended, +BS Ext: no edema  Lab Results: Recent Labs    09/09/23 0040 09/09/23 0657 09/10/23 0505  NA  --  140 140  K  --  2.9* 3.3*  CL  --  104 106  CO2  --  24 28  GLUCOSE  --  74 85  BUN  --  11 7*  CREATININE  --  0.49 0.52  CALCIUM  --  8.4* 7.7*  MG 2.0  --  1.7  PHOS  --   --  2.0*   Recent Labs    09/08/23 1954  AST 36  ALT 26  ALKPHOS 99  BILITOT 0.8  PROT 7.7  ALBUMIN 2.7*   Recent Labs    09/08/23 1954 09/10/23 0505  WBC 4.1 3.4*  NEUTROABS 2.3 2.0  HGB 12.5 10.0*  HCT 40.3 32.4*  MCV 95.7 95.3  PLT 332 264      Assessment/Plan: Odynophagia and dysphagia in the setting of chemoradiation for lung cancer. Suspect radiation-induced stricture or esophagitis that is not improving thus far on PPI, sucralfate, and Fluconazole. Continue clear liquid diet. If no improvement tomorrow, then will do EGD 09/12/23 to further evaluate. Continue supportive care.   [name] 09/10/2023, 3:02 PM  Questions please call [phone] ID: Kristy Salazar, female   DOB: 09-09-1940, 83 y.o.   MRN: [id number]

## 2023-09-10 NOTE — Plan of Care (Signed)

## 2023-09-10 NOTE — Progress Notes (Signed)
 Initial Nutrition Assessment  DOCUMENTATION CODES:   Underweight  INTERVENTION:  - CLD per SLP.  - Boost Breeze po TID, each supplement provides 250 kcal and 9 grams of protein - Recommend Multivitamin with minerals daily when able.   - If unable to advance past clear liquids or patient unable to eat enough PO, can consider a Cortrak for temporary EN if within patient's GOC.  - Patient reported she would like to avoid a feeding tube if possible. Monitor intake and diet tolerance.    NUTRITION DIAGNOSIS:   Inadequate oral intake related to dysphagia as evidenced by energy intake < or equal to 50% for > or equal to 5 days.  GOAL:   Patient will meet greater than or equal to 90% of their needs  MONITOR:   PO intake, Supplement acceptance, Diet advancement, Weight trends  REASON FOR ASSESSMENT:   Consult Assessment of nutrition requirement/status  ASSESSMENT:   83 y.o. female with PMH significant of stage IIIb non-small cell lung cancer/squamous cell carcinoma diagnosed in September 2024 s/p concurrent chemoradiation and currently on immunotherapy, COPD/asthma, type 2 diabetes, HTN, HLD who presented with complaints of nausea, vomiting, and difficulty swallowing.  RD working remotely. Called patient via bedside telephone to obtain nutrition history.   Patient reports a UBW of 140# prior to chemo and steady weight loss since starting treatment.  Per EMR, only weight history in the pat year begins in July. Patient was weighed at 124# in August and has since continued to lose to lowest weight of 93# taken on 2/10. This is a 31# or 25% weight loss in less than 6 months, which is significant. Weight appears stable since 2/10.   Patient reports that her dysphagia began ~3 weeks ago. She shares it has progressively gotten worse.  Prior to these issues, she reports eating 1 big meal a day with several snacks. Also drinking 1 Carnation Instant Breakfast at breakfast and 2 Ensures daily  at home.  Her appetite has remained great during all of this, she is now just limited by dysphagia.  Patient had SLP eval yesterday and per SLP, "no overt indication of pharyngeal dysphagia patient's symptoms all are consistent with suspected esophageal deficits ". Recommend a clear liquid diet for now. Of note, SLP note states that daughter reported the patient was willing to receiving a feeding tube if needed.   Patient is documented to have had 100% of her breakfast of clears this AM. She reports having had chicken broth, jello, ginger ale and was able to keep it all down. She is agreeable to try Boost Breeze to support intake.  Patient reports a feeding tube has been mentioned previously but she would like to avoid this if possible. Will need to monitor intake closely as suspect patient is severely malnourished given weight loss and BMI of 12.97. However, unable to complete NFPE due to working remotely.   Medications reviewed and include: Carafate  Labs reviewed:  K+ 3.3 Phosphorus 2.0 HA1C 5.3   NUTRITION - FOCUSED PHYSICAL EXAM:  RD working remotely  Diet Order:   Diet Order             Diet clear liquid Room service appropriate? Yes; Fluid consistency: Thin  Diet effective now                   EDUCATION NEEDS:  Education needs have been addressed  Skin:  Skin Assessment: Reviewed RN Assessment  Last BM:  2/27  Height:  Ht Readings  from Last 1 Encounters:  09/08/23 5\' 11"  (1.803 m)   Weight:  Wt Readings from Last 1 Encounters:  09/08/23 42.2 kg    BMI:  Body mass index is 12.97 kg/m.  Estimated Nutritional Needs:  Kcal:  1500-1700 kcals Protein:  70-85 grams Fluid:  >/= 1.5L    Shelle Iron RD, LDN Contact via Secure Chat.

## 2023-09-10 NOTE — Progress Notes (Signed)
 PROGRESS NOTE  Kristy Salazar:[id number] DOB: March 31, 1941 DOA: 09/08/2023 PCP: [name], MD  HPI/Recap of past 24 hours: Kristy Salazar is a 83 y.o. female with medical history significant of stage IIIb non-small cell lung cancer/squamous cell carcinoma diagnosed in September 2024 status post concurrent chemoradiation and currently on immunotherapy, COPD/asthma, type 2 diabetes, hypertension, hyperlipidemia presented to ED with complaints of nausea, vomiting, and difficulty swallowing for several weeks with significant weight loss.  Unable to keep anything down even liquids. VS fairly stable. Labs showed potassium 2.9, UA with negative nitrite, trace leukocytes, and microscopy showing 6-10 WBCs and no bacteria.  Troponin negative x 2.  CT chest with contrast showing findings consistent with esophagitis, possibly radiation-induced given history, also showed patchy groundglass opacities with bronchial wall thickening in the right middle and lower lobe suspicious for possible aspiration pneumonia. TR[facility] called to admit.     Today, patient continues to be having difficulty swallowing clear liquid diet, continues to spit up/cough up with every sip.  Denies any other new complaints.   Assessment/Plan: Principal Problem:   Radiation esophagitis Active Problems:   COPD (chronic obstructive pulmonary disease) (HCC)   Primary squamous cell carcinoma of bronchus of right lower lobe (HCC)   Dysphagia   Aspiration pneumonia (HCC)   Hypokalemia   QT prolongation   Hypernatremia   Severe dysphagia/odynophagia secondary to possible radiation esophagitis/strictures CT chest with contrast showing findings consistent with esophagitis, possibly radiation-induced given history GI on board, due to poor candidacy for endoscopic evaluation, will attempt medical management and if symptoms persist, plan for EGD on 09/12/2023 SLP consulted, no further recs likely esophageal  Continue IVF, sucralfate,  Protonix and antiemetics Clear liquid diet for now, aspiration precautions   Possible aspiration pneumonia Currently afebrile, with no leukocytosis CT showing patchy groundglass opacities with bronchial wall thickening in the right middle and lower lobe suspicious for possible aspiration pneumonia Covid neg, resp viral panel neg Continue IV Unasyn Aspiration precautions   Hypokalemia QT prolongation Continue to monitor potassium level and replace as needed Avoid QT prolonging drugs if possible  Oropharyngeal candidiasis Noted Candida in oral cavity Nystatin swish and swallow, IV fluconazole   Stage IIIb non-small cell lung cancer/squamous cell carcinoma Diagnosed in September 2024 status post concurrent chemoradiation and currently on immunotherapy Outpatient oncology follow-up.   COPD/asthma Stable, no wheezing.  Continue home inhalers.   Type 2 diabetes with hypoglycemia Likely 2/2 poor oral intake  A1c 5.3 SSI, Accu-Cheks, hypoglycemic protocol   Hypertension Currently normotensive.  Not on antihypertensives at home.  Malnutrition Likely 2/2 poor oral intake with underlying malignancy Dietitian consulted  Goals of care discussion Patient with very poor prognosis Will need palliative/hospice consultation during this admission pending on clinical course     Estimated body mass index is 12.97 kg/m as calculated from the following:   Height as of this encounter: 5\' 11"  (1.803 m).   Weight as of this encounter: 42.2 kg.     Code Status: Full  Family Communication: Discussed with daughter at bedside  Disposition Plan: Status is: Inpatient The patient will require care spanning > 2 midnights and should be moved to inpatient because: Level of care      Consultants: GI  Procedures: None  Antimicrobials: Unasyn  DVT prophylaxis: Lovenox   Objective: Vitals:   09/10/23 0012 09/10/23 0013 09/10/23 0426 09/10/23 1157  BP: 91/60 97/64 101/64 111/78   Pulse: 99 94 78 92  Resp: 17  17 18   Temp:  98.1 F (36.7 C)  98 F (36.7 C) 98.5 F (36.9 C)  TempSrc:    Oral  SpO2: 96%  95% 97%  Weight:      Height:        Intake/Output Summary (Last 24 hours) at 09/10/2023 1750 Last data filed at 09/10/2023 0900 Gross per 24 hour  Intake 2053.83 ml  Output --  Net 2053.83 ml   Filed Weights   09/08/23 1927  Weight: 42.2 kg    Exam: General: NAD, frail, very cachectic Cardiovascular: S1, S2 present Respiratory: CTAB Abdomen: Soft, nontender, nondistended, bowel sounds present Musculoskeletal: No bilateral pedal edema noted Skin: Normal Psychiatry: Normal mood     Data Reviewed: CBC: Recent Labs  Lab 09/08/23 1954 09/10/23 0505  WBC 4.1 3.4*  NEUTROABS 2.3 2.0  HGB 12.5 10.0*  HCT 40.3 32.4*  MCV 95.7 95.3  PLT 332 264   Basic Metabolic Panel: Recent Labs  Lab 09/08/23 1954 09/09/23 0040 09/09/23 0657 09/10/23 0505  NA 147*  --  140 140  K 2.9*  --  2.9* 3.3*  CL 111  --  104 106  CO2 23  --  24 28  GLUCOSE 106*  --  74 85  BUN 19  --  11 7*  CREATININE 0.50  --  0.49 0.52  CALCIUM 9.0  --  8.4* 7.7*  MG  --  2.0  --  1.7  PHOS  --   --   --  2.0*   GFR: Estimated Creatinine Clearance: 37.4 mL/min (by C-G formula based on SCr of 0.52 mg/dL). Liver Function Tests: Recent Labs  Lab 09/08/23 1954  AST 36  ALT 26  ALKPHOS 99  BILITOT 0.8  PROT 7.7  ALBUMIN 2.7*   Recent Labs  Lab 09/08/23 1954  LIPASE 21   No results for input(s): "AMMONIA" in the last 168 hours. Coagulation Profile: No results for input(s): "INR", "PROTIME" in the last 168 hours. Cardiac Enzymes: No results for input(s): "CKTOTAL", "CKMB", "CKMBINDEX", "TROPONINI" in the last 168 hours. BNP (last 3 results) No results for input(s): "PROBNP" in the last 8760 hours. HbA1C: Recent Labs    09/09/23 0657  HGBA1C 5.3   CBG: Recent Labs  Lab 09/10/23 0011 09/10/23 0427 09/10/23 0743 09/10/23 1154 09/10/23 1547  GLUCAP 93  87 110* 103* 132*   Lipid Profile: No results for input(s): "CHOL", "HDL", "LDLCALC", "TRIG", "CHOLHDL", "LDLDIRECT" in the last 72 hours. Thyroid Function Tests: No results for input(s): "TSH", "T4TOTAL", "FREET4", "T3FREE", "THYROIDAB" in the last 72 hours. Anemia Panel: No results for input(s): "VITAMINB12", "FOLATE", "FERRITIN", "TIBC", "IRON", "RETICCTPCT" in the last 72 hours. Urine analysis:    Component Value Date/Time   COLORURINE YELLOW 09/08/2023 1954   APPEARANCEUR HAZY (A) 09/08/2023 1954   LABSPEC 1.022 09/08/2023 1954   PHURINE 6.0 09/08/2023 1954   GLUCOSEU >=500 (A) 09/08/2023 1954   HGBUR NEGATIVE 09/08/2023 1954   BILIRUBINUR NEGATIVE 09/08/2023 1954   KETONESUR 20 (A) 09/08/2023 1954   PROTEINUR NEGATIVE 09/08/2023 1954   UROBILINOGEN 0.2 05/10/2011 0930   NITRITE NEGATIVE 09/08/2023 1954   LEUKOCYTESUR TRACE (A) 09/08/2023 1954   Sepsis Labs: @LABRCNTIP (procalcitonin:4,lacticidven:4)  ) Recent Results (from the past 240 hours)  SARS Coronavirus 2 by RT PCR (hospital order, performed in [facility] hospital lab) *cepheid single result test* Nasopharyngeal Swab     Status: None   Collection Time: 09/09/23  6:57 AM   Specimen: Nasopharyngeal Swab; Nasal Swab  Result Value Ref Range Status  SARS Coronavirus 2 by RT PCR NEGATIVE NEGATIVE Final    Comment: (NOTE) SARS-CoV-2 target nucleic acids are NOT DETECTED.  The SARS-CoV-2 RNA is generally detectable in upper and lower respiratory specimens during the acute phase of infection. The lowest concentration of SARS-CoV-2 viral copies this assay can detect is 250 copies / mL. A negative result does not preclude SARS-CoV-2 infection and should not be used as the sole basis for treatment or other patient management decisions.  A negative result may occur with improper specimen collection / handling, submission of specimen other than nasopharyngeal swab, presence of viral mutation(s) within the areas targeted  by this assay, and inadequate number of viral copies (<250 copies / mL). A negative result must be combined with clinical observations, patient history, and epidemiological information.  Fact Sheet for Patients:   RoadLapTop.co.za  Fact Sheet for Healthcare Providers: http://kim-miller.com/  This test is not yet approved or  cleared by the [place] FDA and has been authorized for detection and/or diagnosis of SARS-CoV-2 by FDA under an Emergency Use Authorization (EUA).  This EUA will remain in effect (meaning this test can be used) for the duration of the COVID-19 declaration under Section 564(b)(1) of the Act, 21 U.S.C. section 360bbb-3(b)(1), unless the authorization is terminated or revoked sooner.  Performed at [facility], [address] [place], [place] [address]   Respiratory (~20 pathogens) panel by PCR     Status: None   Collection Time: 09/09/23  6:57 AM   Specimen: Nasopharyngeal Swab; Respiratory  Result Value Ref Range Status   Adenovirus NOT DETECTED NOT DETECTED Final   Coronavirus 229E NOT DETECTED NOT DETECTED Final    Comment: (NOTE) The Coronavirus on the Respiratory Panel, DOES NOT test for the novel  Coronavirus (2019 nCoV)    Coronavirus HKU1 NOT DETECTED NOT DETECTED Final   Coronavirus NL63 NOT DETECTED NOT DETECTED Final   Coronavirus OC43 NOT DETECTED NOT DETECTED Final   Metapneumovirus NOT DETECTED NOT DETECTED Final   Rhinovirus / Enterovirus NOT DETECTED NOT DETECTED Final   Influenza A NOT DETECTED NOT DETECTED Final   Influenza B NOT DETECTED NOT DETECTED Final   Parainfluenza Virus 1 NOT DETECTED NOT DETECTED Final   Parainfluenza Virus 2 NOT DETECTED NOT DETECTED Final   Parainfluenza Virus 3 NOT DETECTED NOT DETECTED Final   Parainfluenza Virus 4 NOT DETECTED NOT DETECTED Final   Respiratory Syncytial Virus NOT DETECTED NOT DETECTED Final   Bordetella pertussis NOT  DETECTED NOT DETECTED Final   Bordetella Parapertussis NOT DETECTED NOT DETECTED Final   Chlamydophila pneumoniae NOT DETECTED NOT DETECTED Final   Mycoplasma pneumoniae NOT DETECTED NOT DETECTED Final    Comment: Performed at [facility], [address], [place], [place] [address]  Culture, blood (Routine X 2) w Reflex to ID Panel     Status: None (Preliminary result)   Collection Time: 09/09/23  7:48 AM   Specimen: BLOOD  Result Value Ref Range Status   Specimen Description   Final    BLOOD SITE NOT SPECIFIED Performed at [facility], [address], [place], [place] [address]    Special Requests   Final    BOTTLES DRAWN AEROBIC AND ANAEROBIC Blood Culture adequate volume Performed at [facility], [address], [place], [place] [address]    Culture  Setup Time   Final    GRAM POSITIVE RODS ANAEROBIC BOTTLE ONLY CRITICAL RESULT CALLED TO, READ BACK BY AND VERIFIED  WITH: Jacklynn Bue Damaris Hippo [id number] @ 2308 FH    Culture   Final    GRAM POSITIVE RODS IDENTIFICATION TO FOLLOW Performed at [facility], [address] [place], [place] [address]    Report Status PENDING  Incomplete  Culture, blood (Routine X 2) w Reflex to ID Panel     Status: None (Preliminary result)   Collection Time: 09/09/23  3:40 PM   Specimen: BLOOD RIGHT ARM  Result Value Ref Range Status   Specimen Description   Final    BLOOD RIGHT ARM Performed at [facility], [address] [place], [place] [address]    Special Requests   Final    BOTTLES DRAWN AEROBIC AND ANAEROBIC Blood Culture results may not be optimal due to an inadequate volume of blood received in culture bottles Performed at [facility], [address] [place], [place] [address]    Culture   Final    NO GROWTH < 24 HOURS Performed at [facility], [address] [place], [place] [address]    Report Status PENDING  Incomplete      Studies: No  results found.   Scheduled Meds:  Chlorhexidine Gluconate Cloth  6 each Topical Daily   enoxaparin (LOVENOX) injection  30 mg Subcutaneous Q24H   feeding supplement  1 Container Oral TID BM   insulin aspart  0-6 Units Subcutaneous Q4H   nystatin  5 mL Oral QID   pantoprazole (PROTONIX) IV  40 mg Intravenous Q12H   sodium chloride flush  10-40 mL Intracatheter Q12H   sucralfate  1 g Oral TID WC & HS   umeclidinium bromide  1 puff Inhalation Daily    Continuous Infusions:  ampicillin-sulbactam (UNASYN) IV 3 g (09/10/23 1626)   fluconazole (DIFLUCAN) IV 200 mg (09/09/23 2328)     LOS: 0 days     Briant Cedar, MD [facility]  If 7PM-7AM, please contact night-coverage [email or web address] 09/10/2023, 5:50 PM

## 2023-09-11 DIAGNOSIS — K208 Other esophagitis without bleeding: Secondary | ICD-10-CM | POA: Diagnosis not present

## 2023-09-11 DIAGNOSIS — T66XXXA Radiation sickness, unspecified, initial encounter: Secondary | ICD-10-CM | POA: Diagnosis not present

## 2023-09-11 LAB — GLUCOSE, CAPILLARY
Glucose-Capillary: 103 mg/dL — ABNORMAL HIGH (ref 70–99)
Glucose-Capillary: 107 mg/dL — ABNORMAL HIGH (ref 70–99)
Glucose-Capillary: 126 mg/dL — ABNORMAL HIGH (ref 70–99)
Glucose-Capillary: 133 mg/dL — ABNORMAL HIGH (ref 70–99)
Glucose-Capillary: 62 mg/dL — ABNORMAL LOW (ref 70–99)
Glucose-Capillary: 75 mg/dL (ref 70–99)
Glucose-Capillary: 84 mg/dL (ref 70–99)
Glucose-Capillary: 86 mg/dL (ref 70–99)

## 2023-09-11 LAB — CBC WITH DIFFERENTIAL/PLATELET
Abs Immature Granulocytes: 0.03 10*3/uL (ref 0.00–0.07)
Basophils Absolute: 0 10*3/uL (ref 0.0–0.1)
Basophils Relative: 0 %
Eosinophils Absolute: 0 10*3/uL (ref 0.0–0.5)
Eosinophils Relative: 1 %
HCT: 33.4 % — ABNORMAL LOW (ref 36.0–46.0)
Hemoglobin: 10.3 g/dL — ABNORMAL LOW (ref 12.0–15.0)
Immature Granulocytes: 1 %
Lymphocytes Relative: 19 %
Lymphs Abs: 0.8 10*3/uL (ref 0.7–4.0)
MCH: 30.1 pg (ref 26.0–34.0)
MCHC: 30.8 g/dL (ref 30.0–36.0)
MCV: 97.7 fL (ref 80.0–100.0)
Monocytes Absolute: 0.6 10*3/uL (ref 0.1–1.0)
Monocytes Relative: 13 %
Neutro Abs: 3 10*3/uL (ref 1.7–7.7)
Neutrophils Relative %: 66 %
Platelets: 260 10*3/uL (ref 150–400)
RBC: 3.42 MIL/uL — ABNORMAL LOW (ref 3.87–5.11)
RDW: 17 % — ABNORMAL HIGH (ref 11.5–15.5)
WBC: 4.5 10*3/uL (ref 4.0–10.5)
nRBC: 0 % (ref 0.0–0.2)

## 2023-09-11 LAB — BASIC METABOLIC PANEL
Anion gap: 10 (ref 5–15)
BUN: 6 mg/dL — ABNORMAL LOW (ref 8–23)
CO2: 25 mmol/L (ref 22–32)
Calcium: 7.9 mg/dL — ABNORMAL LOW (ref 8.9–10.3)
Chloride: 108 mmol/L (ref 98–111)
Creatinine, Ser: 0.56 mg/dL (ref 0.44–1.00)
GFR, Estimated: >60 mL/min (ref >60)
Glucose, Bld: 85 mg/dL (ref 70–99)
Potassium: 2.5 mmol/L — CL (ref 3.5–5.1)
Sodium: 143 mmol/L (ref 135–145)

## 2023-09-11 LAB — MAGNESIUM: Magnesium: 1.7 mg/dL (ref 1.7–2.4)

## 2023-09-11 MED ORDER — ACETAMINOPHEN 10 MG/ML IV SOLN
1000.0000 mg | Freq: Once | INTRAVENOUS | Status: AC
  Administered 2023-09-12: 1000 mg via INTRAVENOUS
  Filled 2023-09-11: qty 100

## 2023-09-11 MED ORDER — DEXTROSE 50 % IV SOLN
12.5000 g | Freq: Once | INTRAVENOUS | Status: AC
  Administered 2023-09-11: 12.5 g via INTRAVENOUS
  Filled 2023-09-11: qty 50

## 2023-09-11 MED ORDER — POTASSIUM CHLORIDE 10 MEQ/100ML IV SOLN
10.0000 meq | INTRAVENOUS | Status: AC
  Administered 2023-09-11 (×5): 10 meq via INTRAVENOUS
  Filled 2023-09-11 (×5): qty 100

## 2023-09-11 MED ORDER — POTASSIUM CHLORIDE 10 MEQ/100ML IV SOLN
10.0000 meq | Freq: Once | INTRAVENOUS | Status: AC
  Administered 2023-09-11: 10 meq via INTRAVENOUS
  Filled 2023-09-11: qty 100

## 2023-09-11 MED ORDER — DEXTROSE-SODIUM CHLORIDE 5-0.45 % IV SOLN
INTRAVENOUS | Status: AC
Start: 2023-09-11 — End: 2023-09-12

## 2023-09-11 MED ORDER — ACETAMINOPHEN 10 MG/ML IV SOLN
1000.0000 mg | Freq: Once | INTRAVENOUS | Status: AC
  Administered 2023-09-11: 1000 mg via INTRAVENOUS
  Filled 2023-09-11: qty 100

## 2023-09-11 MED ORDER — ALUM & MAG HYDROXIDE-SIMETH 200-200-20 MG/5ML PO SUSP: 30.0000 mL | Freq: Four times a day (QID) | ORAL | Status: DC | PRN

## 2023-09-11 NOTE — H&P (View-Only) (Signed)
 [facility] Progress Note  Kristy Salazar 83 y.o. Feb 28, 1941   Subjective: No improvement in dysphagia and odynophagia. Family and nurse in room.  Objective: Vital signs: Vitals:   09/11/23 0931 09/11/23 1147  BP:  123/77  Pulse:  85  Resp:  20  Temp:  98 F (36.7 C)  SpO2: 99% 98%    Physical Exam: Gen: cachetic, lethargic, elderly, no acute distress, pleasant HEENT: anicteric sclera CV: RRR Chest: CTA B Abd: soft, nontender, nondistended, +BS Ext: no edema  Lab Results: Recent Labs    09/10/23 0505 09/11/23 0241 09/11/23 0939  NA 140 143  --   K 3.3* 2.5*  --   CL 106 108  --   CO2 28 25  --   GLUCOSE 85 85  --   BUN 7* 6*  --   CREATININE 0.52 0.56  --   CALCIUM 7.7* 7.9*  --   MG 1.7  --  1.7  PHOS 2.0*  --   --    Recent Labs    09/08/23 1954  AST 36  ALT 26  ALKPHOS 99  BILITOT 0.8  PROT 7.7  ALBUMIN 2.7*   Recent Labs    09/10/23 0505 09/11/23 0241  WBC 3.4* 4.5  NEUTROABS 2.0 3.0  HGB 10.0* 10.3*  HCT 32.4* 33.4*  MCV 95.3 97.7  PLT 264 260      Assessment/Plan: Dysphagia and odynophagia in the setting of chemoradiation for lung cancer without improvement with Sucralfate, Fluconazole, and PPI. EGD tomorrow morning to further evaluate. Clear liquid diet. NPO p MN. Supportive care.   [name] 09/11/2023, 12:54 PM  Questions please call [phone] ID: Kristy Salazar, female   DOB: 1940-08-31, 83 y.o.   MRN: [id number]

## 2023-09-11 NOTE — Progress Notes (Signed)
 [facility] Progress Note  Kristy Salazar 83 y.o. Feb 28, 1941   Subjective: No improvement in dysphagia and odynophagia. Family and nurse in room.  Objective: Vital signs: Vitals:   09/11/23 0931 09/11/23 1147  BP:  123/77  Pulse:  85  Resp:  20  Temp:  98 F (36.7 C)  SpO2: 99% 98%    Physical Exam: Gen: cachetic, lethargic, elderly, no acute distress, pleasant HEENT: anicteric sclera CV: RRR Chest: CTA B Abd: soft, nontender, nondistended, +BS Ext: no edema  Lab Results: Recent Labs    09/10/23 0505 09/11/23 0241 09/11/23 0939  NA 140 143  --   K 3.3* 2.5*  --   CL 106 108  --   CO2 28 25  --   GLUCOSE 85 85  --   BUN 7* 6*  --   CREATININE 0.52 0.56  --   CALCIUM 7.7* 7.9*  --   MG 1.7  --  1.7  PHOS 2.0*  --   --    Recent Labs    09/08/23 1954  AST 36  ALT 26  ALKPHOS 99  BILITOT 0.8  PROT 7.7  ALBUMIN 2.7*   Recent Labs    09/10/23 0505 09/11/23 0241  WBC 3.4* 4.5  NEUTROABS 2.0 3.0  HGB 10.0* 10.3*  HCT 32.4* 33.4*  MCV 95.3 97.7  PLT 264 260      Assessment/Plan: Dysphagia and odynophagia in the setting of chemoradiation for lung cancer without improvement with Sucralfate, Fluconazole, and PPI. EGD tomorrow morning to further evaluate. Clear liquid diet. NPO p MN. Supportive care.   [name] 09/11/2023, 12:54 PM  Questions please call [phone] ID: Kristy Salazar, female   DOB: 1940-08-31, 83 y.o.   MRN: [id number]

## 2023-09-11 NOTE — Plan of Care (Signed)

## 2023-09-11 NOTE — Progress Notes (Addendum)
 Date and time results received: 09/11/23  0345 (use smartphrase ".now" to insert current time)  Test: CBC Critical Value: K+ 2.5  Name of Provider Notified: Johann Capers  Orders Received? Or Actions Taken?: Orders Received - See Orders for details

## 2023-09-11 NOTE — Plan of Care (Signed)
  Problem: Coping: Goal: Ability to adjust to condition or change in health will improve Outcome: Progressing   Problem: Health Behavior/Discharge Planning: Goal: Ability to identify and utilize available resources and services will improve Outcome: Progressing   Problem: Metabolic: Goal: Ability to maintain appropriate glucose levels will improve Outcome: Progressing   Problem: Tissue Perfusion: Goal: Adequacy of tissue perfusion will improve Outcome: Progressing   Problem: Education: Goal: Knowledge of General Education information will improve Description: Including pain rating scale, medication(s)/side effects and non-pharmacologic comfort measures Outcome: Progressing   Problem: Health Behavior/Discharge Planning: Goal: Ability to manage health-related needs will improve Outcome: Progressing   Problem: Clinical Measurements: Goal: Will remain free from infection Outcome: Progressing Goal: Respiratory complications will improve Outcome: Progressing   Problem: Coping: Goal: Level of anxiety will decrease Outcome: Progressing   Problem: Elimination: Goal: Will not experience complications related to urinary retention Outcome: Progressing   Problem: Education: Goal: Ability to describe self-care measures that may prevent or decrease complications (Diabetes Survival Skills Education) will improve Outcome: Not Progressing Goal: Individualized Educational Video(s) Outcome: Not Progressing   Problem: Fluid Volume: Goal: Ability to maintain a balanced intake and output will improve Outcome: Not Progressing   Problem: Health Behavior/Discharge Planning: Goal: Ability to manage health-related needs will improve Outcome: Not Progressing   Problem: Nutritional: Goal: Maintenance of adequate nutrition will improve Outcome: Not Progressing Goal: Progress toward achieving an optimal weight will improve Outcome: Not Progressing   Problem: Skin Integrity: Goal: Risk for  impaired skin integrity will decrease Outcome: Not Progressing   Problem: Clinical Measurements: Goal: Ability to maintain clinical measurements within normal limits will improve Outcome: Not Progressing Goal: Diagnostic test results will improve Outcome: Not Progressing Goal: Cardiovascular complication will be avoided Outcome: Not Progressing   Problem: Activity: Goal: Risk for activity intolerance will decrease Outcome: Not Progressing   Problem: Nutrition: Goal: Adequate nutrition will be maintained Outcome: Not Progressing   Problem: Elimination: Goal: Will not experience complications related to bowel motility Outcome: Not Progressing   Problem: Pain Managment: Goal: General experience of comfort will improve and/or be controlled Outcome: Not Progressing   Problem: Safety: Goal: Ability to remain free from injury will improve Outcome: Not Progressing   Problem: Skin Integrity: Goal: Risk for impaired skin integrity will decrease Outcome: Not Progressing

## 2023-09-11 NOTE — Progress Notes (Signed)
 PROGRESS NOTE  Kristy Salazar:[id number] DOB: [birth year] DOA: 09/08/2023 PCP: [name], MD  HPI/Recap of past 24 hours: Kristy Salazar is a 83 y.o. female with medical history significant of stage IIIb non-small cell lung cancer/squamous cell carcinoma diagnosed in September 2024 status post concurrent chemoradiation and currently on immunotherapy, COPD/asthma, type 2 diabetes, hypertension, hyperlipidemia presented to ED with complaints of nausea, vomiting, and difficulty swallowing for several weeks with significant weight loss.  Unable to keep anything down even liquids. VS fairly stable. Labs showed potassium 2.9, UA with negative nitrite, trace leukocytes, and microscopy showing 6-10 WBCs and no bacteria.  Troponin negative x 2.  CT chest with contrast showing findings consistent with esophagitis, possibly radiation-induced given history, also showed patchy groundglass opacities with bronchial wall thickening in the right middle and lower lobe suspicious for possible aspiration pneumonia. TRH called to admit.     Today, patient still having difficulty swallowing, unable to keep much down.   Assessment/Plan: Principal Problem:   Radiation esophagitis Active Problems:   COPD (chronic obstructive pulmonary disease) (HCC)   Primary squamous cell carcinoma of bronchus of right lower lobe (HCC)   Dysphagia   Aspiration pneumonia (HCC)   Hypokalemia   QT prolongation   Hypernatremia   Severe dysphagia/odynophagia secondary to possible radiation esophagitis/strictures CT chest with contrast showing findings consistent with esophagitis, possibly radiation-induced given history GI on board, due to poor candidacy for endoscopic evaluation, will attempt medical management and if symptoms persist, plan for EGD on 09/12/2023 SLP consulted, no further recs likely esophageal  Continue IVF, sucralfate, Protonix and antiemetics Clear liquid diet for now, aspiration precautions   Possible  aspiration pneumonia Currently afebrile, with no leukocytosis CT showing patchy groundglass opacities with bronchial wall thickening in the right middle and lower lobe suspicious for possible aspiration pneumonia Covid neg, resp viral panel neg Continue IV Unasyn Aspiration precautions   Hypokalemia QT prolongation Continue to monitor potassium level and replace as needed Avoid QT prolonging drugs if possible  Oropharyngeal candidiasis Noted Candida in oral cavity Nystatin swish and swallow, IV fluconazole   Stage IIIb non-small cell lung cancer/squamous cell carcinoma Diagnosed in September 2024 status post concurrent chemoradiation and currently on immunotherapy Outpatient oncology follow-up.   COPD/asthma Stable, no wheezing.  Continue home inhalers.   Type 2 diabetes with hypoglycemia Likely 2/2 poor oral intake  A1c 5.3 SSI, Accu-Cheks, hypoglycemic protocol   Hypertension Currently normotensive.  Not on antihypertensives at home.  Malnutrition Likely 2/2 poor oral intake with underlying malignancy Dietitian consulted  Goals of care discussion Patient with very poor prognosis Will need palliative/hospice consultation during this admission pending on clinical course     Estimated body mass index is 12.97 kg/m as calculated from the following:   Height as of this encounter: 5\' 11"  (1.803 m).   Weight as of this encounter: 42.2 kg.     Code Status: Full  Family Communication: Discussed with grand daughter at bedside  Disposition Plan: Status is: Inpatient The patient will require care spanning > 2 midnights and should be moved to inpatient because: Level of care      Consultants: GI  Procedures: None  Antimicrobials: Unasyn  DVT prophylaxis: Lovenox   Objective: Vitals:   09/10/23 1954 09/11/23 0429 09/11/23 0931 09/11/23 1147  BP: 103/70 106/76  123/77  Pulse: 92 90  85  Resp: 18 18  20   Temp: 98.5 F (36.9 C) 98 F (36.7 C)  98 F  (36.7 C)  TempSrc: Oral Oral  Oral  SpO2: 95% 96% 99% 98%  Weight:      Height:        Intake/Output Summary (Last 24 hours) at 09/11/2023 1646 Last data filed at 09/11/2023 0600 Gross per 24 hour  Intake 717.41 ml  Output --  Net 717.41 ml   Filed Weights   09/08/23 1927  Weight: 42.2 kg    Exam: General: NAD, frail, very cachectic Cardiovascular: S1, S2 present Respiratory: CTAB Abdomen: Soft, nontender, nondistended, bowel sounds present Musculoskeletal: No bilateral pedal edema noted Skin: Normal Psychiatry: Normal mood     Data Reviewed: CBC: Recent Labs  Lab 09/08/23 1954 09/10/23 0505 09/11/23 0241  WBC 4.1 3.4* 4.5  NEUTROABS 2.3 2.0 3.0  HGB 12.5 10.0* 10.3*  HCT 40.3 32.4* 33.4*  MCV 95.7 95.3 97.7  PLT 332 264 260   Basic Metabolic Panel: Recent Labs  Lab 09/08/23 1954 09/09/23 0040 09/09/23 0657 09/10/23 0505 09/11/23 0241 09/11/23 0939  NA 147*  --  140 140 143  --   K 2.9*  --  2.9* 3.3* 2.5*  --   CL 111  --  104 106 108  --   CO2 23  --  24 28 25   --   GLUCOSE 106*  --  74 85 85  --   BUN 19  --  11 7* 6*  --   CREATININE 0.50  --  0.49 0.52 0.56  --   CALCIUM 9.0  --  8.4* 7.7* 7.9*  --   MG  --  2.0  --  1.7  --  1.7  PHOS  --   --   --  2.0*  --   --    GFR: Estimated Creatinine Clearance: 37.4 mL/min (by C-G formula based on SCr of 0.56 mg/dL). Liver Function Tests: Recent Labs  Lab 09/08/23 1954  AST 36  ALT 26  ALKPHOS 99  BILITOT 0.8  PROT 7.7  ALBUMIN 2.7*   Recent Labs  Lab 09/08/23 1954  LIPASE 21   No results for input(s): "AMMONIA" in the last 168 hours. Coagulation Profile: No results for input(s): "INR", "PROTIME" in the last 168 hours. Cardiac Enzymes: No results for input(s): "CKTOTAL", "CKMB", "CKMBINDEX", "TROPONINI" in the last 168 hours. BNP (last 3 results) No results for input(s): "PROBNP" in the last 8760 hours. HbA1C: Recent Labs    09/09/23 0657  HGBA1C 5.3   CBG: Recent Labs  Lab  09/11/23 0430 09/11/23 0738 09/11/23 1145 09/11/23 1249 09/11/23 1604  GLUCAP 84 75 62* 133* 107*   Lipid Profile: No results for input(s): "CHOL", "HDL", "LDLCALC", "TRIG", "CHOLHDL", "LDLDIRECT" in the last 72 hours. Thyroid Function Tests: No results for input(s): "TSH", "T4TOTAL", "FREET4", "T3FREE", "THYROIDAB" in the last 72 hours. Anemia Panel: No results for input(s): "VITAMINB12", "FOLATE", "FERRITIN", "TIBC", "IRON", "RETICCTPCT" in the last 72 hours. Urine analysis:    Component Value Date/Time   COLORURINE YELLOW 09/08/2023 1954   APPEARANCEUR HAZY (A) 09/08/2023 1954   LABSPEC 1.022 09/08/2023 1954   PHURINE 6.0 09/08/2023 1954   GLUCOSEU >=500 (A) 09/08/2023 1954   HGBUR NEGATIVE 09/08/2023 1954   BILIRUBINUR NEGATIVE 09/08/2023 1954   KETONESUR 20 (A) 09/08/2023 1954   PROTEINUR NEGATIVE 09/08/2023 1954   UROBILINOGEN 0.2 05/10/2011 0930   NITRITE NEGATIVE 09/08/2023 1954   LEUKOCYTESUR TRACE (A) 09/08/2023 1954   Sepsis Labs: @LABRCNTIP (procalcitonin:4,lacticidven:4)  ) Recent Results (from the past 240 hours)  SARS Coronavirus 2 by RT PCR (hospital  order, performed in [facility] hospital lab) *cepheid single result test* Nasopharyngeal Swab     Status: None   Collection Time: 09/09/23  6:57 AM   Specimen: Nasopharyngeal Swab; Nasal Swab  Result Value Ref Range Status   SARS Coronavirus 2 by RT PCR NEGATIVE NEGATIVE Final    Comment: (NOTE) SARS-CoV-2 target nucleic acids are NOT DETECTED.  The SARS-CoV-2 RNA is generally detectable in upper and lower respiratory specimens during the acute phase of infection. The lowest concentration of SARS-CoV-2 viral copies this assay can detect is 250 copies / mL. A negative result does not preclude SARS-CoV-2 infection and should not be used as the sole basis for treatment or other patient management decisions.  A negative result may occur with improper specimen collection / handling, submission of specimen  other than nasopharyngeal swab, presence of viral mutation(s) within the areas targeted by this assay, and inadequate number of viral copies (<250 copies / mL). A negative result must be combined with clinical observations, patient history, and epidemiological information.  Fact Sheet for Patients:   RoadLapTop.co.za  Fact Sheet for Healthcare Providers: http://kim-miller.com/  This test is not yet approved or  cleared by the [place] FDA and has been authorized for detection and/or diagnosis of SARS-CoV-2 by FDA under an Emergency Use Authorization (EUA).  This EUA will remain in effect (meaning this test can be used) for the duration of the COVID-19 declaration under Section 564(b)(1) of the Act, 21 U.S.C. section 360bbb-3(b)(1), unless the authorization is terminated or revoked sooner.  Performed at [facility], [address], [place], [place] [address]   Respiratory (~20 pathogens) panel by PCR     Status: None   Collection Time: 09/09/23  6:57 AM   Specimen: Nasopharyngeal Swab; Respiratory  Result Value Ref Range Status   Adenovirus NOT DETECTED NOT DETECTED Final   Coronavirus 229E NOT DETECTED NOT DETECTED Final    Comment: (NOTE) The Coronavirus on the Respiratory Panel, DOES NOT test for the novel  Coronavirus (2019 nCoV)    Coronavirus HKU1 NOT DETECTED NOT DETECTED Final   Coronavirus NL63 NOT DETECTED NOT DETECTED Final   Coronavirus OC43 NOT DETECTED NOT DETECTED Final   Metapneumovirus NOT DETECTED NOT DETECTED Final   Rhinovirus / Enterovirus NOT DETECTED NOT DETECTED Final   Influenza A NOT DETECTED NOT DETECTED Final   Influenza B NOT DETECTED NOT DETECTED Final   Parainfluenza Virus 1 NOT DETECTED NOT DETECTED Final   Parainfluenza Virus 2 NOT DETECTED NOT DETECTED Final   Parainfluenza Virus 3 NOT DETECTED NOT DETECTED Final   Parainfluenza Virus 4 NOT DETECTED NOT DETECTED Final    Respiratory Syncytial Virus NOT DETECTED NOT DETECTED Final   Bordetella pertussis NOT DETECTED NOT DETECTED Final   Bordetella Parapertussis NOT DETECTED NOT DETECTED Final   Chlamydophila pneumoniae NOT DETECTED NOT DETECTED Final   Mycoplasma pneumoniae NOT DETECTED NOT DETECTED Final    Comment: Performed at [facility], [address] [place], [place] [address]  Culture, blood (Routine X 2) w Reflex to ID Panel     Status: Abnormal (Preliminary result)   Collection Time: 09/09/23  7:48 AM   Specimen: BLOOD  Result Value Ref Range Status   Specimen Description   Final    BLOOD SITE NOT SPECIFIED Performed at [facility], [address], [place], [place] [address]    Special Requests   Final    BOTTLES DRAWN AEROBIC AND ANAEROBIC Blood Culture adequate  volume Performed at [facility], [address], [place], [place] [address]    Culture  Setup Time   Final    GRAM POSITIVE RODS ANAEROBIC BOTTLE ONLY CRITICAL RESULT CALLED TO, READ BACK BY AND VERIFIED WITH: PHARMD Damaris Hippo [id number] @ 2308 FH    Culture (A)  Final    BACILLUS SPECIES NOT CONSISTENT WITH BACILLUS ANTHRACIS Standardized susceptibility testing for this organism is not available. Performed at [facility], [address] [place], [place] [address]    Report Status PENDING  Incomplete  Culture, blood (Routine X 2) w Reflex to ID Panel     Status: None (Preliminary result)   Collection Time: 09/09/23  3:40 PM   Specimen: BLOOD RIGHT ARM  Result Value Ref Range Status   Specimen Description   Final    BLOOD RIGHT ARM Performed at [facility], [address], [place], [place] [address]    Special Requests   Final    BOTTLES DRAWN AEROBIC AND ANAEROBIC Blood Culture results may not be optimal due to an inadequate volume of blood received in culture bottles Performed at [facility], [address], [place], [place] [address]     Culture   Final    NO GROWTH 2 DAYS Performed at [facility], [address], [place], [place] [address]    Report Status PENDING  Incomplete      Studies: No results found.   Scheduled Meds:  Chlorhexidine Gluconate Cloth  6 each Topical Daily   enoxaparin (LOVENOX) injection  30 mg Subcutaneous Q24H   feeding supplement  1 Container Oral TID BM   insulin aspart  0-6 Units Subcutaneous Q4H   nystatin  5 mL Oral QID   pantoprazole (PROTONIX) IV  40 mg Intravenous Q12H   sodium chloride flush  10-40 mL Intracatheter Q12H   sucralfate  1 g Oral TID WC & HS   umeclidinium bromide  1 puff Inhalation Daily    Continuous Infusions:  ampicillin-sulbactam (UNASYN) IV 3 g (09/11/23 1604)   dextrose 5 % and 0.45 % NaCl 100 mL/hr at 09/11/23 1229   fluconazole (DIFLUCAN) IV 200 mg (09/10/23 2235)     LOS: 1 day     Briant Cedar, MD [facility]  If 7PM-7AM, please contact night-coverage [email or web address] 09/11/2023, 4:46 PM

## 2023-09-12 ENCOUNTER — Telehealth: Payer: Self-pay

## 2023-09-12 DIAGNOSIS — K208 Other esophagitis without bleeding: Secondary | ICD-10-CM | POA: Diagnosis not present

## 2023-09-12 DIAGNOSIS — T66XXXA Radiation sickness, unspecified, initial encounter: Secondary | ICD-10-CM | POA: Diagnosis not present

## 2023-09-12 LAB — CBC WITH DIFFERENTIAL/PLATELET
Abs Immature Granulocytes: 0.04 10*3/uL (ref 0.00–0.07)
Basophils Absolute: 0 10*3/uL (ref 0.0–0.1)
Basophils Relative: 0 %
Eosinophils Absolute: 0.1 10*3/uL (ref 0.0–0.5)
Eosinophils Relative: 2 %
HCT: 32.6 % — ABNORMAL LOW (ref 36.0–46.0)
Hemoglobin: 9.9 g/dL — ABNORMAL LOW (ref 12.0–15.0)
Immature Granulocytes: 1 %
Lymphocytes Relative: 23 %
Lymphs Abs: 0.7 10*3/uL (ref 0.7–4.0)
MCH: 29.7 pg (ref 26.0–34.0)
MCHC: 30.4 g/dL (ref 30.0–36.0)
MCV: 97.9 fL (ref 80.0–100.0)
Monocytes Absolute: 0.4 10*3/uL (ref 0.1–1.0)
Monocytes Relative: 13 %
Neutro Abs: 1.8 10*3/uL (ref 1.7–7.7)
Neutrophils Relative %: 61 %
Platelets: 254 10*3/uL (ref 150–400)
RBC: 3.33 MIL/uL — ABNORMAL LOW (ref 3.87–5.11)
RDW: 16.7 % — ABNORMAL HIGH (ref 11.5–15.5)
WBC: 3 10*3/uL — ABNORMAL LOW (ref 4.0–10.5)
nRBC: 0 % (ref 0.0–0.2)

## 2023-09-12 LAB — GLUCOSE, CAPILLARY
Glucose-Capillary: 113 mg/dL — ABNORMAL HIGH (ref 70–99)
Glucose-Capillary: 118 mg/dL — ABNORMAL HIGH (ref 70–99)
Glucose-Capillary: 118 mg/dL — ABNORMAL HIGH (ref 70–99)
Glucose-Capillary: 78 mg/dL (ref 70–99)
Glucose-Capillary: 90 mg/dL (ref 70–99)
Glucose-Capillary: 93 mg/dL (ref 70–99)

## 2023-09-12 LAB — POTASSIUM: Potassium: 3.1 mmol/L — ABNORMAL LOW (ref 3.5–5.1)

## 2023-09-12 LAB — BASIC METABOLIC PANEL
Anion gap: 7 (ref 5–15)
BUN: 6 mg/dL — ABNORMAL LOW (ref 8–23)
CO2: 24 mmol/L (ref 22–32)
Calcium: 7.6 mg/dL — ABNORMAL LOW (ref 8.9–10.3)
Chloride: 107 mmol/L (ref 98–111)
Creatinine, Ser: 0.55 mg/dL (ref 0.44–1.00)
GFR, Estimated: >60 mL/min (ref >60)
Glucose, Bld: 117 mg/dL — ABNORMAL HIGH (ref 70–99)
Potassium: 2.8 mmol/L — ABNORMAL LOW (ref 3.5–5.1)
Sodium: 138 mmol/L (ref 135–145)

## 2023-09-12 MED ORDER — PROPOFOL 500 MG/50ML IV EMUL
INTRAVENOUS | Status: AC
  Filled 2023-09-12: qty 100

## 2023-09-12 MED ORDER — DEXTROSE-SODIUM CHLORIDE 5-0.45 % IV SOLN: INTRAVENOUS | Status: AC

## 2023-09-12 MED ORDER — POTASSIUM CHLORIDE 10 MEQ/100ML IV SOLN
10.0000 meq | INTRAVENOUS | Status: AC
  Administered 2023-09-12 (×6): 10 meq via INTRAVENOUS
  Filled 2023-09-12 (×4): qty 100

## 2023-09-12 MED ORDER — ACETAMINOPHEN 10 MG/ML IV SOLN
1000.0000 mg | Freq: Once | INTRAVENOUS | Status: AC
  Administered 2023-09-12: 1000 mg via INTRAVENOUS
  Filled 2023-09-12: qty 100

## 2023-09-12 MED ORDER — POTASSIUM CHLORIDE 20 MEQ PO PACK
40.0000 meq | PACK | Freq: Once | ORAL | Status: AC
  Administered 2023-09-12: 40 meq via ORAL
  Filled 2023-09-12: qty 2

## 2023-09-12 NOTE — Progress Notes (Signed)
   09/12/23 0905  TOC Brief Assessment  Insurance and Status Reviewed  Patient has primary care physician Yes  Home environment has been reviewed Single family home  Prior level of function: Independent/modified indpendent  Prior/Current Home Services No current home services  Social Drivers of Health Review SDOH reviewed no interventions necessary  Readmission risk has been reviewed Yes  Transition of care needs transition of care needs identified, TOC will continue to follow

## 2023-09-12 NOTE — Telephone Encounter (Signed)
 Called to check on patient due to N/V and reduced appetite, but is able to keep down ice chips and sherbet. Currently located in at the [facility] ED, bed 1509-01. I left my extension [phone] in case patient needs anything.   Ruel Favors, LPN

## 2023-09-12 NOTE — Progress Notes (Signed)
 PROGRESS NOTE  WILLIS KUIPERS BMW:[id number] DOB: 03/24/1941 DOA: 09/08/2023 PCP: [name], MD  HPI/Recap of past 24 hours: Kristy Salazar is a 83 y.o. female with medical history significant of stage IIIb non-small cell lung cancer/squamous cell carcinoma diagnosed in September 2024 status post concurrent chemoradiation and currently on immunotherapy, COPD/asthma, type 2 diabetes, hypertension, hyperlipidemia presented to ED with complaints of nausea, vomiting, and difficulty swallowing for several weeks with significant weight loss.  Unable to keep anything down even liquids. VS fairly stable. Labs showed potassium 2.9, UA with negative nitrite, trace leukocytes, and microscopy showing 6-10 WBCs and no bacteria.  Troponin negative x 2.  CT chest with contrast showing findings consistent with esophagitis, possibly radiation-induced given history, also showed patchy groundglass opacities with bronchial wall thickening in the right middle and lower lobe suspicious for possible aspiration pneumonia. TRH called to admit.     Today, patient denied any new complaints.  Still with difficulty swallowing.  EGD postponed till 3/4.  Daughters and granddaughter at bedside   Assessment/Plan: Principal Problem:   Radiation esophagitis Active Problems:   COPD (chronic obstructive pulmonary disease) (HCC)   Primary squamous cell carcinoma of bronchus of right lower lobe (HCC)   Dysphagia   Aspiration pneumonia (HCC)   Hypokalemia   QT prolongation   Hypernatremia   Severe dysphagia/odynophagia secondary to possible radiation esophagitis/strictures CT chest with contrast showing findings consistent with esophagitis, possibly radiation-induced given history GI on board, due to poor candidacy for endoscopic evaluation, will attempt medical management and if symptoms persist, plan for EGD on 09/12/2023 SLP consulted, no further recs likely esophageal  Continue IVF, sucralfate, Protonix and  antiemetics Clear liquid diet for now, aspiration precautions   Possible aspiration pneumonia Currently afebrile, with no leukocytosis CT showing patchy groundglass opacities with bronchial wall thickening in the right middle and lower lobe suspicious for possible aspiration pneumonia Covid neg, resp viral panel neg Continue IV Unasyn Aspiration precautions   Hypokalemia QT prolongation Continue to monitor potassium level and replace as needed Avoid QT prolonging drugs if possible  Oropharyngeal candidiasis Noted Candida in oral cavity Nystatin swish and swallow, IV fluconazole   Stage IIIb non-small cell lung cancer/squamous cell carcinoma Diagnosed in September 2024 status post concurrent chemoradiation and currently on immunotherapy Outpatient oncology follow-up.   COPD/asthma Stable, no wheezing.  Continue home inhalers.   Type 2 diabetes with hypoglycemia Likely 2/2 poor oral intake  A1c 5.3 SSI, Accu-Cheks, hypoglycemic protocol   Hypertension Currently normotensive.  Not on antihypertensives at home.  Malnutrition Likely 2/2 poor oral intake with underlying malignancy Dietitian consulted  Goals of care discussion Patient with very poor prognosis Will need palliative/hospice consultation during this admission pending on clinical course     Estimated body mass index is 12.97 kg/m as calculated from the following:   Height as of this encounter: 5\' 11"  (1.803 m).   Weight as of this encounter: 42.2 kg.     Code Status: Full  Family Communication: Discussed with daughters/grand daughters at bedside  Disposition Plan: Status is: Inpatient The patient will require care spanning > 2 midnights and should be moved to inpatient because: Level of care      Consultants: GI  Procedures: None  Antimicrobials: Unasyn  DVT prophylaxis: Lovenox   Objective: Vitals:   09/11/23 2034 09/12/23 0445 09/12/23 0825 09/12/23 1132  BP: 106/75 111/69  129/89   Pulse: 92 79  72  Resp: 18 18    Temp: 98.2 F (  36.8 C) 98.2 F (36.8 C)  98.3 F (36.8 C)  TempSrc: Oral Oral    SpO2: 96% 97% 96% 100%  Weight:      Height:       No intake or output data in the 24 hours ending 09/12/23 1720  Filed Weights   09/08/23 1927  Weight: 42.2 kg    Exam: General: NAD, frail, very cachectic Cardiovascular: S1, S2 present Respiratory: CTAB Abdomen: Soft, nontender, nondistended, bowel sounds present Musculoskeletal: No bilateral pedal edema noted Skin: Normal Psychiatry: Normal mood     Data Reviewed: CBC: Recent Labs  Lab 09/08/23 1954 09/10/23 0505 09/11/23 0241 09/12/23 0558  WBC 4.1 3.4* 4.5 3.0*  NEUTROABS 2.3 2.0 3.0 1.8  HGB 12.5 10.0* 10.3* 9.9*  HCT 40.3 32.4* 33.4* 32.6*  MCV 95.7 95.3 97.7 97.9  PLT 332 264 260 254   Basic Metabolic Panel: Recent Labs  Lab 09/08/23 1954 09/09/23 0040 09/09/23 0657 09/10/23 0505 09/11/23 0241 09/11/23 0939 09/12/23 0558 09/12/23 1230  NA 147*  --  140 140 143  --  138  --   K 2.9*  --  2.9* 3.3* 2.5*  --  2.8* 3.1*  CL 111  --  104 106 108  --  107  --   CO2 23  --  24 28 25   --  24  --   GLUCOSE 106*  --  74 85 85  --  117*  --   BUN 19  --  11 7* 6*  --  6*  --   CREATININE 0.50  --  0.49 0.52 0.56  --  0.55  --   CALCIUM 9.0  --  8.4* 7.7* 7.9*  --  7.6*  --   MG  --  2.0  --  1.7  --  1.7  --   --   PHOS  --   --   --  2.0*  --   --   --   --    GFR: Estimated Creatinine Clearance: 37.4 mL/min (by C-G formula based on SCr of 0.55 mg/dL). Liver Function Tests: Recent Labs  Lab 09/08/23 1954  AST 36  ALT 26  ALKPHOS 99  BILITOT 0.8  PROT 7.7  ALBUMIN 2.7*   Recent Labs  Lab 09/08/23 1954  LIPASE 21   No results for input(s): "AMMONIA" in the last 168 hours. Coagulation Profile: No results for input(s): "INR", "PROTIME" in the last 168 hours. Cardiac Enzymes: No results for input(s): "CKTOTAL", "CKMB", "CKMBINDEX", "TROPONINI" in the last 168 hours. BNP  (last 3 results) No results for input(s): "PROBNP" in the last 8760 hours. HbA1C: No results for input(s): "HGBA1C" in the last 72 hours.  CBG: Recent Labs  Lab 09/11/23 2331 09/12/23 0442 09/12/23 0737 09/12/23 1129 09/12/23 1650  GLUCAP 103* 118* 113* 90 78   Lipid Profile: No results for input(s): "CHOL", "HDL", "LDLCALC", "TRIG", "CHOLHDL", "LDLDIRECT" in the last 72 hours. Thyroid Function Tests: No results for input(s): "TSH", "T4TOTAL", "FREET4", "T3FREE", "THYROIDAB" in the last 72 hours. Anemia Panel: No results for input(s): "VITAMINB12", "FOLATE", "FERRITIN", "TIBC", "IRON", "RETICCTPCT" in the last 72 hours. Urine analysis:    Component Value Date/Time   COLORURINE YELLOW 09/08/2023 1954   APPEARANCEUR HAZY (A) 09/08/2023 1954   LABSPEC 1.022 09/08/2023 1954   PHURINE 6.0 09/08/2023 1954   GLUCOSEU >=500 (A) 09/08/2023 1954   HGBUR NEGATIVE 09/08/2023 1954   BILIRUBINUR NEGATIVE 09/08/2023 1954   KETONESUR 20 (A) 09/08/2023 1954  PROTEINUR NEGATIVE 09/08/2023 1954   UROBILINOGEN 0.2 05/10/2011 0930   NITRITE NEGATIVE 09/08/2023 1954   LEUKOCYTESUR TRACE (A) 09/08/2023 1954   Sepsis Labs: @LABRCNTIP (procalcitonin:4,lacticidven:4)  ) Recent Results (from the past 240 hours)  SARS Coronavirus 2 by RT PCR (hospital order, performed in [facility] hospital lab) *cepheid single result test* Nasopharyngeal Swab     Status: None   Collection Time: 09/09/23  6:57 AM   Specimen: Nasopharyngeal Swab; Nasal Swab  Result Value Ref Range Status   SARS Coronavirus 2 by RT PCR NEGATIVE NEGATIVE Final    Comment: (NOTE) SARS-CoV-2 target nucleic acids are NOT DETECTED.  The SARS-CoV-2 RNA is generally detectable in upper and lower respiratory specimens during the acute phase of infection. The lowest concentration of SARS-CoV-2 viral copies this assay can detect is 250 copies / mL. A negative result does not preclude SARS-CoV-2 infection and should not be used as the  sole basis for treatment or other patient management decisions.  A negative result may occur with improper specimen collection / handling, submission of specimen other than nasopharyngeal swab, presence of viral mutation(s) within the areas targeted by this assay, and inadequate number of viral copies (<250 copies / mL). A negative result must be combined with clinical observations, patient history, and epidemiological information.  Fact Sheet for Patients:   RoadLapTop.co.za  Fact Sheet for Healthcare Providers: http://kim-miller.com/  This test is not yet approved or  cleared by the [place] FDA and has been authorized for detection and/or diagnosis of SARS-CoV-2 by FDA under an Emergency Use Authorization (EUA).  This EUA will remain in effect (meaning this test can be used) for the duration of the COVID-19 declaration under Section 564(b)(1) of the Act, 21 U.S.C. section 360bbb-3(b)(1), unless the authorization is terminated or revoked sooner.  Performed at [facility], [address] [place], [place] [address]   Respiratory (~20 pathogens) panel by PCR     Status: None   Collection Time: 09/09/23  6:57 AM   Specimen: Nasopharyngeal Swab; Respiratory  Result Value Ref Range Status   Adenovirus NOT DETECTED NOT DETECTED Final   Coronavirus 229E NOT DETECTED NOT DETECTED Final    Comment: (NOTE) The Coronavirus on the Respiratory Panel, DOES NOT test for the novel  Coronavirus (2019 nCoV)    Coronavirus HKU1 NOT DETECTED NOT DETECTED Final   Coronavirus NL63 NOT DETECTED NOT DETECTED Final   Coronavirus OC43 NOT DETECTED NOT DETECTED Final   Metapneumovirus NOT DETECTED NOT DETECTED Final   Rhinovirus / Enterovirus NOT DETECTED NOT DETECTED Final   Influenza A NOT DETECTED NOT DETECTED Final   Influenza B NOT DETECTED NOT DETECTED Final   Parainfluenza Virus 1 NOT DETECTED NOT DETECTED Final    Parainfluenza Virus 2 NOT DETECTED NOT DETECTED Final   Parainfluenza Virus 3 NOT DETECTED NOT DETECTED Final   Parainfluenza Virus 4 NOT DETECTED NOT DETECTED Final   Respiratory Syncytial Virus NOT DETECTED NOT DETECTED Final   Bordetella pertussis NOT DETECTED NOT DETECTED Final   Bordetella Parapertussis NOT DETECTED NOT DETECTED Final   Chlamydophila pneumoniae NOT DETECTED NOT DETECTED Final   Mycoplasma pneumoniae NOT DETECTED NOT DETECTED Final    Comment: Performed at [facility], [address], [place], [place] [address]  Culture, blood (Routine X 2) w Reflex to ID Panel     Status: Abnormal (Preliminary result)   Collection Time: 09/09/23  7:48 AM   Specimen: BLOOD  Result Value Ref Range Status  Specimen Description   Final    BLOOD SITE NOT SPECIFIED Performed at [facility], [address], [place], [place] [address]    Special Requests   Final    BOTTLES DRAWN AEROBIC AND ANAEROBIC Blood Culture adequate volume Performed at [facility], [address], [place], [place] [address]    Culture  Setup Time   Final    GRAM POSITIVE RODS ANAEROBIC BOTTLE ONLY CRITICAL RESULT CALLED TO, READ BACK BY AND VERIFIED WITH: PHARMD Damaris Hippo [id number] @ 2308 FH    Culture (A)  Final    BACILLUS SPECIES NOT CONSISTENT WITH BACILLUS ANTHRACIS Standardized susceptibility testing for this organism is not available. Performed at [facility], [address], [place], [place] [address]    Report Status PENDING  Incomplete  Culture, blood (Routine X 2) w Reflex to ID Panel     Status: None (Preliminary result)   Collection Time: 09/09/23  3:40 PM   Specimen: BLOOD RIGHT ARM  Result Value Ref Range Status   Specimen Description   Final    BLOOD RIGHT ARM Performed at [facility], [address], [place], [place] [address]    Special Requests   Final    BOTTLES DRAWN AEROBIC AND ANAEROBIC Blood Culture results may not be  optimal due to an inadequate volume of blood received in culture bottles Performed at [facility], [address] [place], [place] [address]    Culture   Final    NO GROWTH 3 DAYS Performed at [facility], [address] [place], [place] [address]    Report Status PENDING  Incomplete      Studies: No results found.   Scheduled Meds:  Chlorhexidine Gluconate Cloth  6 each Topical Daily   enoxaparin (LOVENOX) injection  30 mg Subcutaneous Q24H   feeding supplement  1 Container Oral TID BM   insulin aspart  0-6 Units Subcutaneous Q4H   nystatin  5 mL Oral QID   pantoprazole (PROTONIX) IV  40 mg Intravenous Q12H   sodium chloride flush  10-40 mL Intracatheter Q12H   sucralfate  1 g Oral TID WC & HS   umeclidinium bromide  1 puff Inhalation Daily    Continuous Infusions:  ampicillin-sulbactam (UNASYN) IV 3 g (09/12/23 1616)   fluconazole (DIFLUCAN) IV 200 mg (09/11/23 2236)     LOS: 2 days     Briant Cedar, MD [facility]  If 7PM-7AM, please contact night-coverage [email or web address] 09/12/2023, 5:20 PM

## 2023-09-12 NOTE — Progress Notes (Signed)
 PT Cancellation Note  Patient Details Name: Kristy Salazar MRN: [id number] DOB: Jun 15, 1941   Cancelled Treatment:    Reason Eval/Treat Not Completed: Other (comment) Patient scheduled for EGD today. Will check back another time. Blanchard Kelch PT Acute Rehabilitation Services Office [phone]     Rada Hay 09/12/2023, 8:51 AM

## 2023-09-12 NOTE — Progress Notes (Signed)
 OT Cancellation Note  Patient Details Name: Kristy Salazar MRN: [id number] DOB: Jul 09, 1941   Cancelled Treatment:    Reason Eval/Treat Not Completed: Medical issues which prohibited therapy Patient scheduled for EGD today. Will check back another time.  Rosalio Loud, MS Acute Rehabilitation Department Office# [phone]  09/12/2023, 1:52 PM

## 2023-09-12 NOTE — Anesthesia Preprocedure Evaluation (Signed)
 Anesthesia Evaluation  Patient identified by MRN, date of birth, ID band Patient awake    Reviewed: Allergy & Precautions, NPO status , Patient's Chart, lab work & pertinent test results  History of Anesthesia Complications Negative for: history of anesthetic complications  Airway Mallampati: III  TM Distance: >3 FB Neck ROM: Full    Dental no notable dental hx. (+) Poor Dentition, Missing, Dental Advisory Given   Pulmonary asthma , pneumonia, COPD,  COPD inhaler, Current Smoker and Patient abstained from smoking.  Lung cancer    Pulmonary exam normal breath sounds clear to auscultation       Cardiovascular hypertension, Pt. on medications Normal cardiovascular exam Rhythm:Regular Rate:Normal     Neuro/Psych negative neurological ROS  negative psych ROS   GI/Hepatic negative GI ROS, Neg liver ROS,,, Dysphagia    Endo/Other  diabetes     Renal/GU negative Renal ROS     Musculoskeletal  (+) Arthritis ,    Abdominal   Peds  Hematology negative hematology ROS (+) Blood dyscrasia, anemia   Anesthesia Other Findings   Reproductive/Obstetrics                             Anesthesia Physical Anesthesia Plan  ASA: 3  Anesthesia Plan: MAC   Post-op Pain Management: Minimal or no pain anticipated   Induction: Intravenous  PONV Risk Score and Plan: 2 and Propofol infusion and Treatment may vary due to age or medical condition  Airway Management Planned: Nasal Cannula and Natural Airway  Additional Equipment: None  Intra-op Plan:   Post-operative Plan: Extubation in OR  Informed Consent: I have reviewed the patients History and Physical, chart, labs and discussed the procedure including the risks, benefits and alternatives for the proposed anesthesia with the patient or authorized representative who has indicated his/her understanding and acceptance.     Dental advisory  given  Plan Discussed with: CRNA and Anesthesiologist  Anesthesia Plan Comments:        Anesthesia Quick Evaluation

## 2023-09-12 NOTE — Plan of Care (Signed)
  Problem: Education: Goal: Ability to describe self-care measures that may prevent or decrease complications (Diabetes Survival Skills Education) will improve Outcome: Progressing Goal: Individualized Educational Video(s) Outcome: Progressing   Problem: Coping: Goal: Ability to adjust to condition or change in health will improve Outcome: Progressing   Problem: Health Behavior/Discharge Planning: Goal: Ability to identify and utilize available resources and services will improve Outcome: Progressing Goal: Ability to manage health-related needs will improve Outcome: Progressing   Problem: Skin Integrity: Goal: Risk for impaired skin integrity will decrease Outcome: Progressing   Problem: Tissue Perfusion: Goal: Adequacy of tissue perfusion will improve Outcome: Progressing   Problem: Education: Goal: Knowledge of General Education information will improve Description: Including pain rating scale, medication(s)/side effects and non-pharmacologic comfort measures Outcome: Progressing   Problem: Health Behavior/Discharge Planning: Goal: Ability to manage health-related needs will improve Outcome: Progressing   Problem: Clinical Measurements: Goal: Ability to maintain clinical measurements within normal limits will improve Outcome: Progressing Goal: Will remain free from infection Outcome: Progressing Goal: Respiratory complications will improve Outcome: Progressing Goal: Cardiovascular complication will be avoided Outcome: Progressing   Problem: Coping: Goal: Level of anxiety will decrease Outcome: Progressing   Problem: Elimination: Goal: Will not experience complications related to bowel motility Outcome: Progressing Goal: Will not experience complications related to urinary retention Outcome: Progressing   Problem: Pain Managment: Goal: General experience of comfort will improve and/or be controlled Outcome: Progressing   Problem: Safety: Goal: Ability to  remain free from injury will improve Outcome: Progressing   Problem: Skin Integrity: Goal: Risk for impaired skin integrity will decrease Outcome: Progressing   Problem: Fluid Volume: Goal: Ability to maintain a balanced intake and output will improve Outcome: Not Progressing   Problem: Metabolic: Goal: Ability to maintain appropriate glucose levels will improve Outcome: Not Progressing   Problem: Nutritional: Goal: Maintenance of adequate nutrition will improve Outcome: Not Progressing Goal: Progress toward achieving an optimal weight will improve Outcome: Not Progressing   Problem: Clinical Measurements: Goal: Diagnostic test results will improve Outcome: Not Progressing   Problem: Activity: Goal: Risk for activity intolerance will decrease Outcome: Not Progressing   Problem: Nutrition: Goal: Adequate nutrition will be maintained Outcome: Not Progressing

## 2023-09-13 ENCOUNTER — Inpatient Hospital Stay (HOSPITAL_COMMUNITY): Payer: Self-pay | Admitting: Anesthesiology

## 2023-09-13 ENCOUNTER — Encounter (HOSPITAL_COMMUNITY)
Admission: EM | Disposition: A | Discharge status: Home / Self Care | Payer: Self-pay | Source: Home / Self Care | Attending: Internal Medicine

## 2023-09-13 ENCOUNTER — Telehealth: Payer: Self-pay

## 2023-09-13 DIAGNOSIS — T66XXXA Radiation sickness, unspecified, initial encounter: Secondary | ICD-10-CM | POA: Diagnosis not present

## 2023-09-13 DIAGNOSIS — K208 Other esophagitis without bleeding: Secondary | ICD-10-CM | POA: Diagnosis not present

## 2023-09-13 HISTORY — PX: ESOPHAGOGASTRODUODENOSCOPY (EGD) WITH PROPOFOL: SHX5813

## 2023-09-13 LAB — BASIC METABOLIC PANEL
Anion gap: 7 (ref 5–15)
BUN: <5 mg/dL — ABNORMAL LOW (ref 8–23)
CO2: 24 mmol/L (ref 22–32)
Calcium: 7.6 mg/dL — ABNORMAL LOW (ref 8.9–10.3)
Chloride: 104 mmol/L (ref 98–111)
Creatinine, Ser: 0.5 mg/dL (ref 0.44–1.00)
GFR, Estimated: >60 mL/min (ref >60)
Glucose, Bld: 109 mg/dL — ABNORMAL HIGH (ref 70–99)
Potassium: 3.4 mmol/L — ABNORMAL LOW (ref 3.5–5.1)
Sodium: 135 mmol/L (ref 135–145)

## 2023-09-13 LAB — MAGNESIUM: Magnesium: 1.6 mg/dL — ABNORMAL LOW (ref 1.7–2.4)

## 2023-09-13 LAB — GLUCOSE, CAPILLARY
Glucose-Capillary: 100 mg/dL — ABNORMAL HIGH (ref 70–99)
Glucose-Capillary: 106 mg/dL — ABNORMAL HIGH (ref 70–99)
Glucose-Capillary: 118 mg/dL — ABNORMAL HIGH (ref 70–99)
Glucose-Capillary: 144 mg/dL — ABNORMAL HIGH (ref 70–99)
Glucose-Capillary: 72 mg/dL (ref 70–99)

## 2023-09-13 SURGERY — ESOPHAGOGASTRODUODENOSCOPY (EGD) WITH PROPOFOL
Anesthesia: Monitor Anesthesia Care

## 2023-09-13 MED ORDER — MAGNESIUM SULFATE 2 GM/50ML IV SOLN
2.0000 g | Freq: Once | INTRAVENOUS | Status: AC
  Administered 2023-09-13: 2 g via INTRAVENOUS
  Filled 2023-09-13: qty 50

## 2023-09-13 MED ORDER — POTASSIUM CHLORIDE 20 MEQ PO PACK: 40.0000 meq | PACK | Freq: Once | ORAL | Status: DC

## 2023-09-13 MED ORDER — POTASSIUM CHLORIDE 10 MEQ/100ML IV SOLN
10.0000 meq | INTRAVENOUS | Status: AC
  Administered 2023-09-13 (×4): 10 meq via INTRAVENOUS
  Filled 2023-09-13 (×4): qty 100

## 2023-09-13 MED ORDER — PHENYLEPHRINE 80 MCG/ML (10ML) SYRINGE FOR IV PUSH (FOR BLOOD PRESSURE SUPPORT)
PREFILLED_SYRINGE | INTRAVENOUS | Status: DC | PRN
  Administered 2023-09-13: 160 ug via INTRAVENOUS

## 2023-09-13 MED ORDER — SODIUM CHLORIDE 0.9 % IV SOLN
INTRAVENOUS | Status: DC | PRN
Start: 2023-09-13 — End: 2023-09-13

## 2023-09-13 MED ORDER — DEXTROSE-SODIUM CHLORIDE 5-0.45 % IV SOLN: INTRAVENOUS | Status: AC

## 2023-09-13 MED ORDER — PROPOFOL 500 MG/50ML IV EMUL
INTRAVENOUS | Status: AC
  Filled 2023-09-13: qty 50

## 2023-09-13 MED ORDER — PROPOFOL 10 MG/ML IV BOLUS
INTRAVENOUS | Status: DC | PRN
  Administered 2023-09-13: 30 mg via INTRAVENOUS
  Administered 2023-09-13: 100 mg via INTRAVENOUS

## 2023-09-13 MED ORDER — LIDOCAINE 2% (20 MG/ML) 5 ML SYRINGE
INTRAMUSCULAR | Status: DC | PRN
  Administered 2023-09-13: 20 mg via INTRAVENOUS
  Administered 2023-09-13: 40 mg via INTRAVENOUS

## 2023-09-13 SURGICAL SUPPLY — 14 items

## 2023-09-13 NOTE — Transfer of Care (Signed)
 Immediate Anesthesia Transfer of Care Note  Patient: Kristy Salazar  Procedure(s) Performed: ESOPHAGOGASTRODUODENOSCOPY (EGD) WITH PROPOFOL  Patient Location: Endoscopy Unit  Anesthesia Type:MAC  Level of Consciousness: awake, alert , oriented, and patient cooperative  Airway & Oxygen Therapy: Patient Spontanous Breathing  Post-op Assessment: Report given to RN and Post -op Vital signs reviewed and stable  Post vital signs: Reviewed and stable  Last Vitals:  Vitals Value Taken Time  BP    Temp    Pulse    Resp    SpO2      Last Pain:  Vitals:   09/13/23 1226  TempSrc: Temporal  PainSc: 0-No pain         Complications: No notable events documented.

## 2023-09-13 NOTE — Progress Notes (Signed)
 Pharmacy Antibiotic Note  Kristy Salazar is a 83 y.o. female admitted on 09/08/2023 with aspiration pneumonia. CT showing patchy groundglass opacities with bronchial wall thickening in the right middle and lower lobe suspicious for possible aspiration pneumonia. Pharmacy has been consulted for Unasyn dosing. Pt is also on fluconazole for oropharangeal candidiasis   Plan: Continue  Unasyn 3g IV q6hrs - messaged MD about LOT, wanted a total of 7 days. Stop date in for 3/6  Continue fluconazole 200 mg IV q24h   Height: 5\' 11"  (180.3 cm) Weight: 42.2 kg (93 lb) IBW/kg (Calculated) : 70.8  Temp (24hrs), Avg:98.5 F (36.9 C), Min:98.3 F (36.8 C), Max:98.8 F (37.1 C)  Recent Labs  Lab 09/08/23 1954 09/09/23 0657 09/10/23 0505 09/11/23 0241 09/12/23 0558 09/13/23 0400  WBC 4.1  --  3.4* 4.5 3.0*  --   CREATININE 0.50 0.49 0.52 0.56 0.55 0.50    Estimated Creatinine Clearance: 37.4 mL/min (by C-G formula based on SCr of 0.5 mg/dL).    Allergies  Allergen Reactions   Erythromycin Hives    Antimicrobials this admission: 2/28 Unasyn >>  2/28 fluconazole (Oropharangeal candidiasis )>>   Dose adjustments this admission: N/A  Microbiology results: 2/28 BCx: 1/4 GPR (Bacillus species)  2/28 RVP: negative     Adalberto Cole, PharmD, BCPS 09/13/2023 10:16 AM

## 2023-09-13 NOTE — Progress Notes (Signed)
 OT Cancellation Note  Patient Details Name: Kristy Salazar MRN: [id number] DOB: 01-09-1941   Cancelled Treatment:    Reason Eval/Treat Not Completed: Patient at procedure or test/ unavailable Patient is off floor for EGD. OT to continue to follow.  Rosalio Loud, MS Acute Rehabilitation Department Office# [phone]  09/13/2023, 12:27 PM

## 2023-09-13 NOTE — Plan of Care (Addendum)
 VSS. Patient c/o throat pain, given IV Tylenol x1. NPO since midnight for EGD. K 3.4 this morning. Last BM 3/3. D5 1/2NS infusing at 163ml/hr. No acute events overnight. Daughter remains at bedside.  Problem: Metabolic: Goal: Ability to maintain appropriate glucose levels will improve Outcome: Progressing   Problem: Nutritional: Goal: Maintenance of adequate nutrition will improve Outcome: Progressing   Problem: Education: Goal: Knowledge of General Education information will improve Description: Including pain rating scale, medication(s)/side effects and non-pharmacologic comfort measures Outcome: Progressing   Problem: Clinical Measurements: Goal: Ability to maintain clinical measurements within normal limits will improve Outcome: Progressing Goal: Will remain free from infection Outcome: Progressing   Problem: Activity: Goal: Risk for activity intolerance will decrease Outcome: Progressing   Problem: Nutrition: Goal: Adequate nutrition will be maintained Outcome: Progressing   Problem: Pain Managment: Goal: General experience of comfort will improve and/or be controlled Outcome: Progressing   Problem: Safety: Goal: Ability to remain free from injury will improve Outcome: Progressing   Problem: Skin Integrity: Goal: Risk for impaired skin integrity will decrease Outcome: Progressing

## 2023-09-13 NOTE — Progress Notes (Signed)
 PROGRESS NOTE  Kristy Salazar EXB:[id number] DOB: 1940/10/29 DOA: 09/08/2023 PCP: [name], MD  HPI/Recap of past 24 hours: Kristy Salazar is a 83 y.o. female with medical history significant of stage IIIb non-small cell lung cancer/squamous cell carcinoma diagnosed in September 2024 status post concurrent chemoradiation and currently on immunotherapy, COPD/asthma, type 2 diabetes, hypertension, hyperlipidemia presented to ED with complaints of nausea, vomiting, and difficulty swallowing for several weeks with significant weight loss.  Unable to keep anything down even liquids. VS fairly stable. Labs showed potassium 2.9, UA with negative nitrite, trace leukocytes, and microscopy showing 6-10 WBCs and no bacteria.  Troponin negative x 2.  CT chest with contrast showing findings consistent with esophagitis, possibly radiation-induced given history, also showed patchy groundglass opacities with bronchial wall thickening in the right middle and lower lobe suspicious for possible aspiration pneumonia. TRH called to admit.      Today, patient denied any new complaints, unable to tolerate liquid potassium.    Assessment/Plan: Principal Problem:   Radiation esophagitis Active Problems:   COPD (chronic obstructive pulmonary disease) (HCC)   Primary squamous cell carcinoma of bronchus of right lower lobe (HCC)   Dysphagia   Aspiration pneumonia (HCC)   Hypokalemia   QT prolongation   Hypernatremia   Severe dysphagia/odynophagia secondary to possible radiation esophagitis/strictures CT chest with contrast showing findings consistent with esophagitis, possibly radiation-induced given history GI on board, s/p EGD on 09/13/2023 showed benign-appearing esophageal stenosis, high risk of perforation, so no dilatation was done, may ultimately need repeat EGD with balloon dilatation based on symptom improvement in 4 weeks. SLP consulted, no further recs likely esophageal  Continue IVF, sucralfate,  Protonix and antiemetics Full liquid diet for now, aspiration precautions   Possible aspiration pneumonia Currently afebrile, with no leukocytosis CT showing patchy groundglass opacities with bronchial wall thickening in the right middle and lower lobe suspicious for possible aspiration pneumonia Covid neg, resp viral panel neg Continue IV Unasyn x 7 days Aspiration precautions   Hypokalemia/hypomagnesemia QT prolongation Replace as needed Avoid QT prolonging drugs if possible  Oropharyngeal candidiasis Noted Candida in oral cavity Nystatin swish and swallow, IV fluconazole   Stage IIIb non-small cell lung cancer/squamous cell carcinoma Diagnosed in September 2024 status post concurrent chemoradiation and currently on immunotherapy Outpatient oncology follow-up.   COPD/asthma Stable, no wheezing.  Continue home inhalers.   Type 2 diabetes with hypoglycemia Likely 2/2 poor oral intake  A1c 5.3 SSI, Accu-Cheks, hypoglycemic protocol   Hypertension Currently normotensive.  Not on antihypertensives at home.  Malnutrition Likely 2/2 poor oral intake with underlying malignancy Dietitian consulted  Goals of care discussion Patient with very poor prognosis Will need palliative/hospice consultation during this admission pending on clinical course     Estimated body mass index is 12.98 kg/m as calculated from the following:   Height as of this encounter: 5\' 11"  (1.803 m).   Weight as of this encounter: 42.2 kg.     Code Status: Full  Family Communication: Discussed with daughter at bedside  Disposition Plan: Status is: Inpatient The patient will require care spanning > 2 midnights and should be moved to inpatient because: Level of care      Consultants: GI  Procedures: EGD  Antimicrobials: Unasyn  DVT prophylaxis: Lovenox   Objective: Vitals:   09/13/23 1407 09/13/23 1417 09/13/23 1420 09/13/23 1427  BP: 116/73 105/68 120/72 121/72  Pulse: (!) 103 97  99 97  Resp: (!) 34 (!) 28 (!) 27 (!) 24  Temp: 98 F (36.7 C)     TempSrc: Temporal     SpO2:  98% 98% 98%  Weight:      Height:        Intake/Output Summary (Last 24 hours) at 09/13/2023 1747 Last data filed at 09/13/2023 1537 Gross per 24 hour  Intake 2170.41 ml  Output 1 ml  Net 2169.41 ml    Filed Weights   09/08/23 1927 09/13/23 1226  Weight: 42.2 kg 42.2 kg    Exam: General: NAD, frail, very cachectic Cardiovascular: S1, S2 present Respiratory: CTAB Abdomen: Soft, nontender, nondistended, bowel sounds present Musculoskeletal: No bilateral pedal edema noted Skin: Normal Psychiatry: Normal mood     Data Reviewed: CBC: Recent Labs  Lab 09/08/23 1954 09/10/23 0505 09/11/23 0241 09/12/23 0558  WBC 4.1 3.4* 4.5 3.0*  NEUTROABS 2.3 2.0 3.0 1.8  HGB 12.5 10.0* 10.3* 9.9*  HCT 40.3 32.4* 33.4* 32.6*  MCV 95.7 95.3 97.7 97.9  PLT 332 264 260 254   Basic Metabolic Panel: Recent Labs  Lab 09/09/23 0040 09/09/23 0657 09/10/23 0505 09/11/23 0241 09/11/23 0939 09/12/23 0558 09/12/23 1230 09/13/23 0400  NA  --  140 140 143  --  138  --  135  K  --  2.9* 3.3* 2.5*  --  2.8* 3.1* 3.4*  CL  --  104 106 108  --  107  --  104  CO2  --  24 28 25   --  24  --  24  GLUCOSE  --  74 85 85  --  117*  --  109*  BUN  --  11 7* 6*  --  6*  --  <5*  CREATININE  --  0.49 0.52 0.56  --  0.55  --  0.50  CALCIUM  --  8.4* 7.7* 7.9*  --  7.6*  --  7.6*  MG 2.0  --  1.7  --  1.7  --   --  1.6*  PHOS  --   --  2.0*  --   --   --   --   --    GFR: Estimated Creatinine Clearance: 37.4 mL/min (by C-G formula based on SCr of 0.5 mg/dL). Liver Function Tests: Recent Labs  Lab 09/08/23 1954  AST 36  ALT 26  ALKPHOS 99  BILITOT 0.8  PROT 7.7  ALBUMIN 2.7*   Recent Labs  Lab 09/08/23 1954  LIPASE 21   No results for input(s): "AMMONIA" in the last 168 hours. Coagulation Profile: No results for input(s): "INR", "PROTIME" in the last 168 hours. Cardiac Enzymes: No  results for input(s): "CKTOTAL", "CKMB", "CKMBINDEX", "TROPONINI" in the last 168 hours. BNP (last 3 results) No results for input(s): "PROBNP" in the last 8760 hours. HbA1C: No results for input(s): "HGBA1C" in the last 72 hours.  CBG: Recent Labs  Lab 09/12/23 1945 09/12/23 2337 09/13/23 0424 09/13/23 0840 09/13/23 1301  GLUCAP 118* 93 106* 100* 72   Lipid Profile: No results for input(s): "CHOL", "HDL", "LDLCALC", "TRIG", "CHOLHDL", "LDLDIRECT" in the last 72 hours. Thyroid Function Tests: No results for input(s): "TSH", "T4TOTAL", "FREET4", "T3FREE", "THYROIDAB" in the last 72 hours. Anemia Panel: No results for input(s): "VITAMINB12", "FOLATE", "FERRITIN", "TIBC", "IRON", "RETICCTPCT" in the last 72 hours. Urine analysis:    Component Value Date/Time   COLORURINE YELLOW 09/08/2023 1954   APPEARANCEUR HAZY (A) 09/08/2023 1954   LABSPEC 1.022 09/08/2023 1954   PHURINE 6.0 09/08/2023 1954   GLUCOSEU >=500 (A) 09/08/2023 1954  HGBUR NEGATIVE 09/08/2023 1954   BILIRUBINUR NEGATIVE 09/08/2023 1954   KETONESUR 20 (A) 09/08/2023 1954   PROTEINUR NEGATIVE 09/08/2023 1954   UROBILINOGEN 0.2 05/10/2011 0930   NITRITE NEGATIVE 09/08/2023 1954   LEUKOCYTESUR TRACE (A) 09/08/2023 1954   Sepsis Labs: @LABRCNTIP (procalcitonin:4,lacticidven:4)  ) Recent Results (from the past 240 hours)  SARS Coronavirus 2 by RT PCR (hospital order, performed in [facility] hospital lab) *cepheid single result test* Nasopharyngeal Swab     Status: None   Collection Time: 09/09/23  6:57 AM   Specimen: Nasopharyngeal Swab; Nasal Swab  Result Value Ref Range Status   SARS Coronavirus 2 by RT PCR NEGATIVE NEGATIVE Final    Comment: (NOTE) SARS-CoV-2 target nucleic acids are NOT DETECTED.  The SARS-CoV-2 RNA is generally detectable in upper and lower respiratory specimens during the acute phase of infection. The lowest concentration of SARS-CoV-2 viral copies this assay can detect is 250 copies  / mL. A negative result does not preclude SARS-CoV-2 infection and should not be used as the sole basis for treatment or other patient management decisions.  A negative result may occur with improper specimen collection / handling, submission of specimen other than nasopharyngeal swab, presence of viral mutation(s) within the areas targeted by this assay, and inadequate number of viral copies (<250 copies / mL). A negative result must be combined with clinical observations, patient history, and epidemiological information.  Fact Sheet for Patients:   RoadLapTop.co.za  Fact Sheet for Healthcare Providers: http://kim-miller.com/  This test is not yet approved or  cleared by the [place] FDA and has been authorized for detection and/or diagnosis of SARS-CoV-2 by FDA under an Emergency Use Authorization (EUA).  This EUA will remain in effect (meaning this test can be used) for the duration of the COVID-19 declaration under Section 564(b)(1) of the Act, 21 U.S.C. section 360bbb-3(b)(1), unless the authorization is terminated or revoked sooner.  Performed at [facility], [address], [place], [place] [address]   Respiratory (~20 pathogens) panel by PCR     Status: None   Collection Time: 09/09/23  6:57 AM   Specimen: Nasopharyngeal Swab; Respiratory  Result Value Ref Range Status   Adenovirus NOT DETECTED NOT DETECTED Final   Coronavirus 229E NOT DETECTED NOT DETECTED Final    Comment: (NOTE) The Coronavirus on the Respiratory Panel, DOES NOT test for the novel  Coronavirus (2019 nCoV)    Coronavirus HKU1 NOT DETECTED NOT DETECTED Final   Coronavirus NL63 NOT DETECTED NOT DETECTED Final   Coronavirus OC43 NOT DETECTED NOT DETECTED Final   Metapneumovirus NOT DETECTED NOT DETECTED Final   Rhinovirus / Enterovirus NOT DETECTED NOT DETECTED Final   Influenza A NOT DETECTED NOT DETECTED Final   Influenza B NOT  DETECTED NOT DETECTED Final   Parainfluenza Virus 1 NOT DETECTED NOT DETECTED Final   Parainfluenza Virus 2 NOT DETECTED NOT DETECTED Final   Parainfluenza Virus 3 NOT DETECTED NOT DETECTED Final   Parainfluenza Virus 4 NOT DETECTED NOT DETECTED Final   Respiratory Syncytial Virus NOT DETECTED NOT DETECTED Final   Bordetella pertussis NOT DETECTED NOT DETECTED Final   Bordetella Parapertussis NOT DETECTED NOT DETECTED Final   Chlamydophila pneumoniae NOT DETECTED NOT DETECTED Final   Mycoplasma pneumoniae NOT DETECTED NOT DETECTED Final    Comment: Performed at [facility], [address] [place], [place] [address]  Culture, blood (Routine X 2) w Reflex to ID Panel     Status: Abnormal (Preliminary result)  Collection Time: 09/09/23  7:48 AM   Specimen: BLOOD  Result Value Ref Range Status   Specimen Description   Final    BLOOD SITE NOT SPECIFIED Performed at [facility], [address], [place], [place] [address]    Special Requests   Final    BOTTLES DRAWN AEROBIC AND ANAEROBIC Blood Culture adequate volume Performed at [facility], [address], [place], [place] [address]    Culture  Setup Time   Final    GRAM POSITIVE RODS ANAEROBIC BOTTLE ONLY CRITICAL RESULT CALLED TO, READ BACK BY AND VERIFIED WITH: PHARMD Damaris Hippo [id number] @ 2308 FH    Culture (A)  Final    BACILLUS SPECIES NOT CONSISTENT WITH BACILLUS ANTHRACIS Standardized susceptibility testing for this organism is not available. Performed at [facility], [address], [place], [place] [address]    Report Status PENDING  Incomplete  Culture, blood (Routine X 2) w Reflex to ID Panel     Status: None (Preliminary result)   Collection Time: 09/09/23  3:40 PM   Specimen: BLOOD RIGHT ARM  Result Value Ref Range Status   Specimen Description   Final    BLOOD RIGHT ARM Performed at [facility], [address], [place], [place] [address]    Special  Requests   Final    BOTTLES DRAWN AEROBIC AND ANAEROBIC Blood Culture results may not be optimal due to an inadequate volume of blood received in culture bottles Performed at [facility], [address], [place], [place] [address]    Culture   Final    NO GROWTH 4 DAYS Performed at [facility], [address] [place], [place] [address]    Report Status PENDING  Incomplete      Studies: No results found.   Scheduled Meds:  Chlorhexidine Gluconate Cloth  6 each Topical Daily   enoxaparin (LOVENOX) injection  30 mg Subcutaneous Q24H   feeding supplement  1 Container Oral TID BM   insulin aspart  0-6 Units Subcutaneous Q4H   nystatin  5 mL Oral QID   pantoprazole (PROTONIX) IV  40 mg Intravenous Q12H   sodium chloride flush  10-40 mL Intracatheter Q12H   sucralfate  1 g Oral TID WC & HS   umeclidinium bromide  1 puff Inhalation Daily    Continuous Infusions:  ampicillin-sulbactam (UNASYN) IV 3 g (09/13/23 1622)   dextrose 5 % and 0.45 % NaCl Stopped (09/13/23 0933)   fluconazole (DIFLUCAN) IV Stopped (09/13/23 0004)   magnesium sulfate bolus IVPB       LOS: 3 days     Briant Cedar, MD [facility]  If 7PM-7AM, please contact night-coverage [email or web address] 09/13/2023, 5:47 PM

## 2023-09-13 NOTE — Telephone Encounter (Signed)
 Spoke with patients daughter Lamar Laundry this afternoon and she was calling to update our office about patients situation.  Lamar Laundry states that patient has been in the hospital since Thursday, patient is still not eating and drinking well, and her potassium is low.  Lamar Laundry states patient is getting an endoscopy today.  Informed Sonya I appreciated her call for the update and information was relayed to [place], [place]. Informed Sonya to call with any concerns.

## 2023-09-13 NOTE — Op Note (Signed)
 [facility] Patient Name: Kristy Salazar Procedure Date: 09/13/2023 MRN: [id number] Attending MD: Liliane Shi DO, DO, [id number] Date of Birth: March 27, 1941 CSN: [id number] Age: 83 Admit Type: Inpatient Procedure:                Upper GI endoscopy Indications:              Dysphagia, Odynophagia, History radiation for lung                            cancer 4-6 weeks prior. Providers:                Liliane Shi DO, DO, Martha Clan, RN,                            Stephens Shire RN, RN, Rozetta Nunnery, Technician Referring MD:              Medicines:                See the Anesthesia note for documentation of the                            administered medications Complications:            No immediate complications. Estimated Blood Loss:     Estimated blood loss: none. Procedure:                Pre-Anesthesia Assessment:                           - ASA Grade Assessment: III - A patient with severe                            systemic disease.                           - The risks and benefits of the procedure and the                            sedation options and risks were discussed with the                            patient. All questions were answered and informed                            consent was obtained.                           After obtaining informed consent, the endoscope was                            passed under direct vision. Throughout the                            procedure, the patient's blood pressure, pulse, and                            oxygen saturations were  monitored continuously. The                            [id number]-[id number] ([id number]) Olympus endoscope was introduced                            through the mouth, and advanced to the second part                            of duodenum. The upper GI endoscopy was                            accomplished without difficulty. The patient                            tolerated the procedure  well. Scope In: Scope Out: Findings:      One benign appearing, radiation induced, intrinsic, moderate       (circumferential scarring or stenosis; an endoscope may pass) stenosis       was found 30 to 33 cm from the incisors. The stenosis was traversed and       mild mucosal disruption was noted after passage of endoscope.      The Z-line was regular and was found 45 cm from the incisors.      The entire examined stomach was normal.      The examined duodenum was normal. Impression:               - Benign-appearing esophageal stenosis.                           - Z-line regular, 45 cm from the incisors.                           - Normal stomach.                           - Normal examined duodenum.                           - No specimens collected. Moderate Sedation:      See the other procedure note for documentation of moderate sedation with       intraservice time. Recommendation:           - Return patient to hospital ward for ongoing care.                           - Full liquid diet.                           - Continue present medications.                           - May ultimately need repeat EGD with balloon                            dilatation to esophageal stenosis based on symptom  improvement. Can consider repeat EGD in roughly 4                            weeks.                           - [facility] will sign off and be available should                            any questions arise. Procedure Code(s):        --- Professional ---                           [phone], Esophagogastroduodenoscopy, flexible,                            transoral; diagnostic, including collection of                            specimen(s) by brushing or washing, when performed                            (separate procedure) Diagnosis Code(s):        --- Professional ---                           K22.2, Esophageal obstruction                           R13.10,  Dysphagia, unspecified CPT copyright 2022 American Medical Association. All rights reserved. The codes documented in this report are preliminary and upon coder review may  be revised to meet current compliance requirements. Dr Liliane Shi, DO Liliane Shi DO, DO 09/13/2023 2:14:14 PM Number of Addenda: 0

## 2023-09-13 NOTE — Interval H&P Note (Signed)
 History and Physical Interval Note:  09/13/2023 12:44 PM  Kristy Salazar  has presented today for surgery, with the diagnosis of Dysphagia.  The various methods of treatment have been discussed with the patient and family. After consideration of risks, benefits and other options for treatment, the patient has consented to  Procedure(s): ESOPHAGOGASTRODUODENOSCOPY (EGD) WITH PROPOFOL (N/A) as a surgical intervention.  The patient's history has been reviewed, patient examined, no change in status, stable for surgery.  I have reviewed the patient's chart and labs.  Questions were answered to the patient's satisfaction.     Lynann Bologna

## 2023-09-13 NOTE — Evaluation (Signed)
 Physical Therapy Evaluation Patient Details Name: Kristy Salazar MRN: [id number] DOB: Dec 04, 1940 Today's Date: 09/13/2023  History of Present Illness  83 yo female admitted with radiation esophagitis, severe dysphagia. Hx of NSCLC, COPD, DM, asp pna, dysphagia  Clinical Impression  On eval, pt was CGA for mobility. She walked ~100 feet with a RW. Pt tolerated activity well. Will plan to follow and progress activity as tolerated. Pt politely declines HH f/u at this time. She has a very supportive family.         If plan is discharge home, recommend the following: A little help with walking and/or transfers;A little help with bathing/dressing/bathroom;Assistance with cooking/housework;Assist for transportation;Help with stairs or ramp for entrance   Can travel by private vehicle        Equipment Recommendations None recommended by PT  Recommendations for Other Services       Functional Status Assessment Patient has had a recent decline in their functional status and demonstrates the ability to make significant improvements in function in a reasonable and predictable amount of time.     Precautions / Restrictions Restrictions Weight Bearing Restrictions Per Provider Order: No      Mobility  Bed Mobility Overal bed mobility: Modified Independent                  Transfers Overall transfer level: Needs assistance Equipment used: Rolling walker (2 wheels) Transfers: Sit to/from Stand Sit to Stand: Supervision           General transfer comment: Supv for safety, lines.    Ambulation/Gait Ambulation/Gait assistance: Contact guard assist Gait Distance (Feet): 100 Feet Assistive device: Rolling walker (2 wheels) Gait Pattern/deviations: Step-through pattern, Decreased stride length, Trunk flexed       General Gait Details: Cues for safety. Tends to keep RW too far ahead. No LOB with RW use. Tolerated distance well.  Stairs            Wheelchair Mobility      Tilt Bed    Modified Rankin (Stroke Patients Only)       Balance Overall balance assessment: Needs assistance         Standing balance support: Bilateral upper extremity supported, During functional activity, Reliant on assistive device for balance Standing balance-Leahy Scale: Fair                               Pertinent Vitals/Pain Pain Assessment Pain Assessment: Faces Faces Pain Scale: Hurts a little bit Pain Location: stomach Pain Descriptors / Indicators: Discomfort Pain Intervention(s): Monitored during session, Repositioned    Home Living Family/patient expects to be discharged to:: Private residence Living Arrangements: Children Available Help at Discharge: Family Type of Home: House Home Access: Stairs to enter Entrance Stairs-Rails: Right;Left;Can reach both Secretary/administrator of Steps: 6   Home Layout: One level Home Equipment: Shower seat;Cane - single Librarian, academic (2 wheels);Rollator (4 wheels)      Prior Function Prior Level of Function : Independent/Modified Independent             Mobility Comments: uses walker       Extremity/Trunk Assessment   Upper Extremity Assessment Upper Extremity Assessment: Defer to OT evaluation    Lower Extremity Assessment Lower Extremity Assessment: Generalized weakness    Cervical / Trunk Assessment Cervical / Trunk Assessment: Kyphotic  Communication   Communication Communication: No apparent difficulties    Cognition Arousal: Alert Behavior During Therapy: [facility]  for tasks assessed/performed   PT - Cognitive impairments: No apparent impairments                         Following commands: Intact       Cueing       General Comments      Exercises     Assessment/Plan    PT Assessment Patient needs continued PT services  PT Problem List Decreased strength;Decreased activity tolerance;Decreased balance;Decreased mobility;Decreased knowledge of use of  DME       PT Treatment Interventions Gait training;Functional mobility training;Therapeutic activities;Therapeutic exercise;Patient/family education;DME instruction;Balance training    PT Goals (Current goals can be found in the Care Plan section)  Acute Rehab PT Goals Patient Stated Goal: to get some food PT Goal Formulation: With patient Time For Goal Achievement: 09/27/23 Potential to Achieve Goals: Good    Frequency Min 1X/week     Co-evaluation               AM-PAC PT "6 Clicks" Mobility  Outcome Measure Help needed turning from your back to your side while in a flat bed without using bedrails?: None Help needed moving from lying on your back to sitting on the side of a flat bed without using bedrails?: None Help needed moving to and from a bed to a chair (including a wheelchair)?: A Little Help needed standing up from a chair using your arms (e.g., wheelchair or bedside chair)?: A Little Help needed to walk in hospital room?: A Little Help needed climbing 3-5 steps with a railing? : A Little 6 Click Score: 20    End of Session   Activity Tolerance: Patient tolerated treatment well Patient left: in bed;with call bell/phone within reach;with family/visitor present   PT Visit Diagnosis: Muscle weakness (generalized) (M62.81)    Time: 1050-1100 PT Time Calculation (min) (ACUTE ONLY): 10 min   Charges:   PT Evaluation $PT Eval Low Complexity: 1 Low   PT General Charges $$ ACUTE PT VISIT: 1 Visit           Faye Ramsay, PT Acute Rehabilitation  Office: [phone]

## 2023-09-13 NOTE — Anesthesia Procedure Notes (Signed)
 Procedure Name: MAC Date/Time: 09/13/2023 1:50 PM  Performed by: Dennison Nancy, CRNAPre-anesthesia Checklist: Patient identified, Emergency Drugs available, Suction available, Timeout performed and Patient being monitored Patient Re-evaluated:Patient Re-evaluated prior to induction Oxygen Delivery Method: Simple face mask Preoxygenation: Pre-oxygenation with 100% oxygen Induction Type: IV induction Dental Injury: Teeth and Oropharynx as per pre-operative assessment

## 2023-09-14 DIAGNOSIS — K208 Other esophagitis without bleeding: Secondary | ICD-10-CM | POA: Diagnosis not present

## 2023-09-14 DIAGNOSIS — T66XXXA Radiation sickness, unspecified, initial encounter: Secondary | ICD-10-CM | POA: Diagnosis not present

## 2023-09-14 LAB — CBC
HCT: 33.1 % — ABNORMAL LOW (ref 36.0–46.0)
Hemoglobin: 10.2 g/dL — ABNORMAL LOW (ref 12.0–15.0)
MCH: 29.5 pg (ref 26.0–34.0)
MCHC: 30.8 g/dL (ref 30.0–36.0)
MCV: 95.7 fL (ref 80.0–100.0)
Platelets: 315 10*3/uL (ref 150–400)
RBC: 3.46 MIL/uL — ABNORMAL LOW (ref 3.87–5.11)
RDW: 17 % — ABNORMAL HIGH (ref 11.5–15.5)
WBC: 3.4 10*3/uL — ABNORMAL LOW (ref 4.0–10.5)
nRBC: 0 % (ref 0.0–0.2)

## 2023-09-14 LAB — CULTURE, BLOOD (ROUTINE X 2)
Culture: NO GROWTH
Special Requests: ADEQUATE

## 2023-09-14 LAB — BASIC METABOLIC PANEL
Anion gap: 7 (ref 5–15)
BUN: <5 mg/dL — ABNORMAL LOW (ref 8–23)
CO2: 23 mmol/L (ref 22–32)
Calcium: 7.7 mg/dL — ABNORMAL LOW (ref 8.9–10.3)
Chloride: 106 mmol/L (ref 98–111)
Creatinine, Ser: 0.51 mg/dL (ref 0.44–1.00)
GFR, Estimated: >60 mL/min (ref >60)
Glucose, Bld: 121 mg/dL — ABNORMAL HIGH (ref 70–99)
Potassium: 3.3 mmol/L — ABNORMAL LOW (ref 3.5–5.1)
Sodium: 136 mmol/L (ref 135–145)

## 2023-09-14 LAB — GLUCOSE, CAPILLARY
Glucose-Capillary: 74 mg/dL (ref 70–99)
Glucose-Capillary: 129 mg/dL — ABNORMAL HIGH (ref 70–99)
Glucose-Capillary: 111 mg/dL — ABNORMAL HIGH (ref 70–99)
Glucose-Capillary: 137 mg/dL — ABNORMAL HIGH (ref 70–99)
Glucose-Capillary: 110 mg/dL — ABNORMAL HIGH (ref 70–99)
Glucose-Capillary: 116 mg/dL — ABNORMAL HIGH (ref 70–99)

## 2023-09-14 LAB — MAGNESIUM: Magnesium: 2.2 mg/dL (ref 1.7–2.4)

## 2023-09-14 MED ORDER — POTASSIUM CHLORIDE CRYS ER 20 MEQ PO TBCR
40.0000 meq | EXTENDED_RELEASE_TABLET | Freq: Once | ORAL | Status: AC
  Administered 2023-09-14: 40 meq via ORAL
  Filled 2023-09-14: qty 2

## 2023-09-14 NOTE — Plan of Care (Addendum)
 VSS. BG stable overnight. Patient did not require any correction doses of insulin. D5 1/2NS infusing at 169ml/hr. Daughter remains at bedside. No acute events overnight.  Problem: Education: Goal: Ability to describe self-care measures that may prevent or decrease complications (Diabetes Survival Skills Education) will improve Outcome: Progressing   Problem: Fluid Volume: Goal: Ability to maintain a balanced intake and output will improve Outcome: Progressing   Problem: Metabolic: Goal: Ability to maintain appropriate glucose levels will improve Outcome: Progressing   Problem: Nutritional: Goal: Maintenance of adequate nutrition will improve Outcome: Progressing   Problem: Education: Goal: Knowledge of General Education information will improve Description: Including pain rating scale, medication(s)/side effects and non-pharmacologic comfort measures Outcome: Progressing   Problem: Clinical Measurements: Goal: Ability to maintain clinical measurements within normal limits will improve Outcome: Progressing Goal: Will remain free from infection Outcome: Progressing   Problem: Coping: Goal: Level of anxiety will decrease Outcome: Progressing   Problem: Pain Managment: Goal: General experience of comfort will improve and/or be controlled Outcome: Progressing   Problem: Safety: Goal: Ability to remain free from injury will improve Outcome: Progressing

## 2023-09-14 NOTE — Anesthesia Postprocedure Evaluation (Signed)
 Anesthesia Post Note  Patient: Kristy Salazar  Procedure(s) Performed: ESOPHAGOGASTRODUODENOSCOPY (EGD) WITH PROPOFOL     Patient location during evaluation: PACU Anesthesia Type: MAC Level of consciousness: awake and alert Pain management: pain level controlled Vital Signs Assessment: post-procedure vital signs reviewed and stable Respiratory status: spontaneous breathing, nonlabored ventilation, respiratory function stable and patient connected to nasal cannula oxygen Cardiovascular status: stable and blood pressure returned to baseline Postop Assessment: no apparent nausea or vomiting Anesthetic complications: no   No notable events documented.  Last Vitals:  Vitals:   09/13/23 2031 09/14/23 0338  BP: 93/65 103/69  Pulse: 95 78  Resp: 16 16  Temp: (!) 36.3 C 36.9 C  SpO2: 96% 97%    Last Pain:  Vitals:   09/13/23 2041  TempSrc:   PainSc: 0-No pain   Pain Goal:                   Daya Dutt

## 2023-09-14 NOTE — Progress Notes (Signed)
 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  She was admitted for dysphagia/odynophagia and concern for esophagitis (possibly radiation induced).  Now s/p EGD with benign appearing stenosis.    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 SLP consulted, no further recs likely esophageal  Discussed with GI, ok with advancing to soft diet Continue IVF, sucralfate, Protonix and antiemetics   Possible aspiration pneumonia Currently afebrile, no leukocytosis CT showing patchy groundglass opacities with bronchial wall thickening in the right middle and lower lobe suspicious for possible aspiration pneumonia Covid neg, resp viral panel neg Continue IV Unasyn x 7 days Aspiration precautions   Hypokalemia/hypomagnesemia Replace as needed  QT prolongation Avoid QT prolonging drugs if  possible Repeat EKG intermittently    Oropharyngeal candidiasis Prior provider noted on exam Nystatin swish and swallow, IV fluconazole   Stage IIIb non-small cell lung cancer/squamous cell carcinoma Diagnosed in September 2024 status post concurrent chemoradiation and currently on immunotherapy Outpatient oncology follow-up.   COPD/asthma Stable, no wheezing.  Continue home inhalers.   Type 2 diabetes with hypoglycemia Likely 2/2 poor oral intake  A1c 5.3 SSI, Accu-Cheks, hypoglycemic protocol   Hypertension Currently normotensive.  Not on antihypertensives at home.   Severe Protein Calorie Malnutrition Likely 2/2 poor oral intake with underlying malignancy Dietitian consulted   Goals of care discussion Patient with very poor prognosis Consider involvement of palliative care in near future    DVT prophylaxis: lovenox Code Status: full Family Communication: daughter, granddaughter at bedside Disposition:   Status is: Inpatient Remains inpatient appropriate because: awaiting tolerance of PO diet   Consultants:  GI  Procedures:  EGD - Benign- appearing esophageal stenosis. - Z- line regular, 45 cm from the incisors. - Normal stomach. - Normal examined duodenum. - No specimens collected. - Return patient to hospital ward for ongoing care. - Full liquid diet. - Continue present medications. - May ultimately need repeat EGD with balloon dilatation to esophageal stenosis based on symptom improvement. Can consider repeat EGD in roughly 4 weeks. - [facility] will sign off and be available should any questions arise. Antimicrobials:  Anti-infectives (From admission, onward)    Start     Dose/Rate Route Frequency Ordered Stop   09/10/23 1600  Ampicillin-Sulbactam (UNASYN) 3 g in sodium chloride 0.9 % 100 mL IVPB        3 g 200 mL/hr over 30 Minutes Intravenous Every 6 hours 09/10/23 1153 09/15/23 2359   09/09/23 2200  fluconazole (DIFLUCAN)  IVPB 200 mg        200 mg 100  mL/hr over 60 Minutes Intravenous Every 24 hours 09/09/23 1952     09/09/23 0730  Ampicillin-Sulbactam (UNASYN) 3 g in sodium chloride 0.9 % 100 mL IVPB  Status:  Discontinued        3 g 200 mL/hr over 30 Minutes Intravenous Every 6 hours 09/09/23 0710 09/10/23 1153       Subjective: Asking to eat -> wants her diet advanced from where it was last night Daughter and granddaughter at bedside  Objective: Vitals:   09/13/23 2031 09/14/23 0338 09/14/23 0934 09/14/23 0937  BP: 93/65 103/69    Pulse: 95 78    Resp: 16 16    Temp: (!) 97.4 F (36.3 C) 98.5 F (36.9 C)    TempSrc:      SpO2: 96% 97% 97% 97%  Weight:      Height:        Intake/Output Summary (Last 24 hours) at 09/14/2023 0940 Last data filed at 09/14/2023 0700 Gross per 24 hour  Intake 2322.31 ml  Output 950 ml  Net 1372.31 ml   Filed Weights   09/08/23 1927 09/13/23 1226  Weight: 42.2 kg 42.2 kg    Examination:  General exam: Appears calm and comfortable - cachetic  Respiratory system: Clear to auscultation. Respiratory effort normal. Cardiovascular system: RRR Gastrointestinal system: Abdomen is nondistended, soft and nontender. Central nervous system: Alert and oriented. No focal neurological deficits. Extremities: no LEE    Data Reviewed: I have personally reviewed following labs and imaging studies  CBC: Recent Labs  Lab 09/08/23 1954 09/10/23 0505 09/11/23 0241 09/12/23 0558 09/14/23 0242  WBC 4.1 3.4* 4.5 3.0* 3.4*  NEUTROABS 2.3 2.0 3.0 1.8  --   HGB 12.5 10.0* 10.3* 9.9* 10.2*  HCT 40.3 32.4* 33.4* 32.6* 33.1*  MCV 95.7 95.3 97.7 97.9 95.7  PLT 332 264 260 254 315    Basic Metabolic Panel: Recent Labs  Lab 09/09/23 0040 09/09/23 0657 09/10/23 0505 09/11/23 0241 09/11/23 0939 09/12/23 0558 09/12/23 1230 09/13/23 0400 09/14/23 0242  NA  --    < > 140 143  --  138  --  135 136  K  --    < > 3.3* 2.5*  --  2.8* 3.1* 3.4* 3.3*  CL  --    < > 106 108  --  107  --  104 106  CO2   --    < > 28 25  --  24  --  24 23  GLUCOSE  --    < > 85 85  --  117*  --  109* 121*  BUN  --    < > 7* 6*  --  6*  --  <5* <5*  CREATININE  --    < > 0.52 0.56  --  0.55  --  0.50 0.51  CALCIUM  --    < > 7.7* 7.9*  --  7.6*  --  7.6* 7.7*  MG 2.0  --  1.7  --  1.7  --   --  1.6* 2.2  PHOS  --   --  2.0*  --   --   --   --   --   --    < > = values in this interval not displayed.    GFR: Estimated Creatinine Clearance: 37.4 mL/min (by C-G formula based on SCr of 0.51 mg/dL).  Liver Function Tests: Recent Labs  Lab 09/08/23 1954  AST 36  ALT 26  ALKPHOS 99  BILITOT 0.8  PROT 7.7  ALBUMIN 2.7*    CBG: Recent Labs  Lab 09/13/23 1647 09/13/23 2030 09/13/23 2358 09/14/23 0338 09/14/23 0750  GLUCAP 74 144* 118* 129* 111*     Recent Results (from the past 240 hours)  SARS Coronavirus 2 by RT PCR (hospital order, performed in [facility] hospital lab) *cepheid single result test* Nasopharyngeal Swab     Status: None   Collection Time: 09/09/23  6:57 AM   Specimen: Nasopharyngeal Swab; Nasal Swab  Result Value Ref Range Status   SARS Coronavirus 2 by RT PCR NEGATIVE NEGATIVE Final    Comment: (NOTE) SARS-CoV-2 target nucleic acids are NOT DETECTED.  The SARS-CoV-2 RNA is generally detectable in upper and lower respiratory specimens during the acute phase of infection. The lowest concentration of SARS-CoV-2 viral copies this assay can detect is 250 copies / mL. Aurelie Dicenzo negative result does not preclude SARS-CoV-2 infection and should not be used as the sole basis for treatment or other patient management decisions.  Arlow Spiers negative result may occur with improper specimen collection / handling, submission of specimen other than nasopharyngeal swab, presence of viral mutation(s) within the areas targeted by this assay, and inadequate number of viral copies (<250 copies / mL). [facility] negative result must be combined with clinical observations, patient history, and epidemiological  information.  Fact Sheet for Patients:   RoadLapTop.co.za  Fact Sheet for Healthcare Providers: http://kim-miller.com/  This test is not yet approved or  cleared by the [place] FDA and has been authorized for detection and/or diagnosis of SARS-CoV-2 by FDA under an Emergency Use Authorization (EUA).  This EUA will remain in effect (meaning this test can be used) for the duration of the COVID-19 declaration under Section 564(b)(1) of the Act, 21 U.S.C. section 360bbb-3(b)(1), unless the authorization is terminated or revoked sooner.  Performed at [facility], [address], [place], [place] [address]   Respiratory (~20 pathogens) panel by PCR     Status: None   Collection Time: 09/09/23  6:57 AM   Specimen: Nasopharyngeal Swab; Respiratory  Result Value Ref Range Status   Adenovirus NOT DETECTED NOT DETECTED Final   Coronavirus 229E NOT DETECTED NOT DETECTED Final    Comment: (NOTE) The Coronavirus on the Respiratory Panel, DOES NOT test for the novel  Coronavirus (2019 nCoV)    Coronavirus HKU1 NOT DETECTED NOT DETECTED Final   Coronavirus NL63 NOT DETECTED NOT DETECTED Final   Coronavirus OC43 NOT DETECTED NOT DETECTED Final   Metapneumovirus NOT DETECTED NOT DETECTED Final   Rhinovirus / Enterovirus NOT DETECTED NOT DETECTED Final   Influenza [place] NOT DETECTED NOT DETECTED Final   Influenza B NOT DETECTED NOT DETECTED Final   Parainfluenza Virus 1 NOT DETECTED NOT DETECTED Final   Parainfluenza Virus 2 NOT DETECTED NOT DETECTED Final   Parainfluenza Virus 3 NOT DETECTED NOT DETECTED Final   Parainfluenza Virus 4 NOT DETECTED NOT DETECTED Final   Respiratory Syncytial Virus NOT DETECTED NOT DETECTED Final   Bordetella pertussis NOT DETECTED NOT DETECTED Final   Bordetella Parapertussis NOT DETECTED NOT DETECTED Final   Chlamydophila pneumoniae NOT DETECTED NOT DETECTED Final   Mycoplasma pneumoniae NOT  DETECTED NOT DETECTED Final    Comment: Performed at [facility], [address] [place], [place] [address]  Culture, blood (Routine X 2) w Reflex to ID Panel     Status: Abnormal (Preliminary  result)   Collection Time: 09/09/23  7:48 AM   Specimen: BLOOD  Result Value Ref Range Status   Specimen Description   Final    BLOOD SITE NOT SPECIFIED Performed at [facility], [address] [place], [place] [address]    Special Requests   Final    BOTTLES DRAWN AEROBIC AND ANAEROBIC Blood Culture adequate volume Performed at [facility], [address] [place], [place] [address]    Culture  Setup Time   Final    GRAM POSITIVE RODS ANAEROBIC BOTTLE ONLY CRITICAL RESULT CALLED TO, READ BACK BY AND VERIFIED WITH: PHARMD Damaris Hippo [id number] @ 2308 FH    Culture (Angelis Gates)  Final    BACILLUS SPECIES NOT CONSISTENT WITH BACILLUS ANTHRACIS Standardized susceptibility testing for this organism is not available. Performed at [facility], [address], [place], [place] [address]    Report Status PENDING  Incomplete  Culture, blood (Routine X 2) w Reflex to ID Panel     Status: None   Collection Time: 09/09/23  3:40 PM   Specimen: BLOOD RIGHT ARM  Result Value Ref Range Status   Specimen Description   Final    BLOOD RIGHT ARM Performed at [facility], [address], [place], [place] [address]    Special Requests   Final    BOTTLES DRAWN AEROBIC AND ANAEROBIC Blood Culture results may not be optimal due to an inadequate volume of blood received in culture bottles Performed at [facility], [address], [place], [place] [address]    Culture   Final    NO GROWTH 5 DAYS Performed at [facility], [address], [place], [place] [address]    Report Status 09/14/2023 FINAL  Final         Radiology Studies: No results found.      Scheduled Meds:  Chlorhexidine Gluconate Cloth  6 each Topical Daily    enoxaparin (LOVENOX) injection  30 mg Subcutaneous Q24H   feeding supplement  1 Container Oral TID BM   insulin aspart  0-6 Units Subcutaneous Q4H   nystatin  5 mL Oral QID   pantoprazole (PROTONIX) IV  40 mg Intravenous Q12H   sodium chloride flush  10-40 mL Intracatheter Q12H   sucralfate  1 g Oral TID WC & HS   umeclidinium bromide  1 puff Inhalation Daily   Continuous Infusions:  ampicillin-sulbactam (UNASYN) IV 3 g (09/14/23 0429)   dextrose 5 % and 0.45 % NaCl 100 mL/hr at 09/13/23 2041   fluconazole (DIFLUCAN) IV 200 mg (09/13/23 2302)     LOS: 4 days    Time spent: over 30 min    Lacretia Nicks, MD [facility]   To contact the attending provider between 7A-7P or the covering provider during after hours 7P-7A, please log into the web site [email or web address] and access using universal [place] password for that web site. If you do not have the password, please call the hospital operator.  09/14/2023, 9:40 AM

## 2023-09-14 NOTE — Evaluation (Signed)
 Occupational Therapy Evaluation Patient Details Name: Kristy Salazar MRN: [id number] DOB: [birth year] Today's Date: 09/14/2023   History of Present Illness   Pt is a 83 yo female admitted 09/08/23 with radiation esophagitis, severe dysphagia. Hx of NSCLC, COPD, DM, asp pna, dysphagia     Clinical Impressions Pt at this time presented with family in the room. At this time family reporting they can assist as needed with the transition to home. Pt was able to complete simulated dressing while sitting at the EOB independently. She then was able to complete mobility with RW and supervision for line management. Pt just reported they did not want to complete to much activity due to not having anything to eat. Pt and the family reported having no needs at this time. Acute Occupational Therapy signing off. Thank you.       If plan is discharge home, recommend the following:   Assist for transportation;Help with stairs or ramp for entrance;Assistance with cooking/housework     Functional Status Assessment         Equipment Recommendations   Other (comment) (recomendation to replace suction cup grab bars with built in grab bars)     Recommendations for Other Services         Precautions/Restrictions   Precautions Precautions: Fall Restrictions Weight Bearing Restrictions Per Provider Order: No     Mobility Bed Mobility Overal bed mobility: Modified Independent                  Transfers Overall transfer level: Needs assistance Equipment used: Rolling walker (2 wheels) Transfers: Sit to/from Stand Sit to Stand: Supervision           General transfer comment: for line management      Balance Overall balance assessment: Needs assistance Sitting-balance support: Feet supported Sitting balance-Leahy Scale: Good     Standing balance support: Bilateral upper extremity supported, No upper extremity supported Standing balance-Leahy Scale: Fair Standing  balance comment: able to complete standing with no BUE and dynamic reaching                           ADL either performed or assessed with clinical judgement   ADL Overall ADL's : Needs assistance/impaired                                             Vision Baseline Vision/History: 1 Wears glasses Ability to See in Adequate Light: 0 Adequate Patient Visual Report: No change from baseline       Perception         Praxis Praxis: WFL       Pertinent Vitals/Pain Pain Assessment Pain Assessment: Faces Faces Pain Scale: Hurts a little bit Pain Location: stomach Pain Descriptors / Indicators: Discomfort Pain Intervention(s): Limited activity within patient's tolerance     Extremity/Trunk Assessment Upper Extremity Assessment Upper Extremity Assessment: Generalized weakness   Lower Extremity Assessment Lower Extremity Assessment: Defer to PT evaluation   Cervical / Trunk Assessment Cervical / Trunk Assessment: Kyphotic   Communication Communication Communication: No apparent difficulties   Cognition Arousal: Alert Behavior During Therapy: WFL for tasks assessed/performed Cognition: No apparent impairments                               Following commands:  Intact       Cueing  General Comments          Exercises     Shoulder Instructions      Home Living Family/patient expects to be discharged to:: Private residence Living Arrangements: Children Available Help at Discharge: Family Type of Home: House Home Access: Stairs to enter Secretary/administrator of Steps: 6 Entrance Stairs-Rails: Right;Left;Can reach both Home Layout: One level     Bathroom Shower/Tub: Chief Strategy Officer: Handicapped height     Home Equipment: Shower seat;Cane - single Librarian, academic (2 wheels);Rollator (4 wheels);Grab bars - tub/shower (pt recomendation to have installed grab bars)          Prior  Functioning/Environment Prior Level of Function : Independent/Modified Independent             Mobility Comments: uses walker ADLs Comments: sometimes the daughter uses    OT Problem List:     OT Treatment/Interventions:        OT Goals(Current goals can be found in the care plan section)       OT Frequency:  Min 1X/week    Co-evaluation              AM-PAC OT "6 Clicks" Daily Activity     Outcome Measure Help from another person eating meals?: None Help from another person taking care of personal grooming?: None Help from another person toileting, which includes using toliet, bedpan, or urinal?: None Help from another person bathing (including washing, rinsing, drying)?: None Help from another person to put on and taking off regular upper body clothing?: None Help from another person to put on and taking off regular lower body clothing?: None 6 Click Score: 24   End of Session Equipment Utilized During Treatment: Gait belt;Rolling walker (2 wheels) Nurse Communication: Mobility status  Activity Tolerance: Patient tolerated treatment well Patient left: in bed;with call bell/phone within reach;with family/visitor present  OT Visit Diagnosis: Unsteadiness on feet (R26.81);Other abnormalities of gait and mobility (R26.89);Repeated falls (R29.6);Muscle weakness (generalized) (M62.81)                Time: 4098-1191 OT Time Calculation (min): 16 min Charges:  OT General Charges $OT Visit: 1 Visit OT Evaluation $OT Eval Low Complexity: 1 Low  Presley Raddle OTR/L  Acute Rehab Services  [phone] office number   Alphia Moh 09/14/2023, 10:39 AM

## 2023-09-14 NOTE — Plan of Care (Signed)
  Problem: Coping: Goal: Ability to adjust to condition or change in health will improve Outcome: Progressing   Problem: Fluid Volume: Goal: Ability to maintain a balanced intake and output will improve Outcome: Progressing   Problem: Health Behavior/Discharge Planning: Goal: Ability to identify and utilize available resources and services will improve Outcome: Progressing   Problem: Metabolic: Goal: Ability to maintain appropriate glucose levels will improve Outcome: Progressing   Problem: Nutritional: Goal: Maintenance of adequate nutrition will improve Outcome: Progressing Goal: Progress toward achieving an optimal weight will improve Outcome: Progressing   Problem: Skin Integrity: Goal: Risk for impaired skin integrity will decrease Outcome: Progressing   Problem: Tissue Perfusion: Goal: Adequacy of tissue perfusion will improve Outcome: Progressing   Problem: Education: Goal: Knowledge of General Education information will improve Description: Including pain rating scale, medication(s)/side effects and non-pharmacologic comfort measures Outcome: Progressing   Problem: Health Behavior/Discharge Planning: Goal: Ability to manage health-related needs will improve Outcome: Progressing   Problem: Clinical Measurements: Goal: Will remain free from infection Outcome: Progressing Goal: Diagnostic test results will improve Outcome: Progressing Goal: Respiratory complications will improve Outcome: Progressing   Problem: Activity: Goal: Risk for activity intolerance will decrease Outcome: Progressing   Problem: Nutrition: Goal: Adequate nutrition will be maintained Outcome: Progressing   Problem: Elimination: Goal: Will not experience complications related to bowel motility Outcome: Progressing Goal: Will not experience complications related to urinary retention Outcome: Progressing   Problem: Pain Managment: Goal: General experience of comfort will improve  and/or be controlled Outcome: Progressing   Problem: Safety: Goal: Ability to remain free from injury will improve Outcome: Progressing   Problem: Skin Integrity: Goal: Risk for impaired skin integrity will decrease Outcome: Progressing

## 2023-09-15 ENCOUNTER — Other Ambulatory Visit (HOSPITAL_BASED_OUTPATIENT_CLINIC_OR_DEPARTMENT_OTHER): Payer: Self-pay

## 2023-09-15 ENCOUNTER — Encounter (HOSPITAL_COMMUNITY): Payer: Self-pay | Admitting: Internal Medicine

## 2023-09-15 ENCOUNTER — Encounter: Payer: Self-pay | Admitting: Internal Medicine

## 2023-09-15 ENCOUNTER — Other Ambulatory Visit (HOSPITAL_COMMUNITY): Payer: Self-pay

## 2023-09-15 DIAGNOSIS — T66XXXA Radiation sickness, unspecified, initial encounter: Secondary | ICD-10-CM | POA: Diagnosis not present

## 2023-09-15 DIAGNOSIS — K208 Other esophagitis without bleeding: Secondary | ICD-10-CM | POA: Diagnosis not present

## 2023-09-15 LAB — CBC WITH DIFFERENTIAL/PLATELET
Abs Immature Granulocytes: 0.04 10*3/uL (ref 0.00–0.07)
Basophils Absolute: 0 10*3/uL (ref 0.0–0.1)
Basophils Relative: 0 %
Eosinophils Absolute: 0.1 10*3/uL (ref 0.0–0.5)
Eosinophils Relative: 2 %
HCT: 35.6 % — ABNORMAL LOW (ref 36.0–46.0)
Hemoglobin: 10.8 g/dL — ABNORMAL LOW (ref 12.0–15.0)
Immature Granulocytes: 1 %
Lymphocytes Relative: 17 %
Lymphs Abs: 0.8 10*3/uL (ref 0.7–4.0)
MCH: 29.5 pg (ref 26.0–34.0)
MCHC: 30.3 g/dL (ref 30.0–36.0)
MCV: 97.3 fL (ref 80.0–100.0)
Monocytes Absolute: 0.5 10*3/uL (ref 0.1–1.0)
Monocytes Relative: 9 %
Neutro Abs: 3.5 10*3/uL (ref 1.7–7.7)
Neutrophils Relative %: 71 %
Platelets: 307 10*3/uL (ref 150–400)
RBC: 3.66 MIL/uL — ABNORMAL LOW (ref 3.87–5.11)
RDW: 16.8 % — ABNORMAL HIGH (ref 11.5–15.5)
WBC: 5 10*3/uL (ref 4.0–10.5)
nRBC: 0 % (ref 0.0–0.2)

## 2023-09-15 LAB — GLUCOSE, CAPILLARY
Glucose-Capillary: 94 mg/dL (ref 70–99)
Glucose-Capillary: 95 mg/dL (ref 70–99)
Glucose-Capillary: 96 mg/dL (ref 70–99)

## 2023-09-15 LAB — COMPREHENSIVE METABOLIC PANEL
ALT: 22 U/L (ref 0–44)
AST: 33 U/L (ref 15–41)
Albumin: 2.1 g/dL — ABNORMAL LOW (ref 3.5–5.0)
Alkaline Phosphatase: 106 U/L (ref 38–126)
Anion gap: 6 (ref 5–15)
BUN: <5 mg/dL — ABNORMAL LOW (ref 8–23)
CO2: 21 mmol/L — ABNORMAL LOW (ref 22–32)
Calcium: 8.3 mg/dL — ABNORMAL LOW (ref 8.9–10.3)
Chloride: 109 mmol/L (ref 98–111)
Creatinine, Ser: 0.57 mg/dL (ref 0.44–1.00)
GFR, Estimated: >60 mL/min (ref >60)
Glucose, Bld: 111 mg/dL — ABNORMAL HIGH (ref 70–99)
Potassium: 4.1 mmol/L (ref 3.5–5.1)
Sodium: 136 mmol/L (ref 135–145)
Total Bilirubin: 0.4 mg/dL (ref 0.0–1.2)
Total Protein: 6.3 g/dL — ABNORMAL LOW (ref 6.5–8.1)

## 2023-09-15 LAB — PHOSPHORUS: Phosphorus: 1.7 mg/dL — ABNORMAL LOW (ref 2.5–4.6)

## 2023-09-15 LAB — MAGNESIUM: Magnesium: 1.9 mg/dL (ref 1.7–2.4)

## 2023-09-15 MED ORDER — PANTOPRAZOLE SODIUM 40 MG PO TBEC
40.0000 mg | DELAYED_RELEASE_TABLET | Freq: Every day | ORAL | 1 refills | Status: DC
  Filled 2023-09-15: qty 30, 30d supply, fill #0
  Filled 2023-10-22: qty 30, 30d supply, fill #1

## 2023-09-15 MED ORDER — HEPARIN SOD (PORK) LOCK FLUSH 100 UNIT/ML IV SOLN
500.0000 [IU] | INTRAVENOUS | Status: AC | PRN
  Administered 2023-09-15: 500 [IU]

## 2023-09-15 MED ORDER — NYSTATIN 100000 UNIT/ML MT SUSP
5.0000 mL | Freq: Four times a day (QID) | OROMUCOSAL | 0 refills | Status: AC
  Filled 2023-09-15: qty 60, 3d supply, fill #0

## 2023-09-15 MED ORDER — AMOXICILLIN-POT CLAVULANATE 875-125 MG PO TABS
1.0000 | ORAL_TABLET | Freq: Two times a day (BID) | ORAL | 0 refills | Status: AC
  Filled 2023-09-15: qty 2, 1d supply, fill #0

## 2023-09-15 MED ORDER — K PHOS MONO-SOD PHOS DI & MONO 155-852-130 MG PO TABS
500.0000 mg | ORAL_TABLET | Freq: Four times a day (QID) | ORAL | Status: DC
  Filled 2023-09-15 (×3): qty 2

## 2023-09-15 NOTE — Plan of Care (Signed)

## 2023-09-15 NOTE — Discharge Summary (Addendum)
 Physician Discharge Summary  Kristy Salazar YQM:[id number] DOB: 04-19-1941 DOA: 09/08/2023  PCP: [name], MD  Admit date: 09/08/2023 Discharge date: 09/15/2023  Time spent: 40 minutes  Recommendations for Outpatient Follow-up:  Follow outpatient CBC/CMP  Follow symptoms of dysphagia/odynophagia outpatient - consider repeat EGD with balloon dilation in 4 weeks based on symptoms 1.3 cm subpleural nodule, warrants oncology follow up Follow weight and PO intake outpatient  Consider involvement of palliative in the future   Discharge Diagnoses:  Principal Problem:   Radiation esophagitis Active Problems:   COPD (chronic obstructive pulmonary disease) (HCC)   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 She was admitted for dysphagia/odynophagia and concern for esophagitis (possibly radiation induced). Now s/p EGD with benign appearing stenosis.   She's being discharged with improved PO intake.  Discharged on soft diet.  Continue sucralfate and PPI.  Needs to follow with GI to consider repeat EGD with balloon dilatation based on symptoms.    See below for additional details  Hospital Course:  Assessment and Plan:  Severe dysphagia and odynophagia  Esophageal Stenosis  CT chest with contrast showing findings consistent with esophagitis, possibly radiation-induced given history GI on board, s/p EGD on 09/13/2023 showed  benign-appearing, radiation induced, esophageal stenosis, high risk of perforation, so no dilatation was done, may ultimately need repeat EGD with balloon dilatation based on symptom improvement in 4 weeks. SLP consulted, no further recs likely esophageal  Discussed with GI, ok with advancing to soft diet -> maintain until GI follow up Discharge on sucralfate and daily PPI    Possible aspiration pneumonia Currently afebrile, no leukocytosis CT showing patchy groundglass opacities with bronchial wall thickening in the right middle and lower lobe suspicious for possible aspiration pneumonia Covid neg, resp viral panel neg Complete 7 days abx, will d/c with augmentin Aspiration precautions   Bacillus Species Positive in Blood Culture Suspect this is Greg Cratty contaminant in only 1 anaerobic bottle Afebrile without leukocytosis  Hypokalemia/hypomagnesemia Replace as needed   QT prolongation Avoid QT prolonging drugs if possible Repeat EKG intermittently - improved on discharge   Oropharyngeal candidiasis D/c with nystatin   Stage IIIb non-small cell lung cancer/squamous cell carcinoma Diagnosed in September 2024 status post concurrent chemoradiation and currently on immunotherapy Outpatient oncology follow-up. Has 1.3 cm subpleural nodule in posterior L upper lobe that should be followed with oncology  COPD/asthma Stable, no wheezing.  Continue home inhalers.   Type 2 diabetes with hypoglycemia A1c 5.3 No indication for therapy at this time   Hypertension Currently normotensive.  Not on antihypertensives at home.   Underweight Severe Protein Calorie Malnutrition Likely 2/2 poor oral intake with underlying malignancy Dietitian consulted Body mass index is 12.98 kg/m.   Goals of care discussion Continue discussions with oncology outpatient, consider palliative involvement as indicated    Procedures: EGD   Consultations: GI  Discharge Exam: Vitals:   09/15/23 1001  09/15/23 1010  BP:  Pulse:    Resp:    Temp:    SpO2: 97% 97%   Eager to discharge Daughter at bedside  General: No acute distress. Lungs: unlabored Neurological: Alert and oriented 3. Moves all extremities 4 with equal strength. Cranial nerves II through XII grossly intact. Extremities: No clubbing or cyanosis. No edema.   Discharge Instructions   Discharge Instructions     Call MD for:  difficulty breathing, headache or visual disturbances   Complete by: As directed    Call MD for:  extreme fatigue   Complete by: As directed    Call MD for:  hives   Complete by: As directed    Call MD for:  persistant dizziness or light-headedness   Complete by: As directed    Call MD for:  persistant nausea and vomiting   Complete by: As directed    Call MD for:  redness, tenderness, or signs of infection (pain, swelling, redness, odor or green/yellow discharge around incision site)   Complete by: As directed    Call MD for:  severe uncontrolled pain   Complete by: As directed    Call MD for:  temperature >100.4   Complete by: As directed    Diet - low sodium heart healthy   Complete by: As directed    Discharge instructions   Complete by: As directed    You were seen for difficulty swallowing.    You've had an EGD which showed [name] benign appearing stenosis (narrowing).  Gastroenterology recommends follow up with them in about 4 weeks to consider [name] repeat EGD to reevaluate the need for dilation.  You should continue [name] soft diet until follow up with gastroenterology outpatient.  Continue your sucralfate as prescribed.  We'll discharge you with protonix to take once daily.  You'll finish [name] course of nystatin for thrush.  You've been treated for aspiration pneumonia with antibiotics.  You'll have an additional 1 day of antibiotics to take at home.  Follow your imaging with oncology or your PCP outpatient.  You had [name] left sided subpleural nodule that should be followed with  oncology.  Follow up with your outpatient PCP within 1 week of discharge.  Follow up with your oncologist outpatient.   Return for new, recurrent, or worsening symptoms.  Please ask your PCP to request records from this hospitalization so they know what was done and what the next steps will be.   Increase activity slowly   Complete by: As directed       Allergies as of 09/15/2023       Reactions   Erythromycin Hives        Medication List     STOP taking these medications    potassium chloride SA 20 MEQ tablet Commonly known as: KLOR-CON M       TAKE these medications    albuterol 108 (90 Base) MCG/ACT inhaler Commonly known as: VENTOLIN HFA Inhale 2 puffs into the lungs every 6 (six) hours as needed for wheezing or shortness of breath.   amoxicillin-clavulanate 875-125 MG tablet Commonly known as: AUGMENTIN Take 1 tablet by mouth 2 (two) times daily for 1 day.   Farxiga 10 MG Tabs tablet Generic drug: dapagliflozin propanediol Take 10 mg by mouth daily.   Fluticasone-Umeclidin-Vilant 100-62.5-25 MCG/INH Aepb Inhale 2 application  into the lungs daily as needed.   HYDROcodone-acetaminophen 7.5-325 mg/15 ml solution Commonly known as: HYCET Take 10 mLs by mouth 4 (four) times daily as needed for moderate pain (pain  score 4-6).   megestrol 40 MG/ML suspension Commonly known as: MEGACE Take 40 mg by mouth daily.   mirtazapine 7.5 MG tablet Commonly known as: REMERON Take 7.5 mg by mouth at bedtime.   montelukast 10 MG tablet Commonly known as: SINGULAIR Take 10 mg by mouth daily. For seasonal allergies   multivitamin with minerals Tabs tablet Take 1 tablet by mouth daily.   nystatin 100000 UNIT/ML suspension Commonly known as: MYCOSTATIN Take 5 mLs (500,000 Units total) by mouth 4 (four) times daily for 3 days.   pantoprazole 40 MG tablet Commonly known as: Protonix Take 1 tablet (40 mg total) by mouth daily. Follow up with gastroenterology for  additional instructions   prochlorperazine 10 MG tablet Commonly known as: COMPAZINE TAKE 1 TABLET BY MOUTH EVERY 6 HOURS AS NEEDED FOR NAUSEA FOR VOMITING   rosuvastatin 10 MG tablet Commonly known as: CRESTOR Take 10 mg by mouth daily.   sucralfate 1 g tablet Commonly known as: Carafate Take 1 tablet (1 g total) by mouth 4 (four) times daily -  with meals and at bedtime. Crush 1 tablet in 1 oz water and drink 5 min before meals for radiation induced esophagitis   tiotropium 18 MCG inhalation capsule Commonly known as: SPIRIVA Place 18 mcg into inhaler and inhale daily.       Allergies  Allergen Reactions   Erythromycin Hives    Follow-up Information     [name], MD. Call in 1 week(s).   Specialty: Family Medicine Contact information: [address] [place], [address] [place] [address] [phone]         Lynann Bologna, DO Follow up.   Specialty: Gastroenterology Why: call for follow up Contact information: [address] [place] [address] [phone]                  The results of significant diagnostics from this hospitalization (including imaging, microbiology, ancillary and laboratory) are listed below for reference.    Significant Diagnostic Studies: CT Chest W Contrast Result Date: 09/09/2023 CLINICAL DATA:  Question esophageal pathology, mass, compression. Difficulty swallowing. Chest pain. Lung cancer patient finished radiation treatment last week. EXAM: CT CHEST WITH CONTRAST TECHNIQUE: Multidetector CT imaging of the chest was performed during intravenous contrast administration. RADIATION DOSE REDUCTION: This exam was performed according to the departmental dose-optimization program which includes automated exposure control, adjustment of the mA and/or kV according to patient size and/or use of iterative reconstruction technique. CONTRAST:  75mL OMNIPAQUE IOHEXOL 300 MG/ML  SOLN COMPARISON:  Chest radiograph 09/08/2023 and CT  chest 06/14/2023 FINDINGS: Cardiovascular: Coronary artery and aortic atherosclerotic calcification. No pericardial effusion. Right chest wall Port-Travon Crochet-Cath tip in the low SVC. Mediastinum/Nodes: Fluid and debris filling the esophagus reaching the thoracic inlet. Mild wall thickening and mucosal hyperenhancement in the mid and upper esophagus. Edema within the mediastinum about the mid and upper esophagus. No pneumomediastinum. Trachea is unremarkable. No thoracic adenopathy. Lungs/Pleura: Slightly decreased size of the spiculated mass in the right lower lobe measuring 3.2 x 2.4 cm, previously 3.5 x 2.3 cm. Right lower lobe scarring and architectural distortion. Patchy ground-glass opacities with bronchial wall thickening the right middle and lower lobe. Advanced emphysema. Biapical pleural-parenchymal scarring. Unchanged 1.3 cm subpleural nodule in the posterior left upper lobe. No pleural effusion or pneumothorax. Upper Abdomen: No acute abnormality. Musculoskeletal: No acute fracture or destructive osseous lesion. IMPRESSION: 1. Findings are consistent with esophagitis, possibly radiation induced given history. No pneumomediastinum. 2. Fluid and  debris filling the esophagus reaching the thoracic inlet. 3. Patchy ground-glass opacities with bronchial wall thickening in the right middle and lower lobe, likely infectious or inflammatory and possibly due to aspiration given fluid column in the esophagus. 4. Slightly decreased size of the spiculated mass in the right lower lobe. 5. Unchanged 1.3 cm subpleural nodule in the posterior left upper lobe. Aortic Atherosclerosis (ICD10-I70.0) and Emphysema (ICD10-J43.9). Electronically Signed   By: Minerva Fester M.D.   On: 09/09/2023 02:39   DG Chest Portable 1 View Result Date: 09/08/2023 CLINICAL DATA:  Lung cancer, vomiting, sternal pain EXAM: PORTABLE CHEST 1 VIEW COMPARISON:  CT chest 09/12/2023 and radiographs 05/17/2023 FINDINGS: Right chest wall Port-Anishka Bushard-Cath tip in  the low SVC. Stable cardiomediastinal silhouette. Aortic atherosclerotic calcification. Grossly similar size of the irregular airspace opacity in the right lower lung compared to CT 08/15/2023. Question 1.4 cm nodule in the left midlung. Hyperinflation and chronic bronchitic change. Bibasilar atelectasis/scarring. No pleural effusion or pneumothorax. No displaced rib fractures. IMPRESSION: 1. Grossly similar size of the irregular airspace opacity in the right lower lung compared to CT 08/15/2023. 2. Question 1.4 cm nodule in the left midlung. This could be projectional artifact or due to Arville Postlewaite subpleural nodule seen on 08/15/2023. Continued CT follow-up is recommended. 3. Advanced emphysema. Electronically Signed   By: Minerva Fester M.D.   On: 09/08/2023 22:24    Microbiology: Recent Results (from the past 240 hours)  SARS Coronavirus 2 by RT PCR (hospital order, performed in [facility] hospital lab) *cepheid single result test* Nasopharyngeal Swab     Status: None   Collection Time: 09/09/23  6:57 AM   Specimen: Nasopharyngeal Swab; Nasal Swab  Result Value Ref Range Status   SARS Coronavirus 2 by RT PCR NEGATIVE NEGATIVE Final    Comment: (NOTE) SARS-CoV-2 target nucleic acids are NOT DETECTED.  The SARS-CoV-2 RNA is generally detectable in upper and lower respiratory specimens during the acute phase of infection. The lowest concentration of SARS-CoV-2 viral copies this assay can detect is 250 copies / mL. Kendarrius Tanzi negative result does not preclude SARS-CoV-2 infection and should not be used as the sole basis for treatment or other patient management decisions.  Erilyn Pearman negative result may occur with improper specimen collection / handling, submission of specimen other than nasopharyngeal swab, presence of viral mutation(s) within the areas targeted by this assay, and inadequate number of viral copies (<250 copies / mL). Xabi Wittler negative result must be combined with clinical observations, patient history, and  epidemiological information.  Fact Sheet for Patients:   RoadLapTop.co.za  Fact Sheet for Healthcare Providers: http://kim-miller.com/  This test is not yet approved or  cleared by the [place] FDA and has been authorized for detection and/or diagnosis of SARS-CoV-2 by FDA under an Emergency Use Authorization (EUA).  This EUA will remain in effect (meaning this test can be used) for the duration of the COVID-19 declaration under Section 564(b)(1) of the Act, 21 U.S.C. section 360bbb-3(b)(1), unless the authorization is terminated or revoked sooner.  Performed at [facility], [address], [place], [place] [address]   Respiratory (~20 pathogens) panel by PCR     Status: None   Collection Time: 09/09/23  6:57 AM   Specimen: Nasopharyngeal Swab; Respiratory  Result Value Ref Range Status   Adenovirus NOT DETECTED NOT DETECTED Final   Coronavirus 229E NOT DETECTED NOT DETECTED Final    Comment: (NOTE) The Coronavirus on the Respiratory Panel, DOES NOT test for the novel  Coronavirus (2019 nCoV)    Coronavirus HKU1 NOT DETECTED NOT DETECTED Final   Coronavirus NL63 NOT DETECTED NOT DETECTED Final   Coronavirus OC43 NOT DETECTED NOT DETECTED Final   Metapneumovirus NOT DETECTED NOT DETECTED Final   Rhinovirus / Enterovirus NOT DETECTED NOT DETECTED Final   Influenza Wilda Wetherell NOT DETECTED NOT DETECTED Final   Influenza B NOT DETECTED NOT DETECTED Final   Parainfluenza Virus 1 NOT DETECTED NOT DETECTED Final   Parainfluenza Virus 2 NOT DETECTED NOT DETECTED Final   Parainfluenza Virus 3 NOT DETECTED NOT DETECTED Final   Parainfluenza Virus 4 NOT DETECTED NOT DETECTED Final   Respiratory Syncytial Virus NOT DETECTED NOT DETECTED Final   Bordetella pertussis NOT DETECTED NOT DETECTED Final   Bordetella Parapertussis NOT DETECTED NOT DETECTED Final   Chlamydophila pneumoniae NOT DETECTED NOT DETECTED Final   Mycoplasma  pneumoniae NOT DETECTED NOT DETECTED Final    Comment: Performed at [facility], [address], [place], [place] [address]  Culture, blood (Routine X 2) w Reflex to ID Panel     Status: Abnormal   Collection Time: 09/09/23  7:48 AM   Specimen: BLOOD  Result Value Ref Range Status   Specimen Description   Final    BLOOD SITE NOT SPECIFIED Performed at [facility], [address], [place], [place] [address]    Special Requests   Final    BOTTLES DRAWN AEROBIC AND ANAEROBIC Blood Culture adequate volume Performed at [facility], [address], [place], [place] [address]    Culture  Setup Time   Final    GRAM POSITIVE RODS ANAEROBIC BOTTLE ONLY CRITICAL RESULT CALLED TO, READ BACK BY AND VERIFIED WITH: PHARMD Damaris Hippo [id number] @ 2308 FH    Culture (Jathniel Smeltzer)  Final    BACILLUS SPECIES Standardized susceptibility testing for this organism is not available. Performed at [facility], [address], [place], [place] [address]    Report Status 09/14/2023 FINAL  Final  Culture, blood (Routine X 2) w Reflex to ID Panel     Status: None   Collection Time: 09/09/23  3:40 PM   Specimen: BLOOD RIGHT ARM  Result Value Ref Range Status   Specimen Description   Final    BLOOD RIGHT ARM Performed at [facility], [address] [place], [place] [address]    Special Requests   Final    BOTTLES DRAWN AEROBIC AND ANAEROBIC Blood Culture results may not be optimal due to an inadequate volume of blood received in culture bottles Performed at [facility], [address] [place], [place] [address]    Culture   Final    NO GROWTH 5 DAYS Performed at [facility], [address], [place], [place] [address]    Report Status 09/14/2023 FINAL  Final     Labs: Basic Metabolic Panel: Recent Labs  Lab 09/10/23 0505 09/11/23 0241 09/11/23 0939 09/12/23 0558 09/12/23 1230 09/13/23 0400 09/14/23 0242 09/15/23 0431   NA 140 143  --  138  --  135 136 136  K 3.3* 2.5*  --  2.8* 3.1* 3.4* 3.3* 4.1  CL 106 108  --  107  --  104 106 109  CO2 28 25  --  24  --  24 23 21*  GLUCOSE 85 85  --  117*  --  109* 121* 111*  BUN 7* 6*  --  6*  --  <5* <  5* <5*  CREATININE 0.52 0.56  --  0.55  --  0.50 0.51 0.57  CALCIUM 7.7* 7.9*  --  7.6*  --  7.6* 7.7* 8.3*  MG 1.7  --  1.7  --   --  1.6* 2.2 1.9  PHOS 2.0*  --   --   --   --   --   --  1.7*   Liver Function Tests: Recent Labs  Lab 09/08/23 1954 09/15/23 0431  AST 36 33  ALT 26 22  ALKPHOS 99 106  BILITOT 0.8 0.4  PROT 7.7 6.3*  ALBUMIN 2.7* 2.1*   Recent Labs  Lab 09/08/23 1954  LIPASE 21   No results for input(s): "AMMONIA" in the last 168 hours. CBC: Recent Labs  Lab 09/08/23 1954 09/10/23 0505 09/11/23 0241 09/12/23 0558 09/14/23 0242 09/15/23 0431  WBC 4.1 3.4* 4.5 3.0* 3.4* 5.0  NEUTROABS 2.3 2.0 3.0 1.8  --  3.5  HGB 12.5 10.0* 10.3* 9.9* 10.2* 10.8*  HCT 40.3 32.4* 33.4* 32.6* 33.1* 35.6*  MCV 95.7 95.3 97.7 97.9 95.7 97.3  PLT 332 264 260 254 315 307   Cardiac Enzymes: No results for input(s): "CKTOTAL", "CKMB", "CKMBINDEX", "TROPONINI" in the last 168 hours. BNP: BNP (last 3 results) No results for input(s): "BNP" in the last 8760 hours.  ProBNP (last 3 results) No results for input(s): "PROBNP" in the last 8760 hours.  CBG: Recent Labs  Lab 09/14/23 1734 09/14/23 2041 09/14/23 2359 09/15/23 0400 09/15/23 0803  GLUCAP 110* 116* 95 96 94       Signed:  Lacretia Nicks MD.  [facility] 09/15/2023, 10:43 AM

## 2023-09-15 NOTE — Progress Notes (Signed)
 Medications delivered from [facility] outpatient pharmacy by this RN

## 2023-09-17 DIAGNOSIS — E870 Hyperosmolality and hypernatremia: Secondary | ICD-10-CM | POA: Diagnosis not present

## 2023-09-17 DIAGNOSIS — C3490 Malignant neoplasm of unspecified part of unspecified bronchus or lung: Secondary | ICD-10-CM | POA: Diagnosis not present

## 2023-09-17 DIAGNOSIS — R634 Abnormal weight loss: Secondary | ICD-10-CM | POA: Diagnosis not present

## 2023-09-17 DIAGNOSIS — E876 Hypokalemia: Secondary | ICD-10-CM | POA: Diagnosis not present

## 2023-09-17 DIAGNOSIS — J449 Chronic obstructive pulmonary disease, unspecified: Secondary | ICD-10-CM | POA: Diagnosis not present

## 2023-09-17 DIAGNOSIS — K209 Esophagitis, unspecified without bleeding: Secondary | ICD-10-CM | POA: Diagnosis not present

## 2023-09-20 NOTE — Progress Notes (Signed)
 [facility] OFFICE PROGRESS NOTE  Kristy Rakers, MD [address] [place] [address]  DIAGNOSIS:  Stage IIIb (T4, N2, M0) non-small cell lung cancer, squamous cell carcinoma presented with large right lower lobe lung mass in addition to other right upper lobe pulmonary nodule and right hilar and mediastinal lymphadenopathy in September 2024.   PRIOR THERAPY: A course of concurrent chemoradiation with weekly carboplatin for AUC of 2 and paclitaxel 45 Mg/M2. First dose June 06, 2023. Status post 6 cycles.   CURRENT THERAPY: Consolidation with Imfinzi 1500 mg IV every 4 weeks. First dose expected on 08/29/2023. Status post 1 cycle of treatment.   INTERVAL HISTORY: Kristy Salazar 83 y.o. female returns to the clinic today for a follow-up visit accompanied by her daughter.  The patient was last seen in the clinic, 08/22/23.  The patient had completed her course of concurrent chemoradiation.  She was still struggling with some odynophagia and dysphagia for which she used Hycet and Carafate.  She was also started on appetite stimulant with what sounds like is Megace.  She was drinking Ensure.  She received IV fluids.  She was referred to GI but before she can be evaluated, she presented to the emergency room on 09/08/2023 due to failure to thrive and decreased oral intake.  She underwent upper endoscopy which showed radiation esophagitis and esophageal stenosis.  She was noted to be high risk for perforation so dilation was not performed so they recommended repeat EGD with balloon dilation based on symptom improvement in 4 weeks.  They recommended advancing to soft diet. She does not have a follow up with GI outpatient yet. She is on a PPI with and carafate. She was also treated with antibitoics for possible aspiration pneumonia vs infection.   The patient also had underwent her first dose of consolidation immunotherapy with Imfinzi which she tolerated well without any adverse side  effects.  She denies any fever, chills, or night sweats.  Does report cold intolerance secondary to the weight loss.  She is scheduled to see a nutritionist while in the infusion room today.  She denies any vomiting.  She denies any significant shortness of breath unless with exerting herself.  She denies any significant cough.  Denies any hemoptysis or chest pain. She does struggle with frequent constipation although she reports that she has been eating better she has not been having as much constipation.  She will take stool softeners and laxatives if needed.  Denies any headache or visual changes.  Denies any rashes or skin changes.  She is here today for evaluation and repeat blood work before undergoing cycle #2.  MEDICAL HISTORY: Past Medical History:  Diagnosis Date   Arthritis    Asthma    COPD (chronic obstructive pulmonary disease) (HCC)    Diabetes mellitus    patient denies   Hypertension    Seasonal allergies     ALLERGIES:  is allergic to erythromycin.  MEDICATIONS:  Current Outpatient Medications  Medication Sig Dispense Refill   potassium chloride 20 MEQ/15ML (10%) SOLN Take 15 mLs (20 mEq total) by mouth 2 (two) times daily. 270 mL 0   albuterol (VENTOLIN HFA) 108 (90 Base) MCG/ACT inhaler Inhale 2 puffs into the lungs every 6 (six) hours as needed for wheezing or shortness of breath. 8 g 2   FARXIGA 10 MG TABS tablet Take 10 mg by mouth daily.     Fluticasone-Umeclidin-Vilant 100-62.5-25 MCG/INH AEPB Inhale 2 application  into  the lungs daily as needed.     HYDROcodone-acetaminophen (HYCET) 7.5-325 mg/15 ml solution Take 10 mLs by mouth 4 (four) times daily as needed for moderate pain (pain score 4-6). 120 mL 0   megestrol (MEGACE) 40 MG/ML suspension Take 40 mg by mouth daily.     mirtazapine (REMERON) 7.5 MG tablet Take 7.5 mg by mouth at bedtime.     montelukast (SINGULAIR) 10 MG tablet Take 10 mg by mouth daily. For seasonal allergies     Multiple Vitamin  (MULTIVITAMIN WITH MINERALS) TABS tablet Take 1 tablet by mouth daily.     pantoprazole (PROTONIX) 40 MG tablet Take 1 tablet (40 mg total) by mouth daily. Follow up with gastroenterology for additional instructions 30 tablet 1   prochlorperazine (COMPAZINE) 10 MG tablet TAKE 1 TABLET BY MOUTH EVERY 6 HOURS AS NEEDED FOR NAUSEA FOR VOMITING 30 tablet 0   rosuvastatin (CRESTOR) 10 MG tablet Take 10 mg by mouth daily.       sucralfate (CARAFATE) 1 g tablet Take 1 tablet (1 g total) by mouth 4 (four) times daily -  with meals and at bedtime. Crush 1 tablet in 1 oz water and drink 5 min before meals for radiation induced esophagitis 120 tablet 2   tiotropium (SPIRIVA) 18 MCG inhalation capsule Place 18 mcg into inhaler and inhale daily.     No current facility-administered medications for this visit.   Facility-Administered Medications Ordered in Other Visits  Medication Dose Route Frequency Provider Last Rate Last Admin   0.9 %  sodium chloride infusion   Intravenous Continuous Si Gaul, MD       durvalumab (IMFINZI) 1,500 mg in sodium chloride 0.9 % 100 mL chemo infusion  1,500 mg Intravenous Once Si Gaul, MD       heparin lock flush 100 unit/mL  500 Units Intracatheter Once PRN Si Gaul, MD       sodium chloride flush (NS) 0.9 % injection 10 mL  10 mL Intracatheter PRN Si Gaul, MD        SURGICAL HISTORY:  Past Surgical History:  Procedure Laterality Date   ABDOMINAL HYSTERECTOMY     APPENDECTOMY     BACK SURGERY     BRONCHIAL BIOPSY  05/17/2023   Procedure: BRONCHIAL BIOPSIES;  Surgeon: [name], MD;  Location: [facility] ENDOSCOPY;  Service: Pulmonary;;   BRONCHIAL BRUSHINGS  05/17/2023   Procedure: BRONCHIAL BRUSHINGS;  Surgeon: [name], MD;  Location: [facility] ENDOSCOPY;  Service: Pulmonary;;   BRONCHIAL NEEDLE ASPIRATION BIOPSY  05/17/2023   Procedure: BRONCHIAL NEEDLE ASPIRATION BIOPSIES;  Surgeon: [name], MD;  Location: [facility] ENDOSCOPY;   Service: Pulmonary;;   ESOPHAGOGASTRODUODENOSCOPY (EGD) WITH PROPOFOL N/A 09/13/2023   Procedure: ESOPHAGOGASTRODUODENOSCOPY (EGD) WITH PROPOFOL;  Surgeon: Lynann Bologna, DO;  Location: [facility] ENDOSCOPY;  Service: Gastroenterology;  Laterality: N/A;   IR IMAGING GUIDED PORT INSERTION  06/03/2023   JOINT REPLACEMENT     TOTAL HIP ARTHROPLASTY     TOTAL KNEE ARTHROPLASTY     VIDEO BRONCHOSCOPY WITH ENDOBRONCHIAL ULTRASOUND Right 05/17/2023   Procedure: VIDEO BRONCHOSCOPY WITH ENDOBRONCHIAL ULTRASOUND;  Surgeon: [name], MD;  Location: [facility] ENDOSCOPY;  Service: Pulmonary;  Laterality: Right;    REVIEW OF SYSTEMS:   Constitutional: Positive for appetite change (improving compared to prior). Negative for chills and fever.  HENT: Negative for mouth sores, nosebleeds, sore throat and trouble swallowing.   Eyes: Negative for eye problems and icterus.  Respiratory: Positive for intermittent mild cough and  minimal dyspnea on exertion. Negative for  hemoptysis and wheezing.   Cardiovascular: Negative for chest pain and leg swelling.  Gastrointestinal: Negative for abdominal pain, constipation (baseline-manages with laxatives and stool softener), diarrhea, nausea and vomiting.  Genitourinary: Negative for bladder incontinence, difficulty urinating, dysuria, frequency and hematuria.   Musculoskeletal: Negative for back pain, gait problem, neck pain and neck stiffness.  Skin: Negative for itching and rash.  Neurological: Negative for dizziness, extremity weakness, gait problem, headaches, and seizures.  Hematological: Negative for adenopathy. Does not bruise/bleed easily.  Psychiatric/Behavioral: Negative for confusion, depression and sleep disturbance. The patient is not nervous/anxious.    PHYSICAL EXAMINATION:  Blood pressure 110/78, pulse 89, temperature (!) 97.3 F (36.3 C), temperature source Temporal, resp. rate 16, weight 86 lb 11.2 oz (39.3 kg), SpO2 100%.  ECOG PERFORMANCE STATUS:  2  Physical Exam  Constitutional: Oriented to person, place, and time and thin appearing female, and in no distress.  HENT:  Head: Normocephalic and atraumatic.  Mouth/Throat: Oropharynx is clear and moist. No oropharyngeal exudate.  Eyes: Conjunctivae are normal. Right eye exhibits no discharge. Left eye exhibits no discharge. No scleral icterus.  Neck: Normal range of motion. Neck supple.  Cardiovascular: Normal rate, regular rhythm, normal heart sounds and intact distal pulses.   Pulmonary/Chest: Effort normal. Quiet breath sounds bilaterally. No respiratory distress. No wheezes. No rales.  Abdominal: Soft. Bowel sounds are normal. Exhibits no distension and no mass. There is no tenderness.  Musculoskeletal: Normal range of motion. Exhibits no edema.  Lymphadenopathy:    No cervical adenopathy.  Neurological: Alert and oriented to person, place, and time. Exhibits muscle wasting. Examined in the wheelchair.  Skin: Skin is warm and dry. No rash noted. Not diaphoretic. No erythema. No pallor.  Psychiatric: Mood, memory and judgment normal.  Vitals reviewed.  LABORATORY DATA: Lab Results  Component Value Date   WBC 3.8 (L) 09/27/2023   HGB 11.3 (L) 09/27/2023   HCT 34.5 (L) 09/27/2023   MCV 94.3 09/27/2023   PLT 397 09/27/2023      Chemistry      Component Value Date/Time   NA 140 09/27/2023 0750   K 2.9 (L) 09/27/2023 0750   CL 112 (H) 09/27/2023 0750   CO2 22 09/27/2023 0750   BUN 11 09/27/2023 0750   CREATININE 0.54 09/27/2023 0750      Component Value Date/Time   CALCIUM 9.6 09/27/2023 0750   ALKPHOS 71 09/27/2023 0750   AST 24 09/27/2023 0750   ALT 25 09/27/2023 0750   BILITOT 0.3 09/27/2023 0750       RADIOGRAPHIC STUDIES:  CT Chest W Contrast Result Date: 09/09/2023 CLINICAL DATA:  Question esophageal pathology, mass, compression. Difficulty swallowing. Chest pain. Lung cancer patient finished radiation treatment last week. EXAM: CT CHEST WITH CONTRAST  TECHNIQUE: Multidetector CT imaging of the chest was performed during intravenous contrast administration. RADIATION DOSE REDUCTION: This exam was performed according to the departmental dose-optimization program which includes automated exposure control, adjustment of the mA and/or kV according to patient size and/or use of iterative reconstruction technique. CONTRAST:  75mL OMNIPAQUE IOHEXOL 300 MG/ML  SOLN COMPARISON:  Chest radiograph 09/08/2023 and CT chest 06/14/2023 FINDINGS: Cardiovascular: Coronary artery and aortic atherosclerotic calcification. No pericardial effusion. Right chest wall Port-A-Cath tip in the low SVC. Mediastinum/Nodes: Fluid and debris filling the esophagus reaching the thoracic inlet. Mild wall thickening and mucosal hyperenhancement in the mid and upper esophagus. Edema within the mediastinum about the mid and upper esophagus. No pneumomediastinum.  Trachea is unremarkable. No thoracic adenopathy. Lungs/Pleura: Slightly decreased size of the spiculated mass in the right lower lobe measuring 3.2 x 2.4 cm, previously 3.5 x 2.3 cm. Right lower lobe scarring and architectural distortion. Patchy ground-glass opacities with bronchial wall thickening the right middle and lower lobe. Advanced emphysema. Biapical pleural-parenchymal scarring. Unchanged 1.3 cm subpleural nodule in the posterior left upper lobe. No pleural effusion or pneumothorax. Upper Abdomen: No acute abnormality. Musculoskeletal: No acute fracture or destructive osseous lesion. IMPRESSION: 1. Findings are consistent with esophagitis, possibly radiation induced given history. No pneumomediastinum. 2. Fluid and debris filling the esophagus reaching the thoracic inlet. 3. Patchy ground-glass opacities with bronchial wall thickening in the right middle and lower lobe, likely infectious or inflammatory and possibly due to aspiration given fluid column in the esophagus. 4. Slightly decreased size of the spiculated mass in the right  lower lobe. 5. Unchanged 1.3 cm subpleural nodule in the posterior left upper lobe. Aortic Atherosclerosis (ICD10-I70.0) and Emphysema (ICD10-J43.9). Electronically Signed   By: Minerva Fester M.D.   On: 09/09/2023 02:39   DG Chest Portable 1 View Result Date: 09/08/2023 CLINICAL DATA:  Lung cancer, vomiting, sternal pain EXAM: PORTABLE CHEST 1 VIEW COMPARISON:  CT chest 09/12/2023 and radiographs 05/17/2023 FINDINGS: Right chest wall Port-A-Cath tip in the low SVC. Stable cardiomediastinal silhouette. Aortic atherosclerotic calcification. Grossly similar size of the irregular airspace opacity in the right lower lung compared to CT 08/15/2023. Question 1.4 cm nodule in the left midlung. Hyperinflation and chronic bronchitic change. Bibasilar atelectasis/scarring. No pleural effusion or pneumothorax. No displaced rib fractures. IMPRESSION: 1. Grossly similar size of the irregular airspace opacity in the right lower lung compared to CT 08/15/2023. 2. Question 1.4 cm nodule in the left midlung. This could be projectional artifact or due to a subpleural nodule seen on 08/15/2023. Continued CT follow-up is recommended. 3. Advanced emphysema. Electronically Signed   By: Minerva Fester M.D.   On: 09/08/2023 22:24     ASSESSMENT/PLAN:  This is a very pleasant 83 year old African-American female with stage IIIb (T4, N2, M0) non-small cell lung cancer, squamous cell carcinoma.  She presented with a large right lower lobe lung mass in addition to other right upper lobe pulmonary nodules and right hilar mediastinal lymphadenopathy.  She was diagnosed in September 2024.     PDL1 expression 1% per navigator.    She completed a course of concurrent chemoradiation with weekly carboplatin for an AUC of 2 paclitaxel 45 mg/m. She started on 06/06/23. She is status post 7 cycles.  Her last day of treatment was on 07/18/2023.  She is currently undergoing treatment with consolidation immunotherapy with Imfinzi 1500 mg IV  every 4 weeks. She is status post 1 cycle. First dose on 08/30/23.  He did not have any appreciable side effects from treatment.    Will see her back for follow-up visit in 4 weeks for evaluation and repeat blood work before undergoing cycle #3.   I have wrote a letter to ask that they extend her short-term disability for at least another 2-3 weeks to allow more recovery time.  She will also contact the [facility] department to extend her disability formally.   It sounds like her PCP started her on an appetite stimulant with megace.  Recommend that she continue this.    I gave her the number to the GI office to help facilitate making her outpatient follow up.   Her potassium is low. We will give 20 meq IV today while  in the infusion room. I also will send her p.o potassium to take 20 meq BID for 8-10 days. Given her esophageal stenosis, I will send her liquid potassium. Will have RN staff give education on the importance of being compliant with outpatient potassium given the risk of arrhythmias from hypokalemia.   The patient was advised to call immediately if she has any concerning symptoms in the interval. The patient voices understanding of current disease status and treatment options and is in agreement with the current care plan. All questions were answered. The patient knows to call the clinic with any problems, questions or concerns. We can certainly see the patient much sooner if necessary    No orders of the defined types were placed in this encounter.   The total time spent in the appointment was 20-29 minutes  Issam Carlyon L Niana Martorana, PA-C 09/27/23

## 2023-09-25 ENCOUNTER — Other Ambulatory Visit: Payer: Self-pay

## 2023-09-27 ENCOUNTER — Inpatient Hospital Stay: Admitting: Dietician

## 2023-09-27 ENCOUNTER — Inpatient Hospital Stay (HOSPITAL_BASED_OUTPATIENT_CLINIC_OR_DEPARTMENT_OTHER): Payer: BC Managed Care – PPO | Admitting: Physician Assistant

## 2023-09-27 ENCOUNTER — Inpatient Hospital Stay: Payer: BC Managed Care – PPO

## 2023-09-27 ENCOUNTER — Inpatient Hospital Stay: Payer: BC Managed Care – PPO | Attending: Internal Medicine

## 2023-09-27 VITALS — BP 110/78 | HR 89 | Temp 97.3°F | Resp 16 | Wt 86.7 lb

## 2023-09-27 VITALS — BP 108/67 | HR 82 | Temp 97.5°F | Resp 20

## 2023-09-27 DIAGNOSIS — E876 Hypokalemia: Secondary | ICD-10-CM | POA: Diagnosis not present

## 2023-09-27 DIAGNOSIS — K222 Esophageal obstruction: Secondary | ICD-10-CM | POA: Diagnosis not present

## 2023-09-27 DIAGNOSIS — C3431 Malignant neoplasm of lower lobe, right bronchus or lung: Secondary | ICD-10-CM | POA: Diagnosis present

## 2023-09-27 DIAGNOSIS — Z5112 Encounter for antineoplastic immunotherapy: Secondary | ICD-10-CM | POA: Insufficient documentation

## 2023-09-27 DIAGNOSIS — Z95828 Presence of other vascular implants and grafts: Secondary | ICD-10-CM

## 2023-09-27 LAB — CMP (CANCER CENTER ONLY)
ALT: 25 U/L (ref 0–44)
AST: 24 U/L (ref 15–41)
Albumin: 3.3 g/dL — ABNORMAL LOW (ref 3.5–5.0)
Alkaline Phosphatase: 71 U/L (ref 38–126)
Anion gap: 6 (ref 5–15)
BUN: 11 mg/dL (ref 8–23)
CO2: 22 mmol/L (ref 22–32)
Calcium: 9.6 mg/dL (ref 8.9–10.3)
Chloride: 112 mmol/L — ABNORMAL HIGH (ref 98–111)
Creatinine: 0.54 mg/dL (ref 0.44–1.00)
GFR, Estimated: >60 mL/min (ref >60)
Glucose, Bld: 138 mg/dL — ABNORMAL HIGH (ref 70–99)
Potassium: 2.9 mmol/L — ABNORMAL LOW (ref 3.5–5.1)
Sodium: 140 mmol/L (ref 135–145)
Total Bilirubin: 0.3 mg/dL (ref 0.0–1.2)
Total Protein: 7.2 g/dL (ref 6.5–8.1)

## 2023-09-27 LAB — CBC WITH DIFFERENTIAL (CANCER CENTER ONLY)
Abs Immature Granulocytes: 0.01 10*3/uL (ref 0.00–0.07)
Basophils Absolute: 0 10*3/uL (ref 0.0–0.1)
Basophils Relative: 1 %
Eosinophils Absolute: 0 10*3/uL (ref 0.0–0.5)
Eosinophils Relative: 1 %
HCT: 34.5 % — ABNORMAL LOW (ref 36.0–46.0)
Hemoglobin: 11.3 g/dL — ABNORMAL LOW (ref 12.0–15.0)
Immature Granulocytes: 0 %
Lymphocytes Relative: 23 %
Lymphs Abs: 0.9 10*3/uL (ref 0.7–4.0)
MCH: 30.9 pg (ref 26.0–34.0)
MCHC: 32.8 g/dL (ref 30.0–36.0)
MCV: 94.3 fL (ref 80.0–100.0)
Monocytes Absolute: 0.3 10*3/uL (ref 0.1–1.0)
Monocytes Relative: 9 %
Neutro Abs: 2.6 10*3/uL (ref 1.7–7.7)
Neutrophils Relative %: 66 %
Platelet Count: 397 10*3/uL (ref 150–400)
RBC: 3.66 MIL/uL — ABNORMAL LOW (ref 3.87–5.11)
RDW: 16.2 % — ABNORMAL HIGH (ref 11.5–15.5)
WBC Count: 3.8 10*3/uL — ABNORMAL LOW (ref 4.0–10.5)
nRBC: 0 % (ref 0.0–0.2)

## 2023-09-27 MED ORDER — HEPARIN SOD (PORK) LOCK FLUSH 100 UNIT/ML IV SOLN
500.0000 [IU] | Freq: Once | INTRAVENOUS | Status: AC | PRN
  Administered 2023-09-27: 500 [IU]

## 2023-09-27 MED ORDER — SODIUM CHLORIDE 0.9% FLUSH
10.0000 mL | Freq: Once | INTRAVENOUS | Status: AC
  Administered 2023-09-27: 10 mL

## 2023-09-27 MED ORDER — POTASSIUM CHLORIDE 10 MEQ/100ML IV SOLN
10.0000 meq | INTRAVENOUS | Status: AC
  Administered 2023-09-27 (×2): 10 meq via INTRAVENOUS
  Filled 2023-09-27 (×2): qty 100

## 2023-09-27 MED ORDER — POTASSIUM CHLORIDE 20 MEQ/15ML (10%) PO SOLN
20.0000 meq | Freq: Two times a day (BID) | ORAL | 0 refills | Status: DC
Start: 2023-09-27 — End: 2024-01-30

## 2023-09-27 MED ORDER — SODIUM CHLORIDE 0.9% FLUSH
10.0000 mL | INTRAVENOUS | Status: DC | PRN
  Administered 2023-09-27: 10 mL

## 2023-09-27 MED ORDER — SODIUM CHLORIDE 0.9 % IV SOLN: INTRAVENOUS | Status: DC

## 2023-09-27 MED ORDER — SODIUM CHLORIDE 0.9 % IV SOLN
1500.0000 mg | Freq: Once | INTRAVENOUS | Status: AC
  Administered 2023-09-27: 1500 mg via INTRAVENOUS
  Filled 2023-09-27: qty 30

## 2023-09-27 NOTE — Patient Instructions (Signed)

## 2023-09-27 NOTE — Progress Notes (Signed)
 Nutrition Follow-up:  Pt with stage III non-small cell lung cancer of right lower lobe. She completed concurrent chemoradiation with weekly carbo/taxol. Final RT 1/14. She is currently receiving consolidation with Imfinzi q4w (first 2/17).   2/27-3/6 admission with radiation esophagitis  Met with pt and daughter in infusion. Pt in good spirits and reports good appetite. Oral intake has significantly improved. Denies odynophagia. She is eating 3-4 small meals + drinking 2-3 Ensure Plus/CIB. Yesterday had eggs, liver pudding and grits for breakfast, chicken pie with cabbage for lunch. Pt had cube steak, potatoes, greens for dinner and pudding cup for dessert. She denies nausea, vomiting, diarrhea, constipation.    Medications: reviewed   Labs: K 2.9, glucose 138, albumin 3.3  Anthropometrics: Wt 86 lb 11.2 oz today - decreased 8% in 3 weeks (severe for time frame - s/p 7d admission)  2/21 - 93 lb 6 oz    NUTRITION DIAGNOSIS: Unintended wt loss continues    MALNUTRITION DIAGNOSIS: Severe malnutrition continues    INTERVENTION:  Continue strategies for increasing calories and protein with small frequent meals Continue 2 Ensure Complete/equivalent in between meals     MONITORING, EVALUATION, GOAL: wt trends, intake   NEXT VISIT: Monday April 14 during infusion with Drema Halon

## 2023-10-06 ENCOUNTER — Other Ambulatory Visit: Payer: Self-pay

## 2023-10-21 ENCOUNTER — Encounter: Payer: Self-pay | Admitting: Internal Medicine

## 2023-10-21 NOTE — Telephone Encounter (Signed)
 Open error

## 2023-10-22 ENCOUNTER — Other Ambulatory Visit: Payer: Self-pay

## 2023-10-24 ENCOUNTER — Inpatient Hospital Stay: Payer: BC Managed Care – PPO

## 2023-10-24 ENCOUNTER — Ambulatory Visit

## 2023-10-24 ENCOUNTER — Inpatient Hospital Stay (HOSPITAL_BASED_OUTPATIENT_CLINIC_OR_DEPARTMENT_OTHER): Payer: BC Managed Care – PPO | Admitting: Internal Medicine

## 2023-10-24 ENCOUNTER — Inpatient Hospital Stay: Payer: BC Managed Care – PPO | Attending: Internal Medicine

## 2023-10-24 VITALS — BP 106/95 | HR 96 | Temp 98.3°F | Resp 17 | Ht 71.0 in | Wt 93.3 lb

## 2023-10-24 DIAGNOSIS — C3431 Malignant neoplasm of lower lobe, right bronchus or lung: Secondary | ICD-10-CM

## 2023-10-24 DIAGNOSIS — C3490 Malignant neoplasm of unspecified part of unspecified bronchus or lung: Secondary | ICD-10-CM | POA: Diagnosis not present

## 2023-10-24 DIAGNOSIS — Z5112 Encounter for antineoplastic immunotherapy: Secondary | ICD-10-CM | POA: Insufficient documentation

## 2023-10-24 DIAGNOSIS — C3481 Malignant neoplasm of overlapping sites of right bronchus and lung: Secondary | ICD-10-CM | POA: Diagnosis not present

## 2023-10-24 DIAGNOSIS — Z95828 Presence of other vascular implants and grafts: Secondary | ICD-10-CM

## 2023-10-24 LAB — CBC WITH DIFFERENTIAL (CANCER CENTER ONLY)
Abs Immature Granulocytes: 0.04 10*3/uL (ref 0.00–0.07)
Basophils Absolute: 0 10*3/uL (ref 0.0–0.1)
Basophils Relative: 0 %
Eosinophils Absolute: 0 10*3/uL (ref 0.0–0.5)
Eosinophils Relative: 0 %
HCT: 40.3 % (ref 36.0–46.0)
Hemoglobin: 13.5 g/dL (ref 12.0–15.0)
Immature Granulocytes: 1 %
Lymphocytes Relative: 19 %
Lymphs Abs: 0.9 10*3/uL (ref 0.7–4.0)
MCH: 31 pg (ref 26.0–34.0)
MCHC: 33.5 g/dL (ref 30.0–36.0)
MCV: 92.6 fL (ref 80.0–100.0)
Monocytes Absolute: 0.4 10*3/uL (ref 0.1–1.0)
Monocytes Relative: 9 %
Neutro Abs: 3.5 10*3/uL (ref 1.7–7.7)
Neutrophils Relative %: 71 %
Platelet Count: 261 10*3/uL (ref 150–400)
RBC: 4.35 MIL/uL (ref 3.87–5.11)
RDW: 15.1 % (ref 11.5–15.5)
WBC Count: 4.9 10*3/uL (ref 4.0–10.5)
nRBC: 0 % (ref 0.0–0.2)

## 2023-10-24 LAB — CMP (CANCER CENTER ONLY)
ALT: 25 U/L (ref 0–44)
AST: 21 U/L (ref 15–41)
Albumin: 3.8 g/dL (ref 3.5–5.0)
Alkaline Phosphatase: 49 U/L (ref 38–126)
Anion gap: 6 (ref 5–15)
BUN: 15 mg/dL (ref 8–23)
CO2: 24 mmol/L (ref 22–32)
Calcium: 9.5 mg/dL (ref 8.9–10.3)
Chloride: 106 mmol/L (ref 98–111)
Creatinine: 0.62 mg/dL (ref 0.44–1.00)
GFR, Estimated: >60 mL/min (ref >60)
Glucose, Bld: 119 mg/dL — ABNORMAL HIGH (ref 70–99)
Potassium: 3.4 mmol/L — ABNORMAL LOW (ref 3.5–5.1)
Sodium: 136 mmol/L (ref 135–145)
Total Bilirubin: 0.3 mg/dL (ref 0.0–1.2)
Total Protein: 7.4 g/dL (ref 6.5–8.1)

## 2023-10-24 LAB — TSH: TSH: 1.51 u[IU]/mL (ref 0.350–4.500)

## 2023-10-24 MED ORDER — SODIUM CHLORIDE 0.9% FLUSH
10.0000 mL | INTRAVENOUS | Status: DC | PRN
  Administered 2023-10-24: 10 mL

## 2023-10-24 MED ORDER — HEPARIN SOD (PORK) LOCK FLUSH 100 UNIT/ML IV SOLN
500.0000 [IU] | Freq: Once | INTRAVENOUS | Status: AC | PRN
  Administered 2023-10-24: 500 [IU]

## 2023-10-24 MED ORDER — SODIUM CHLORIDE 0.9 % IV SOLN
1500.0000 mg | Freq: Once | INTRAVENOUS | Status: AC
  Administered 2023-10-24: 1500 mg via INTRAVENOUS
  Filled 2023-10-24: qty 30

## 2023-10-24 MED ORDER — SODIUM CHLORIDE 0.9 % IV SOLN: INTRAVENOUS | Status: DC

## 2023-10-24 MED ORDER — SODIUM CHLORIDE 0.9% FLUSH
10.0000 mL | Freq: Once | INTRAVENOUS | Status: AC
  Administered 2023-10-24: 10 mL

## 2023-10-24 NOTE — Patient Instructions (Signed)
 [facility] - A DEPT OF [facility]  Discharge Instructions: Thank you for choosing [facility] to provide your oncology and hematology care.   If you have a lab appointment with the Cancer Center, please go directly to the Cancer Center and check in at the registration area.   Wear comfortable clothing and clothing appropriate for easy access to any Portacath or PICC line.   We strive to give you quality time with your provider. You may need to reschedule your appointment if you arrive late (15 or more minutes).  Arriving late affects you and other patients whose appointments are after yours.  Also, if you miss three or more appointments without notifying the office, you may be dismissed from the clinic at the provider's discretion.      For prescription refill requests, have your pharmacy contact our office and allow 72 hours for refills to be completed.    Today you received the following chemotherapy and/or immunotherapy agents: durvalumab (IMFINZI)       To help prevent nausea and vomiting after your treatment, we encourage you to take your nausea medication as directed.  BELOW ARE SYMPTOMS THAT SHOULD BE REPORTED IMMEDIATELY: *FEVER GREATER THAN 100.4 F (38 C) OR HIGHER *CHILLS OR SWEATING *NAUSEA AND VOMITING THAT IS NOT CONTROLLED WITH YOUR NAUSEA MEDICATION *UNUSUAL SHORTNESS OF BREATH *UNUSUAL BRUISING OR BLEEDING *URINARY PROBLEMS (pain or burning when urinating, or frequent urination) *BOWEL PROBLEMS (unusual diarrhea, constipation, pain near the anus) TENDERNESS IN MOUTH AND THROAT WITH OR WITHOUT PRESENCE OF ULCERS (sore throat, sores in mouth, or a toothache) UNUSUAL RASH, SWELLING OR PAIN  UNUSUAL VAGINAL DISCHARGE OR ITCHING   Items with * indicate a potential emergency and should be followed up as soon as possible or go to the Emergency Department if any problems should occur.  Please show the CHEMOTHERAPY ALERT CARD or  IMMUNOTHERAPY ALERT CARD at check-in to the Emergency Department and triage nurse.  Should you have questions after your visit or need to cancel or reschedule your appointment, please contact [facility] - A DEPT OF [facility]  Dept: [phone]  and follow the prompts.  Office hours are 8:00 a.m. to 4:30 p.m. Monday - Friday. Please note that voicemails left after 4:00 p.m. may not be returned until the following business day.  We are closed weekends and major holidays. You have access to a nurse at all times for urgent questions. Please call the main number to the clinic Dept: [phone] and follow the prompts.   For any non-urgent questions, you may also contact your provider using MyChart. We now offer e-Visits for anyone 39 and older to request care online for non-urgent symptoms. For details visit [email or web address].   Also download the MyChart app! Go to the app store, search "MyChart", open the app, select [place], and log in with your MyChart username and password.

## 2023-10-24 NOTE — Progress Notes (Signed)
 [facility] [phone]) [phone]   [phone]  OFFICE PROGRESS NOTE  [name], MD [address] [place] [address]  DIAGNOSIS: Stage IIIb (T4, N2, M0) non-small cell lung cancer, squamous cell carcinoma presented with large right lower lobe lung mass in addition to other right upper lobe pulmonary nodule and right hilar and mediastinal lymphadenopathy in September 2024.   PRIOR THERAPY: A course of concurrent chemoradiation with weekly carboplatin for AUC of 2 and paclitaxel 45 Mg/M2. First dose June 06, 2023. Status post 6 cycles.    CURRENT THERAPY: Consolidation with Imfinzi 1500 mg IV every 4 weeks. First dose expected on 08/29/2023. Status post 2 cycle of treatment.   INTERVAL HISTORY: EMMALYNNE Salazar 84 y.o. female returns to the clinic today for follow-up visit accompanied by her daughter.Discussed the use of AI scribe software for clinical note transcription with the patient, who gave verbal consent to proceed.  History of Present Illness   Kristy Salazar is an 83 year old female with stage III B non-small cell lung cancer who presents for evaluation before starting cycle number three of her treatment.  Diagnosed with stage III B non-small cell lung cancer in September 2024, she completed a course of concurrent chemoradiation with weekly carboplatin and paclitaxel. She is currently on consolidation treatment with durvalumab every four weeks and has completed two cycles.  She feels good overall and denies significant side effects from the immunotherapy, such as rash, diarrhea, or itching. However, she has been experiencing a cough, which is particularly severe at night and in the morning. She has not taken any cough medicine yet.  Her weight has fluctuated, but she has gained five pounds since the last visit, currently weighing 93.3 pounds.  No chest pain or shortness of breath.        MEDICAL HISTORY: Past Medical History:  Diagnosis  Date   Arthritis    Asthma    COPD (chronic obstructive pulmonary disease) (HCC)    Diabetes mellitus    patient denies   Hypertension    Seasonal allergies     ALLERGIES:  is allergic to erythromycin.  MEDICATIONS:  Current Outpatient Medications  Medication Sig Dispense Refill   albuterol (VENTOLIN HFA) 108 (90 Base) MCG/ACT inhaler Inhale 2 puffs into the lungs every 6 (six) hours as needed for wheezing or shortness of breath. 8 g 2   FARXIGA 10 MG TABS tablet Take 10 mg by mouth daily.     Fluticasone-Umeclidin-Vilant 100-62.5-25 MCG/INH AEPB Inhale 2 application  into the lungs daily as needed.     HYDROcodone-acetaminophen (HYCET) 7.5-325 mg/15 ml solution Take 10 mLs by mouth 4 (four) times daily as needed for moderate pain (pain score 4-6). 120 mL 0   megestrol (MEGACE) 40 MG/ML suspension Take 40 mg by mouth daily.     mirtazapine (REMERON) 7.5 MG tablet Take 7.5 mg by mouth at bedtime.     montelukast (SINGULAIR) 10 MG tablet Take 10 mg by mouth daily. For seasonal allergies     Multiple Vitamin (MULTIVITAMIN WITH MINERALS) TABS tablet Take 1 tablet by mouth daily.     pantoprazole (PROTONIX) 40 MG tablet Take 1 tablet (40 mg total) by mouth daily. Follow up with gastroenterology for additional instructions 30 tablet 1   potassium chloride 20 MEQ/15ML (10%) SOLN Take 15 mLs (20 mEq total) by mouth 2 (two) times daily. 270 mL 0   prochlorperazine (COMPAZINE) 10 MG tablet TAKE 1 TABLET BY MOUTH  EVERY 6 HOURS AS NEEDED FOR NAUSEA FOR VOMITING 30 tablet 0   rosuvastatin (CRESTOR) 10 MG tablet Take 10 mg by mouth daily.       sucralfate (CARAFATE) 1 g tablet Take 1 tablet (1 g total) by mouth 4 (four) times daily -  with meals and at bedtime. Crush 1 tablet in 1 oz water and drink 5 min before meals for radiation induced esophagitis 120 tablet 2   tiotropium (SPIRIVA) 18 MCG inhalation capsule Place 18 mcg into inhaler and inhale daily.     No current facility-administered  medications for this visit.    SURGICAL HISTORY:  Past Surgical History:  Procedure Laterality Date   ABDOMINAL HYSTERECTOMY     APPENDECTOMY     BACK SURGERY     BRONCHIAL BIOPSY  05/17/2023   Procedure: BRONCHIAL BIOPSIES;  Surgeon: Denson Flake, MD;  Location: [facility] ENDOSCOPY;  Service: Pulmonary;;   BRONCHIAL BRUSHINGS  05/17/2023   Procedure: BRONCHIAL BRUSHINGS;  Surgeon: Denson Flake, MD;  Location: [facility] ENDOSCOPY;  Service: Pulmonary;;   BRONCHIAL NEEDLE ASPIRATION BIOPSY  05/17/2023   Procedure: BRONCHIAL NEEDLE ASPIRATION BIOPSIES;  Surgeon: Denson Flake, MD;  Location: [facility] ENDOSCOPY;  Service: Pulmonary;;   ESOPHAGOGASTRODUODENOSCOPY (EGD) WITH PROPOFOL N/A 09/13/2023   Procedure: ESOPHAGOGASTRODUODENOSCOPY (EGD) WITH PROPOFOL;  Surgeon: Renaye Carp, DO;  Location: [facility] ENDOSCOPY;  Service: Gastroenterology;  Laterality: N/A;   IR IMAGING GUIDED PORT INSERTION  06/03/2023   JOINT REPLACEMENT     TOTAL HIP ARTHROPLASTY     TOTAL KNEE ARTHROPLASTY     VIDEO BRONCHOSCOPY WITH ENDOBRONCHIAL ULTRASOUND Right 05/17/2023   Procedure: VIDEO BRONCHOSCOPY WITH ENDOBRONCHIAL ULTRASOUND;  Surgeon: Denson Flake, MD;  Location: [facility] ENDOSCOPY;  Service: Pulmonary;  Laterality: Right;    REVIEW OF SYSTEMS:  A comprehensive review of systems was negative except for: Respiratory: positive for cough and dyspnea on exertion   PHYSICAL EXAMINATION: General appearance: alert, cooperative, fatigued, and no distress Head: Normocephalic, without obvious abnormality, atraumatic Neck: no adenopathy, no JVD, supple, symmetrical, trachea midline, and thyroid not enlarged, symmetric, no tenderness/mass/nodules Lymph nodes: Cervical, supraclavicular, and axillary nodes normal. Resp: clear to auscultation bilaterally Back: symmetric, no curvature. ROM normal. No CVA tenderness. Cardio: regular rate and rhythm, S1, S2 normal, no murmur, click, rub or gallop GI: soft, non-tender; bowel sounds normal;  no masses,  no organomegaly Extremities: extremities normal, atraumatic, no cyanosis or edema  ECOG PERFORMANCE STATUS: 1 - Symptomatic but completely ambulatory  Blood pressure (!) 106/95, pulse 96, temperature 98.3 F (36.8 C), temperature source Temporal, resp. rate 17, height 5\' 11"  (1.803 m), weight 93 lb 4.8 oz (42.3 kg), SpO2 99%.  LABORATORY DATA: Lab Results  Component Value Date   WBC 3.8 (L) 09/27/2023   HGB 11.3 (L) 09/27/2023   HCT 34.5 (L) 09/27/2023   MCV 94.3 09/27/2023   PLT 397 09/27/2023      Chemistry      Component Value Date/Time   NA 140 09/27/2023 0750   K 2.9 (L) 09/27/2023 0750   CL 112 (H) 09/27/2023 0750   CO2 22 09/27/2023 0750   BUN 11 09/27/2023 0750   CREATININE 0.54 09/27/2023 0750      Component Value Date/Time   CALCIUM 9.6 09/27/2023 0750   ALKPHOS 71 09/27/2023 0750   AST 24 09/27/2023 0750   ALT 25 09/27/2023 0750   BILITOT 0.3 09/27/2023 0750       RADIOGRAPHIC STUDIES: No results found.  ASSESSMENT AND PLAN:  This is a very pleasant 83 years old African-American female with stage IIIb (T4, N2, M0) non-small cell lung cancer, squamous cell carcinoma presented with large right lower lobe lung mass in addition to other right upper lobe pulmonary nodule and right hilar and mediastinal lymphadenopathy in September 2024.  She underwent a course of concurrent chemoradiation with weekly carboplatin for AUC of 2 and paclitaxel 45 Mg/M2.  Status post 6 cycles.  She had good response to this treatment and she is currently undergoing a course of consolidation treatment with immunotherapy with Imfinzi 1500 Mg IV every 4 weeks status post 2 cycles.  She has been tolerating her treatment fairly well.    Stage IIIB non-small cell lung cancer (NSCLC), squamous cell carcinoma Stage IIIB NSCLC, squamous cell carcinoma, diagnosed in September 2024. Currently receiving consolidation therapy with durvalumab every four weeks, post two cycles. No  significant adverse effects from immunotherapy. Reports nocturnal and morning cough, possibly due to allergies or sinus issues, without associated chest pain or dyspnea. Weight increased from 87 to 93.3 pounds, indicating well-managed nutritional status. Plan to proceed with cycle 3 of durvalumab today. A scan will be scheduled 7-10 days before the next visit to evaluate treatment response. - Administer cycle 3 of durvalumab today - Recommend over-the-counter Delsym for cough management - Schedule a scan 7-10 days before the next visit to assess treatment response - Ensure hospital contacts her to schedule the scan - Instruct her to call center scheduling if not contacted within 1-2 weeks   The patient was advised to call immediately if she has any other concerning symptoms in the interval. The patient voices understanding of current disease status and treatment options and is in agreement with the current care plan.  All questions were answered. The patient knows to call the clinic with any problems, questions or concerns. We can certainly see the patient much sooner if necessary.  The total time spent in the appointment was 20 minutes.  Disclaimer: This note was dictated with voice recognition software. Similar sounding words can inadvertently be transcribed and may not be corrected upon review.

## 2023-10-24 NOTE — Progress Notes (Signed)
 Nutrition Follow-up:  Patient with stage III non-small cell lung cancer.  Completed concurrent chemotherapy and radiation (last radiation on 1/14).  Receiving imfinzi.    Met with patient during infusion.  Eating peanut butter nabs during visit.  Reports that she is eating everything.  Denies nutrition impact symptoms other than says foods sometimes have a hard time going down esophagus.  Says that she drinks a little water and it helps to get it down.  Drinks 3-4 ensure shakes a day (350 calorie).  Yesterday ate beef tips, mashed potatoes and cabbage.  Snacks on nabs, jello, pudding, cheese puffs.  Family helps cook meals for her.     Medications: megace, remeron, KCL  Labs: K 3.4, glucose 119  Anthropometrics:   Weight 93 lb 4.8 oz today 86 lb on 3/18 93 lb 6 oz on 2/21   NUTRITION DIAGNOSIS: Unintentional weight loss continues   Severe malnutrition continues   INTERVENTION:  Continue high calorie, high protein foods Discussed soft, moist foods for ease of going down esophagus Continue 350 calorie oral nutritional supplement    MONITORING, EVALUATION, GOAL: weight trends, intake   NEXT VISIT: Tuesday, June 10 during infusion  Macyn Shropshire B. Zollie Hipp, CSO, LDN Registered Dietitian [phone]

## 2023-10-25 ENCOUNTER — Telehealth: Payer: Self-pay

## 2023-10-25 ENCOUNTER — Other Ambulatory Visit: Payer: Self-pay

## 2023-10-25 LAB — T4: T4, Total: 7.1 ug/dL (ref 4.5–12.0)

## 2023-10-25 NOTE — Telephone Encounter (Signed)
 Notified the pt daughter about Kristy Salazar completed Disability forms, faxed and confirmation received. The stated that Kristy Salazar has an appt today in office and they will pick up their hard copy. No questions or concerns at this time.

## 2023-11-04 ENCOUNTER — Other Ambulatory Visit: Payer: Self-pay | Admitting: Gastroenterology

## 2023-11-11 ENCOUNTER — Ambulatory Visit (HOSPITAL_COMMUNITY)
Admission: RE | Admit: 2023-11-11 | Discharge: 2023-11-11 | Disposition: A | Discharge status: Home / Self Care | Source: Ambulatory Visit | Attending: Internal Medicine | Admitting: Internal Medicine

## 2023-11-11 DIAGNOSIS — C3490 Malignant neoplasm of unspecified part of unspecified bronchus or lung: Secondary | ICD-10-CM | POA: Insufficient documentation

## 2023-11-11 MED ORDER — IOHEXOL 300 MG/ML  SOLN
75.0000 mL | Freq: Once | INTRAMUSCULAR | Status: AC | PRN
  Administered 2023-11-11: 75 mL via INTRAVENOUS

## 2023-11-11 MED ORDER — HEPARIN SOD (PORK) LOCK FLUSH 100 UNIT/ML IV SOLN
500.0000 [IU] | Freq: Once | INTRAVENOUS | Status: AC
Start: 2023-11-11 — End: 2023-11-11
  Administered 2023-11-11: 100 [IU] via INTRAVENOUS

## 2023-11-13 ENCOUNTER — Observation Stay (HOSPITAL_COMMUNITY)
Admission: EM | Admit: 2023-11-13 | Discharge: 2023-11-15 | Disposition: A | Discharge status: Home / Self Care | Attending: Internal Medicine | Admitting: Internal Medicine

## 2023-11-13 ENCOUNTER — Encounter (HOSPITAL_COMMUNITY): Payer: Self-pay

## 2023-11-13 ENCOUNTER — Other Ambulatory Visit: Payer: Self-pay

## 2023-11-13 ENCOUNTER — Emergency Department (HOSPITAL_COMMUNITY)

## 2023-11-13 DIAGNOSIS — Z96649 Presence of unspecified artificial hip joint: Secondary | ICD-10-CM | POA: Diagnosis not present

## 2023-11-13 DIAGNOSIS — I700 Atherosclerosis of aorta: Secondary | ICD-10-CM | POA: Diagnosis not present

## 2023-11-13 DIAGNOSIS — Z7984 Long term (current) use of oral hypoglycemic drugs: Secondary | ICD-10-CM | POA: Insufficient documentation

## 2023-11-13 DIAGNOSIS — K208 Other esophagitis without bleeding: Secondary | ICD-10-CM | POA: Diagnosis not present

## 2023-11-13 DIAGNOSIS — Z79899 Other long term (current) drug therapy: Secondary | ICD-10-CM | POA: Diagnosis not present

## 2023-11-13 DIAGNOSIS — Z96659 Presence of unspecified artificial knee joint: Secondary | ICD-10-CM | POA: Diagnosis not present

## 2023-11-13 DIAGNOSIS — E119 Type 2 diabetes mellitus without complications: Secondary | ICD-10-CM | POA: Diagnosis not present

## 2023-11-13 DIAGNOSIS — Z85118 Personal history of other malignant neoplasm of bronchus and lung: Secondary | ICD-10-CM | POA: Insufficient documentation

## 2023-11-13 DIAGNOSIS — Z85828 Personal history of other malignant neoplasm of skin: Secondary | ICD-10-CM | POA: Diagnosis not present

## 2023-11-13 DIAGNOSIS — C3431 Malignant neoplasm of lower lobe, right bronchus or lung: Secondary | ICD-10-CM | POA: Diagnosis not present

## 2023-11-13 DIAGNOSIS — Z8709 Personal history of other diseases of the respiratory system: Secondary | ICD-10-CM

## 2023-11-13 DIAGNOSIS — J449 Chronic obstructive pulmonary disease, unspecified: Secondary | ICD-10-CM | POA: Diagnosis not present

## 2023-11-13 DIAGNOSIS — R1310 Dysphagia, unspecified: Principal | ICD-10-CM

## 2023-11-13 DIAGNOSIS — F17210 Nicotine dependence, cigarettes, uncomplicated: Secondary | ICD-10-CM | POA: Diagnosis not present

## 2023-11-13 DIAGNOSIS — T66XXXA Radiation sickness, unspecified, initial encounter: Principal | ICD-10-CM

## 2023-11-13 DIAGNOSIS — K222 Esophageal obstruction: Secondary | ICD-10-CM | POA: Diagnosis not present

## 2023-11-13 DIAGNOSIS — I493 Ventricular premature depolarization: Secondary | ICD-10-CM | POA: Insufficient documentation

## 2023-11-13 DIAGNOSIS — R079 Chest pain, unspecified: Secondary | ICD-10-CM | POA: Diagnosis not present

## 2023-11-13 DIAGNOSIS — R918 Other nonspecific abnormal finding of lung field: Secondary | ICD-10-CM | POA: Diagnosis not present

## 2023-11-13 DIAGNOSIS — K209 Esophagitis, unspecified without bleeding: Secondary | ICD-10-CM | POA: Diagnosis not present

## 2023-11-13 DIAGNOSIS — E876 Hypokalemia: Secondary | ICD-10-CM | POA: Diagnosis not present

## 2023-11-13 DIAGNOSIS — I10 Essential (primary) hypertension: Secondary | ICD-10-CM | POA: Diagnosis not present

## 2023-11-13 DIAGNOSIS — R1112 Projectile vomiting: Secondary | ICD-10-CM | POA: Diagnosis not present

## 2023-11-13 HISTORY — DX: Other esophagitis without bleeding: K20.80

## 2023-11-13 HISTORY — DX: Malignant neoplasm of unspecified part of unspecified bronchus or lung: C34.90

## 2023-11-13 HISTORY — DX: Other esophagitis without bleeding: T66.XXXA

## 2023-11-13 LAB — CBC WITH DIFFERENTIAL/PLATELET
Abs Immature Granulocytes: 0.09 10*3/uL — ABNORMAL HIGH (ref 0.00–0.07)
Basophils Absolute: 0 10*3/uL (ref 0.0–0.1)
Basophils Relative: 0 %
Eosinophils Absolute: 0 10*3/uL (ref 0.0–0.5)
Eosinophils Relative: 0 %
HCT: 43.8 % (ref 36.0–46.0)
Hemoglobin: 14.4 g/dL (ref 12.0–15.0)
Immature Granulocytes: 1 %
Lymphocytes Relative: 11 %
Lymphs Abs: 0.8 10*3/uL (ref 0.7–4.0)
MCH: 31.5 pg (ref 26.0–34.0)
MCHC: 32.9 g/dL (ref 30.0–36.0)
MCV: 95.8 fL (ref 80.0–100.0)
Monocytes Absolute: 0.5 10*3/uL (ref 0.1–1.0)
Monocytes Relative: 6 %
Neutro Abs: 6.4 10*3/uL (ref 1.7–7.7)
Neutrophils Relative %: 82 %
Platelets: 276 10*3/uL (ref 150–400)
RBC: 4.57 MIL/uL (ref 3.87–5.11)
RDW: 14.2 % (ref 11.5–15.5)
WBC: 7.9 10*3/uL (ref 4.0–10.5)
nRBC: 0 % (ref 0.0–0.2)

## 2023-11-13 LAB — COMPREHENSIVE METABOLIC PANEL WITH GFR
ALT: 18 U/L (ref 0–44)
AST: 20 U/L (ref 15–41)
Albumin: 3.5 g/dL (ref 3.5–5.0)
Alkaline Phosphatase: 62 U/L (ref 38–126)
Anion gap: 8 (ref 5–15)
BUN: 10 mg/dL (ref 8–23)
CO2: 25 mmol/L (ref 22–32)
Calcium: 9.5 mg/dL (ref 8.9–10.3)
Chloride: 106 mmol/L (ref 98–111)
Creatinine, Ser: 0.57 mg/dL (ref 0.44–1.00)
GFR, Estimated: >60 mL/min (ref >60)
Glucose, Bld: 109 mg/dL — ABNORMAL HIGH (ref 70–99)
Potassium: 3 mmol/L — ABNORMAL LOW (ref 3.5–5.1)
Sodium: 139 mmol/L (ref 135–145)
Total Bilirubin: 0.7 mg/dL (ref 0.0–1.2)
Total Protein: 8 g/dL (ref 6.5–8.1)

## 2023-11-13 MED ORDER — SODIUM CHLORIDE 0.9 % IV SOLN: Freq: Once | INTRAVENOUS | Status: DC

## 2023-11-13 MED ORDER — OXYCODONE HCL 5 MG/5ML PO SOLN
5.0000 mg | Freq: Once | ORAL | Status: AC
  Administered 2023-11-14: 5 mg via ORAL
  Filled 2023-11-13: qty 5

## 2023-11-13 MED ORDER — SODIUM CHLORIDE 0.9 % IV BOLUS
500.0000 mL | Freq: Once | INTRAVENOUS | Status: AC
  Administered 2023-11-14: 500 mL via INTRAVENOUS

## 2023-11-13 MED ORDER — PANTOPRAZOLE SODIUM 40 MG IV SOLR
40.0000 mg | Freq: Once | INTRAVENOUS | Status: AC
  Administered 2023-11-14: 40 mg via INTRAVENOUS
  Filled 2023-11-13: qty 10

## 2023-11-13 NOTE — ED Provider Notes (Signed)
 [place] EMERGENCY DEPARTMENT AT [facility] Provider Note   CSN: [id number] Arrival date & time: 11/13/23  2139     History  Chief Complaint  Patient presents with   Gastroesophageal Reflux    Kristy Salazar is a 83 y.o. female.  Patient to ED with difficulty swallowing. This is a chronic issue since receiving radiation and chemo for Stage IIIb (T4, N2, M0) non-small cell lung cancer, squamous cell carcinoma presented with large right lower lobe lung mass in addition to other right upper lobe pulmonary nodule and right hilar and mediastinal lymphadenopathy. Recent admission for similar symptoms, CT and EGD showing stenotic esophagus secondary to radiation with plan for outpatient GI follow up to consider dilation in 4 weeks as she was consider high risk for perforation during that admission. She is scheduled for this procedure 5/20 Honey Lusty). She returns to the ED tonight with progressively worsening symptoms of severe dysphagia, unable to eat, persistent vomiting and pain. She can swallow liquids and has been able to use her sucralfate  and PPI. Per daughter she ate a soft sandwich yesterday but today, everything eaten has come back up. No fever. She also has developed a cough with history of aspiration.   The history is provided by the patient. No language interpreter was used.  Gastroesophageal Reflux       Home Medications Prior to Admission medications   Medication Sig Start Date End Date Taking? Authorizing Provider  albuterol  (VENTOLIN  HFA) 108 (90 Base) MCG/ACT inhaler Inhale 2 puffs into the lungs every 6 (six) hours as needed for wheezing or shortness of breath. 08/28/21   Sofia, Leslie K, PA-C  FARXIGA 10 MG TABS tablet Take 10 mg by mouth daily. 07/27/23   [provider]  Fluticasone-Umeclidin-Vilant 100-62.5-25 MCG/INH AEPB Inhale 2 application  into the lungs daily as needed.    [provider]  HYDROcodone -acetaminophen  (HYCET) 7.5-325  mg/15 ml solution Take 10 mLs by mouth 4 (four) times daily as needed for moderate pain (pain score 4-6). 07/15/23   Kenith Payer, MD  megestrol (MEGACE) 40 MG/ML suspension Take 40 mg by mouth daily. 08/31/23   [provider]  mirtazapine (REMERON) 7.5 MG tablet Take 7.5 mg by mouth at bedtime. 08/30/23   [provider]  montelukast  (SINGULAIR ) 10 MG tablet Take 10 mg by mouth daily. For seasonal allergies    [provider]  Multiple Vitamin (MULTIVITAMIN WITH MINERALS) TABS tablet Take 1 tablet by mouth daily.    [provider]  pantoprazole  (PROTONIX ) 40 MG tablet Take 1 tablet (40 mg total) by mouth daily. Follow up with gastroenterology for additional instructions 09/15/23 11/23/23  Etter Hermann., MD  potassium chloride  20 MEQ/15ML (10%) SOLN Take 15 mLs (20 mEq total) by mouth 2 (two) times daily. 09/27/23   Heilingoetter, Cassandra L, PA-C  prochlorperazine  (COMPAZINE ) 10 MG tablet TAKE 1 TABLET BY MOUTH EVERY 6 HOURS AS NEEDED FOR NAUSEA FOR VOMITING 08/30/23   Marlene Simas, MD  rosuvastatin  (CRESTOR ) 10 MG tablet Take 10 mg by mouth daily.      [provider]  sucralfate  (CARAFATE ) 1 g tablet Take 1 tablet (1 g total) by mouth 4 (four) times daily -  with meals and at bedtime. Crush 1 tablet in 1 oz water and drink 5 min before meals for radiation induced esophagitis 07/04/23   Bettejane Brownie, PA-C  tiotropium (SPIRIVA ) 18 MCG inhalation capsule Place 18 mcg into inhaler and inhale daily.  [provider]      Allergies    Erythromycin    Review of Systems   Review of Systems  Physical Exam Updated Vital Signs BP (!) 159/92   Pulse 98   Temp 98.9 F (37.2 C) (Oral)   Resp 18   SpO2 100%  Physical Exam Vitals and nursing note reviewed.  Constitutional:      Appearance: She is ill-appearing.     Comments: Frail appearing patient, uncomfortable appearing, actively vomiting.  Cardiovascular:     Rate  and Rhythm: Normal rate and regular rhythm.  Pulmonary:     Effort: Pulmonary effort is normal.     Breath sounds: No wheezing, rhonchi or rales.  Musculoskeletal:        General: Normal range of motion.     Cervical back: Normal range of motion and neck supple.  Skin:    General: Skin is warm and dry.  Neurological:     Mental Status: She is oriented to person, place, and time.     ED Results / Procedures / Treatments   Labs (all labs ordered are listed, but only abnormal results are displayed) Labs Reviewed  CBC WITH DIFFERENTIAL/PLATELET - Abnormal; Notable for the following components:      Result Value   Abs Immature Granulocytes 0.09 (*)    All other components within normal limits  COMPREHENSIVE METABOLIC PANEL WITH GFR - Abnormal; Notable for the following components:   Potassium 3.0 (*)    Glucose, Bld 109 (*)    All other components within normal limits   Results for orders placed or performed during the hospital encounter of 11/13/23  CBC with Differential   Collection Time: 11/13/23 11:22 PM  Result Value Ref Range   WBC 7.9 4.0 - 10.5 K/uL   RBC 4.57 3.87 - 5.11 MIL/uL   Hemoglobin 14.4 12.0 - 15.0 g/dL   HCT 10.2 72.5 - 36.6 %   MCV 95.8 80.0 - 100.0 fL   MCH 31.5 26.0 - 34.0 pg   MCHC 32.9 30.0 - 36.0 g/dL   RDW 44.0 34.7 - 42.5 %   Platelets 276 150 - 400 K/uL   nRBC 0.0 0.0 - 0.2 %   Neutrophils Relative % 82 %   Neutro Abs 6.4 1.7 - 7.7 K/uL   Lymphocytes Relative 11 %   Lymphs Abs 0.8 0.7 - 4.0 K/uL   Monocytes Relative 6 %   Monocytes Absolute 0.5 0.1 - 1.0 K/uL   Eosinophils Relative 0 %   Eosinophils Absolute 0.0 0.0 - 0.5 K/uL   Basophils Relative 0 %   Basophils Absolute 0.0 0.0 - 0.1 K/uL   Immature Granulocytes 1 %   Abs Immature Granulocytes 0.09 (H) 0.00 - 0.07 K/uL  Comprehensive metabolic panel   Collection Time: 11/13/23 11:22 PM  Result Value Ref Range   Sodium 139 135 - 145 mmol/L   Potassium 3.0 (L) 3.5 - 5.1 mmol/L    Chloride 106 98 - 111 mmol/L   CO2 25 22 - 32 mmol/L   Glucose, Bld 109 (H) 70 - 99 mg/dL   BUN 10 8 - 23 mg/dL   Creatinine, Ser 9.56 0.44 - 1.00 mg/dL   Calcium  9.5 8.9 - 10.3 mg/dL   Total Protein 8.0 6.5 - 8.1 g/dL   Albumin 3.5 3.5 - 5.0 g/dL   AST 20 15 - 41 U/L   ALT 18 0 - 44 U/L   Alkaline Phosphatase 62 38 - 126 U/L  Total Bilirubin 0.7 0.0 - 1.2 mg/dL   GFR, Estimated >24 >40 mL/min   Anion gap 8 5 - 15    EKG None  Radiology DG Chest Portable 1 View Result Date: 11/13/2023 CLINICAL DATA:  Projectile vomiting and chest pain EXAM: PORTABLE CHEST 1 VIEW COMPARISON:  11/11/2023 FINDINGS: Cardiac shadow is stable. Aortic calcifications are seen. Right chest wall port is noted in place. Lungs are hyperinflated. Persistent spiculated mass is noted in the right base stable from the prior CT. No pneumothorax or sizable effusion is seen. IMPRESSION: Stable right lower lobe mass lesion. Hyperinflation consistent with COPD. Electronically Signed   By: Violeta Grey M.D.   On: 11/13/2023 23:34    Procedures Procedures    Medications Ordered in ED Medications  oxyCODONE  (ROXICODONE ) 5 MG/5ML solution 5 mg (has no administration in time range)  sodium chloride  0.9 % bolus 500 mL (has no administration in time range)  0.9 %  sodium chloride  infusion (has no administration in time range)  pantoprazole  (PROTONIX ) injection 40 mg (has no administration in time range)    ED Course/ Medical Decision Making/ A&P                                 Medical Decision Making This patient presents to the ED for concern of dysphagia, this involves an extensive number of treatment options, and is a complaint that carries with it a high risk of complications and morbidity.  The differential diagnosis includes obstruction, esophageal food bolus, PUD   Co morbidities that complicate the patient evaluation  Chemoradation for non-small cell lung CA with radiation esophagitis   Additional  history obtained:  Additional history and/or information obtained from chart review, notable for Review of recent admission,   Lab Tests:  I Ordered, and personally interpreted labs.  The pertinent results include:  K+ 3.0, otherwise normal electrolytes, normal renal function; WBC 7.9, normal hgb    Imaging Studies ordered:  I ordered imaging studies including cxr Per radiologist interpretation: stable RLL mass, o/w no acute findings   Cardiac Monitoring:  The patient was maintained on a cardiac monitor.  I personally viewed and interpreted the cardiac monitored which showed an underlying rhythm of: n/a   Medicines ordered and prescription drug management:  I ordered medication including oxycodone  solution  for pain Reevaluation of the patient after these medicines showed that the patient: pending I have reviewed the patients home medicines and have made adjustments as needed   Test Considered:  N/a   Critical Interventions:  N/a   Consultations Obtained:  I requested consultation with the GI Honey Lusty) via secure chat for inpatient consultation to continue GI care   Problem List / ED Course:  Patient with known radiation esophagitis with stenosis, pending dilation 5/20 with Dr. Honey Lusty Here tonight with progressively worsening dysphagia over the last 2 weeks, today unable to pass solid without regurgitation. Able to swallow liquids. Significantly uncomfortable Labs unremarkable. No evidence aspiration on CXR Feel she will need admission for symptom control, re-evaluation by GI for esophageal dilation while inpatient IV protonix , fluids ordered.    Social Determinants of Health:  Lives with daughter   Disposition:  After consideration of the diagnostic results and the patients response to treatment, I feel that the patient would benefit from admit.   Amount and/or Complexity of Data Reviewed Labs: ordered. Radiology: ordered.  Risk Prescription  drug management. Decision regarding hospitalization.  Final Clinical Impression(s) / ED Diagnoses Final diagnoses:  Radiation esophagitis    Rx / DC Orders ED Discharge Orders     None         Mandy Second, PA-C 11/14/23 0002    Ballard Bongo, MD 11/14/23 [phone]

## 2023-11-13 NOTE — ED Triage Notes (Signed)
 Pt came in for projectile liquid vomiting for the last few weeks. Pt stated her food gets stuck around the center of her chest anytime she swallows.

## 2023-11-14 ENCOUNTER — Encounter (HOSPITAL_COMMUNITY): Payer: Self-pay | Admitting: Internal Medicine

## 2023-11-14 ENCOUNTER — Observation Stay (HOSPITAL_COMMUNITY): Admitting: Anesthesiology

## 2023-11-14 ENCOUNTER — Encounter (HOSPITAL_COMMUNITY)
Admission: EM | Disposition: A | Discharge status: Home / Self Care | Payer: Self-pay | Source: Home / Self Care | Attending: Emergency Medicine

## 2023-11-14 ENCOUNTER — Observation Stay (HOSPITAL_BASED_OUTPATIENT_CLINIC_OR_DEPARTMENT_OTHER): Admitting: Anesthesiology

## 2023-11-14 DIAGNOSIS — Z96649 Presence of unspecified artificial hip joint: Secondary | ICD-10-CM | POA: Diagnosis not present

## 2023-11-14 DIAGNOSIS — C3431 Malignant neoplasm of lower lobe, right bronchus or lung: Secondary | ICD-10-CM

## 2023-11-14 DIAGNOSIS — R1310 Dysphagia, unspecified: Secondary | ICD-10-CM | POA: Diagnosis not present

## 2023-11-14 DIAGNOSIS — Z85828 Personal history of other malignant neoplasm of skin: Secondary | ICD-10-CM | POA: Diagnosis not present

## 2023-11-14 DIAGNOSIS — K209 Esophagitis, unspecified without bleeding: Secondary | ICD-10-CM | POA: Diagnosis not present

## 2023-11-14 DIAGNOSIS — R1319 Other dysphagia: Secondary | ICD-10-CM

## 2023-11-14 DIAGNOSIS — K208 Other esophagitis without bleeding: Secondary | ICD-10-CM | POA: Diagnosis not present

## 2023-11-14 DIAGNOSIS — E119 Type 2 diabetes mellitus without complications: Secondary | ICD-10-CM | POA: Diagnosis not present

## 2023-11-14 DIAGNOSIS — J449 Chronic obstructive pulmonary disease, unspecified: Secondary | ICD-10-CM

## 2023-11-14 DIAGNOSIS — Z8709 Personal history of other diseases of the respiratory system: Secondary | ICD-10-CM

## 2023-11-14 DIAGNOSIS — K222 Esophageal obstruction: Secondary | ICD-10-CM | POA: Diagnosis not present

## 2023-11-14 DIAGNOSIS — Z85118 Personal history of other malignant neoplasm of bronchus and lung: Secondary | ICD-10-CM | POA: Diagnosis not present

## 2023-11-14 DIAGNOSIS — E876 Hypokalemia: Secondary | ICD-10-CM | POA: Diagnosis not present

## 2023-11-14 DIAGNOSIS — I10 Essential (primary) hypertension: Secondary | ICD-10-CM | POA: Diagnosis not present

## 2023-11-14 DIAGNOSIS — T66XXXA Radiation sickness, unspecified, initial encounter: Secondary | ICD-10-CM

## 2023-11-14 DIAGNOSIS — Z79899 Other long term (current) drug therapy: Secondary | ICD-10-CM | POA: Diagnosis not present

## 2023-11-14 DIAGNOSIS — Z96659 Presence of unspecified artificial knee joint: Secondary | ICD-10-CM | POA: Diagnosis not present

## 2023-11-14 DIAGNOSIS — F17210 Nicotine dependence, cigarettes, uncomplicated: Secondary | ICD-10-CM | POA: Diagnosis not present

## 2023-11-14 HISTORY — PX: ESOPHAGOGASTRODUODENOSCOPY: SHX5428

## 2023-11-14 LAB — COMPREHENSIVE METABOLIC PANEL WITH GFR
ALT: 14 U/L (ref 0–44)
AST: 15 U/L (ref 15–41)
Albumin: 2.9 g/dL — ABNORMAL LOW (ref 3.5–5.0)
Alkaline Phosphatase: 49 U/L (ref 38–126)
Anion gap: 5 (ref 5–15)
BUN: 8 mg/dL (ref 8–23)
CO2: 22 mmol/L (ref 22–32)
Calcium: 8.5 mg/dL — ABNORMAL LOW (ref 8.9–10.3)
Chloride: 109 mmol/L (ref 98–111)
Creatinine, Ser: 0.39 mg/dL — ABNORMAL LOW (ref 0.44–1.00)
GFR, Estimated: >60 mL/min (ref >60)
Glucose, Bld: 96 mg/dL (ref 70–99)
Potassium: 3.5 mmol/L (ref 3.5–5.1)
Sodium: 136 mmol/L (ref 135–145)
Total Bilirubin: 0.4 mg/dL (ref 0.0–1.2)
Total Protein: 6.7 g/dL (ref 6.5–8.1)

## 2023-11-14 LAB — GLUCOSE, CAPILLARY
Glucose-Capillary: 96 mg/dL (ref 70–99)
Glucose-Capillary: 86 mg/dL (ref 70–99)
Glucose-Capillary: 98 mg/dL (ref 70–99)

## 2023-11-14 LAB — CBC WITH DIFFERENTIAL/PLATELET
Abs Immature Granulocytes: 0.07 10*3/uL (ref 0.00–0.07)
Basophils Absolute: 0 10*3/uL (ref 0.0–0.1)
Basophils Relative: 0 %
Eosinophils Absolute: 0 10*3/uL (ref 0.0–0.5)
Eosinophils Relative: 0 %
HCT: 36.6 % (ref 36.0–46.0)
Hemoglobin: 12.1 g/dL (ref 12.0–15.0)
Immature Granulocytes: 1 %
Lymphocytes Relative: 8 %
Lymphs Abs: 0.6 10*3/uL — ABNORMAL LOW (ref 0.7–4.0)
MCH: 31.8 pg (ref 26.0–34.0)
MCHC: 33.1 g/dL (ref 30.0–36.0)
MCV: 96.3 fL (ref 80.0–100.0)
Monocytes Absolute: 0.5 10*3/uL (ref 0.1–1.0)
Monocytes Relative: 6 %
Neutro Abs: 6.4 10*3/uL (ref 1.7–7.7)
Neutrophils Relative %: 85 %
Platelets: 246 10*3/uL (ref 150–400)
RBC: 3.8 MIL/uL — ABNORMAL LOW (ref 3.87–5.11)
RDW: 14.1 % (ref 11.5–15.5)
WBC: 7.6 10*3/uL (ref 4.0–10.5)
nRBC: 0 % (ref 0.0–0.2)

## 2023-11-14 LAB — TYPE AND SCREEN
ABO/RH(D): A POS
Antibody Screen: NEGATIVE

## 2023-11-14 LAB — PROTIME-INR
INR: 1.1 (ref 0.8–1.2)
Prothrombin Time: 14 s (ref 11.4–15.2)

## 2023-11-14 LAB — MAGNESIUM: Magnesium: 2 mg/dL (ref 1.7–2.4)

## 2023-11-14 SURGERY — EGD (ESOPHAGOGASTRODUODENOSCOPY)
Anesthesia: Monitor Anesthesia Care

## 2023-11-14 MED ORDER — SODIUM CHLORIDE 0.9% FLUSH
10.0000 mL | Freq: Two times a day (BID) | INTRAVENOUS | Status: DC
  Administered 2023-11-14: 10 mL
  Administered 2023-11-14: 20 mL
  Administered 2023-11-15: 10 mL

## 2023-11-14 MED ORDER — ACETAMINOPHEN 325 MG PO TABS
650.0000 mg | ORAL_TABLET | Freq: Four times a day (QID) | ORAL | Status: DC | PRN
  Administered 2023-11-14: 650 mg via ORAL
  Filled 2023-11-14: qty 2

## 2023-11-14 MED ORDER — FENTANYL CITRATE PF 50 MCG/ML IJ SOSY
25.0000 ug | PREFILLED_SYRINGE | INTRAMUSCULAR | Status: DC | PRN
  Administered 2023-11-14 – 2023-11-15 (×4): 25 ug via INTRAVENOUS
  Filled 2023-11-14 (×4): qty 1

## 2023-11-14 MED ORDER — SODIUM CHLORIDE 0.9% FLUSH: 10.0000 mL | INTRAVENOUS | Status: DC | PRN

## 2023-11-14 MED ORDER — PANTOPRAZOLE SODIUM 40 MG IV SOLR
40.0000 mg | Freq: Two times a day (BID) | INTRAVENOUS | Status: DC
  Administered 2023-11-14 – 2023-11-15 (×3): 40 mg via INTRAVENOUS
  Filled 2023-11-14 (×4): qty 10

## 2023-11-14 MED ORDER — ALBUTEROL SULFATE (2.5 MG/3ML) 0.083% IN NEBU: 2.5000 mg | INHALATION_SOLUTION | RESPIRATORY_TRACT | Status: DC | PRN

## 2023-11-14 MED ORDER — PROPOFOL 500 MG/50ML IV EMUL
INTRAVENOUS | Status: DC | PRN
  Administered 2023-11-14: 30 mg via INTRAVENOUS
  Administered 2023-11-14: 200 ug/kg/min via INTRAVENOUS

## 2023-11-14 MED ORDER — INSULIN ASPART 100 UNIT/ML IJ SOLN: 0.0000 [IU] | Freq: Four times a day (QID) | INTRAMUSCULAR | Status: DC

## 2023-11-14 MED ORDER — OXYCODONE HCL 5 MG PO TABS
5.0000 mg | ORAL_TABLET | Freq: Once | ORAL | Status: AC | PRN
  Administered 2023-11-14: 5 mg via ORAL
  Filled 2023-11-14: qty 1

## 2023-11-14 MED ORDER — TIOTROPIUM BROMIDE MONOHYDRATE 18 MCG IN CAPS: 18.0000 ug | ORAL_CAPSULE | Freq: Every day | RESPIRATORY_TRACT | Status: DC

## 2023-11-14 MED ORDER — SODIUM CHLORIDE 0.9 % IV SOLN
INTRAVENOUS | Status: DC
Start: 2023-11-14 — End: 2023-11-14

## 2023-11-14 MED ORDER — UMECLIDINIUM BROMIDE 62.5 MCG/ACT IN AEPB
1.0000 | INHALATION_SPRAY | Freq: Every day | RESPIRATORY_TRACT | Status: DC
  Administered 2023-11-14 – 2023-11-15 (×2): 1 via RESPIRATORY_TRACT
  Filled 2023-11-14: qty 7

## 2023-11-14 MED ORDER — ONDANSETRON HCL 4 MG/2ML IJ SOLN: 4.0000 mg | Freq: Four times a day (QID) | INTRAMUSCULAR | Status: DC | PRN

## 2023-11-14 MED ORDER — ACETAMINOPHEN 650 MG RE SUPP: 650.0000 mg | Freq: Four times a day (QID) | RECTAL | Status: DC | PRN

## 2023-11-14 MED ORDER — POTASSIUM CHLORIDE 10 MEQ/100ML IV SOLN
10.0000 meq | INTRAVENOUS | Status: AC
Start: 2023-11-14 — End: 2023-11-14
  Administered 2023-11-14 (×4): 10 meq via INTRAVENOUS
  Filled 2023-11-14 (×4): qty 100

## 2023-11-14 MED ORDER — CHLORHEXIDINE GLUCONATE CLOTH 2 % EX PADS
6.0000 | MEDICATED_PAD | Freq: Every day | CUTANEOUS | Status: DC
  Administered 2023-11-14 – 2023-11-15 (×2): 6 via TOPICAL

## 2023-11-14 MED ORDER — SODIUM CHLORIDE 0.9 % IV SOLN: INTRAVENOUS | Status: DC | PRN

## 2023-11-14 NOTE — Progress Notes (Signed)
 Mobility Specialist - Progress Note   11/14/23 0949  Mobility  Activity Ambulated with assistance in hallway  Level of Assistance Standby assist, set-up cues, supervision of patient - no hands on  Assistive Device  (HHA)  Distance Ambulated (ft) 200 ft  Activity Response Tolerated well  Mobility Referral Yes  Mobility visit 1 Mobility  Mobility Specialist Start Time (ACUTE ONLY) 0940  Mobility Specialist Stop Time (ACUTE ONLY) 0947  Mobility Specialist Time Calculation (min) (ACUTE ONLY) 7 min   Pt received in bed and agreeable to mobility. Pt held onto writers hand during session. Pt to bed after session with all needs met.    [facility]

## 2023-11-14 NOTE — Consult Note (Signed)
 Reason for Consult: Dysphagia Referring Physician: Hospital team  Kristy Salazar is an 83 y.o. female.  HPI: Patient seen and examined and her hospital computer chart reviewed and she had no swallowing problems prior to her radiation and her previous endoscopy was reviewed and she was planning to have a dilation in 2-3 weeks by my partner Dr. Honey Lusty but with increased frustration over solid food dysphagia if she presented to the emergency room and we are consulted for further workup and plans and she is not on any aspirin or blood thinners  Past Medical History:  Diagnosis Date   Arthritis    Asthma    COPD (chronic obstructive pulmonary disease) (HCC)    Diabetes mellitus    patient denies   Hypertension    Non-small cell lung cancer (HCC)    Radiation esophagitis    Seasonal allergies     Past Surgical History:  Procedure Laterality Date   ABDOMINAL HYSTERECTOMY     APPENDECTOMY     BACK SURGERY     BRONCHIAL BIOPSY  05/17/2023   Procedure: BRONCHIAL BIOPSIES;  Surgeon: Denson Flake, MD;  Location: [facility] ENDOSCOPY;  Service: Pulmonary;;   BRONCHIAL BRUSHINGS  05/17/2023   Procedure: BRONCHIAL BRUSHINGS;  Surgeon: Denson Flake, MD;  Location: [facility] ENDOSCOPY;  Service: Pulmonary;;   BRONCHIAL NEEDLE ASPIRATION BIOPSY  05/17/2023   Procedure: BRONCHIAL NEEDLE ASPIRATION BIOPSIES;  Surgeon: Denson Flake, MD;  Location: [facility] ENDOSCOPY;  Service: Pulmonary;;   ESOPHAGOGASTRODUODENOSCOPY (EGD) WITH PROPOFOL  N/A 09/13/2023   Procedure: ESOPHAGOGASTRODUODENOSCOPY (EGD) WITH PROPOFOL ;  Surgeon: Renaye Carp, DO;  Location: [facility] ENDOSCOPY;  Service: Gastroenterology;  Laterality: N/A;   IR IMAGING GUIDED PORT INSERTION  06/03/2023   JOINT REPLACEMENT     TOTAL HIP ARTHROPLASTY     TOTAL KNEE ARTHROPLASTY     VIDEO BRONCHOSCOPY WITH ENDOBRONCHIAL ULTRASOUND Right 05/17/2023   Procedure: VIDEO BRONCHOSCOPY WITH ENDOBRONCHIAL ULTRASOUND;  Surgeon: Denson Flake, MD;  Location: [facility]  ENDOSCOPY;  Service: Pulmonary;  Laterality: Right;    History reviewed. No pertinent family history.  Social History:  reports that she has been smoking cigarettes. She has a 12.5 pack-year smoking history. She has never used smokeless tobacco. She reports that she does not drink alcohol and does not use drugs.  Allergies:  Allergies  Allergen Reactions   Erythromycin Hives    Medications: I have reviewed the patient's current medications.  Results for orders placed or performed during the hospital encounter of 11/13/23 (from the past 48 hours)  CBC with Differential     Status: Abnormal   Collection Time: 11/13/23 11:22 PM  Result Value Ref Range   WBC 7.9 4.0 - 10.5 K/uL   RBC 4.57 3.87 - 5.11 MIL/uL   Hemoglobin 14.4 12.0 - 15.0 g/dL   HCT 16.1 09.6 - 04.5 %   MCV 95.8 80.0 - 100.0 fL   MCH 31.5 26.0 - 34.0 pg   MCHC 32.9 30.0 - 36.0 g/dL   RDW 40.9 81.1 - 91.4 %   Platelets 276 150 - 400 K/uL   nRBC 0.0 0.0 - 0.2 %   Neutrophils Relative % 82 %   Neutro Abs 6.4 1.7 - 7.7 K/uL   Lymphocytes Relative 11 %   Lymphs Abs 0.8 0.7 - 4.0 K/uL   Monocytes Relative 6 %   Monocytes Absolute 0.5 0.1 - 1.0 K/uL   Eosinophils Relative 0 %   Eosinophils Absolute 0.0 0.0 - 0.5 K/uL   Basophils Relative  0 %   Basophils Absolute 0.0 0.0 - 0.1 K/uL   Immature Granulocytes 1 %   Abs Immature Granulocytes 0.09 (H) 0.00 - 0.07 K/uL    Comment: Performed at [facility], [address] [place], [place] [address]  Comprehensive metabolic panel     Status: Abnormal   Collection Time: 11/13/23 11:22 PM  Result Value Ref Range   Sodium 139 135 - 145 mmol/L   Potassium 3.0 (L) 3.5 - 5.1 mmol/L   Chloride 106 98 - 111 mmol/L   CO2 25 22 - 32 mmol/L   Glucose, Bld 109 (H) 70 - 99 mg/dL    Comment: Glucose reference range applies only to samples taken after fasting for at least 8 hours.   BUN 10 8 - 23 mg/dL   Creatinine, Ser 6.57 0.44 - 1.00 mg/dL   Calcium  9.5 8.9 -  10.3 mg/dL   Total Protein 8.0 6.5 - 8.1 g/dL   Albumin 3.5 3.5 - 5.0 g/dL   AST 20 15 - 41 U/L   ALT 18 0 - 44 U/L   Alkaline Phosphatase 62 38 - 126 U/L   Total Bilirubin 0.7 0.0 - 1.2 mg/dL   GFR, Estimated >84 >69 mL/min    Comment: (NOTE) Calculated using the CKD-EPI Creatinine Equation (2021)    Anion gap 8 5 - 15    Comment: Performed at [facility], [address], [place], [place] [address]  CBC with Differential/Platelet     Status: Abnormal   Collection Time: 11/14/23  5:00 AM  Result Value Ref Range   WBC 7.6 4.0 - 10.5 K/uL   RBC 3.80 (L) 3.87 - 5.11 MIL/uL   Hemoglobin 12.1 12.0 - 15.0 g/dL   HCT 84.1 32.4 - 40.1 %   MCV 96.3 80.0 - 100.0 fL   MCH 31.8 26.0 - 34.0 pg   MCHC 33.1 30.0 - 36.0 g/dL   RDW 02.7 25.3 - 66.4 %   Platelets 246 150 - 400 K/uL   nRBC 0.0 0.0 - 0.2 %   Neutrophils Relative % 85 %   Neutro Abs 6.4 1.7 - 7.7 K/uL   Lymphocytes Relative 8 %   Lymphs Abs 0.6 (L) 0.7 - 4.0 K/uL   Monocytes Relative 6 %   Monocytes Absolute 0.5 0.1 - 1.0 K/uL   Eosinophils Relative 0 %   Eosinophils Absolute 0.0 0.0 - 0.5 K/uL   Basophils Relative 0 %   Basophils Absolute 0.0 0.0 - 0.1 K/uL   Immature Granulocytes 1 %   Abs Immature Granulocytes 0.07 0.00 - 0.07 K/uL    Comment: Performed at [facility], [address] [place], [place] [address]  Comprehensive metabolic panel with GFR     Status: Abnormal   Collection Time: 11/14/23  5:00 AM  Result Value Ref Range   Sodium 136 135 - 145 mmol/L   Potassium 3.5 3.5 - 5.1 mmol/L   Chloride 109 98 - 111 mmol/L   CO2 22 22 - 32 mmol/L   Glucose, Bld 96 70 - 99 mg/dL    Comment: Glucose reference range applies only to samples taken after fasting for at least 8 hours.   BUN 8 8 - 23 mg/dL   Creatinine, Ser 4.25 (L) 0.44 - 1.00 mg/dL   Calcium  8.5 (L) 8.9 - 10.3 mg/dL   Total Protein 6.7 6.5 - 8.1 g/dL   Albumin 2.9 (L) 3.5 - 5.0 g/dL   AST 15 15 -  41 U/L   ALT 14 0  - 44 U/L   Alkaline Phosphatase 49 38 - 126 U/L   Total Bilirubin 0.4 0.0 - 1.2 mg/dL   GFR, Estimated >28 >41 mL/min    Comment: (NOTE) Calculated using the CKD-EPI Creatinine Equation (2021)    Anion gap 5 5 - 15    Comment: Performed at [facility], [address], [place], [place] [address]  Magnesium      Status: None   Collection Time: 11/14/23  5:00 AM  Result Value Ref Range   Magnesium  2.0 1.7 - 2.4 mg/dL    Comment: Performed at [facility], [address] [place], [place] [address]  Protime-INR     Status: None   Collection Time: 11/14/23  5:00 AM  Result Value Ref Range   Prothrombin Time 14.0 11.4 - 15.2 seconds   INR 1.1 0.8 - 1.2    Comment: (NOTE) INR goal varies based on device and disease states. Performed at [facility], [address] [place], [place] [address]   Glucose, capillary     Status: None   Collection Time: 11/14/23  5:46 AM  Result Value Ref Range   Glucose-Capillary 96 70 - 99 mg/dL    Comment: Glucose reference range applies only to samples taken after fasting for at least 8 hours.  Type and screen     Status: None   Collection Time: 11/14/23 10:25 AM  Result Value Ref Range   ABO/RH(D) A POS    Antibody Screen NEG    Sample Expiration      11/17/2023,2359 Performed at [facility], [address], [place], [place] [address]   Glucose, capillary     Status: None   Collection Time: 11/14/23 11:44 AM  Result Value Ref Range   Glucose-Capillary 86 70 - 99 mg/dL    Comment: Glucose reference range applies only to samples taken after fasting for at least 8 hours.    DG Chest Portable 1 View Result Date: 11/13/2023 CLINICAL DATA:  Projectile vomiting and chest pain EXAM: PORTABLE CHEST 1 VIEW COMPARISON:  11/11/2023 FINDINGS: Cardiac shadow is stable. Aortic calcifications are seen. Right chest wall port is noted in place. Lungs are hyperinflated. Persistent  spiculated mass is noted in the right base stable from the prior CT. No pneumothorax or sizable effusion is seen. IMPRESSION: Stable right lower lobe mass lesion. Hyperinflation consistent with COPD. Electronically Signed   By: Violeta Grey M.D.   On: 11/13/2023 23:34    Review of Systems negative except above Blood pressure 138/81, pulse 90, temperature 97.8 F (36.6 C), temperature source Temporal, resp. rate (!) 21, height 5\' 11"  (1.803 m), weight 43.1 kg, SpO2 96%. 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

## 2023-11-14 NOTE — Progress Notes (Signed)
 Courtesy note No billing  Patient is seen and examined today morning. She is admitted for evaluation of progressive dysphagia. She is sitting in bed. States pain improved with opiates.   She is awaiting for EGD and possible dilation. Continue NPO for now. Potassium improved with supplements. Continue to follow electrolytes. Follow GI recommendations post surgery. Further management pending GI work up. Discussed with patient, daughter at bedside. They understand and agree.

## 2023-11-14 NOTE — H&P (Signed)
 History and Physical      Kristy Salazar HYQ:[id number] DOB: 1941-03-06 DOA: 11/13/2023; DOS: 11/14/2023  PCP: [name], MD  Patient coming from: home   I have personally briefly reviewed patient's old medical records in [facility]  Chief Complaint: Difficulty swallowing  HPI: Kristy Salazar is a 83 y.o. female with medical history significant for stage IIIb non-small cell lung cancer status postradiation undergoing chemotherapy, COPD, who is admitted to [facility] on 11/13/2023 with progressive dysphagia after presenting from home to [facility] ED complaining of difficulty swallowing.   The patient has a history of stage IIIb non-small cell lung cancer associated with right lower lobe mass, status post radiation, complicated by radiation esophagitis/stenosis.  In this context, the patient was hospitalized in March 2025 with dysphagia.  Dr.S chooler of [facility] gastroenterology was consulted and has been following the patient, with plan for EGD with esophageal dilation scheduled for later in May 2025.   However, over the course of the last week, the patient has noted progression in her dysphagia noting progressive difficulty with consumption of solid foods, such that she is unable to swallow any solids over the last 2 days, noting that she vomits anytime she attempts to swallow solids.  while she continues to be able to swallow liquids, including over the last 48 hours, she has noted progressive difficulty in taking her home pills over that timeframe as well.  This has also been associate with odynophagia.  Otherwise, no recent chest pain or any shortness of breath.  Denies any recent subjective fever, chills, rigors, or generalized myalgias.  She is not on any blood thinners as an outpatient.  She notes that this progressive dysphagia over the course of the last week has occurred and spite of outpatient use of Protonix  as well as Carafate .  No recent melena or hematochezia.  Denies any  recent hematemesis.    ED Course:  Vital signs in the ED were notable for the following: Afebrile; heart to the 90s; systolic pressures in the 150s; respiratory rate 18, oxygen saturation 100% on room air.  Labs were notable for the following: CMP was notable for the following: Potassium 3.0, bicarbonate 25, BUN 10 compared to 15 on 10/24/2023, creatinine 0.57 compared to 0.61 10/24/2023, glucose 109, and liver enzymes were found to be within normal limits.  CBC notable for white blood cell count 7900, hemoglobin 14.4.  Per my interpretation, EKG in ED demonstrated the following: No EKG performed in the ED today.  Imaging in the ED, per corresponding formal radiology read, was notable for the following: 1 view chest x-ray showed no evidence of acute cardiopulmonary process will showing stable appearing right lower lobe mass as well as hyperinflation consistent with her known history of underlying COPD.  EDP contacted on-call [facility] gastroenterology, Dr. Honey Lusty, requesting formal consultation in the morning.  While in the ED, the following were administered: Liquid oxycodone  5 mg p.o. x 1 dose, Protonix  40 mg IV x 1 dose, normal saline x 500 cc bolus.  Subsequently, the patient was admitted for further evaluation management of presenting progressive dysphagia associated with odynophagia, with presenting labs also notable for hypokalemia.     Review of Systems: As per HPI otherwise 10 point review of systems negative.   Past Medical History:  Diagnosis Date   Arthritis    Asthma    COPD (chronic obstructive pulmonary disease) (HCC)    Diabetes mellitus    patient denies   Hypertension  Seasonal allergies     Past Surgical History:  Procedure Laterality Date   ABDOMINAL HYSTERECTOMY     APPENDECTOMY     BACK SURGERY     BRONCHIAL BIOPSY  05/17/2023   Procedure: BRONCHIAL BIOPSIES;  Surgeon: Denson Flake, MD;  Location: [facility] ENDOSCOPY;  Service: Pulmonary;;   BRONCHIAL BRUSHINGS   05/17/2023   Procedure: BRONCHIAL BRUSHINGS;  Surgeon: Denson Flake, MD;  Location: [facility] ENDOSCOPY;  Service: Pulmonary;;   BRONCHIAL NEEDLE ASPIRATION BIOPSY  05/17/2023   Procedure: BRONCHIAL NEEDLE ASPIRATION BIOPSIES;  Surgeon: Denson Flake, MD;  Location: [facility] ENDOSCOPY;  Service: Pulmonary;;   ESOPHAGOGASTRODUODENOSCOPY (EGD) WITH PROPOFOL  N/A 09/13/2023   Procedure: ESOPHAGOGASTRODUODENOSCOPY (EGD) WITH PROPOFOL ;  Surgeon: Renaye Carp, DO;  Location: [facility] ENDOSCOPY;  Service: Gastroenterology;  Laterality: N/A;   IR IMAGING GUIDED PORT INSERTION  06/03/2023   JOINT REPLACEMENT     TOTAL HIP ARTHROPLASTY     TOTAL KNEE ARTHROPLASTY     VIDEO BRONCHOSCOPY WITH ENDOBRONCHIAL ULTRASOUND Right 05/17/2023   Procedure: VIDEO BRONCHOSCOPY WITH ENDOBRONCHIAL ULTRASOUND;  Surgeon: Denson Flake, MD;  Location: [facility] ENDOSCOPY;  Service: Pulmonary;  Laterality: Right;    Social History:  reports that she has been smoking cigarettes. She has a 12.5 pack-year smoking history. She has never used smokeless tobacco. She reports that she does not drink alcohol and does not use drugs.   Allergies  Allergen Reactions   Erythromycin Hives    History reviewed. No pertinent family history.  Family history reviewed and not pertinent    Prior to Admission medications   Medication Sig Start Date End Date Taking? Authorizing Provider  albuterol  (VENTOLIN  HFA) 108 (90 Base) MCG/ACT inhaler Inhale 2 puffs into the lungs every 6 (six) hours as needed for wheezing or shortness of breath. 08/28/21   Sofia, Leslie K, PA-C  FARXIGA 10 MG TABS tablet Take 10 mg by mouth daily. 07/27/23   [provider]  Fluticasone-Umeclidin-Vilant 100-62.5-25 MCG/INH AEPB Inhale 2 application  into the lungs daily as needed.    [provider]  HYDROcodone -acetaminophen  (HYCET) 7.5-325 mg/15 ml solution Take 10 mLs by mouth 4 (four) times daily as needed for moderate pain (pain score 4-6). 07/15/23   Kenith Payer, MD  megestrol (MEGACE) 40 MG/ML suspension Take 40 mg by mouth daily. 08/31/23   [provider]  mirtazapine (REMERON) 7.5 MG tablet Take 7.5 mg by mouth at bedtime. 08/30/23   [provider]  montelukast  (SINGULAIR ) 10 MG tablet Take 10 mg by mouth daily. For seasonal allergies    [provider]  Multiple Vitamin (MULTIVITAMIN WITH MINERALS) TABS tablet Take 1 tablet by mouth daily.    [provider]  pantoprazole  (PROTONIX ) 40 MG tablet Take 1 tablet (40 mg total) by mouth daily. Follow up with gastroenterology for additional instructions 09/15/23 11/23/23  Etter Hermann., MD  potassium chloride  20 MEQ/15ML (10%) SOLN Take 15 mLs (20 mEq total) by mouth 2 (two) times daily. 09/27/23   Heilingoetter, Cassandra L, PA-C  prochlorperazine  (COMPAZINE ) 10 MG tablet TAKE 1 TABLET BY MOUTH EVERY 6 HOURS AS NEEDED FOR NAUSEA FOR VOMITING 08/30/23   Marlene Simas, MD  rosuvastatin  (CRESTOR ) 10 MG tablet Take 10 mg by mouth daily.      [provider]  sucralfate  (CARAFATE ) 1 g tablet Take 1 tablet (1 g total) by mouth 4 (four) times daily -  with meals and at bedtime. Crush 1 tablet in 1 oz water and  drink 5 min before meals for radiation induced esophagitis 07/04/23   Bettejane Brownie, PA-C  tiotropium (SPIRIVA ) 18 MCG inhalation capsule Place 18 mcg into inhaler and inhale daily.    [provider]     Objective    Physical Exam: Vitals:   11/13/23 2207 11/13/23 2213  BP:  (!) 159/92  Pulse: 98   Resp: 18   Temp: 98.9 F (37.2 C)   TempSrc: Oral   SpO2: 100%     General: appears to be stated age; alert, oriented Skin: warm, dry, no rash Head:  AT/[place] Mouth:  Oral mucosa membranes appear dry, normal dentition Neck: supple; trachea midline Chest: Right-sided Chemo-Port noted. Heart:  RRR; did not appreciate any M/R/G Lungs: CTAB, did not appreciate any wheezes, rales, or rhonchi Abdomen: + BS; soft, ND,  NT Vascular: 2+ pedal pulses b/l; 2+ radial pulses b/l Extremities: no peripheral edema, no muscle wasting Neuro: strength and sensation intact in upper and lower extremities b/l     Labs on Admission: I have personally reviewed following labs and imaging studies  CBC: Recent Labs  Lab 11/13/23 2322  WBC 7.9  NEUTROABS 6.4  HGB 14.4  HCT 43.8  MCV 95.8  PLT 276   Basic Metabolic Panel: Recent Labs  Lab 11/13/23 2322  NA 139  K 3.0*  CL 106  CO2 25  GLUCOSE 109*  BUN 10  CREATININE 0.57  CALCIUM  9.5   GFR: CrCl cannot be calculated (Unknown ideal weight.). Liver Function Tests: Recent Labs  Lab 11/13/23 2322  AST 20  ALT 18  ALKPHOS 62  BILITOT 0.7  PROT 8.0  ALBUMIN 3.5   No results for input(s): "LIPASE", "AMYLASE" in the last 168 hours. No results for input(s): "AMMONIA" in the last 168 hours. Coagulation Profile: No results for input(s): "INR", "PROTIME" in the last 168 hours. Cardiac Enzymes: No results for input(s): "CKTOTAL", "CKMB", "CKMBINDEX", "TROPONINI" in the last 168 hours. BNP (last 3 results) No results for input(s): "PROBNP" in the last 8760 hours. HbA1C: No results for input(s): "HGBA1C" in the last 72 hours. CBG: No results for input(s): "GLUCAP" in the last 168 hours. Lipid Profile: No results for input(s): "CHOL", "HDL", "LDLCALC", "TRIG", "CHOLHDL", "LDLDIRECT" in the last 72 hours. Thyroid  Function Tests: No results for input(s): "TSH", "T4TOTAL", "FREET4", "T3FREE", "THYROIDAB" in the last 72 hours. Anemia Panel: No results for input(s): "VITAMINB12", "FOLATE", "FERRITIN", "TIBC", "IRON", "RETICCTPCT" in the last 72 hours. Urine analysis:    Component Value Date/Time   COLORURINE YELLOW 09/08/2023 1954   APPEARANCEUR HAZY (A) 09/08/2023 1954   LABSPEC 1.022 09/08/2023 1954   PHURINE 6.0 09/08/2023 1954   GLUCOSEU >=500 (A) 09/08/2023 1954   HGBUR NEGATIVE 09/08/2023 1954   BILIRUBINUR NEGATIVE 09/08/2023 1954    KETONESUR 20 (A) 09/08/2023 1954   PROTEINUR NEGATIVE 09/08/2023 1954   UROBILINOGEN 0.2 05/10/2011 0930   NITRITE NEGATIVE 09/08/2023 1954   LEUKOCYTESUR TRACE (A) 09/08/2023 1954    Radiological Exams on Admission: DG Chest Portable 1 View Result Date: 11/13/2023 CLINICAL DATA:  Projectile vomiting and chest pain EXAM: PORTABLE CHEST 1 VIEW COMPARISON:  11/11/2023 FINDINGS: Cardiac shadow is stable. Aortic calcifications are seen. Right chest wall port is noted in place. Lungs are hyperinflated. Persistent spiculated mass is noted in the right base stable from the prior CT. No pneumothorax or sizable effusion is seen. IMPRESSION: Stable right lower lobe mass lesion. Hyperinflation consistent with COPD. Electronically Signed   By: Regenia Cape.D.  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  Continues to be able to swallow liquids.  No clinical or radiographic evidence to suggest aspiration pneumonia.  She follows with Dr. Honey Lusty of [facility] gastroenterology, and was scheduled to undergo EGD with esophageal dilation later this month.  Is on Protonix  as well as Carafate  as an outpatient.  No evidence of associated acute upper GI bleed at this time, noting interval decline in BUN, in the absence of any report of recent melena or hematochezia, will noting stable presenting hemoglobin level.  EDP contacted on-call [facility] gastroenterology, Dr. Honey Lusty, requesting formal consultation in the morning.  No evidence to  suggest acutely decompensated heart failure or acute MI. Consequently, no absolute contraindications to proceeding with potential EGD/esophageal dilation at this time.  Plan: Keep n.p.o. for now.  He will gastroenterology to formally consult in the morning, as above.  Normal saline at 75 cc/h x 10 hours.  Prn IV fentanyl .  Protonix  40 mg IV twice daily.  Did not screen ordered.  Check INR, preoperative EKG.  Repeat CMP, CBC in the morning.                  #) Hypokalemia: presenting potassium level noted to be 3.0, with contributions from decline in oral intake over the course the last week as a consequence of progressive dysphagia over that timeframe.  As the patient is currently n.p.o., will pursue IV supplementation of her potassium level.  Plan: monitor on tele. KCl 40 meq IV over 4 hours. Add-on serum mag level. CMP, mag level in the AM.                     #) Non-small cell lung cancer: Stage IIIb, associated with mass in the right lower lobe of the lung.  Status post radiation, undergoing chemotherapy.  Follows with Dr. Marguerita Shih  as her outpatient oncologist, with most recent outpatient appointment occurring on 10/24/2023.  Plan: I have added to her outpatient oncologist, Dr. Marguerita Shih, to the treatment team starting at 7 AM on 11/14/2023.                   #) COPD: Documented history thereof, without clinical evidence of acute exacerbation at this time.  Outpatient respiratory regimen includes the following: Scheduled Spiriva  as well as as needed albuterol .   Plan: cont outpatient Spiriva . Prn albuterol  nebulizer. Check CMP and serum magnesium  level in the AM.                       #) Type 2 Diabetes Mellitus: documented history of such. Home insulin  regimen:  none. Home oral hypoglycemic agents: Farxiga. presenting blood sugar: 109. Most recent A1c noted to be 5.3% when checked on 09/09/2023.  Plan: In the setting of current  n.p.o. status, will pursue Accu-Cheks with associated low-dose sliding scale insulin  on a every 6 hour basis. hold home oral hypoglycemic agents during this hospitalization.  DVT prophylaxis: SCD's   Code Status: Full code Family Communication: I discussed the patient's case with her daughter, who is present at bedside Disposition Plan: Per Rounding Team Consults called:  I have added to her outpatient oncologist, Dr. Marguerita Shih, to the treatment team starting at 7 AM on 11/14/2023.EDP contacted on-call [facility], Dr. Honey Lusty, requesting formal consult in the morning;  Admission status: Observation    I SPENT GREATER THAN 75  MINUTES IN CLINICAL CARE TIME/MEDICAL DECISION-MAKING IN COMPLETING THIS ADMISSION.     Neveah Bang B Audryna Wendt DO [facility] From 7PM - 7AM   11/14/2023, 12:13 AM

## 2023-11-14 NOTE — Plan of Care (Signed)

## 2023-11-14 NOTE — Anesthesia Preprocedure Evaluation (Addendum)
 Anesthesia Evaluation  Patient identified by MRN, date of birth, ID band Patient awake    Reviewed: Allergy & Precautions, NPO status , Patient's Chart, lab work & pertinent test results  History of Anesthesia Complications Negative for: history of anesthetic complications  Airway Mallampati: II  TM Distance: >3 FB Neck ROM: Full    Dental  (+) Dental Advisory Given, Teeth Intact   Pulmonary asthma , pneumonia, COPD,  COPD inhaler, Current Smoker and Patient abstained from smoking.  Lung cancer    Pulmonary exam normal breath sounds clear to auscultation       Cardiovascular hypertension, Pt. on medications Normal cardiovascular exam Rhythm:Regular Rate:Normal     Neuro/Psych negative neurological ROS  negative psych ROS   GI/Hepatic Neg liver ROS,GERD  Medicated,, Dysphagia    Endo/Other  diabetes     Renal/GU negative Renal ROS     Musculoskeletal  (+) Arthritis ,    Abdominal   Peds  Hematology negative hematology ROS (+) Blood dyscrasia, anemia   Anesthesia Other Findings   Reproductive/Obstetrics                             Anesthesia Physical Anesthesia Plan  ASA: 3  Anesthesia Plan: MAC   Post-op Pain Management: Minimal or no pain anticipated   Induction: Intravenous  PONV Risk Score and Plan: 2 and Propofol  infusion and Treatment may vary due to age or medical condition  Airway Management Planned: Nasal Cannula and Natural Airway  Additional Equipment: None  Intra-op Plan:   Post-operative Plan:   Informed Consent: I have reviewed the patients History and Physical, chart, labs and discussed the procedure including the risks, benefits and alternatives for the proposed anesthesia with the patient or authorized representative who has indicated his/her understanding and acceptance.     Dental advisory given  Plan Discussed with: CRNA  Anesthesia Plan  Comments:        Anesthesia Quick Evaluation

## 2023-11-14 NOTE — Anesthesia Postprocedure Evaluation (Signed)
 Anesthesia Post Note  Patient: Kristy Salazar  Procedure(s) Performed: EGD (ESOPHAGOGASTRODUODENOSCOPY) Balloon dilation wire-guided     Patient location during evaluation: PACU Anesthesia Type: MAC Level of consciousness: awake and alert Pain management: pain level controlled Vital Signs Assessment: post-procedure vital signs reviewed and stable Respiratory status: spontaneous breathing Cardiovascular status: stable Anesthetic complications: no   No notable events documented.  Last Vitals:  Vitals:   11/14/23 1500 11/14/23 1510  BP: (!) 157/90 136/76  Pulse: 96 87  Resp: 18 20  Temp:    SpO2: 96% 93%    Last Pain:  Vitals:   11/14/23 1510  TempSrc:   PainSc: 0-No pain                 Gorman Laughter

## 2023-11-14 NOTE — ED Notes (Signed)
 Port accessed

## 2023-11-14 NOTE — Care Management Obs Status (Signed)
 MEDICARE OBSERVATION STATUS NOTIFICATION   Patient Details  Name: Kristy Salazar MRN: [id number] Date of Birth: [birth year]   Medicare Observation Status Notification Given:  Yes    Loreda Rodriguez, RN 11/14/2023, 4:21 PM

## 2023-11-14 NOTE — Op Note (Signed)
 [facility] Patient Name: Kristy Salazar Procedure Date: 11/14/2023 MRN: [id number] Attending MD: Ozell Blunt , MD, [id number] Date of Birth: 03/20/1941 CSN: [id number] Age: 83 Admit Type: Inpatient Procedure:                Upper GI endoscopy Indications:              Dysphagia history of radiation stricture Providers:                Ozell Blunt, MD, Ambrose Junk, RN, Tyrus Gallus,                            Technician Referring MD:              Medicines:                Monitored Anesthesia Care Complications:            No immediate complications. Estimated Blood Loss:     Estimated blood loss was minimal. Procedure:                Pre-Anesthesia Assessment:                           - Prior to the procedure, a History and Physical                            was performed, and patient medications and                            allergies were reviewed. The patient's tolerance of                            previous anesthesia was also reviewed. The risks                            and benefits of the procedure and the sedation                            options and risks were discussed with the patient.                            All questions were answered, and informed consent                            was obtained. Prior Anticoagulants: The patient has                            taken no anticoagulant or antiplatelet agents. ASA                            Grade Assessment: III - A patient with severe                            systemic disease. After reviewing the risks and  benefits, the patient was deemed in satisfactory                            condition to undergo the procedure.                           After obtaining informed consent, the endoscope was                            passed under direct vision. Throughout the                            procedure, the patient's blood pressure, pulse, and                            oxygen  saturations were monitored continuously. The                            [id number]-[id number] ([id number]) Olympus endoscope was introduced                            through the mouth, and advanced to the third part                            of duodenum. The upper GI endoscopy was                            accomplished without difficulty. The patient                            tolerated the procedure well. Scope In: Scope Out: Findings:      The larynx was normal.      One benign-appearing, intrinsic severe (stenosis; an endoscope cannot       pass) stenosis was found. This stenosis measured approximately 4 cm (in       length). The stenosis was traversed after dilation to 10 mm. A TTS       dilator was passed through the scope. Dilation with an 02-17-09 mm balloon       and a 04-22-11 mm balloon dilator was performed to 12 mm. The dilation       site was examined and showed mild mucosal disruption and mild       improvement in luminal narrowing. After dilating to 10 we were able to       easily pass the scope and there was only minimal came and disruption of       the stricture so we elected to proceed with the 11 and 12 mm dilation       and the area was evaluated afterward      The entire examined stomach was normal.      The ampulla, duodenal bulb, first portion of the duodenum, second       portion of the duodenum, major papilla, area of the papilla and third       portion of the duodenum were normal.      The cardia and gastric fundus were normal on retroflexion. Impression:               -  Normal larynx.                           - Benign-appearing esophageal stenosis. Dilated.                           - Normal stomach.                           - Normal ampulla, duodenal bulb, first portion of                            the duodenum, second portion of the duodenum, major                            papilla, area of the papilla and third portion of                            the duodenum.                            - No specimens collected. Moderate Sedation:      Not Applicable - Patient had care per Anesthesia. Recommendation:           - Clear liquid diet for 3 hours. If doing well may                            have full liquids this evening and hopefully can go                            home soon patient time with my partner Dr. Honey Lusty                            which is May 20                           - Continue present medications.                           - Return to GI clinic PRN.                           - Telephone GI clinic if symptomatic PRN.                           - Repeat upper endoscopy To see if more aggressive                            dilation is needed in 2 weeks at her scheduled                            outpati. Procedure Code(s):        --- Professional ---                           [id number],  Esophagogastroduodenoscopy, flexible,                            transoral; with transendoscopic balloon dilation of                            esophagus (less than 30 mm diameter) Diagnosis Code(s):        --- Professional ---                           K22.2, Esophageal obstruction                           R13.10, Dysphagia, unspecified CPT copyright 2022 [employer]. All rights reserved. The codes documented in this report are preliminary and upon coder review may  be revised to meet current compliance requirements. Ozell Blunt, MD 11/14/2023 3:00:36 PM This report has been signed electronically. Number of Addenda: 0

## 2023-11-14 NOTE — Progress Notes (Signed)
 Kristy Salazar   DOB:04/26/1941   ZO#:[id number]      ASSESSMENT & PLAN:  Non-small cell lung cancer, squamous cell carcinoma, stage IIIb  (T4, N2, M0) - Diagnosed September 2024, presented with large 6.8 x 5.8 cm posterior RLL lung mass which was compatible with primary bronchogenic carcinoma.  Bronchoscopy later done 05/17/2023.  PET scan done 05/19/2023 consistent with primary bronchogenic carcinoma.  No evidence of distant mets. - Status post chemo/radiation with weekly carbo and paclitaxel , status post 6 cycles. - Currently receiving consolidation therapy with Imfinzi  every 4 weeks, started first dose 08/29/2023.  Received cycle 3 on 10/24/2023.  No planned inpatient chemo or immunotherapy this admission. - Patient had outpatient CT of chest done 11/11/2023, results remain pending. -Medical oncology/Dr. Marguerita Shih following closely.  Dysphagia, progressive Odynophagia Esophagitis - Patient previously hospitalized in March 2025 with dysphagia. - CT of the chest done 09/09/2023 showed findings consistent with esophagitis, possibly radiation induced. - Was seen by GI/Dr. Honey Lusty with plan for outpatient EGD with esophageal dilation scheduled 11/29/2023 per patient. - Unfortunately during the last week patient has noted progressive difficulty swallowing with an inability to swallow anything over the last 2 days.  Also complains of midsternal area discomfort.  No complaints of shortness of breath or chest pain. - Likely due to radiation therapy -Recommend GI eval, and consideration for moving up planned EGD and dilation during this hospitalization.  COPD - Stable - Continue medications as ordered - Continue supportive care    Code Status Full  Subjective:  Patient seen awake alert and oriented x3 laying supine in bed.  Multiple family members at bedside.  Reports discomfort in mid-sternal area when she swallows, no other acute distress reported or noted.   Objective:  Vitals:   11/13/23  2213 11/14/23 0755  BP: (!) 159/92   Pulse:    Resp:    Temp:    SpO2:  93%     Intake/Output Summary (Last 24 hours) at 11/14/2023 1134 Last data filed at 11/14/2023 0745 Gross per 24 hour  Intake 628.19 ml  Output --  Net 628.19 ml     PHYSICAL EXAMINATION: ECOG PERFORMANCE STATUS: 2 - Symptomatic, <50% confined to bed  Vitals:   11/13/23 2213 11/14/23 0755  BP: (!) 159/92   Pulse:    Resp:    Temp:    SpO2:  93%   Filed Weights   11/14/23 0108  Weight: 95 lb (43.1 kg)    GENERAL: alert, no distress and comfortable SKIN: skin color, texture, turgor are normal, no rashes or significant lesions EYES: normal, conjunctiva are pink and non-injected, sclera clear OROPHARYNX: no exudate, no erythema and lips, buccal mucosa, and tongue normal  NECK: supple, thyroid  normal size, non-tender, without nodularity LYMPH: no palpable lymphadenopathy in the cervical, axillary or inguinal LUNGS: clear to auscultation and percussion with normal breathing effort HEART: regular rate & rhythm and no murmurs and no lower extremity edema ABDOMEN: abdomen soft, non-tender and normal bowel sounds MUSCULOSKELETAL: no cyanosis of digits and no clubbing  PSYCH: alert & oriented x 3 with fluent speech NEURO: no focal motor/sensory deficits   All questions were answered. The patient knows to call the clinic with any problems, questions or concerns.   The total time spent in the appointment was 40 minutes encounter with patient including review of chart and various tests results, discussions about plan of care and coordination of care plan  Jacqualin Mate, NP 11/14/2023 11:34 AM    Labs  Reviewed:  Lab Results  Component Value Date   WBC 7.6 11/14/2023   HGB 12.1 11/14/2023   HCT 36.6 11/14/2023   MCV 96.3 11/14/2023   PLT 246 11/14/2023   Recent Labs    10/24/23 1029 11/13/23 2322 11/14/23 0500  NA 136 139 136  K 3.4* 3.0* 3.5  CL 106 106 109  CO2 24 25 22   GLUCOSE 119* 109* 96   BUN 15 10 8   CREATININE 0.62 0.57 0.39*  CALCIUM  9.5 9.5 8.5*  GFRNONAA >60 >60 >60  PROT 7.4 8.0 6.7  ALBUMIN 3.8 3.5 2.9*  AST 21 20 15   ALT 25 18 14   ALKPHOS 49 62 49  BILITOT 0.3 0.7 0.4    Studies Reviewed:  DG Chest Portable 1 View Result Date: 11/13/2023 CLINICAL DATA:  Projectile vomiting and chest pain EXAM: PORTABLE CHEST 1 VIEW COMPARISON:  11/11/2023 FINDINGS: Cardiac shadow is stable. Aortic calcifications are seen. Right chest wall port is noted in place. Lungs are hyperinflated. Persistent spiculated mass is noted in the right base stable from the prior CT. No pneumothorax or sizable effusion is seen. IMPRESSION: Stable right lower lobe mass lesion. Hyperinflation consistent with COPD. Electronically Signed   By: Violeta Grey M.D.   On: 11/13/2023 23:34

## 2023-11-14 NOTE — Transfer of Care (Signed)
 Immediate Anesthesia Transfer of Care Note  Patient: Kristy Salazar  Procedure(s) Performed: EGD (ESOPHAGOGASTRODUODENOSCOPY) Balloon dilation wire-guided  Patient Location: PACU  Anesthesia Type:MAC  Level of Consciousness: drowsy  Airway & Oxygen Therapy: Patient Spontanous Breathing  Post-op Assessment: Report given to RN  Post vital signs: Reviewed and stable  Last Vitals:  Vitals Value Taken Time  BP 114/76 11/14/23 1444  Temp    Pulse 105 11/14/23 1444  Resp 27 11/14/23 1444  SpO2 99 % 11/14/23 1444  Vitals shown include unfiled device data.  Last Pain:  Vitals:   11/14/23 1357  TempSrc: Temporal  PainSc: 5          Complications: No notable events documented.

## 2023-11-15 ENCOUNTER — Other Ambulatory Visit (HOSPITAL_COMMUNITY): Payer: Self-pay

## 2023-11-15 DIAGNOSIS — E876 Hypokalemia: Secondary | ICD-10-CM | POA: Diagnosis not present

## 2023-11-15 DIAGNOSIS — C3431 Malignant neoplasm of lower lobe, right bronchus or lung: Secondary | ICD-10-CM | POA: Diagnosis not present

## 2023-11-15 DIAGNOSIS — R1319 Other dysphagia: Secondary | ICD-10-CM | POA: Diagnosis not present

## 2023-11-15 DIAGNOSIS — R1310 Dysphagia, unspecified: Secondary | ICD-10-CM | POA: Diagnosis not present

## 2023-11-15 DIAGNOSIS — Z8709 Personal history of other diseases of the respiratory system: Secondary | ICD-10-CM | POA: Diagnosis not present

## 2023-11-15 DIAGNOSIS — E119 Type 2 diabetes mellitus without complications: Secondary | ICD-10-CM | POA: Diagnosis not present

## 2023-11-15 LAB — GLUCOSE, CAPILLARY
Glucose-Capillary: 106 mg/dL — ABNORMAL HIGH (ref 70–99)
Glucose-Capillary: 144 mg/dL — ABNORMAL HIGH (ref 70–99)

## 2023-11-15 MED ORDER — OXYCODONE HCL 5 MG PO TABS: 5.0000 mg | ORAL_TABLET | Freq: Four times a day (QID) | ORAL | Status: DC | PRN

## 2023-11-15 MED ORDER — OXYCODONE HCL 5 MG PO TABS
5.0000 mg | ORAL_TABLET | Freq: Four times a day (QID) | ORAL | 0 refills | Status: AC | PRN
  Filled 2023-11-15: qty 20, 5d supply, fill #0

## 2023-11-15 MED ORDER — ENSURE ENLIVE PO LIQD: 237.0000 mL | Freq: Two times a day (BID) | ORAL | Status: DC

## 2023-11-15 NOTE — Plan of Care (Signed)
  Problem: Clinical Measurements: Goal: Ability to maintain clinical measurements within normal limits will improve Outcome: Progressing Goal: Will remain free from infection Outcome: Progressing Goal: Diagnostic test results will improve Outcome: Progressing Goal: Respiratory complications will improve Outcome: Progressing Goal: Cardiovascular complication will be avoided Outcome: Progressing   Problem: Activity: Goal: Risk for activity intolerance will decrease Outcome: Progressing   Problem: Nutrition: Goal: Adequate nutrition will be maintained Outcome: Progressing   Problem: Coping: Goal: Level of anxiety will decrease Outcome: Progressing   Problem: Elimination: Goal: Will not experience complications related to bowel motility Outcome: Progressing

## 2023-11-15 NOTE — Evaluation (Signed)
 Physical Therapy Evaluation Patient Details Name: Kristy Salazar MRN: [id number] DOB: [birth year] Today's Date: 11/15/2023  History of Present Illness  Kristy Salazar is a 83 y.o. female admitted 11/13/23 with medical history significant for stage IIIb non-small cell lung cancer status postradiation undergoing chemotherapy, COPD, who is admitted to [facility] on 11/13/2023 with progressive dysphagia after presenting from home to [facility] ED complaining of difficulty swallowing  Clinical Impression  Pt admitted with above diagnosis. Pt mod I for bed mobility and transfers, uses 2WW for addded safety and stability in standing, able to complete amb at sup A and maintains for stair negotiation. Able to ascend and descend 9 steps with reciprocal pattern. Pt currently with functional limitations due to the deficits listed below (see PT Problem List). Pt will benefit from acute skilled PT to increase their independence and safety with mobility to allow discharge.           If plan is discharge home, recommend the following: A little help with walking and/or transfers;A little help with bathing/dressing/bathroom;Assistance with cooking/housework;Assist for transportation;Help with stairs or ramp for entrance   Can travel by private vehicle        Equipment Recommendations None recommended by PT  Recommendations for Other Services       Functional Status Assessment Patient has had a recent decline in their functional status and demonstrates the ability to make significant improvements in function in a reasonable and predictable amount of time.     Precautions / Restrictions Precautions Precautions: Fall Recall of Precautions/Restrictions: Intact Restrictions Weight Bearing Restrictions Per Provider Order: No      Mobility  Bed Mobility Overal bed mobility: Modified Independent             General bed mobility comments: inc time    Transfers Overall transfer level: Modified  independent Equipment used: Rolling walker (2 wheels)                    Ambulation/Gait Ambulation/Gait assistance: Supervision Gait Distance (Feet): 120 Feet Assistive device: Rolling walker (2 wheels) Gait Pattern/deviations: Step-through pattern, Trunk flexed, Wide base of support, Decreased stride length Gait velocity: dec     General Gait Details: Pt has forward flexed trunk, even with walker height adjusted, has symmetrical steps with 2WW, reports intermittent use of 4WW for community mobility, educated on safest AD depending on day  Stairs Stairs: Yes   Stair Management: Two rails, Alternating pattern Number of Stairs: 9 General stair comments: reciprocal pattern, BUE use, good control with each step transfer  Wheelchair Mobility     Tilt Bed    Modified Rankin (Stroke Patients Only)       Balance Overall balance assessment: Needs assistance Sitting-balance support: Feet supported, No upper extremity supported Sitting balance-Leahy Scale: Good     Standing balance support: Reliant on assistive device for balance, No upper extremity supported Standing balance-Leahy Scale: Good                               Pertinent Vitals/Pain Pain Assessment Pain Assessment: No/denies pain (premedicated)    Home Living Family/patient expects to be discharged to:: Private residence Living Arrangements: Children Available Help at Discharge: Family Type of Home: House Home Access: Stairs to enter Entrance Stairs-Rails: Right;Left;Can reach both Entrance Stairs-Number of Steps: 6   Home Layout: One level Home Equipment: Cane - single Librarian, academic (2 wheels);Rollator (4 wheels);Grab bars - tub/shower;Tub  bench      Prior Function Prior Level of Function : Independent/Modified Independent             Mobility Comments: uses walker       Extremity/Trunk Assessment   Upper Extremity Assessment Upper Extremity Assessment: Overall WFL  for tasks assessed    Lower Extremity Assessment Lower Extremity Assessment: Generalized weakness       Communication   Communication Communication: No apparent difficulties    Cognition Arousal: Alert Behavior During Therapy: WFL for tasks assessed/performed   PT - Cognitive impairments: No apparent impairments                         Following commands: Intact       Cueing Cueing Techniques: Verbal cues, Visual cues, Gestural cues     General Comments      Exercises     Assessment/Plan    PT Assessment Patient needs continued PT services  PT Problem List Decreased strength;Decreased balance;Decreased mobility       PT Treatment Interventions DME instruction;Functional mobility training;Balance training;Patient/family education;Gait training;Therapeutic activities;Stair training;Therapeutic exercise    PT Goals (Current goals can be found in the Care Plan section)  Acute Rehab PT Goals Patient Stated Goal: get back home PT Goal Formulation: With patient Time For Goal Achievement: 04/06/24 Potential to Achieve Goals: Good    Frequency Min 2X/week     Co-evaluation               AM-PAC PT "6 Clicks" Mobility  Outcome Measure Help needed turning from your back to your side while in a flat bed without using bedrails?: None Help needed moving from lying on your back to sitting on the side of a flat bed without using bedrails?: None Help needed moving to and from a bed to a chair (including a wheelchair)?: None Help needed standing up from a chair using your arms (e.g., wheelchair or bedside chair)?: None Help needed to walk in hospital room?: None Help needed climbing 3-5 steps with a railing? : A Little 6 Click Score: 23    End of Session Equipment Utilized During Treatment: Gait belt Activity Tolerance: Patient tolerated treatment well;No increased pain Patient left: in bed;with call bell/phone within reach;with family/visitor  present Nurse Communication: Mobility status PT Visit Diagnosis: Difficulty in walking, not elsewhere classified (R26.2)    Time: 1130-1145 PT Time Calculation (min) (ACUTE ONLY): 15 min   Charges:   PT Evaluation $PT Eval Low Complexity: 1 Low   PT General Charges $$ ACUTE PT VISIT: 1 Visit         Darien Eden, PT Acute Rehabilitation Services Office: [phone] 11/15/2023   Serafin Dames 11/15/2023, 12:01 PM

## 2023-11-15 NOTE — Discharge Summary (Signed)
 Physician Discharge Summary   Patient: Kristy Salazar MRN: [id number] DOB: 1941/07/10  Admit date:     11/13/2023  Discharge date: {dischdate:26783}  Discharge Physician: Aisha Hove   PCP: [name], MD   Recommendations at discharge:  {Tip this will not be part of the note when signed- Example include specific recommendations for outpatient follow-up, pending tests to follow-up on. 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 [facility] Course: No notes on file  Assessment and Plan: No notes have been filed under this hospital service. Service: Hospitalist     {Tip this will not be part of the note when signed Body mass index is 13.34 kg/m. , ,  (Optional):26781}  {(NOTE) Pain control PDMP Statment (Optional):26782} Consultants: *** Procedures performed: ***  Disposition: {Plan; Disposition:26390} Diet recommendation:  Discharge Diet Orders (From admission, onward)     Start     Ordered   11/15/23 0000  Diet - low sodium heart healthy       Comments: Bland, soft diet for next few days.   11/15/23 1056           {Diet_Plan:26776} DISCHARGE MEDICATION: Allergies as of 11/15/2023       Reactions   Erythromycin Hives        Medication List     STOP taking these medications    HYDROcodone -acetaminophen  7.5-325 mg/15 ml solution Commonly known as: HYCET       TAKE these medications    albuterol  108 (90 Base) MCG/ACT inhaler Commonly known as: VENTOLIN  HFA Inhale 2 puffs into the lungs every 6 (six) hours as needed for wheezing or shortness of breath.   Farxiga 10 MG Tabs tablet Generic drug: dapagliflozin propanediol Take 10 mg by mouth daily.   fluconazole  100 MG  tablet Commonly known as: DIFLUCAN  Take 100 mg by mouth daily.   Fluticasone-Umeclidin-Vilant 100-62.5-25 MCG/INH Aepb Inhale 2 application  into the lungs daily as needed.   megestrol 40 MG/ML suspension Commonly known as: MEGACE Take 40 mg by mouth daily.   mirtazapine 7.5 MG tablet Commonly known as: REMERON Take 7.5 mg by mouth at bedtime.   montelukast  10 MG tablet Commonly known as: SINGULAIR  Take 10 mg by mouth daily. For seasonal allergies   multivitamin with minerals Tabs tablet Take 1 tablet by mouth daily.   oxyCODONE  5 MG immediate release tablet Commonly known as: Oxy IR/ROXICODONE  Take 1 tablet (5 mg total) by mouth every 6 (six) hours as needed for up to 5 days for severe pain (pain score 7-10).   pantoprazole  40 MG tablet Commonly known as: Protonix  Take 1 tablet (40 mg total) by mouth daily. Follow up with gastroenterology for additional instructions   potassium chloride  20 MEQ/15ML (10%) Soln Take 15 mLs (20 mEq total) by mouth 2 (two) times daily.   Klor-Con  20 MEQ packet Generic drug: potassium chloride  Take 20 mEq by mouth daily.   prochlorperazine  10 MG tablet Commonly known as: COMPAZINE  TAKE 1 TABLET BY MOUTH EVERY 6 HOURS AS NEEDED FOR NAUSEA FOR VOMITING   rosuvastatin  10 MG tablet Commonly known as: CRESTOR  Take 10 mg by mouth daily.   sucralfate  1 g tablet Commonly known as: Carafate  Take 1 tablet (1 g  total) by mouth 4 (four) times daily -  with meals and at bedtime. Crush 1 tablet in 1 oz water and drink 5 min before meals for radiation induced esophagitis   tiotropium 18 MCG inhalation capsule Commonly known as: SPIRIVA  Place 18 mcg into inhaler and inhale daily.        Discharge Exam: Filed Weights   11/14/23 0108 11/14/23 1357 11/15/23 0500  Weight: 43.1 kg 43.1 kg 43.4 kg   ***  Condition at discharge: {DC Condition:26389}  The results of significant diagnostics from this hospitalization (including imaging,  microbiology, ancillary and laboratory) are listed below for reference.   Imaging Studies: DG Chest Portable 1 View Result Date: 11/13/2023 CLINICAL DATA:  Projectile vomiting and chest pain EXAM: PORTABLE CHEST 1 VIEW COMPARISON:  11/11/2023 FINDINGS: Cardiac shadow is stable. Aortic calcifications are seen. Right chest wall port is noted in place. Lungs are hyperinflated. Persistent spiculated mass is noted in the right base stable from the prior CT. No pneumothorax or sizable effusion is seen. IMPRESSION: Stable right lower lobe mass lesion. Hyperinflation consistent with COPD. Electronically Signed   By: Violeta Grey M.D.   On: 11/13/2023 23:34    Microbiology: Results for orders placed or performed during the hospital encounter of 09/08/23  SARS Coronavirus 2 by RT PCR (hospital order, performed in [facility] hospital lab) *cepheid single result test* Nasopharyngeal Swab     Status: None   Collection Time: 09/09/23  6:57 AM   Specimen: Nasopharyngeal Swab; Nasal Swab  Result Value Ref Range Status   SARS Coronavirus 2 by RT PCR NEGATIVE NEGATIVE Final    Comment: (NOTE) SARS-CoV-2 target nucleic acids are NOT DETECTED.  The SARS-CoV-2 RNA is generally detectable in upper and lower respiratory specimens during the acute phase of infection. The lowest concentration of SARS-CoV-2 viral copies this assay can detect is 250 copies / mL. A negative result does not preclude SARS-CoV-2 infection and should not be used as the sole basis for treatment or other patient management decisions.  A negative result may occur with improper specimen collection / handling, submission of specimen other than nasopharyngeal swab, presence of viral mutation(s) within the areas targeted by this assay, and inadequate number of viral copies (<250 copies / mL). A negative result must be combined with clinical observations, patient history, and epidemiological information.  Fact Sheet for Patients:    RoadLapTop.co.za  Fact Sheet for Healthcare Providers: http://kim-miller.com/  This test is not yet approved or  cleared by the [place]  FDA and has been authorized for detection and/or diagnosis of SARS-CoV-2 by FDA under an Emergency Use Authorization (EUA).  This EUA will remain in effect (meaning this test can be used) for the duration of the COVID-19 declaration under Section 564(b)(1) of the Act, 21 U.S.C. section 360bbb-3(b)(1), unless the authorization is terminated or revoked sooner.  Performed at [facility], [address], [place], [place] [address]   Respiratory (~20 pathogens) panel by PCR     Status: None   Collection Time: 09/09/23  6:57 AM   Specimen: Nasopharyngeal Swab; Respiratory  Result Value Ref Range Status   Adenovirus NOT DETECTED NOT DETECTED Final   Coronavirus 229E NOT DETECTED NOT DETECTED Final    Comment: (NOTE) The Coronavirus on the Respiratory Panel, DOES NOT test for the novel  Coronavirus (2019 nCoV)    Coronavirus HKU1 NOT DETECTED NOT DETECTED Final   Coronavirus NL63 NOT DETECTED NOT DETECTED Final   Coronavirus OC43 NOT DETECTED NOT  DETECTED Final   Metapneumovirus NOT DETECTED NOT DETECTED Final   Rhinovirus / Enterovirus NOT DETECTED NOT DETECTED Final   Influenza A NOT DETECTED NOT DETECTED Final   Influenza B NOT DETECTED NOT DETECTED Final   Parainfluenza Virus 1 NOT DETECTED NOT DETECTED Final   Parainfluenza Virus 2 NOT DETECTED NOT DETECTED Final   Parainfluenza Virus 3 NOT DETECTED NOT DETECTED Final   Parainfluenza Virus 4 NOT DETECTED NOT DETECTED Final   Respiratory Syncytial Virus NOT DETECTED NOT DETECTED Final   Bordetella pertussis NOT DETECTED NOT DETECTED Final   Bordetella Parapertussis NOT DETECTED NOT DETECTED Final   Chlamydophila pneumoniae NOT DETECTED NOT DETECTED Final   Mycoplasma pneumoniae NOT DETECTED NOT DETECTED Final    Comment:  Performed at [facility], [address] [place], [place] [address]  Culture, blood (Routine X 2) w Reflex to ID Panel     Status: Abnormal   Collection Time: 09/09/23  7:48 AM   Specimen: BLOOD  Result Value Ref Range Status   Specimen Description   Final    BLOOD SITE NOT SPECIFIED Performed at [facility], [address], [place], [place] [address]    Special Requests   Final    BOTTLES DRAWN AEROBIC AND ANAEROBIC Blood Culture adequate volume Performed at [facility], [address], [place], [place] [address]    Culture  Setup Time   Final    GRAM POSITIVE RODS ANAEROBIC BOTTLE ONLY CRITICAL RESULT CALLED TO, READ BACK BY AND VERIFIED WITH: PHARMD Bailey Lesser [id number] @ 2308 FH    Culture (A)  Final    BACILLUS SPECIES Standardized susceptibility testing for this organism is not available. Performed at [facility], [address] [place], [place] [address]    Report Status 09/14/2023 FINAL  Final  Culture, blood (Routine X 2) w Reflex to ID Panel     Status: None   Collection Time: 09/09/23  3:40 PM   Specimen: BLOOD RIGHT ARM  Result Value Ref Range Status   Specimen Description   Final    BLOOD RIGHT ARM Performed at [facility], [address] [place], [place] [address]    Special Requests   Final    BOTTLES DRAWN AEROBIC AND ANAEROBIC Blood Culture results may not be optimal due to an inadequate volume of blood received in culture bottles Performed at [facility], [address], [place], [place] [address]    Culture   Final    NO GROWTH 5 DAYS Performed at [facility], [address] [place], [place] [address]    Report Status 09/14/2023 FINAL  Final    Labs: CBC: Recent Labs  Lab 11/13/23 2322 11/14/23 0500  WBC 7.9 7.6  NEUTROABS 6.4 6.4  HGB 14.4 12.1  HCT 43.8 36.6  MCV 95.8 96.3  PLT 276 246   Basic Metabolic Panel: Recent Labs  Lab 11/13/23 2322  11/14/23 0500  NA 139 136  K 3.0* 3.5  CL 106 109  CO2 25 22  GLUCOSE 109* 96  BUN 10 8  CREATININE 0.57 0.39*  CALCIUM  9.5 8.5*  MG  --  2.0   Liver Function Tests: Recent Labs  Lab 11/13/23 2322 11/14/23 0500  AST 20 15  ALT 18 14  ALKPHOS 62 49  BILITOT 0.7 0.4  PROT 8.0 6.7  ALBUMIN 3.5 2.9*   CBG: Recent Labs  Lab 11/14/23 0546 11/14/23 1144 11/14/23  1732 11/15/23 0029  GLUCAP 96 86 98 106*    Discharge time spent: {LESS THAN/GREATER THAN:26388} 30 minutes.  Signed: Aisha Hove, MD [facility] 11/15/2023

## 2023-11-15 NOTE — Plan of Care (Signed)

## 2023-11-15 NOTE — Progress Notes (Signed)
 Paged provider  patient stated that she will likely discharge today , will be able to start her on oxycodone  5 mg before she leaves to see how much relief she will get so that she will  have pain mgmt in place when she gets home I was thinking that she can get a prescription for at least 5 days and she should be ok after that thanks  Awaiting response

## 2023-11-16 ENCOUNTER — Encounter (HOSPITAL_COMMUNITY): Payer: Self-pay | Admitting: Gastroenterology

## 2023-11-21 ENCOUNTER — Inpatient Hospital Stay: Payer: BC Managed Care – PPO

## 2023-11-21 ENCOUNTER — Inpatient Hospital Stay: Payer: BC Managed Care – PPO | Attending: Internal Medicine

## 2023-11-21 ENCOUNTER — Inpatient Hospital Stay (HOSPITAL_BASED_OUTPATIENT_CLINIC_OR_DEPARTMENT_OTHER): Payer: BC Managed Care – PPO | Admitting: Internal Medicine

## 2023-11-21 VITALS — BP 111/77 | HR 89 | Resp 18

## 2023-11-21 VITALS — BP 118/83 | HR 76 | Temp 98.5°F | Resp 16 | Ht 71.0 in | Wt 88.1 lb

## 2023-11-21 DIAGNOSIS — R1013 Epigastric pain: Secondary | ICD-10-CM | POA: Insufficient documentation

## 2023-11-21 DIAGNOSIS — B370 Candidal stomatitis: Secondary | ICD-10-CM | POA: Insufficient documentation

## 2023-11-21 DIAGNOSIS — Z5112 Encounter for antineoplastic immunotherapy: Secondary | ICD-10-CM | POA: Diagnosis not present

## 2023-11-21 DIAGNOSIS — C3431 Malignant neoplasm of lower lobe, right bronchus or lung: Secondary | ICD-10-CM

## 2023-11-21 DIAGNOSIS — M549 Dorsalgia, unspecified: Secondary | ICD-10-CM | POA: Insufficient documentation

## 2023-11-21 DIAGNOSIS — Z95828 Presence of other vascular implants and grafts: Secondary | ICD-10-CM

## 2023-11-21 DIAGNOSIS — K208 Other esophagitis without bleeding: Secondary | ICD-10-CM | POA: Insufficient documentation

## 2023-11-21 DIAGNOSIS — C3481 Malignant neoplasm of overlapping sites of right bronchus and lung: Secondary | ICD-10-CM | POA: Insufficient documentation

## 2023-11-21 LAB — CBC WITH DIFFERENTIAL (CANCER CENTER ONLY)
Abs Immature Granulocytes: 0.19 10*3/uL — ABNORMAL HIGH (ref 0.00–0.07)
Basophils Absolute: 0 10*3/uL (ref 0.0–0.1)
Basophils Relative: 1 %
Eosinophils Absolute: 0 10*3/uL (ref 0.0–0.5)
Eosinophils Relative: 0 %
HCT: 40.7 % (ref 36.0–46.0)
Hemoglobin: 14.1 g/dL (ref 12.0–15.0)
Immature Granulocytes: 3 %
Lymphocytes Relative: 19 %
Lymphs Abs: 1.2 10*3/uL (ref 0.7–4.0)
MCH: 31.6 pg (ref 26.0–34.0)
MCHC: 34.6 g/dL (ref 30.0–36.0)
MCV: 91.3 fL (ref 80.0–100.0)
Monocytes Absolute: 0.6 10*3/uL (ref 0.1–1.0)
Monocytes Relative: 9 %
Neutro Abs: 4.4 10*3/uL (ref 1.7–7.7)
Neutrophils Relative %: 68 %
Platelet Count: 427 10*3/uL — ABNORMAL HIGH (ref 150–400)
RBC: 4.46 MIL/uL (ref 3.87–5.11)
RDW: 13.9 % (ref 11.5–15.5)
WBC Count: 6.3 10*3/uL (ref 4.0–10.5)
nRBC: 0 % (ref 0.0–0.2)

## 2023-11-21 LAB — CMP (CANCER CENTER ONLY)
ALT: 20 U/L (ref 0–44)
AST: 19 U/L (ref 15–41)
Albumin: 3.8 g/dL (ref 3.5–5.0)
Alkaline Phosphatase: 88 U/L (ref 38–126)
Anion gap: 7 (ref 5–15)
BUN: 17 mg/dL (ref 8–23)
CO2: 23 mmol/L (ref 22–32)
Calcium: 10.4 mg/dL — ABNORMAL HIGH (ref 8.9–10.3)
Chloride: 106 mmol/L (ref 98–111)
Creatinine: 0.75 mg/dL (ref 0.44–1.00)
GFR, Estimated: >60 mL/min (ref >60)
Glucose, Bld: 168 mg/dL — ABNORMAL HIGH (ref 70–99)
Potassium: 4.1 mmol/L (ref 3.5–5.1)
Sodium: 136 mmol/L (ref 135–145)
Total Bilirubin: 0.4 mg/dL (ref 0.0–1.2)
Total Protein: 8.3 g/dL — ABNORMAL HIGH (ref 6.5–8.1)

## 2023-11-21 MED ORDER — FLUCONAZOLE 100 MG PO TABS: 100.0000 mg | ORAL_TABLET | Freq: Every day | ORAL | 0 refills | Status: DC

## 2023-11-21 MED ORDER — SODIUM CHLORIDE 0.9% FLUSH
10.0000 mL | INTRAVENOUS | Status: DC | PRN
  Administered 2023-11-21: 10 mL

## 2023-11-21 MED ORDER — SODIUM CHLORIDE 0.9 % IV SOLN
1500.0000 mg | Freq: Once | INTRAVENOUS | Status: AC
  Administered 2023-11-21: 1500 mg via INTRAVENOUS
  Filled 2023-11-21: qty 30

## 2023-11-21 MED ORDER — SODIUM CHLORIDE 0.9 % IV SOLN: INTRAVENOUS | Status: DC

## 2023-11-21 MED ORDER — HEPARIN SOD (PORK) LOCK FLUSH 100 UNIT/ML IV SOLN
500.0000 [IU] | Freq: Once | INTRAVENOUS | Status: AC | PRN
Start: 2023-11-21 — End: 2023-11-21
  Administered 2023-11-21: 500 [IU]

## 2023-11-21 MED ORDER — SODIUM CHLORIDE 0.9% FLUSH
10.0000 mL | Freq: Once | INTRAVENOUS | Status: AC
  Administered 2023-11-21: 10 mL

## 2023-11-21 NOTE — Progress Notes (Signed)
 [facility] [phone]   [phone]  OFFICE PROGRESS NOTE  Kristy Neighbors, MD [address], [address] [place] [address]  DIAGNOSIS: Stage IIIb (T4, N2, M0) non-small cell lung cancer, squamous cell carcinoma presented with large right lower lobe lung mass in addition to other right upper lobe pulmonary nodule and right hilar and mediastinal lymphadenopathy in September 2024.   PRIOR THERAPY: A course of concurrent chemoradiation with weekly carboplatin  for AUC of 2 and paclitaxel  45 Mg/M2. First dose June 06, 2023. Status post 6 cycles.    CURRENT THERAPY: Consolidation with Imfinzi  1500 mg IV every 4 weeks. First dose expected on 08/29/2023. Status post 3 cycle of treatment.   INTERVAL HISTORY: Kristy Salazar 83 y.o. female returns to the clinic today for follow-up visit accompanied by her daughter.Discussed the use of AI scribe software for clinical note transcription with the patient, who gave verbal consent to proceed.  History of Present Illness   Kristy Salazar is an 83 year old female with stage 3B non-small cell lung cancer who presents for evaluation before starting cycle number four of immunotherapy. She is accompanied by her daughter.  She was diagnosed with stage 3B non-small cell lung cancer, squamous cell carcinoma, in September 2024. She completed concurrent chemoradiation with weekly carboplatin  and paclitaxel  and is currently on consolidation treatment with Imfinzi  (durvalumab ) every four weeks.  She experiences fatigue and epigastric pain radiating to her back. She takes antacids, including Prilosec, following esophageal inflammation post-radiation. Her caregiver notes that potassium intake causes a burning sensation.  She has a history of thrush, likely related to inhaler use, and her caregiver suspects it is returning. She also experiences postnasal drip.  Her nutrition is a concern as she is not eating well due to fear. There is  a discussion about revisiting nutritional support options. She hydrates well, drinking water throughout the day.        MEDICAL HISTORY: Past Medical History:  Diagnosis Date   Arthritis    Asthma    COPD (chronic obstructive pulmonary disease) (HCC)    Diabetes mellitus    patient denies   Hypertension    Non-small cell lung cancer (HCC)    Radiation esophagitis    Seasonal allergies     ALLERGIES:  is allergic to erythromycin.  MEDICATIONS:  Current Outpatient Medications  Medication Sig Dispense Refill   albuterol  (VENTOLIN  HFA) 108 (90 Base) MCG/ACT inhaler Inhale 2 puffs into the lungs every 6 (six) hours as needed for wheezing or shortness of breath. 8 g 2   FARXIGA 10 MG TABS tablet Take 10 mg by mouth daily.     fluconazole  (DIFLUCAN ) 100 MG tablet Take 100 mg by mouth daily.     Fluticasone-Umeclidin-Vilant 100-62.5-25 MCG/INH AEPB Inhale 2 application  into the lungs daily as needed. (Patient not taking: Reported on 11/14/2023)     KLOR-CON  20 MEQ packet Take 20 mEq by mouth daily. (Patient not taking: Reported on 11/14/2023)     megestrol (MEGACE) 40 MG/ML suspension Take 40 mg by mouth daily.     mirtazapine (REMERON) 7.5 MG tablet Take 7.5 mg by mouth at bedtime.     montelukast  (SINGULAIR ) 10 MG tablet Take 10 mg by mouth daily. For seasonal allergies     Multiple Vitamin (MULTIVITAMIN WITH MINERALS) TABS tablet Take 1 tablet by mouth daily.     pantoprazole  (PROTONIX ) 40 MG tablet Take 1 tablet (40 mg total) by mouth daily.  Follow up with gastroenterology for additional instructions 30 tablet 1   potassium chloride  20 MEQ/15ML (10%) SOLN Take 15 mLs (20 mEq total) by mouth 2 (two) times daily. (Patient not taking: Reported on 11/14/2023) 270 mL 0   prochlorperazine  (COMPAZINE ) 10 MG tablet TAKE 1 TABLET BY MOUTH EVERY 6 HOURS AS NEEDED FOR NAUSEA FOR VOMITING 30 tablet 0   rosuvastatin  (CRESTOR ) 10 MG tablet Take 10 mg by mouth daily.       sucralfate  (CARAFATE ) 1 g  tablet Take 1 tablet (1 g total) by mouth 4 (four) times daily -  with meals and at bedtime. Crush 1 tablet in 1 oz water and drink 5 min before meals for radiation induced esophagitis 120 tablet 2   tiotropium (SPIRIVA ) 18 MCG inhalation capsule Place 18 mcg into inhaler and inhale daily.     No current facility-administered medications for this visit.    SURGICAL HISTORY:  Past Surgical History:  Procedure Laterality Date   ABDOMINAL HYSTERECTOMY     APPENDECTOMY     BACK SURGERY     BRONCHIAL BIOPSY  05/17/2023   Procedure: BRONCHIAL BIOPSIES;  Surgeon: Denson Flake, MD;  Location: [facility] ENDOSCOPY;  Service: Pulmonary;;   BRONCHIAL BRUSHINGS  05/17/2023   Procedure: BRONCHIAL BRUSHINGS;  Surgeon: Denson Flake, MD;  Location: [facility] ENDOSCOPY;  Service: Pulmonary;;   BRONCHIAL NEEDLE ASPIRATION BIOPSY  05/17/2023   Procedure: BRONCHIAL NEEDLE ASPIRATION BIOPSIES;  Surgeon: Denson Flake, MD;  Location: [facility] ENDOSCOPY;  Service: Pulmonary;;   ESOPHAGOGASTRODUODENOSCOPY N/A 11/14/2023   Procedure: EGD (ESOPHAGOGASTRODUODENOSCOPY);  Surgeon: Ozell Blunt, MD;  Location: [facility] ENDOSCOPY;  Service: Gastroenterology;  Laterality: N/A;   ESOPHAGOGASTRODUODENOSCOPY (EGD) WITH PROPOFOL  N/A 09/13/2023   Procedure: ESOPHAGOGASTRODUODENOSCOPY (EGD) WITH PROPOFOL ;  Surgeon: Renaye Carp, DO;  Location: [facility] ENDOSCOPY;  Service: Gastroenterology;  Laterality: N/A;   IR IMAGING GUIDED PORT INSERTION  06/03/2023   JOINT REPLACEMENT     TOTAL HIP ARTHROPLASTY     TOTAL KNEE ARTHROPLASTY     VIDEO BRONCHOSCOPY WITH ENDOBRONCHIAL ULTRASOUND Right 05/17/2023   Procedure: VIDEO BRONCHOSCOPY WITH ENDOBRONCHIAL ULTRASOUND;  Surgeon: Denson Flake, MD;  Location: [facility] ENDOSCOPY;  Service: Pulmonary;  Laterality: Right;    REVIEW OF SYSTEMS:  Constitutional: positive for fatigue Eyes: negative Ears, nose, mouth, throat, and face: negative Respiratory: negative Cardiovascular: negative Gastrointestinal: positive  for dysphagia Genitourinary:negative Integument/breast: negative Hematologic/lymphatic: negative Musculoskeletal:negative Neurological: negative Behavioral/Psych: negative Endocrine: negative Allergic/Immunologic: negative   PHYSICAL EXAMINATION: General appearance: alert, cooperative, fatigued, and no distress Head: Normocephalic, without obvious abnormality, atraumatic Neck: no adenopathy, no JVD, supple, symmetrical, trachea midline, and thyroid  not enlarged, symmetric, no tenderness/mass/nodules Lymph nodes: Cervical, supraclavicular, and axillary nodes normal. Resp: clear to auscultation bilaterally Back: symmetric, no curvature. ROM normal. No CVA tenderness. Cardio: regular rate and rhythm, S1, S2 normal, no murmur, click, rub or gallop GI: soft, non-tender; bowel sounds normal; no masses,  no organomegaly Extremities: extremities normal, atraumatic, no cyanosis or edema Neurologic: Alert and oriented X 3, normal strength and tone. Normal symmetric reflexes. Normal coordination and gait  ECOG PERFORMANCE STATUS: 1 - Symptomatic but completely ambulatory  Blood pressure 118/83, pulse (!) 119, temperature 98.5 F (36.9 C), temperature source Temporal, resp. rate 16, height 5\' 11"  (1.803 m), weight 88 lb 1.6 oz (40 kg), SpO2 100%.  LABORATORY DATA: Lab Results  Component Value Date   WBC 6.3 11/21/2023   HGB 14.1 11/21/2023   HCT 40.7 11/21/2023   MCV 91.3 11/21/2023  PLT 427 (H) 11/21/2023      Chemistry      Component Value Date/Time   NA 136 11/14/2023 0500   K 3.5 11/14/2023 0500   CL 109 11/14/2023 0500   CO2 22 11/14/2023 0500   BUN 8 11/14/2023 0500   CREATININE 0.39 (L) 11/14/2023 0500   CREATININE 0.62 10/24/2023 1029      Component Value Date/Time   CALCIUM  8.5 (L) 11/14/2023 0500   ALKPHOS 49 11/14/2023 0500   AST 15 11/14/2023 0500   AST 21 10/24/2023 1029   ALT 14 11/14/2023 0500   ALT 25 10/24/2023 1029   BILITOT 0.4 11/14/2023 0500   BILITOT  0.3 10/24/2023 1029       RADIOGRAPHIC STUDIES: CT Chest W Contrast Result Date: 11/19/2023 CLINICAL DATA:  Non-small cell lung cancer. Assess treatment response. * Tracking Code: BO * EXAM: CT CHEST WITH CONTRAST TECHNIQUE: Multidetector CT imaging of the chest was performed during intravenous contrast administration. RADIATION DOSE REDUCTION: This exam was performed according to the departmental dose-optimization program which includes automated exposure control, adjustment of the mA and/or kV according to patient size and/or use of iterative reconstruction technique. CONTRAST:  75mL OMNIPAQUE  IOHEXOL  300 MG/ML  SOLN COMPARISON:  Chest CT 09/09/2023.  PET-CT 05/19/2023. FINDINGS: Cardiovascular: No acute vascular findings. Accessed right IJ Port-A-Cath extends to the superior cavoatrial junction. There is atherosclerosis of the aorta, great vessels and coronary arteries. The heart size is normal. There is no pericardial effusion. Mediastinum/Nodes: There are no enlarged mediastinal, hilar or axillary lymph nodes.Scattered small mediastinal lymph nodes are unchanged. Interval improved wall thickening and fluid retention within the esophagus. The thyroid  gland appears unremarkable. Lungs/Pleura: No pleural effusion or pneumothorax. Severe centrilobular and paraseptal emphysema with diffuse central airway thickening. No significant change in dominant spiculated mass in the right lower lobe, currently measuring approximately 3.3 x 2.8 cm on image 122/6. Adjacent mucoid-impacted bronchi. New subpleural opacity medially in the right lower lobe measuring up to 2.2 x 1.6 cm on image 127/6, likely treatment related. Other patchy airspace opacities at both lung bases are unchanged. Scattered subpleural nodularity in the left lung is stable, largest posteriorly in the upper lobe measuring 1.4 x 0.7 cm on image 28/6. Upper abdomen: The visualized upper abdomen appears stable, without significant findings.  Musculoskeletal/Chest wall: There is no chest wall mass or suspicious osseous finding. IMPRESSION: 1. No significant change in dominant spiculated mass in the right lower lobe. 2. New subpleural opacity medially in the right lower lobe, likely treatment related or infectious. Attention on follow-up recommended. 3. No evidence of metastatic disease. 4. Interval improved wall thickening and fluid retention within the esophagus. 5. Aortic Atherosclerosis (ICD10-I70.0) and Emphysema (ICD10-J43.9). Electronically Signed   By: Elmon Hagedorn M.D.   On: 11/19/2023 14:16   DG Chest Portable 1 View Result Date: 11/13/2023 CLINICAL DATA:  Projectile vomiting and chest pain EXAM: PORTABLE CHEST 1 VIEW COMPARISON:  11/11/2023 FINDINGS: Cardiac shadow is stable. Aortic calcifications are seen. Right chest wall port is noted in place. Lungs are hyperinflated. Persistent spiculated mass is noted in the right base stable from the prior CT. No pneumothorax or sizable effusion is seen. IMPRESSION: Stable right lower lobe mass lesion. Hyperinflation consistent with COPD. Electronically Signed   By: Violeta Grey M.D.   On: 11/13/2023 23:34    ASSESSMENT AND PLAN: This is a very pleasant 83 years old African-American female with stage IIIb (T4, N2, M0) non-small cell lung cancer, squamous cell carcinoma presented  with large right lower lobe lung mass in addition to other right upper lobe pulmonary nodule and right hilar and mediastinal lymphadenopathy in September 2024.  She underwent a course of concurrent chemoradiation with weekly carboplatin  for AUC of 2 and paclitaxel  45 Mg/M2.  Status post 6 cycles.  She had good response to this treatment and she is currently undergoing a course of consolidation treatment with immunotherapy with Imfinzi  1500 Mg IV every 4 weeks status post 3 cycles.  She has been tolerating her treatment fairly well. She had repeat CT scan of the chest performed recently.  I personally and independently  reviewed the scan and discussed the result with the patient and her daughter.  Her scan showed no concerning findings for disease progression.     Stage 3B non-small cell lung cancer, squamous cell carcinoma Currently undergoing consolidation treatment with durvalumab . Recent CT scan shows no disease progression. She feels well enough to proceed with cycle number four of immunotherapy today. Discussed the option to delay treatment if not feeling well, but she opted to continue. - Proceed with cycle number four of durvalumab  today. - Encourage hydration.  Esophagitis due to radiation Secondary to prior radiation therapy, causing inflammation in the esophagus. Symptoms appear to be improving. She is experiencing epigastric pain with radiation to the back, possibly related to esophagitis or medication side effects. - Continue antacid use for symptom management.  Epigastric and back pain Possibly related to esophagitis or medication side effects. She is taking antacids for symptom relief. - Continue antacid use for symptom management.  Oral thrush Likely secondary to inhaler use. Reports recurrence of symptoms. - Prescribe Diflucan  for oral thrush. - Advise follow-up with family doctor.   The patient was advised to call immediately if she has any other concerning symptoms in the interval. The patient voices understanding of current disease status and treatment options and is in agreement with the current care plan.  All questions were answered. The patient knows to call the clinic with any problems, questions or concerns. We can certainly see the patient much sooner if necessary.  The total time spent in the appointment was 30 minutes.  Disclaimer: This note was dictated with voice recognition software. Similar sounding words can inadvertently be transcribed and may not be corrected upon review.

## 2023-11-21 NOTE — Patient Instructions (Signed)

## 2023-11-24 ENCOUNTER — Other Ambulatory Visit: Payer: Self-pay

## 2023-11-24 ENCOUNTER — Other Ambulatory Visit (HOSPITAL_COMMUNITY): Payer: Self-pay

## 2023-11-24 DIAGNOSIS — C3490 Malignant neoplasm of unspecified part of unspecified bronchus or lung: Secondary | ICD-10-CM | POA: Diagnosis not present

## 2023-11-24 DIAGNOSIS — J449 Chronic obstructive pulmonary disease, unspecified: Secondary | ICD-10-CM | POA: Diagnosis not present

## 2023-11-24 DIAGNOSIS — I10 Essential (primary) hypertension: Secondary | ICD-10-CM | POA: Diagnosis not present

## 2023-11-29 ENCOUNTER — Ambulatory Visit (HOSPITAL_COMMUNITY): Admit: 2023-11-29 | Admitting: Gastroenterology

## 2023-11-29 ENCOUNTER — Encounter (HOSPITAL_COMMUNITY): Payer: Self-pay

## 2023-11-29 SURGERY — EGD, WITH DILATION USING SAVARY-GILLIARD DILATOR OVER GUIDEWIRE
Anesthesia: Monitor Anesthesia Care

## 2023-12-12 DIAGNOSIS — K2970 Gastritis, unspecified, without bleeding: Secondary | ICD-10-CM | POA: Diagnosis not present

## 2023-12-12 DIAGNOSIS — R1310 Dysphagia, unspecified: Secondary | ICD-10-CM | POA: Diagnosis not present

## 2023-12-12 DIAGNOSIS — K222 Esophageal obstruction: Secondary | ICD-10-CM | POA: Diagnosis not present

## 2023-12-14 NOTE — Progress Notes (Signed)
 [facility] OFFICE PROGRESS NOTE  Kristy Neighbors, MD [address] [place] [address]  DIAGNOSIS: Stage IIIb (T4, N2, M0) non-small cell lung cancer, squamous cell carcinoma presented with large right lower lobe lung mass in addition to other right upper lobe pulmonary nodule and right hilar and mediastinal lymphadenopathy in September 2024.   PRIOR THERAPY: A course of concurrent chemoradiation with weekly carboplatin  for AUC of 2 and paclitaxel  45 Mg/M2. First dose June 06, 2023. Status post 6 cycles.   CURRENT THERAPY: Consolidation with Imfinzi  1500 mg IV every 4 weeks. First dose expected on 08/29/2023. Status post 4 cycles of treatment.     INTERVAL HISTORY: Kristy Salazar 83 y.o. female returns to the clinic today for a follow-up visit accompanied by her daughters.  The patient was last seen in the clinic by Dr. Marguerita Shih on 11/21/23. She is currently on consolidation immunotherapy with Imfinzi . She is status post 4 cycles of treatment and she tolerates it well.   She is still having epigastric discomfort from prior radiation esophagitis. She has seen GI who performed dilation. She had a dilation performed last week. She states she is swallowing better. She does have a lot of belching. She was on protonix  before but is currently taking tums. She is wondering what she should take. Over the course of time from her diagnosis. She has lost a lot of weight. She her baseline weight was previously 130 lbs and she is 87 lbs now. She drinks protein drinks twice a day. She does have thrush and needs a refill of medication for thrush. It looks like she carries a diagnosis of prolonged QT.   She has some deconditioning due to the weight loss. She had an assessment a few days ago for home PT, OT, and home health.   She experiences cold intolerance, stating she 'stays cold' and sleeps with a heater on. No fevers, infections, or night sweats are present. She has nasal congestion and  uses Norel, which she crushes and takes at night. She continues to use her inhaler but has not been using Singulair  recently. She sees her PCP next week to follow up on this. She has difficulty swallowing, leading to spitting rather than vomiting, and attributes the issue to her throat.   She experiences a persistent cough, attributed to congestion, and finds relief with cough syrup. The cough has not worsened since her last visit. No hemoptysis or chest pain is reported.  She has a history of constipation, with a recent episode involving significant straining. No rashes, unusual headaches, or vision changes are reported.   She is followed by a member of the nutritionist team and is scheduled to see them today. They would be interested in getting a copy of a before and after picture of her lung mass. She is here today for evaluation and repeat blood work before undergoing cycle #5.    MEDICAL HISTORY: Past Medical History:  Diagnosis Date   Arthritis    Asthma    COPD (chronic obstructive pulmonary disease) (HCC)    Diabetes mellitus    patient denies   Hypertension    Non-small cell lung cancer (HCC)    Radiation esophagitis    Seasonal allergies     ALLERGIES:  is allergic to erythromycin.  MEDICATIONS:  Current Outpatient Medications  Medication Sig Dispense Refill   FARXIGA 10 MG TABS tablet Take 10 mg by mouth daily.     fluconazole  (DIFLUCAN ) 100 MG tablet Take  1 tablet (100 mg total) by mouth daily. 7 tablet 0   Fluticasone-Umeclidin-Vilant 100-62.5-25 MCG/INH AEPB Inhale 2 application  into the lungs daily as needed. (Patient not taking: Reported on 11/14/2023)     KLOR-CON  20 MEQ packet Take 20 mEq by mouth daily. (Patient not taking: Reported on 11/14/2023)     megestrol (MEGACE) 40 MG/ML suspension Take 40 mg by mouth daily.     montelukast  (SINGULAIR ) 10 MG tablet Take 10 mg by mouth daily. For seasonal allergies     Multiple Vitamin (MULTIVITAMIN WITH MINERALS) TABS  tablet Take 1 tablet by mouth daily.     pantoprazole  (PROTONIX ) 40 MG tablet Take 1 tablet (40 mg total) by mouth daily. Follow up with gastroenterology for additional instructions 30 tablet 1   potassium chloride  20 MEQ/15ML (10%) SOLN Take 15 mLs (20 mEq total) by mouth 2 (two) times daily. (Patient not taking: Reported on 11/14/2023) 270 mL 0   prochlorperazine  (COMPAZINE ) 10 MG tablet TAKE 1 TABLET BY MOUTH EVERY 6 HOURS AS NEEDED FOR NAUSEA FOR VOMITING 30 tablet 0   rosuvastatin  (CRESTOR ) 10 MG tablet Take 10 mg by mouth daily.       sucralfate  (CARAFATE ) 1 g tablet Take 1 tablet (1 g total) by mouth 4 (four) times daily -  with meals and at bedtime. Crush 1 tablet in 1 oz water and drink 5 min before meals for radiation induced esophagitis 120 tablet 2   tiotropium (SPIRIVA ) 18 MCG inhalation capsule Place 18 mcg into inhaler and inhale daily.     No current facility-administered medications for this visit.    SURGICAL HISTORY:  Past Surgical History:  Procedure Laterality Date   ABDOMINAL HYSTERECTOMY     APPENDECTOMY     BACK SURGERY     BRONCHIAL BIOPSY  05/17/2023   Procedure: BRONCHIAL BIOPSIES;  Surgeon: Denson Flake, MD;  Location: [facility] ENDOSCOPY;  Service: Pulmonary;;   BRONCHIAL BRUSHINGS  05/17/2023   Procedure: BRONCHIAL BRUSHINGS;  Surgeon: Denson Flake, MD;  Location: [facility] ENDOSCOPY;  Service: Pulmonary;;   BRONCHIAL NEEDLE ASPIRATION BIOPSY  05/17/2023   Procedure: BRONCHIAL NEEDLE ASPIRATION BIOPSIES;  Surgeon: Denson Flake, MD;  Location: [facility] ENDOSCOPY;  Service: Pulmonary;;   ESOPHAGOGASTRODUODENOSCOPY N/A 11/14/2023   Procedure: EGD (ESOPHAGOGASTRODUODENOSCOPY);  Surgeon: Ozell Blunt, MD;  Location: [facility] ENDOSCOPY;  Service: Gastroenterology;  Laterality: N/A;   ESOPHAGOGASTRODUODENOSCOPY (EGD) WITH PROPOFOL  N/A 09/13/2023   Procedure: ESOPHAGOGASTRODUODENOSCOPY (EGD) WITH PROPOFOL ;  Surgeon: Renaye Carp, DO;  Location: [facility] ENDOSCOPY;  Service: Gastroenterology;   Laterality: N/A;   IR IMAGING GUIDED PORT INSERTION  06/03/2023   JOINT REPLACEMENT     TOTAL HIP ARTHROPLASTY     TOTAL KNEE ARTHROPLASTY     VIDEO BRONCHOSCOPY WITH ENDOBRONCHIAL ULTRASOUND Right 05/17/2023   Procedure: VIDEO BRONCHOSCOPY WITH ENDOBRONCHIAL ULTRASOUND;  Surgeon: Denson Flake, MD;  Location: [facility] ENDOSCOPY;  Service: Pulmonary;  Laterality: Right;    REVIEW OF SYSTEMS:   Review of Systems  Constitutional: Positive for fatigue. Positive for improving but diminished appetite and progressive weight loss over several months. Negative for chills and fever.  HENT: Positive for oral thrush. Positive for nasal congestion. Improving trouble swallowing. Negative for mouth sores, nosebleeds, sore throat.  Eyes: Negative for eye problems and icterus.  Respiratory: Mild dyspnea on exertion. Negative for cough, hemoptysis, and wheezing.   Cardiovascular: Negative for chest pain and leg swelling.  Gastrointestinal: Positive for occasional constipation. Negative for abdominal pain, diarrhea, nausea and vomiting.  Genitourinary:  Negative for bladder incontinence, difficulty urinating, dysuria, frequency and hematuria.   Musculoskeletal: Negative for back pain, gait problem, neck pain and neck stiffness.  Skin: Negative for itching and rash.  Neurological: Negative for dizziness, extremity weakness, gait problem, headaches, light-headedness and seizures.  Hematological: Negative for adenopathy. Does not bruise/bleed easily.  Psychiatric/Behavioral: Negative for confusion, depression and sleep disturbance. The patient is not nervous/anxious.     PHYSICAL EXAMINATION:  There were no vitals taken for this visit.  ECOG PERFORMANCE STATUS: 2  Physical Exam  Constitutional: Oriented to person, place, and time and thin appearing female, and in no distress.  HENT:  Head: Normocephalic and atraumatic.  Mouth/Throat: Oropharynx is clear and moist. Positive for oral thrush. No oropharyngeal  exudate.  Eyes: Conjunctivae are normal. Right eye exhibits no discharge. Left eye exhibits no discharge. No scleral icterus.  Neck: Normal range of motion. Neck supple.  Cardiovascular: Normal rate, regular rhythm, normal heart sounds and intact distal pulses.   Pulmonary/Chest: Effort normal. Quiet breath sounds bilaterally. No respiratory distress. No wheezes. No rales.  Abdominal: Soft. Bowel sounds are normal. Exhibits no distension and no mass. There is no tenderness.  Musculoskeletal: Normal range of motion. Exhibits no edema.  Lymphadenopathy:    No cervical adenopathy.  Neurological: Alert and oriented to person, place, and time. Exhibits muscle wasting. Examined in the wheelchair.  Skin: Skin is warm and dry. No rash noted. Not diaphoretic. No erythema. No pallor.  Psychiatric: Mood, memory and judgment normal.  Vitals reviewed.  LABORATORY DATA: Lab Results  Component Value Date   WBC 6.3 11/21/2023   HGB 14.1 11/21/2023   HCT 40.7 11/21/2023   MCV 91.3 11/21/2023   PLT 427 (H) 11/21/2023      Chemistry      Component Value Date/Time   NA 136 11/21/2023 0926   K 4.1 11/21/2023 0926   CL 106 11/21/2023 0926   CO2 23 11/21/2023 0926   BUN 17 11/21/2023 0926   CREATININE 0.75 11/21/2023 0926      Component Value Date/Time   CALCIUM  10.4 (H) 11/21/2023 0926   ALKPHOS 88 11/21/2023 0926   AST 19 11/21/2023 0926   ALT 20 11/21/2023 0926   BILITOT 0.4 11/21/2023 0926       RADIOGRAPHIC STUDIES:  No results found.   ASSESSMENT/PLAN:  This is a very pleasant 83 year old African-American female with stage IIIb (T4, N2, M0) non-small cell lung cancer, squamous cell carcinoma.  She presented with a large right lower lobe lung mass in addition to other right upper lobe pulmonary nodules and right hilar mediastinal lymphadenopathy.  She was diagnosed in September 2024.     PDL1 expression 1% per navigator.    She completed a course of concurrent chemoradiation with  weekly carboplatin  for an AUC of 2 paclitaxel  45 mg/m. She started on 06/06/23. She is status post 7 cycles.  Her last day of treatment was on 07/18/2023.   She is currently undergoing treatment with consolidation immunotherapy with Imfinzi  1500 mg IV every 4 weeks. She is status post 4 cycles. First dose on 08/30/23.  He did not have any appreciable side effects from treatment.  Labs were reviewed. Recommend she proceed with cycle #5 today as scheduled.     Will see her back for follow-up visit in 4 weeks for evaluation and repeat blood work before undergoing cycle #6.    It sounds like her PCP started her on an appetite stimulant with megace.  Recommend that she continue  this. She is going to see her PCP regarding additional recommendations for nasal congestion. I suggested mucinex  if she has thick sputum. She also uses delsym for cough.   Prolonged QT interval/Oral Thrush  Most recent EKG showed normal QT interval but she does have a history spanning several months a few months ago with prolonged QT on EKGs. Avoidance of Diflucan  due to QT prolongation risk. - Avoid prescribing Diflucan  due to risk of QT prolongation. - Sent nystatin  in for oral thrush instead.   Cachexia Significant weight loss below baseline of 130 lbs. Deconditioning and muscle mass loss noted. - Encourage increased caloric intake, including protein drinks like Ensure. - Incorporate learned exercises from physical therapy into daily routine. - Encourage safe physical activity to prevent falls and improve muscle mass. -She is scheduled to see a member of the nutritionist team today. Hopefully her weight and appetite will improve since her swallowing has improved.  -Can request refill for protonix .   I gave them a copy of the before and after picture of her lung mass.   The patient was advised to call immediately if she has any concerning symptoms in the interval. The patient voices understanding of current disease status  and treatment options and is in agreement with the current care plan. All questions were answered. The patient knows to call the clinic with any problems, questions or concerns. We can certainly see the patient much sooner if necessary   No orders of the defined types were placed in this encounter.    The total time spent in the appointment was 20-29 minutes  Tayen Narang L Mansfield Dann, PA-C 12/14/23

## 2023-12-18 DIAGNOSIS — K2100 Gastro-esophageal reflux disease with esophagitis, without bleeding: Secondary | ICD-10-CM | POA: Diagnosis not present

## 2023-12-18 DIAGNOSIS — R634 Abnormal weight loss: Secondary | ICD-10-CM | POA: Diagnosis not present

## 2023-12-18 DIAGNOSIS — C3490 Malignant neoplasm of unspecified part of unspecified bronchus or lung: Secondary | ICD-10-CM | POA: Diagnosis not present

## 2023-12-18 DIAGNOSIS — Z681 Body mass index (BMI) 19 or less, adult: Secondary | ICD-10-CM | POA: Diagnosis not present

## 2023-12-18 DIAGNOSIS — Z9181 History of falling: Secondary | ICD-10-CM | POA: Diagnosis not present

## 2023-12-18 DIAGNOSIS — J449 Chronic obstructive pulmonary disease, unspecified: Secondary | ICD-10-CM | POA: Diagnosis not present

## 2023-12-18 DIAGNOSIS — Z556 Problems related to health literacy: Secondary | ICD-10-CM | POA: Diagnosis not present

## 2023-12-18 DIAGNOSIS — Z7951 Long term (current) use of inhaled steroids: Secondary | ICD-10-CM | POA: Diagnosis not present

## 2023-12-18 DIAGNOSIS — L89152 Pressure ulcer of sacral region, stage 2: Secondary | ICD-10-CM | POA: Diagnosis not present

## 2023-12-20 ENCOUNTER — Inpatient Hospital Stay: Attending: Internal Medicine

## 2023-12-20 ENCOUNTER — Inpatient Hospital Stay: Attending: Internal Medicine | Admitting: Dietician

## 2023-12-20 ENCOUNTER — Inpatient Hospital Stay: Attending: Internal Medicine | Admitting: Physician Assistant

## 2023-12-20 ENCOUNTER — Inpatient Hospital Stay

## 2023-12-20 VITALS — BP 109/84 | HR 76 | Temp 97.9°F | Resp 14 | Wt 87.8 lb

## 2023-12-20 DIAGNOSIS — C3411 Malignant neoplasm of upper lobe, right bronchus or lung: Secondary | ICD-10-CM | POA: Insufficient documentation

## 2023-12-20 DIAGNOSIS — R64 Cachexia: Secondary | ICD-10-CM | POA: Insufficient documentation

## 2023-12-20 DIAGNOSIS — C3431 Malignant neoplasm of lower lobe, right bronchus or lung: Secondary | ICD-10-CM

## 2023-12-20 DIAGNOSIS — Z923 Personal history of irradiation: Secondary | ICD-10-CM | POA: Diagnosis not present

## 2023-12-20 DIAGNOSIS — Z5112 Encounter for antineoplastic immunotherapy: Secondary | ICD-10-CM

## 2023-12-20 DIAGNOSIS — R9431 Abnormal electrocardiogram [ECG] [EKG]: Secondary | ICD-10-CM | POA: Insufficient documentation

## 2023-12-20 DIAGNOSIS — B370 Candidal stomatitis: Secondary | ICD-10-CM

## 2023-12-20 LAB — CMP (CANCER CENTER ONLY)
ALT: 16 U/L (ref 0–44)
AST: 19 U/L (ref 15–41)
Albumin: 3.2 g/dL — ABNORMAL LOW (ref 3.5–5.0)
Alkaline Phosphatase: 107 U/L (ref 38–126)
Anion gap: 6 (ref 5–15)
BUN: 12 mg/dL (ref 8–23)
CO2: 25 mmol/L (ref 22–32)
Calcium: 9.4 mg/dL (ref 8.9–10.3)
Chloride: 108 mmol/L (ref 98–111)
Creatinine: 0.53 mg/dL (ref 0.44–1.00)
GFR, Estimated: >60 mL/min (ref >60)
Glucose, Bld: 99 mg/dL (ref 70–99)
Potassium: 3.9 mmol/L (ref 3.5–5.1)
Sodium: 139 mmol/L (ref 135–145)
Total Bilirubin: 0.4 mg/dL (ref 0.0–1.2)
Total Protein: 7.5 g/dL (ref 6.5–8.1)

## 2023-12-20 LAB — CBC WITH DIFFERENTIAL (CANCER CENTER ONLY)
Abs Immature Granulocytes: 0.25 10*3/uL — ABNORMAL HIGH (ref 0.00–0.07)
Basophils Absolute: 0.1 10*3/uL (ref 0.0–0.1)
Basophils Relative: 0 %
Eosinophils Absolute: 0.1 10*3/uL (ref 0.0–0.5)
Eosinophils Relative: 1 %
HCT: 34.7 % — ABNORMAL LOW (ref 36.0–46.0)
Hemoglobin: 11.5 g/dL — ABNORMAL LOW (ref 12.0–15.0)
Immature Granulocytes: 2 %
Lymphocytes Relative: 8 %
Lymphs Abs: 0.9 10*3/uL (ref 0.7–4.0)
MCH: 30.7 pg (ref 26.0–34.0)
MCHC: 33.1 g/dL (ref 30.0–36.0)
MCV: 92.8 fL (ref 80.0–100.0)
Monocytes Absolute: 1 10*3/uL (ref 0.1–1.0)
Monocytes Relative: 8 %
Neutro Abs: 9.7 10*3/uL — ABNORMAL HIGH (ref 1.7–7.7)
Neutrophils Relative %: 81 %
Platelet Count: 583 10*3/uL — ABNORMAL HIGH (ref 150–400)
RBC: 3.74 MIL/uL — ABNORMAL LOW (ref 3.87–5.11)
RDW: 14.3 % (ref 11.5–15.5)
WBC Count: 12 10*3/uL — ABNORMAL HIGH (ref 4.0–10.5)
nRBC: 0 % (ref 0.0–0.2)

## 2023-12-20 MED ORDER — SODIUM CHLORIDE 0.9% FLUSH: 10.0000 mL | INTRAVENOUS | Status: DC | PRN

## 2023-12-20 MED ORDER — SODIUM CHLORIDE 0.9 % IV SOLN
1500.0000 mg | Freq: Once | INTRAVENOUS | Status: AC
  Administered 2023-12-20: 1500 mg via INTRAVENOUS
  Filled 2023-12-20: qty 30

## 2023-12-20 MED ORDER — SODIUM CHLORIDE 0.9 % IV SOLN: INTRAVENOUS | Status: DC

## 2023-12-20 MED ORDER — HEPARIN SOD (PORK) LOCK FLUSH 100 UNIT/ML IV SOLN
500.0000 [IU] | Freq: Once | INTRAVENOUS | Status: DC | PRN
Start: 2023-12-20 — End: 2023-12-20

## 2023-12-20 MED ORDER — NYSTATIN 100000 UNIT/ML MT SUSP: 5.0000 mL | Freq: Four times a day (QID) | OROMUCOSAL | 0 refills | Status: DC

## 2023-12-20 NOTE — Patient Instructions (Signed)

## 2023-12-20 NOTE — Progress Notes (Signed)
 Nutrition Follow-up:  Patient with stage III non-small cell lung cancer. Completed concurrent chemotherapy and radiation (last radiation on 1/14). Receiving imfinzi .   5/4-5/6 - admission for XRT induced esophagitis   Met with patient in infusion. Cousin from out of town present for visit today. Pt reports radiation esophagitis is much improved. Patient endorses improving appetite and eating more. Patient endorses dysphagia with breads. Cousin notes pt eating very small portions. Recalls 50% of 1/4 c rice krispies with 1/2 banana for breakfast. Patient likes jello, pudding, peanut butter, ritz crackers, cheese puffs. She is drinking 2 Ensure most days.   Medications: reviewed   Labs: albumin 3.2  Anthropometrics: Wt 87 lb 12.8 oz today   5/12 - 88 lb 1.6 oz (hospitalized) 4/14 - 93 lb 4.8 oz    NUTRITION DIAGNOSIS: Unintentional wt loss - stable x 4 weeks   MALNUTRITION DIAGNOSIS: Severe malnutrition ongoing    INTERVENTION:  Educated on small frequent meals/snacks (bites/sips q2-3h) Encourage soft moist textures for ease of intake Reviewed foods with protein, recommend protein source at every meal Sit upright to eat, remain upright for at least 30 minutes Encourage bedtime snack Encourage high calorie high protein foods to promote wt gain Continue 3 Ensure Plus/equivalent - coupons provided     MONITORING, EVALUATION, GOAL: wt trends, intake   NEXT VISIT: To be scheduled with treatment

## 2023-12-21 DIAGNOSIS — H2513 Age-related nuclear cataract, bilateral: Secondary | ICD-10-CM | POA: Diagnosis not present

## 2023-12-21 DIAGNOSIS — Z961 Presence of intraocular lens: Secondary | ICD-10-CM | POA: Diagnosis not present

## 2023-12-21 DIAGNOSIS — H524 Presbyopia: Secondary | ICD-10-CM | POA: Diagnosis not present

## 2023-12-22 ENCOUNTER — Other Ambulatory Visit: Payer: Self-pay | Admitting: Physician Assistant

## 2023-12-22 ENCOUNTER — Telehealth: Payer: Self-pay

## 2023-12-22 DIAGNOSIS — K2100 Gastro-esophageal reflux disease with esophagitis, without bleeding: Secondary | ICD-10-CM | POA: Diagnosis not present

## 2023-12-22 DIAGNOSIS — Z7951 Long term (current) use of inhaled steroids: Secondary | ICD-10-CM | POA: Diagnosis not present

## 2023-12-22 DIAGNOSIS — G893 Neoplasm related pain (acute) (chronic): Secondary | ICD-10-CM

## 2023-12-22 DIAGNOSIS — C3490 Malignant neoplasm of unspecified part of unspecified bronchus or lung: Secondary | ICD-10-CM | POA: Diagnosis not present

## 2023-12-22 DIAGNOSIS — J449 Chronic obstructive pulmonary disease, unspecified: Secondary | ICD-10-CM | POA: Diagnosis not present

## 2023-12-22 DIAGNOSIS — Z681 Body mass index (BMI) 19 or less, adult: Secondary | ICD-10-CM | POA: Diagnosis not present

## 2023-12-22 DIAGNOSIS — R634 Abnormal weight loss: Secondary | ICD-10-CM | POA: Diagnosis not present

## 2023-12-22 DIAGNOSIS — Z556 Problems related to health literacy: Secondary | ICD-10-CM | POA: Diagnosis not present

## 2023-12-22 DIAGNOSIS — L89152 Pressure ulcer of sacral region, stage 2: Secondary | ICD-10-CM | POA: Diagnosis not present

## 2023-12-22 DIAGNOSIS — Z9181 History of falling: Secondary | ICD-10-CM | POA: Diagnosis not present

## 2023-12-22 MED ORDER — HYDROCODONE-ACETAMINOPHEN 5-325 MG PO TABS: 1.0000 | ORAL_TABLET | Freq: Four times a day (QID) | ORAL | 0 refills | Status: DC | PRN

## 2023-12-23 ENCOUNTER — Other Ambulatory Visit: Payer: Self-pay

## 2023-12-27 DIAGNOSIS — R634 Abnormal weight loss: Secondary | ICD-10-CM | POA: Diagnosis not present

## 2023-12-27 DIAGNOSIS — Z9181 History of falling: Secondary | ICD-10-CM | POA: Diagnosis not present

## 2023-12-27 DIAGNOSIS — K2100 Gastro-esophageal reflux disease with esophagitis, without bleeding: Secondary | ICD-10-CM | POA: Diagnosis not present

## 2023-12-27 DIAGNOSIS — Z556 Problems related to health literacy: Secondary | ICD-10-CM | POA: Diagnosis not present

## 2023-12-27 DIAGNOSIS — C3490 Malignant neoplasm of unspecified part of unspecified bronchus or lung: Secondary | ICD-10-CM | POA: Diagnosis not present

## 2023-12-27 DIAGNOSIS — Z681 Body mass index (BMI) 19 or less, adult: Secondary | ICD-10-CM | POA: Diagnosis not present

## 2023-12-27 DIAGNOSIS — Z7951 Long term (current) use of inhaled steroids: Secondary | ICD-10-CM | POA: Diagnosis not present

## 2023-12-27 DIAGNOSIS — J449 Chronic obstructive pulmonary disease, unspecified: Secondary | ICD-10-CM | POA: Diagnosis not present

## 2023-12-27 DIAGNOSIS — L89152 Pressure ulcer of sacral region, stage 2: Secondary | ICD-10-CM | POA: Diagnosis not present

## 2023-12-29 DIAGNOSIS — Z7951 Long term (current) use of inhaled steroids: Secondary | ICD-10-CM | POA: Diagnosis not present

## 2023-12-29 DIAGNOSIS — Z681 Body mass index (BMI) 19 or less, adult: Secondary | ICD-10-CM | POA: Diagnosis not present

## 2023-12-29 DIAGNOSIS — J449 Chronic obstructive pulmonary disease, unspecified: Secondary | ICD-10-CM | POA: Diagnosis not present

## 2023-12-29 DIAGNOSIS — R634 Abnormal weight loss: Secondary | ICD-10-CM | POA: Diagnosis not present

## 2023-12-29 DIAGNOSIS — C3490 Malignant neoplasm of unspecified part of unspecified bronchus or lung: Secondary | ICD-10-CM | POA: Diagnosis not present

## 2023-12-29 DIAGNOSIS — Z9181 History of falling: Secondary | ICD-10-CM | POA: Diagnosis not present

## 2023-12-29 DIAGNOSIS — K2100 Gastro-esophageal reflux disease with esophagitis, without bleeding: Secondary | ICD-10-CM | POA: Diagnosis not present

## 2023-12-29 DIAGNOSIS — Z556 Problems related to health literacy: Secondary | ICD-10-CM | POA: Diagnosis not present

## 2023-12-29 DIAGNOSIS — L89152 Pressure ulcer of sacral region, stage 2: Secondary | ICD-10-CM | POA: Diagnosis not present

## 2023-12-30 ENCOUNTER — Other Ambulatory Visit (HOSPITAL_COMMUNITY): Payer: Self-pay

## 2024-01-02 ENCOUNTER — Encounter: Payer: Self-pay | Admitting: Internal Medicine

## 2024-01-02 DIAGNOSIS — Z556 Problems related to health literacy: Secondary | ICD-10-CM | POA: Diagnosis not present

## 2024-01-02 DIAGNOSIS — R634 Abnormal weight loss: Secondary | ICD-10-CM | POA: Diagnosis not present

## 2024-01-02 DIAGNOSIS — Z681 Body mass index (BMI) 19 or less, adult: Secondary | ICD-10-CM | POA: Diagnosis not present

## 2024-01-02 DIAGNOSIS — C3490 Malignant neoplasm of unspecified part of unspecified bronchus or lung: Secondary | ICD-10-CM | POA: Diagnosis not present

## 2024-01-02 DIAGNOSIS — J449 Chronic obstructive pulmonary disease, unspecified: Secondary | ICD-10-CM | POA: Diagnosis not present

## 2024-01-02 DIAGNOSIS — K2100 Gastro-esophageal reflux disease with esophagitis, without bleeding: Secondary | ICD-10-CM | POA: Diagnosis not present

## 2024-01-02 DIAGNOSIS — Z7951 Long term (current) use of inhaled steroids: Secondary | ICD-10-CM | POA: Diagnosis not present

## 2024-01-02 DIAGNOSIS — L89152 Pressure ulcer of sacral region, stage 2: Secondary | ICD-10-CM | POA: Diagnosis not present

## 2024-01-02 DIAGNOSIS — Z9181 History of falling: Secondary | ICD-10-CM | POA: Diagnosis not present

## 2024-01-02 NOTE — Telephone Encounter (Signed)
 Open in error

## 2024-01-03 DIAGNOSIS — C3490 Malignant neoplasm of unspecified part of unspecified bronchus or lung: Secondary | ICD-10-CM | POA: Diagnosis not present

## 2024-01-03 DIAGNOSIS — Z7951 Long term (current) use of inhaled steroids: Secondary | ICD-10-CM | POA: Diagnosis not present

## 2024-01-03 DIAGNOSIS — Z9181 History of falling: Secondary | ICD-10-CM | POA: Diagnosis not present

## 2024-01-03 DIAGNOSIS — L89152 Pressure ulcer of sacral region, stage 2: Secondary | ICD-10-CM | POA: Diagnosis not present

## 2024-01-03 DIAGNOSIS — K2100 Gastro-esophageal reflux disease with esophagitis, without bleeding: Secondary | ICD-10-CM | POA: Diagnosis not present

## 2024-01-03 DIAGNOSIS — Z681 Body mass index (BMI) 19 or less, adult: Secondary | ICD-10-CM | POA: Diagnosis not present

## 2024-01-03 DIAGNOSIS — J449 Chronic obstructive pulmonary disease, unspecified: Secondary | ICD-10-CM | POA: Diagnosis not present

## 2024-01-03 DIAGNOSIS — R634 Abnormal weight loss: Secondary | ICD-10-CM | POA: Diagnosis not present

## 2024-01-03 DIAGNOSIS — Z556 Problems related to health literacy: Secondary | ICD-10-CM | POA: Diagnosis not present

## 2024-01-06 DIAGNOSIS — C3490 Malignant neoplasm of unspecified part of unspecified bronchus or lung: Secondary | ICD-10-CM | POA: Diagnosis not present

## 2024-01-06 DIAGNOSIS — Z7951 Long term (current) use of inhaled steroids: Secondary | ICD-10-CM | POA: Diagnosis not present

## 2024-01-06 DIAGNOSIS — L89152 Pressure ulcer of sacral region, stage 2: Secondary | ICD-10-CM | POA: Diagnosis not present

## 2024-01-06 DIAGNOSIS — K2100 Gastro-esophageal reflux disease with esophagitis, without bleeding: Secondary | ICD-10-CM | POA: Diagnosis not present

## 2024-01-06 DIAGNOSIS — R634 Abnormal weight loss: Secondary | ICD-10-CM | POA: Diagnosis not present

## 2024-01-06 DIAGNOSIS — Z9181 History of falling: Secondary | ICD-10-CM | POA: Diagnosis not present

## 2024-01-06 DIAGNOSIS — Z681 Body mass index (BMI) 19 or less, adult: Secondary | ICD-10-CM | POA: Diagnosis not present

## 2024-01-06 DIAGNOSIS — J449 Chronic obstructive pulmonary disease, unspecified: Secondary | ICD-10-CM | POA: Diagnosis not present

## 2024-01-06 DIAGNOSIS — Z556 Problems related to health literacy: Secondary | ICD-10-CM | POA: Diagnosis not present

## 2024-01-09 DIAGNOSIS — J449 Chronic obstructive pulmonary disease, unspecified: Secondary | ICD-10-CM | POA: Diagnosis not present

## 2024-01-09 DIAGNOSIS — L89152 Pressure ulcer of sacral region, stage 2: Secondary | ICD-10-CM | POA: Diagnosis not present

## 2024-01-09 DIAGNOSIS — K2100 Gastro-esophageal reflux disease with esophagitis, without bleeding: Secondary | ICD-10-CM | POA: Diagnosis not present

## 2024-01-09 DIAGNOSIS — C3490 Malignant neoplasm of unspecified part of unspecified bronchus or lung: Secondary | ICD-10-CM | POA: Diagnosis not present

## 2024-01-09 DIAGNOSIS — Z681 Body mass index (BMI) 19 or less, adult: Secondary | ICD-10-CM | POA: Diagnosis not present

## 2024-01-09 DIAGNOSIS — Z9181 History of falling: Secondary | ICD-10-CM | POA: Diagnosis not present

## 2024-01-09 DIAGNOSIS — Z7951 Long term (current) use of inhaled steroids: Secondary | ICD-10-CM | POA: Diagnosis not present

## 2024-01-09 DIAGNOSIS — Z556 Problems related to health literacy: Secondary | ICD-10-CM | POA: Diagnosis not present

## 2024-01-09 DIAGNOSIS — R634 Abnormal weight loss: Secondary | ICD-10-CM | POA: Diagnosis not present

## 2024-01-10 DIAGNOSIS — Z681 Body mass index (BMI) 19 or less, adult: Secondary | ICD-10-CM | POA: Diagnosis not present

## 2024-01-10 DIAGNOSIS — L89152 Pressure ulcer of sacral region, stage 2: Secondary | ICD-10-CM | POA: Diagnosis not present

## 2024-01-10 DIAGNOSIS — Z556 Problems related to health literacy: Secondary | ICD-10-CM | POA: Diagnosis not present

## 2024-01-10 DIAGNOSIS — K2100 Gastro-esophageal reflux disease with esophagitis, without bleeding: Secondary | ICD-10-CM | POA: Diagnosis not present

## 2024-01-10 DIAGNOSIS — C3490 Malignant neoplasm of unspecified part of unspecified bronchus or lung: Secondary | ICD-10-CM | POA: Diagnosis not present

## 2024-01-10 DIAGNOSIS — Z9181 History of falling: Secondary | ICD-10-CM | POA: Diagnosis not present

## 2024-01-10 DIAGNOSIS — R634 Abnormal weight loss: Secondary | ICD-10-CM | POA: Diagnosis not present

## 2024-01-10 DIAGNOSIS — J449 Chronic obstructive pulmonary disease, unspecified: Secondary | ICD-10-CM | POA: Diagnosis not present

## 2024-01-10 DIAGNOSIS — Z7951 Long term (current) use of inhaled steroids: Secondary | ICD-10-CM | POA: Diagnosis not present

## 2024-01-12 NOTE — Progress Notes (Signed)
 [facility] OFFICE PROGRESS NOTE  Benjamine Aland, MD [address] [place] [address]  DIAGNOSIS: Stage IIIb (T4, N2, M0) non-small cell lung cancer, squamous cell carcinoma presented with large right lower lobe lung mass in addition to other right upper lobe pulmonary nodule and right hilar and mediastinal lymphadenopathy in September 2024.   PRIOR THERAPY: A course of concurrent chemoradiation with weekly carboplatin  for AUC of 2 and paclitaxel  45 Mg/M2. First dose June 06, 2023. Status post 6 cycles.   CURRENT THERAPY: Consolidation with Imfinzi  1500 mg IV every 4 weeks. First dose expected on 08/29/2023. Status post 5 cycles of treatment.   INTERVAL HISTORY: Kristy Salazar 83 y.o. female returns to the clinic today for a follow-up visit accompanied by her daughters. The patient was last seen in the clinic by myself 12/20/23. She is currently on consolidation immunotherapy with Imfinzi . She is status post 4 cycles of treatment and she tolerates it well.   The patient had significant prior radiation esophagitis. She had GI dilation and her swallowing has improved. She takes megace, although they are working on obtaining insurance authorization to refill this as it was helpful. Her weight has significantly improved. She gained 10 lbs. She drinks protein drinks 2-3x per day. She states her swallowing is back to normal and she can eat.   She is also doing home health, PT, and OT. She has PT twice a week. She is more active.   No fevers, infections, or night sweats are present. She denies nausea, vomiting, diarrhea or constipation. Since she is more active, she does get short winded with activities. She denies significant cough. She sometimes gets coughing spell and uses cough syrup which is effective but denies regular cough.   She denies rashes, rashes, headaches, or vision changes.   She is followed by a member of the nutritionist team. She is here today for evaluation  and repeat blood work before undergoing cycle #6.      MEDICAL HISTORY: Past Medical History:  Diagnosis Date   Arthritis    Asthma    COPD (chronic obstructive pulmonary disease) (HCC)    Diabetes mellitus    patient denies   Hypertension    Non-small cell lung cancer (HCC)    Radiation esophagitis    Seasonal allergies     ALLERGIES:  is allergic to erythromycin.  MEDICATIONS:  Current Outpatient Medications  Medication Sig Dispense Refill   FARXIGA 10 MG TABS tablet Take 10 mg by mouth daily.     fluconazole  (DIFLUCAN ) 100 MG tablet Take 1 tablet (100 mg total) by mouth daily. 7 tablet 0   Fluticasone-Umeclidin-Vilant 100-62.5-25 MCG/INH AEPB Inhale 2 application  into the lungs daily as needed. (Patient not taking: Reported on 11/14/2023)     HYDROcodone -acetaminophen  (NORCO/VICODIN) 5-325 MG tablet Take 1 tablet by mouth every 6 (six) hours as needed for moderate pain (pain score 4-6). 15 tablet 0   KLOR-CON  20 MEQ packet Take 20 mEq by mouth daily. (Patient not taking: Reported on 11/14/2023)     megestrol (MEGACE) 40 MG/ML suspension Take 40 mg by mouth daily.     montelukast  (SINGULAIR ) 10 MG tablet Take 10 mg by mouth daily. For seasonal allergies     Multiple Vitamin (MULTIVITAMIN WITH MINERALS) TABS tablet Take 1 tablet by mouth daily.     nystatin  (MYCOSTATIN ) 100000 UNIT/ML suspension Take 5 mLs (500,000 Units total) by mouth 4 (four) times daily. 60 mL 0   pantoprazole  (PROTONIX )  40 MG tablet Take 1 tablet (40 mg total) by mouth daily. Follow up with gastroenterology for additional instructions 30 tablet 1   potassium chloride  20 MEQ/15ML (10%) SOLN Take 15 mLs (20 mEq total) by mouth 2 (two) times daily. (Patient not taking: Reported on 11/14/2023) 270 mL 0   prochlorperazine  (COMPAZINE ) 10 MG tablet TAKE 1 TABLET BY MOUTH EVERY 6 HOURS AS NEEDED FOR NAUSEA FOR VOMITING 30 tablet 0   rosuvastatin  (CRESTOR ) 10 MG tablet Take 10 mg by mouth daily.       sucralfate   (CARAFATE ) 1 g tablet Take 1 tablet (1 g total) by mouth 4 (four) times daily -  with meals and at bedtime. Crush 1 tablet in 1 oz water and drink 5 min before meals for radiation induced esophagitis 120 tablet 2   tiotropium (SPIRIVA ) 18 MCG inhalation capsule Place 18 mcg into inhaler and inhale daily.     No current facility-administered medications for this visit.   Facility-Administered Medications Ordered in Other Visits  Medication Dose Route Frequency Provider Last Rate Last Admin   0.9 %  sodium chloride  infusion   Intravenous Continuous Sherrod Sherrod, MD       durvalumab  (IMFINZI ) 1,500 mg in sodium chloride  0.9 % 100 mL chemo infusion  1,500 mg Intravenous Once Sherrod Sherrod, MD        SURGICAL HISTORY:  Past Surgical History:  Procedure Laterality Date   ABDOMINAL HYSTERECTOMY     APPENDECTOMY     BACK SURGERY     BRONCHIAL BIOPSY  05/17/2023   Procedure: BRONCHIAL BIOPSIES;  Surgeon: Shelah Lamar RAMAN, MD;  Location: [facility] ENDOSCOPY;  Service: Pulmonary;;   BRONCHIAL BRUSHINGS  05/17/2023   Procedure: BRONCHIAL BRUSHINGS;  Surgeon: Shelah Lamar RAMAN, MD;  Location: [facility] ENDOSCOPY;  Service: Pulmonary;;   BRONCHIAL NEEDLE ASPIRATION BIOPSY  05/17/2023   Procedure: BRONCHIAL NEEDLE ASPIRATION BIOPSIES;  Surgeon: Shelah Lamar RAMAN, MD;  Location: [facility] ENDOSCOPY;  Service: Pulmonary;;   ESOPHAGOGASTRODUODENOSCOPY N/A 11/14/2023   Procedure: EGD (ESOPHAGOGASTRODUODENOSCOPY);  Surgeon: Rosalie Kitchens, MD;  Location: [facility]COPY;  Service: Gastroenterology;  Laterality: N/A;   ESOPHAGOGASTRODUODENOSCOPY (EGD) WITH PROPOFOL  N/A 09/13/2023   Procedure: ESOPHAGOGASTRODUODENOSCOPY (EGD) WITH PROPOFOL ;  Surgeon: Kriss Estefana DEL, DO;  Location: [facility] ENDOSCOPY;  Service: Gastroenterology;  Laterality: N/A;   IR IMAGING GUIDED PORT INSERTION  06/03/2023   JOINT REPLACEMENT     TOTAL HIP ARTHROPLASTY     TOTAL KNEE ARTHROPLASTY     VIDEO BRONCHOSCOPY WITH ENDOBRONCHIAL ULTRASOUND Right 05/17/2023    Procedure: VIDEO BRONCHOSCOPY WITH ENDOBRONCHIAL ULTRASOUND;  Surgeon: Shelah Lamar RAMAN, MD;  Location: [facility] ENDOSCOPY;  Service: Pulmonary;  Laterality: Right;    REVIEW OF SYSTEMS:   Review of Systems  Constitutional: Positive for fatigue. Positive for weight gain and improved appetite. Negative for chills and fever.  HENT:  Improving trouble swallowing. Negative for mouth sores, nosebleeds, sore throat.  Eyes: Negative for eye problems and icterus.  Respiratory: Mild dyspnea on exertion. Negative for regular cough, hemoptysis, and wheezing.   Cardiovascular: Negative for chest pain and leg swelling.  Gastrointestinal: Negative for abdominal pain, diarrhea, constipation, nausea and vomiting.  Genitourinary: Negative for bladder incontinence, difficulty urinating, dysuria, frequency and hematuria.   Musculoskeletal: Negative for back pain, gait problem, neck pain and neck stiffness.  Skin: Negative for itching and rash.  Neurological: Negative for dizziness, extremity weakness, gait problem, headaches, light-headedness and seizures.  Hematological: Negative for adenopathy. Does not bruise/bleed easily.  Psychiatric/Behavioral: Negative for confusion, depression and  sleep disturbance. The patient is not nervous/anxious.     PHYSICAL EXAMINATION:  Blood pressure 108/63, pulse 85, temperature 98.1 F (36.7 C), temperature source Oral, resp. rate 16, height 5' 11 (1.803 m), weight 98 lb 8 oz (44.7 kg), SpO2 95%.  ECOG PERFORMANCE STATUS: 2  Physical Exam  Constitutional: Oriented to person, place, and time and thin appearing female, and in no distress.  HENT:  Head: Normocephalic and atraumatic.  Mouth/Throat: Oropharynx is clear and moist. Positive for oral thrush. No oropharyngeal exudate.  Eyes: Conjunctivae are normal. Right eye exhibits no discharge. Left eye exhibits no discharge. No scleral icterus.  Neck: Normal range of motion. Neck supple.  Cardiovascular: Normal rate, regular  rhythm, normal heart sounds and intact distal pulses.   Pulmonary/Chest: Effort normal. Quiet breath sounds bilaterally. No respiratory distress. No wheezes. No rales.  Abdominal: Soft. Bowel sounds are normal. Exhibits no distension and no mass. There is no tenderness.  Musculoskeletal: Normal range of motion. Exhibits no edema.  Lymphadenopathy:    No cervical adenopathy.  Neurological: Alert and oriented to person, place, and time. Exhibits muscle wasting. Examined in the wheelchair.  Skin: Skin is warm and dry. No rash noted. Not diaphoretic. No erythema. No pallor.  Psychiatric: Mood, memory and judgment normal.  Vitals reviewed.  LABORATORY DATA: Lab Results  Component Value Date   WBC 7.4 01/18/2024   HGB 12.2 01/18/2024   HCT 36.1 01/18/2024   MCV 95.8 01/18/2024   PLT 359 01/18/2024      Chemistry      Component Value Date/Time   NA 138 01/18/2024 0909   K 4.0 01/18/2024 0909   CL 106 01/18/2024 0909   CO2 24 01/18/2024 0909   BUN 15 01/18/2024 0909   CREATININE 0.57 01/18/2024 0909      Component Value Date/Time   CALCIUM  9.6 01/18/2024 0909   ALKPHOS 72 01/18/2024 0909   AST 21 01/18/2024 0909   ALT 24 01/18/2024 0909   BILITOT 0.5 01/18/2024 0909       RADIOGRAPHIC STUDIES:  No results found.   ASSESSMENT/PLAN:  This is a very pleasant 83 year old African-American female with stage IIIb (T4, N2, M0) non-small cell lung cancer, squamous cell carcinoma.  She presented with a large right lower lobe lung mass in addition to other right upper lobe pulmonary nodules and right hilar mediastinal lymphadenopathy.  She was diagnosed in September 2024.     PDL1 expression 1% per navigator.    She completed a course of concurrent chemoradiation with weekly carboplatin  for an AUC of 2 paclitaxel  45 mg/m. She started on 06/06/23. She is status post 7 cycles.  Her last day of treatment was on 07/18/2023.   She is currently undergoing treatment with consolidation  immunotherapy with Imfinzi  1500 mg IV every 4 weeks. She is status post 5 cycles. First dose on 08/30/23.  He did not have any appreciable side effects from treatment.  Labs were reviewed. Recommend she proceed with cycle #6 today as scheduled.   Will see her back for follow-up visit in 5 weeks (she is going out of town the week of her next infusion-will defer care plan by 1 week) for evaluation and repeat blood work before undergoing cycle #7.    I will arrange for a restaging CT scan of the chest prior to her next cycle of treatment.   Weight gain Gained 10 pounds with improved appetite and energy. Consuming Ensure protein drinks. - Continue Ensure protein drinks at least twice  daily.  Cough Intermittent, managed with cough syrup. - Use cough syrup as needed.  Continue PT and regular exercise.   Follow-up Coordination needed due to travel plans. - Order repeat CT scan of the chest 5-7 days before the next appointment. - Adjust treatment schedule for travel, next treatment on August 12.   The patient was advised to call immediately if she has any concerning symptoms in the interval. The patient voices understanding of current disease status and treatment options and is in agreement with the current care plan. All questions were answered. The patient knows to call the clinic with any problems, questions or concerns. We can certainly see the patient much sooner if necessary       No orders of the defined types were placed in this encounter.   The total time spent in the appointment was 20-29 minutes  Naina Sleeper L Gerrald Basu, PA-C 01/18/24

## 2024-01-13 ENCOUNTER — Other Ambulatory Visit: Payer: Self-pay

## 2024-01-17 DIAGNOSIS — K2100 Gastro-esophageal reflux disease with esophagitis, without bleeding: Secondary | ICD-10-CM | POA: Diagnosis not present

## 2024-01-17 DIAGNOSIS — C3490 Malignant neoplasm of unspecified part of unspecified bronchus or lung: Secondary | ICD-10-CM | POA: Diagnosis not present

## 2024-01-17 DIAGNOSIS — Z556 Problems related to health literacy: Secondary | ICD-10-CM | POA: Diagnosis not present

## 2024-01-17 DIAGNOSIS — Z681 Body mass index (BMI) 19 or less, adult: Secondary | ICD-10-CM | POA: Diagnosis not present

## 2024-01-17 DIAGNOSIS — L89152 Pressure ulcer of sacral region, stage 2: Secondary | ICD-10-CM | POA: Diagnosis not present

## 2024-01-17 DIAGNOSIS — Z9181 History of falling: Secondary | ICD-10-CM | POA: Diagnosis not present

## 2024-01-17 DIAGNOSIS — R634 Abnormal weight loss: Secondary | ICD-10-CM | POA: Diagnosis not present

## 2024-01-17 DIAGNOSIS — Z7951 Long term (current) use of inhaled steroids: Secondary | ICD-10-CM | POA: Diagnosis not present

## 2024-01-17 DIAGNOSIS — J449 Chronic obstructive pulmonary disease, unspecified: Secondary | ICD-10-CM | POA: Diagnosis not present

## 2024-01-18 ENCOUNTER — Inpatient Hospital Stay: Attending: Internal Medicine

## 2024-01-18 ENCOUNTER — Inpatient Hospital Stay: Admitting: Physician Assistant

## 2024-01-18 ENCOUNTER — Inpatient Hospital Stay

## 2024-01-18 ENCOUNTER — Inpatient Hospital Stay: Admitting: Dietician

## 2024-01-18 VITALS — BP 108/63 | HR 85 | Temp 98.1°F | Resp 16 | Ht 71.0 in | Wt 98.5 lb

## 2024-01-18 DIAGNOSIS — C3431 Malignant neoplasm of lower lobe, right bronchus or lung: Secondary | ICD-10-CM | POA: Insufficient documentation

## 2024-01-18 DIAGNOSIS — Z5112 Encounter for antineoplastic immunotherapy: Secondary | ICD-10-CM | POA: Diagnosis not present

## 2024-01-18 DIAGNOSIS — C7931 Secondary malignant neoplasm of brain: Secondary | ICD-10-CM | POA: Insufficient documentation

## 2024-01-18 DIAGNOSIS — Z95828 Presence of other vascular implants and grafts: Secondary | ICD-10-CM

## 2024-01-18 DIAGNOSIS — Z510 Encounter for antineoplastic radiation therapy: Secondary | ICD-10-CM | POA: Insufficient documentation

## 2024-01-18 DIAGNOSIS — Z79620 Long term (current) use of immunosuppressive biologic: Secondary | ICD-10-CM | POA: Diagnosis not present

## 2024-01-18 DIAGNOSIS — R059 Cough, unspecified: Secondary | ICD-10-CM | POA: Diagnosis not present

## 2024-01-18 LAB — CBC WITH DIFFERENTIAL (CANCER CENTER ONLY)
Abs Immature Granulocytes: 0.18 K/uL — ABNORMAL HIGH (ref 0.00–0.07)
Basophils Absolute: 0 K/uL (ref 0.0–0.1)
Basophils Relative: 0 %
Eosinophils Absolute: 0.1 K/uL (ref 0.0–0.5)
Eosinophils Relative: 1 %
HCT: 36.1 % (ref 36.0–46.0)
Hemoglobin: 12.2 g/dL (ref 12.0–15.0)
Immature Granulocytes: 2 %
Lymphocytes Relative: 16 %
Lymphs Abs: 1.2 K/uL (ref 0.7–4.0)
MCH: 32.4 pg (ref 26.0–34.0)
MCHC: 33.8 g/dL (ref 30.0–36.0)
MCV: 95.8 fL (ref 80.0–100.0)
Monocytes Absolute: 0.9 K/uL (ref 0.1–1.0)
Monocytes Relative: 12 %
Neutro Abs: 5.1 K/uL (ref 1.7–7.7)
Neutrophils Relative %: 69 %
Platelet Count: 359 K/uL (ref 150–400)
RBC: 3.77 MIL/uL — ABNORMAL LOW (ref 3.87–5.11)
RDW: 16.6 % — ABNORMAL HIGH (ref 11.5–15.5)
WBC Count: 7.4 K/uL (ref 4.0–10.5)
nRBC: 0 % (ref 0.0–0.2)

## 2024-01-18 LAB — CMP (CANCER CENTER ONLY)
ALT: 24 U/L (ref 0–44)
AST: 21 U/L (ref 15–41)
Albumin: 3.7 g/dL (ref 3.5–5.0)
Alkaline Phosphatase: 72 U/L (ref 38–126)
Anion gap: 8 (ref 5–15)
BUN: 15 mg/dL (ref 8–23)
CO2: 24 mmol/L (ref 22–32)
Calcium: 9.6 mg/dL (ref 8.9–10.3)
Chloride: 106 mmol/L (ref 98–111)
Creatinine: 0.57 mg/dL (ref 0.44–1.00)
GFR, Estimated: >60 mL/min (ref >60)
Glucose, Bld: 79 mg/dL (ref 70–99)
Potassium: 4 mmol/L (ref 3.5–5.1)
Sodium: 138 mmol/L (ref 135–145)
Total Bilirubin: 0.5 mg/dL (ref 0.0–1.2)
Total Protein: 7.7 g/dL (ref 6.5–8.1)

## 2024-01-18 LAB — TSH: TSH: 2.15 u[IU]/mL (ref 0.350–4.500)

## 2024-01-18 MED ORDER — SODIUM CHLORIDE 0.9% FLUSH
10.0000 mL | INTRAVENOUS | Status: DC | PRN
Start: 2024-01-18 — End: 2024-01-18
  Administered 2024-01-18: 10 mL

## 2024-01-18 MED ORDER — SODIUM CHLORIDE 0.9 % IV SOLN
1500.0000 mg | Freq: Once | INTRAVENOUS | Status: AC
  Administered 2024-01-18: 1500 mg via INTRAVENOUS
  Filled 2024-01-18: qty 30

## 2024-01-18 MED ORDER — SODIUM CHLORIDE 0.9 % IV SOLN: INTRAVENOUS | Status: DC

## 2024-01-18 MED ORDER — HEPARIN SOD (PORK) LOCK FLUSH 100 UNIT/ML IV SOLN
500.0000 [IU] | Freq: Once | INTRAVENOUS | Status: AC | PRN
  Administered 2024-01-18: 500 [IU]

## 2024-01-18 MED ORDER — SODIUM CHLORIDE 0.9% FLUSH
10.0000 mL | Freq: Once | INTRAVENOUS | Status: AC
  Administered 2024-01-18: 10 mL

## 2024-01-18 NOTE — Patient Instructions (Signed)

## 2024-01-18 NOTE — Progress Notes (Signed)
 Nutrition Follow-up:  Patient with stage III non-small cell lung cancer. Completed concurrent chemotherapy and radiation (last radiation on 1/14). Receiving imfinzi .    5/4-5/6 - admission for XRT induced esophagitis     Met with patient in infusion. Daughter is present at visit. Patient is under blankets and reports freezing. She is appreciative of RD offer for additional warm blanket. Patient reports appetite has been great. She is always eating something (3 meals + snacks) per daughter. Patient continues drinking 3 Ensure Plus. She denies NIS at this time.    Medications: reviewed   Labs: reviewed   Anthropometrics: Wt 98 lb 8 oz on 7/7 - increased   6/10 - 87 lb 12.8 oz    NUTRITION DIAGNOSIS: unintended wt loss - improved    MALNUTRITION DIAGNOSIS: severe malnutrition - ongoing, but improving   INTERVENTION:  Continue strategies for increasing calories and protein with small frequent meals/snacks  Continue 3 Ensure Plus/equivalent - samples + coupons     MONITORING, EVALUATION, GOAL: wt trends, intake   NEXT VISIT: Tuesday August 5 during infusion

## 2024-01-18 NOTE — Addendum Note (Signed)
 Addended by: CAROLEE LOA DEL on: 01/18/2024 10:16 AM   Modules accepted: Orders

## 2024-01-19 DIAGNOSIS — Z9181 History of falling: Secondary | ICD-10-CM | POA: Diagnosis not present

## 2024-01-19 DIAGNOSIS — R634 Abnormal weight loss: Secondary | ICD-10-CM | POA: Diagnosis not present

## 2024-01-19 DIAGNOSIS — C3490 Malignant neoplasm of unspecified part of unspecified bronchus or lung: Secondary | ICD-10-CM | POA: Diagnosis not present

## 2024-01-19 DIAGNOSIS — Z556 Problems related to health literacy: Secondary | ICD-10-CM | POA: Diagnosis not present

## 2024-01-19 DIAGNOSIS — Z7951 Long term (current) use of inhaled steroids: Secondary | ICD-10-CM | POA: Diagnosis not present

## 2024-01-19 DIAGNOSIS — J449 Chronic obstructive pulmonary disease, unspecified: Secondary | ICD-10-CM | POA: Diagnosis not present

## 2024-01-19 DIAGNOSIS — K2100 Gastro-esophageal reflux disease with esophagitis, without bleeding: Secondary | ICD-10-CM | POA: Diagnosis not present

## 2024-01-19 DIAGNOSIS — L89152 Pressure ulcer of sacral region, stage 2: Secondary | ICD-10-CM | POA: Diagnosis not present

## 2024-01-19 DIAGNOSIS — Z681 Body mass index (BMI) 19 or less, adult: Secondary | ICD-10-CM | POA: Diagnosis not present

## 2024-01-19 LAB — T4: T4, Total: 7.8 ug/dL (ref 4.5–12.0)

## 2024-01-20 ENCOUNTER — Telehealth: Payer: Self-pay | Admitting: Internal Medicine

## 2024-01-20 NOTE — Telephone Encounter (Signed)
 Left the patient a voicemail with the rescheduled appointment details.

## 2024-01-21 ENCOUNTER — Other Ambulatory Visit: Payer: Self-pay

## 2024-01-22 ENCOUNTER — Other Ambulatory Visit: Payer: Self-pay

## 2024-01-25 ENCOUNTER — Inpatient Hospital Stay (HOSPITAL_COMMUNITY)

## 2024-01-25 ENCOUNTER — Encounter: Payer: Self-pay | Admitting: Internal Medicine

## 2024-01-25 ENCOUNTER — Emergency Department (HOSPITAL_COMMUNITY)

## 2024-01-25 ENCOUNTER — Encounter (HOSPITAL_COMMUNITY): Payer: Self-pay | Admitting: Emergency Medicine

## 2024-01-25 ENCOUNTER — Other Ambulatory Visit: Payer: Self-pay

## 2024-01-25 ENCOUNTER — Inpatient Hospital Stay (HOSPITAL_COMMUNITY)
Admission: EM | Admit: 2024-01-25 | Discharge: 2024-01-30 | DRG: 054 | Disposition: A | Discharge status: Home / Self Care | Attending: Internal Medicine | Admitting: Internal Medicine

## 2024-01-25 ENCOUNTER — Other Ambulatory Visit (HOSPITAL_COMMUNITY)

## 2024-01-25 DIAGNOSIS — I10 Essential (primary) hypertension: Secondary | ICD-10-CM | POA: Diagnosis present

## 2024-01-25 DIAGNOSIS — C7931 Secondary malignant neoplasm of brain: Principal | ICD-10-CM | POA: Diagnosis present

## 2024-01-25 DIAGNOSIS — M50222 Other cervical disc displacement at C5-C6 level: Secondary | ICD-10-CM | POA: Diagnosis present

## 2024-01-25 DIAGNOSIS — I700 Atherosclerosis of aorta: Secondary | ICD-10-CM | POA: Diagnosis not present

## 2024-01-25 DIAGNOSIS — W19XXXA Unspecified fall, initial encounter: Secondary | ICD-10-CM | POA: Diagnosis present

## 2024-01-25 DIAGNOSIS — S065XAA Traumatic subdural hemorrhage with loss of consciousness status unknown, initial encounter: Secondary | ICD-10-CM | POA: Diagnosis not present

## 2024-01-25 DIAGNOSIS — Z9181 History of falling: Secondary | ICD-10-CM | POA: Diagnosis not present

## 2024-01-25 DIAGNOSIS — Z9221 Personal history of antineoplastic chemotherapy: Secondary | ICD-10-CM | POA: Diagnosis not present

## 2024-01-25 DIAGNOSIS — Z85118 Personal history of other malignant neoplasm of bronchus and lung: Secondary | ICD-10-CM | POA: Diagnosis not present

## 2024-01-25 DIAGNOSIS — Z881 Allergy status to other antibiotic agents status: Secondary | ICD-10-CM

## 2024-01-25 DIAGNOSIS — R000 Tachycardia, unspecified: Secondary | ICD-10-CM | POA: Diagnosis not present

## 2024-01-25 DIAGNOSIS — Z043 Encounter for examination and observation following other accident: Secondary | ICD-10-CM | POA: Diagnosis not present

## 2024-01-25 DIAGNOSIS — J449 Chronic obstructive pulmonary disease, unspecified: Secondary | ICD-10-CM | POA: Diagnosis not present

## 2024-01-25 DIAGNOSIS — Z90710 Acquired absence of both cervix and uterus: Secondary | ICD-10-CM

## 2024-01-25 DIAGNOSIS — Z96659 Presence of unspecified artificial knee joint: Secondary | ICD-10-CM | POA: Diagnosis not present

## 2024-01-25 DIAGNOSIS — L89152 Pressure ulcer of sacral region, stage 2: Secondary | ICD-10-CM | POA: Diagnosis not present

## 2024-01-25 DIAGNOSIS — C3431 Malignant neoplasm of lower lobe, right bronchus or lung: Secondary | ICD-10-CM | POA: Diagnosis present

## 2024-01-25 DIAGNOSIS — S0990XA Unspecified injury of head, initial encounter: Secondary | ICD-10-CM | POA: Diagnosis not present

## 2024-01-25 DIAGNOSIS — R296 Repeated falls: Secondary | ICD-10-CM | POA: Diagnosis present

## 2024-01-25 DIAGNOSIS — E119 Type 2 diabetes mellitus without complications: Secondary | ICD-10-CM | POA: Diagnosis present

## 2024-01-25 DIAGNOSIS — J4489 Other specified chronic obstructive pulmonary disease: Secondary | ICD-10-CM | POA: Diagnosis not present

## 2024-01-25 DIAGNOSIS — Z7951 Long term (current) use of inhaled steroids: Secondary | ICD-10-CM | POA: Diagnosis not present

## 2024-01-25 DIAGNOSIS — Z923 Personal history of irradiation: Secondary | ICD-10-CM

## 2024-01-25 DIAGNOSIS — G936 Cerebral edema: Secondary | ICD-10-CM | POA: Diagnosis present

## 2024-01-25 DIAGNOSIS — R636 Underweight: Secondary | ICD-10-CM | POA: Diagnosis present

## 2024-01-25 DIAGNOSIS — S199XXA Unspecified injury of neck, initial encounter: Secondary | ICD-10-CM | POA: Diagnosis not present

## 2024-01-25 DIAGNOSIS — C3490 Malignant neoplasm of unspecified part of unspecified bronchus or lung: Secondary | ICD-10-CM | POA: Diagnosis not present

## 2024-01-25 DIAGNOSIS — F17210 Nicotine dependence, cigarettes, uncomplicated: Secondary | ICD-10-CM | POA: Diagnosis not present

## 2024-01-25 DIAGNOSIS — Z681 Body mass index (BMI) 19 or less, adult: Secondary | ICD-10-CM | POA: Diagnosis not present

## 2024-01-25 DIAGNOSIS — C801 Malignant (primary) neoplasm, unspecified: Secondary | ICD-10-CM | POA: Diagnosis not present

## 2024-01-25 DIAGNOSIS — R0902 Hypoxemia: Secondary | ICD-10-CM | POA: Diagnosis not present

## 2024-01-25 DIAGNOSIS — Z96649 Presence of unspecified artificial hip joint: Secondary | ICD-10-CM | POA: Diagnosis present

## 2024-01-25 DIAGNOSIS — I6200 Nontraumatic subdural hemorrhage, unspecified: Secondary | ICD-10-CM | POA: Diagnosis not present

## 2024-01-25 DIAGNOSIS — K2100 Gastro-esophageal reflux disease with esophagitis, without bleeding: Secondary | ICD-10-CM | POA: Diagnosis not present

## 2024-01-25 DIAGNOSIS — J439 Emphysema, unspecified: Secondary | ICD-10-CM | POA: Diagnosis not present

## 2024-01-25 DIAGNOSIS — R079 Chest pain, unspecified: Secondary | ICD-10-CM | POA: Diagnosis not present

## 2024-01-25 DIAGNOSIS — M4802 Spinal stenosis, cervical region: Secondary | ICD-10-CM | POA: Diagnosis present

## 2024-01-25 DIAGNOSIS — Z556 Problems related to health literacy: Secondary | ICD-10-CM | POA: Diagnosis not present

## 2024-01-25 DIAGNOSIS — R634 Abnormal weight loss: Secondary | ICD-10-CM | POA: Diagnosis not present

## 2024-01-25 LAB — COMPREHENSIVE METABOLIC PANEL WITH GFR
ALT: 42 U/L (ref 0–44)
AST: 35 U/L (ref 15–41)
Albumin: 2.8 g/dL — ABNORMAL LOW (ref 3.5–5.0)
Alkaline Phosphatase: 108 U/L (ref 38–126)
Anion gap: 12 (ref 5–15)
BUN: 13 mg/dL (ref 8–23)
CO2: 19 mmol/L — ABNORMAL LOW (ref 22–32)
Calcium: 9.3 mg/dL (ref 8.9–10.3)
Chloride: 105 mmol/L (ref 98–111)
Creatinine, Ser: 0.58 mg/dL (ref 0.44–1.00)
GFR, Estimated: >60 mL/min (ref >60)
Glucose, Bld: 89 mg/dL (ref 70–99)
Potassium: 3.6 mmol/L (ref 3.5–5.1)
Sodium: 136 mmol/L (ref 135–145)
Total Bilirubin: 0.6 mg/dL (ref 0.0–1.2)
Total Protein: 7.7 g/dL (ref 6.5–8.1)

## 2024-01-25 LAB — CBC WITH DIFFERENTIAL/PLATELET
Abs Immature Granulocytes: 0.21 K/uL — ABNORMAL HIGH (ref 0.00–0.07)
Basophils Absolute: 0 K/uL (ref 0.0–0.1)
Basophils Relative: 1 %
Eosinophils Absolute: 0 K/uL (ref 0.0–0.5)
Eosinophils Relative: 0 %
HCT: 34.3 % — ABNORMAL LOW (ref 36.0–46.0)
Hemoglobin: 11.3 g/dL — ABNORMAL LOW (ref 12.0–15.0)
Immature Granulocytes: 2 %
Lymphocytes Relative: 9 %
Lymphs Abs: 0.7 K/uL (ref 0.7–4.0)
MCH: 31.6 pg (ref 26.0–34.0)
MCHC: 32.9 g/dL (ref 30.0–36.0)
MCV: 95.8 fL (ref 80.0–100.0)
Monocytes Absolute: 0.7 K/uL (ref 0.1–1.0)
Monocytes Relative: 8 %
Neutro Abs: 7 K/uL (ref 1.7–7.7)
Neutrophils Relative %: 80 %
Platelets: 309 K/uL (ref 150–400)
RBC: 3.58 MIL/uL — ABNORMAL LOW (ref 3.87–5.11)
RDW: 15.5 % (ref 11.5–15.5)
WBC: 8.7 K/uL (ref 4.0–10.5)
nRBC: 0 % (ref 0.0–0.2)

## 2024-01-25 LAB — TROPONIN I (HIGH SENSITIVITY): Troponin I (High Sensitivity): 9 ng/L (ref <18)

## 2024-01-25 LAB — URINALYSIS, W/ REFLEX TO CULTURE (INFECTION SUSPECTED)
Bilirubin Urine: NEGATIVE
Glucose, UA: >=500 mg/dL — AB
Hgb urine dipstick: NEGATIVE
Ketones, ur: NEGATIVE mg/dL
Leukocytes,Ua: NEGATIVE
Nitrite: NEGATIVE
Protein, ur: NEGATIVE mg/dL
Specific Gravity, Urine: 1.011 (ref 1.005–1.030)
pH: 6 (ref 5.0–8.0)

## 2024-01-25 LAB — MAGNESIUM: Magnesium: 2.3 mg/dL (ref 1.7–2.4)

## 2024-01-25 MED ORDER — POLYETHYLENE GLYCOL 3350 17 G PO PACK: 17.0000 g | PACK | Freq: Every day | ORAL | Status: DC | PRN

## 2024-01-25 MED ORDER — UMECLIDINIUM BROMIDE 62.5 MCG/ACT IN AEPB
1.0000 | INHALATION_SPRAY | Freq: Every day | RESPIRATORY_TRACT | Status: DC
  Administered 2024-01-26 – 2024-01-30 (×5): 1 via RESPIRATORY_TRACT
  Filled 2024-01-25 (×2): qty 7

## 2024-01-25 MED ORDER — INSULIN ASPART 100 UNIT/ML IJ SOLN
0.0000 [IU] | Freq: Three times a day (TID) | INTRAMUSCULAR | Status: DC
  Administered 2024-01-26 (×3): 2 [IU] via SUBCUTANEOUS
  Administered 2024-01-27: 5 [IU] via SUBCUTANEOUS
  Administered 2024-01-27: 2 [IU] via SUBCUTANEOUS
  Administered 2024-01-27 – 2024-01-28 (×4): 1 [IU] via SUBCUTANEOUS
  Administered 2024-01-29 (×2): 3 [IU] via SUBCUTANEOUS
  Administered 2024-01-29: 1 [IU] via SUBCUTANEOUS

## 2024-01-25 MED ORDER — MONTELUKAST SODIUM 10 MG PO TABS
10.0000 mg | ORAL_TABLET | Freq: Every day | ORAL | Status: DC
  Administered 2024-01-26 – 2024-01-30 (×5): 10 mg via ORAL
  Filled 2024-01-25 (×5): qty 1

## 2024-01-25 MED ORDER — GADOBUTROL 1 MMOL/ML IV SOLN
5.0000 mL | Freq: Once | INTRAVENOUS | Status: AC | PRN
  Administered 2024-01-25: 5 mL via INTRAVENOUS

## 2024-01-25 MED ORDER — ROSUVASTATIN CALCIUM 5 MG PO TABS
10.0000 mg | ORAL_TABLET | Freq: Every day | ORAL | Status: DC
  Administered 2024-01-26 – 2024-01-30 (×5): 10 mg via ORAL
  Filled 2024-01-25 (×5): qty 2

## 2024-01-25 MED ORDER — SODIUM CHLORIDE 0.9% FLUSH
3.0000 mL | Freq: Two times a day (BID) | INTRAVENOUS | Status: DC
  Administered 2024-01-25 – 2024-01-30 (×10): 3 mL via INTRAVENOUS

## 2024-01-25 MED ORDER — ACETAMINOPHEN 650 MG RE SUPP: 650.0000 mg | Freq: Four times a day (QID) | RECTAL | Status: DC | PRN

## 2024-01-25 MED ORDER — DEXAMETHASONE SODIUM PHOSPHATE 4 MG/ML IJ SOLN
4.0000 mg | Freq: Four times a day (QID) | INTRAMUSCULAR | Status: DC
  Administered 2024-01-25 – 2024-01-26 (×3): 4 mg via INTRAVENOUS
  Filled 2024-01-25 (×3): qty 1

## 2024-01-25 MED ORDER — IOHEXOL 350 MG/ML SOLN
40.0000 mL | Freq: Once | INTRAVENOUS | Status: AC | PRN
  Administered 2024-01-25: 40 mL via INTRAVENOUS

## 2024-01-25 MED ORDER — ACETAMINOPHEN 325 MG PO TABS: 650.0000 mg | ORAL_TABLET | Freq: Four times a day (QID) | ORAL | Status: DC | PRN

## 2024-01-25 MED ORDER — GUAIFENESIN 100 MG/5ML PO LIQD
5.0000 mL | ORAL | Status: DC | PRN
  Administered 2024-01-25: 5 mL via ORAL
  Filled 2024-01-25: qty 10

## 2024-01-25 MED ORDER — ACETAMINOPHEN 500 MG PO TABS
1000.0000 mg | ORAL_TABLET | Freq: Once | ORAL | Status: AC
  Administered 2024-01-25: 1000 mg via ORAL
  Filled 2024-01-25: qty 2

## 2024-01-25 NOTE — Progress Notes (Signed)
 Patient ID: Kristy Salazar, female   DOB: 1941-07-01, 83 y.o.   MRN: [id number] I have looked at records, this is a non small cell CA. Will follow

## 2024-01-25 NOTE — ED Notes (Signed)
 Family member was given warm blankets.

## 2024-01-25 NOTE — Consult Note (Signed)
 Reason for Consult:acute subdural hematoma left, multiple intracerebral masses occipital lobes bilaterally with surrounding edema Referring Physician: Mannie Fairy Arlean FORBES Kristy is an 83 y.o. female.  HPI: with a history of small cell lung CA for which she just finished treatment in Jan 2025. She has had 2-3 falls recently, most recently this morning according to her daughter. Head CT showed two cerebral lesions with surrounding edema. The scan also showed an acute small left subdural hematoma.  Past Medical History:  Diagnosis Date   Arthritis    Asthma    COPD (chronic obstructive pulmonary disease) (HCC)    Diabetes mellitus    patient denies   Hypertension    Non-small cell lung cancer (HCC)    Radiation esophagitis    Seasonal allergies     Past Surgical History:  Procedure Laterality Date   ABDOMINAL HYSTERECTOMY     APPENDECTOMY     BACK SURGERY     BRONCHIAL BIOPSY  05/17/2023   Procedure: BRONCHIAL BIOPSIES;  Surgeon: Shelah Lamar RAMAN, MD;  Location: [facility] ENDOSCOPY;  Service: Pulmonary;;   BRONCHIAL BRUSHINGS  05/17/2023   Procedure: BRONCHIAL BRUSHINGS;  Surgeon: Shelah Lamar RAMAN, MD;  Location: [facility] ENDOSCOPY;  Service: Pulmonary;;   BRONCHIAL NEEDLE ASPIRATION BIOPSY  05/17/2023   Procedure: BRONCHIAL NEEDLE ASPIRATION BIOPSIES;  Surgeon: Shelah Lamar RAMAN, MD;  Location: [facility] ENDOSCOPY;  Service: Pulmonary;;   ESOPHAGOGASTRODUODENOSCOPY N/A 11/14/2023   Procedure: EGD (ESOPHAGOGASTRODUODENOSCOPY);  Surgeon: Rosalie Kitchens, MD;  Location: [facility];  Service: Gastroenterology;  Laterality: N/A;   ESOPHAGOGASTRODUODENOSCOPY (EGD) WITH PROPOFOL  N/A 09/13/2023   Procedure: ESOPHAGOGASTRODUODENOSCOPY (EGD) WITH PROPOFOL ;  Surgeon: Kriss Estefana DEL, DO;  Location: [facility] ENDOSCOPY;  Service: Gastroenterology;  Laterality: N/A;   IR IMAGING GUIDED PORT INSERTION  06/03/2023   JOINT REPLACEMENT     TOTAL HIP ARTHROPLASTY     TOTAL KNEE ARTHROPLASTY     VIDEO BRONCHOSCOPY WITH  ENDOBRONCHIAL ULTRASOUND Right 05/17/2023   Procedure: VIDEO BRONCHOSCOPY WITH ENDOBRONCHIAL ULTRASOUND;  Surgeon: Shelah Lamar RAMAN, MD;  Location: [facility] ENDOSCOPY;  Service: Pulmonary;  Laterality: Right;    History reviewed. No pertinent family history.  Social History:  reports that she has been smoking cigarettes. She has a 12.5 pack-year smoking history. She has never used smokeless tobacco. She reports that she does not drink alcohol and does not use drugs.  Allergies:  Allergies  Allergen Reactions   Erythromycin Hives    Medications: I have reviewed the patient's current medications.  Results for orders placed or performed during the hospital encounter of 01/25/24 (from the past 48 hours)  CBC with Differential/Platelet     Status: Abnormal   Collection Time: 01/25/24  2:03 PM  Result Value Ref Range   WBC 8.7 4.0 - 10.5 K/uL   RBC 3.58 (L) 3.87 - 5.11 MIL/uL   Hemoglobin 11.3 (L) 12.0 - 15.0 g/dL   HCT 65.6 (L) 63.9 - 53.9 %   MCV 95.8 80.0 - 100.0 fL   MCH 31.6 26.0 - 34.0 pg   MCHC 32.9 30.0 - 36.0 g/dL   RDW 84.4 88.4 - 84.4 %   Platelets 309 150 - 400 K/uL   nRBC 0.0 0.0 - 0.2 %   Neutrophils Relative % 80 %   Neutro Abs 7.0 1.7 - 7.7 K/uL   Lymphocytes Relative 9 %   Lymphs Abs 0.7 0.7 - 4.0 K/uL   Monocytes Relative 8 %   Monocytes Absolute 0.7 0.1 - 1.0 K/uL   Eosinophils Relative 0 %  Eosinophils Absolute 0.0 0.0 - 0.5 K/uL   Basophils Relative 1 %   Basophils Absolute 0.0 0.0 - 0.1 K/uL   Immature Granulocytes 2 %   Abs Immature Granulocytes 0.21 (H) 0.00 - 0.07 K/uL    Comment: Performed at [facility], [address] [place], [place] [address]  Comprehensive metabolic panel with GFR     Status: Abnormal   Collection Time: 01/25/24  2:03 PM  Result Value Ref Range   Sodium 136 135 - 145 mmol/L   Potassium 3.6 3.5 - 5.1 mmol/L   Chloride 105 98 - 111 mmol/L   CO2 19 (L) 22 - 32 mmol/L   Glucose, Bld 89 70 - 99 mg/dL    Comment: Glucose  reference range applies only to samples taken after fasting for at least 8 hours.   BUN 13 8 - 23 mg/dL   Creatinine, Ser 9.41 0.44 - 1.00 mg/dL   Calcium  9.3 8.9 - 10.3 mg/dL   Total Protein 7.7 6.5 - 8.1 g/dL   Albumin 2.8 (L) 3.5 - 5.0 g/dL   AST 35 15 - 41 U/L   ALT 42 0 - 44 U/L   Alkaline Phosphatase 108 38 - 126 U/L   Total Bilirubin 0.6 0.0 - 1.2 mg/dL   GFR, Estimated >39 >39 mL/min    Comment: (NOTE) Calculated using the CKD-EPI Creatinine Equation (2021)    Anion gap 12 5 - 15    Comment: Performed at [facility], [address] [place], [place] [address]  Troponin I (High Sensitivity)     Status: None   Collection Time: 01/25/24  2:03 PM  Result Value Ref Range   Troponin I (High Sensitivity) 9 <18 ng/L    Comment: (NOTE) Elevated high sensitivity troponin I (hsTnI) values and significant  changes across serial measurements may suggest ACS but many other  chronic and acute conditions are known to elevate hsTnI results.  Refer to the Links section for chest pain algorithms and additional  guidance. Performed at [facility], [address] [place], [place] [address]   Magnesium      Status: None   Collection Time: 01/25/24  2:03 PM  Result Value Ref Range   Magnesium  2.3 1.7 - 2.4 mg/dL    Comment: Performed at [facility], [address], [place], [place] [address]    CT Cervical Spine Wo Contrast Result Date: 01/25/2024 CLINICAL DATA:  Neck trauma (Age >= 65y) EXAM: CT CERVICAL SPINE WITHOUT CONTRAST TECHNIQUE: Multidetector CT imaging of the cervical spine was performed without intravenous contrast. Multiplanar CT image reconstructions were also generated. RADIATION DOSE REDUCTION: This exam was performed according to the departmental dose-optimization program which includes automated exposure control, adjustment of the mA and/or kV according to patient size and/or use of iterative reconstruction technique. COMPARISON:  None Available.  FINDINGS: Alignment: No subluxation. Slight degenerative anterolisthesis of C3 on C4 and retrolisthesis of C5 on C6. Skull base and vertebrae: No acute fracture. No primary bone lesion or focal pathologic process. Soft tissues and spinal canal: No prevertebral fluid or swelling. No visible canal hematoma. Disc levels: Moderate degenerative disc disease in the lower cervical spine. Advanced bilateral degenerative facet disease diffusely. Multilevel bilateral neural foraminal narrowing. Focal disc herniation centrally at C5-6 causing central spinal stenosis. Upper chest: Biapical scarring.  Emphysema. Other: None IMPRESSION: Degenerative disc and facet disease as described above. Central disc herniation at C5-6 with central spinal stenosis. No acute bony abnormality.  Electronically Signed   By: Franky Crease M.D.   On: 01/25/2024 15:33   CT Head Wo Contrast Result Date: 01/25/2024 CLINICAL DATA:  Head trauma, moderate-severe EXAM: CT HEAD WITHOUT CONTRAST TECHNIQUE: Contiguous axial images were obtained from the base of the skull through the vertex without intravenous contrast. RADIATION DOSE REDUCTION: This exam was performed according to the departmental dose-optimization program which includes automated exposure control, adjustment of the mA and/or kV according to patient size and/or use of iterative reconstruction technique. COMPARISON:  None Available. FINDINGS: Brain: There appear to be bilateral posterior parietal mass lesions, measuring approximately 2.7 cm on the left with areas of hemorrhage noted, and measuring approximately 1.3 cm on the right. Surrounding vasogenic edema in both parietal lobes. Also noted is a small left subdural hematoma measuring 4-5 mm in thickness. No significant mass effect or midline shift. No hydrocephalus. Vascular: No hyperdense vessel or unexpected calcification. Skull: No acute calvarial abnormality. Sinuses/Orbits: No acute findings Other: None IMPRESSION: Concern for  bilateral posterior parietal mass lesions, left larger than right. Associated areas of hemorrhage within and surrounding the left posterior parietal mass lesion along with 4-5 mm left subdural hematoma. Associated vasogenic edema throughout the parietal lobes bilaterally. Critical Value/emergent results were called by telephone at the time of interpretation on 01/25/2024 at 3:29 pm to provider WHITNEY PLUNKETT , who verbally acknowledged these results. Electronically Signed   By: Franky Crease M.D.   On: 01/25/2024 15:29   DG Chest 2 View Result Date: 01/25/2024 CLINICAL DATA:  Recurrent falls. EXAM: CHEST - 2 VIEW COMPARISON:  11/13/2022. FINDINGS: Bilateral lungs appear hyperexpanded and hyperlucent with coarse bronchovascular markings, in keeping with COPD. Bilateral lungs otherwise appear clear. No dense consolidation or lung collapse. Bilateral costophrenic angles are clear. Normal cardio-mediastinal silhouette. No acute osseous abnormalities. The soft tissues are within normal limits. Right-sided CT Port-A-Cath is seen with its tip overlying the cavoatrial junction region. IMPRESSION: No active cardiopulmonary disease. COPD. Electronically Signed   By: Ree Molt M.D.   On: 01/25/2024 13:22    Review of Systems Blood pressure 122/79, pulse (!) 107, temperature 97.9 F (36.6 C), temperature source Oral, resp. rate (!) 31, height 5' 11 (1.803 m), weight 44.7 kg, SpO2 93%. Physical Exam Constitutional:      Appearance: She is ill-appearing.  HENT:     Head: Normocephalic.     Right Ear: External ear normal.     Left Ear: External ear normal.     Nose: Nose normal.     Mouth/Throat:     Mouth: Mucous membranes are moist.     Pharynx: Oropharynx is clear.  Eyes:     Extraocular Movements: Extraocular movements intact.     Pupils: Pupils are equal, round, and reactive to light.  Pulmonary:     Effort: Pulmonary effort is normal.  Abdominal:     General: Abdomen is flat.     Palpations:  Abdomen is soft.  Musculoskeletal:        General: Normal range of motion.     Cervical back: Normal range of motion and neck supple.  Skin:    General: Skin is warm and dry.  Neurological:     General: No focal deficit present.     Mental Status: She is alert and oriented to person, place, and time. Mental status is at baseline.     Cranial Nerves: No cranial nerve deficit.     Sensory: Sensation is intact.     Motor: Motor function  is intact. No weakness.     Comments: Gait not assessed       Assessment/Plan: Kristy Salazar is a 83 y.o. female With known small cell lung CA. The subdural is very small and causing no midline shift. Do not anticipate she will need operative treatment for this. Recommend decadron  6q6, chest abdomen and pelvic CT, and consult to Oncology.   Rockey Peru 01/25/2024, 3:54 PM

## 2024-01-25 NOTE — H&P (Addendum)
 History and Physical   INFINITI HOEFLING FMW:[id number] DOB: 12/17/1940 DOA: 01/25/2024  PCP: Benjamine Aland, MD   Patient coming from: Home  Chief Complaint: Falls  HPI: Kristy Salazar is a 83 y.o. female with medical history significant of diabetes, lung cancer, COPD, radiation esophagitis presenting after falls at home.  Patient reports 2 days of falls without pattern or trigger at home.  States that she falls unexpectedly while walking and has had 4 falls both backwards and forward.  Has hit her head several times.  Denies fevers, chills, chest pain, shortness of breath, abdominal pain, constipation, diarrhea, nausea, vomiting.  ED Course: Vital signs in ED notable for heart rate in the 100s, respiratory in the 20s-30s, saturating well on room air.  Lab workup included CMP with bicarb 19, albumin 2.8.  CBC with hemoglobin stable 11.3.  Troponin negative x 2.  Magnesium  normal.  Chest x-ray showed no acute abnormality.  CT head showed bilateral parietal masses left greater than right.  Areas of associated hemorrhage and a 4 to 5 mm left subdural hematoma.  Also noted was associated vasogenic edema.  CT C-spine showed degenerative disc disease, C5-6 disc herniation with spinal stenosis, no acute dramality.  Neurosurgery consulted and have seen the patient right now patient does not need surgical intervention and they have recommended steroids.  Medical oncology consulted who agreed with steroids and noted that radiation oncology will likely need to be involved.  Patient started on steroids in the ED.  Review of Systems: As per HPI otherwise all other systems reviewed and are negative.  Past Medical History:  Diagnosis Date   Arthritis    Aspiration pneumonia (HCC) 09/09/2023   Asthma    COPD (chronic obstructive pulmonary disease) (HCC)    Diabetes mellitus    patient denies   Hypertension    Non-small cell lung cancer (HCC)    Radiation esophagitis    Seasonal allergies      Past Surgical History:  Procedure Laterality Date   ABDOMINAL HYSTERECTOMY     APPENDECTOMY     BACK SURGERY     BRONCHIAL BIOPSY  05/17/2023   Procedure: BRONCHIAL BIOPSIES;  Surgeon: Shelah Lamar RAMAN, MD;  Location: [facility] ENDOSCOPY;  Service: Pulmonary;;   BRONCHIAL BRUSHINGS  05/17/2023   Procedure: BRONCHIAL BRUSHINGS;  Surgeon: Shelah Lamar RAMAN, MD;  Location: [facility] ENDOSCOPY;  Service: Pulmonary;;   BRONCHIAL NEEDLE ASPIRATION BIOPSY  05/17/2023   Procedure: BRONCHIAL NEEDLE ASPIRATION BIOPSIES;  Surgeon: Shelah Lamar RAMAN, MD;  Location: [facility] ENDOSCOPY;  Service: Pulmonary;;   ESOPHAGOGASTRODUODENOSCOPY N/A 11/14/2023   Procedure: EGD (ESOPHAGOGASTRODUODENOSCOPY);  Surgeon: Rosalie Kitchens, MD;  Location: [facility] ENDOSCOPY;  Service: Gastroenterology;  Laterality: N/A;   ESOPHAGOGASTRODUODENOSCOPY (EGD) WITH PROPOFOL  N/A 09/13/2023   Procedure: ESOPHAGOGASTRODUODENOSCOPY (EGD) WITH PROPOFOL ;  Surgeon: Kriss Estefana DEL, DO;  Location: [facility] ENDOSCOPY;  Service: Gastroenterology;  Laterality: N/A;   IR IMAGING GUIDED PORT INSERTION  06/03/2023   JOINT REPLACEMENT     TOTAL HIP ARTHROPLASTY     TOTAL KNEE ARTHROPLASTY     VIDEO BRONCHOSCOPY WITH ENDOBRONCHIAL ULTRASOUND Right 05/17/2023   Procedure: VIDEO BRONCHOSCOPY WITH ENDOBRONCHIAL ULTRASOUND;  Surgeon: Shelah Lamar RAMAN, MD;  Location: [facility] ENDOSCOPY;  Service: Pulmonary;  Laterality: Right;    Social History  reports that she has been smoking cigarettes. She has a 12.5 pack-year smoking history. She has never used smokeless tobacco. She reports that she does not drink alcohol and does not use drugs.  Allergies  Allergen Reactions  Erythromycin Hives    History reviewed. No pertinent family history.  Prior to Admission medications   Medication Sig Start Date End Date Taking? Authorizing Provider  FARXIGA 10 MG TABS tablet Take 10 mg by mouth daily. 07/27/23   [provider]  fluconazole  (DIFLUCAN ) 100 MG tablet Take 1 tablet (100 mg  total) by mouth daily. 11/21/23   Sherrod Sherrod, MD  Fluticasone-Umeclidin-Vilant 100-62.5-25 MCG/INH AEPB Inhale 2 application  into the lungs daily as needed.    [provider]  HYDROcodone -acetaminophen  (NORCO/VICODIN) 5-325 MG tablet Take 1 tablet by mouth every 6 (six) hours as needed for moderate pain (pain score 4-6). Patient not taking: Reported on 01/18/2024 12/22/23   Heilingoetter, Cassandra L, PA-C  megestrol (MEGACE) 40 MG/ML suspension Take 40 mg by mouth daily. Patient not taking: Reported on 01/18/2024 08/31/23   [provider]  montelukast  (SINGULAIR ) 10 MG tablet Take 10 mg by mouth daily. For seasonal allergies    [provider]  Multiple Vitamin (MULTIVITAMIN WITH MINERALS) TABS tablet Take 1 tablet by mouth daily.    [provider]  potassium chloride  20 MEQ/15ML (10%) SOLN Take 15 mLs (20 mEq total) by mouth 2 (two) times daily. Patient not taking: Reported on 11/14/2023 09/27/23   Heilingoetter, Cassandra L, PA-C  prochlorperazine  (COMPAZINE ) 10 MG tablet TAKE 1 TABLET BY MOUTH EVERY 6 HOURS AS NEEDED FOR NAUSEA FOR VOMITING 08/30/23   Sherrod Sherrod, MD  rosuvastatin  (CRESTOR ) 10 MG tablet Take 10 mg by mouth daily.      [provider]  sucralfate  (CARAFATE ) 1 GM/10ML suspension Take 1 g by mouth 4 (four) times daily -  with meals and at bedtime.    [provider]  tiotropium (SPIRIVA ) 18 MCG inhalation capsule Place 18 mcg into inhaler and inhale daily.    [provider]    Physical Exam: Vitals:   01/25/24 1352 01/25/24 1415 01/25/24 1500 01/25/24 1622  BP:  137/84 122/79 133/88  Pulse:  (!) 106 (!) 107 (!) 106  Resp:  (!) 29 (!) 31 20  Temp:      TempSrc:      SpO2:  94% 93% 92%  Weight: 44.7 kg     Height: 5' 11 (1.803 m)       Physical Exam Constitutional:      General: She is not in acute distress.    Appearance: She is ill-appearing.  HENT:     Head: Normocephalic and atraumatic.      Mouth/Throat:     Mouth: Mucous membranes are moist.     Pharynx: Oropharynx is clear.  Eyes:     Extraocular Movements: Extraocular movements intact.     Pupils: Pupils are equal, round, and reactive to light.  Cardiovascular:     Rate and Rhythm: Regular rhythm. Tachycardia present.     Pulses: Normal pulses.     Heart sounds: Normal heart sounds.  Pulmonary:     Effort: Pulmonary effort is normal. No respiratory distress.     Breath sounds: Normal breath sounds.  Abdominal:     General: Bowel sounds are normal. There is no distension.     Palpations: Abdomen is soft.     Tenderness: There is no abdominal tenderness.  Musculoskeletal:        General: No swelling or deformity.  Skin:    General: Skin is warm and dry.  Neurological:     General: No focal deficit present.     Mental Status: Mental status  is at baseline.    Labs on Admission: I have personally reviewed following labs and imaging studies  CBC: Recent Labs  Lab 01/25/24 1403  WBC 8.7  NEUTROABS 7.0  HGB 11.3*  HCT 34.3*  MCV 95.8  PLT 309    Basic Metabolic Panel: Recent Labs  Lab 01/25/24 1403  NA 136  K 3.6  CL 105  CO2 19*  GLUCOSE 89  BUN 13  CREATININE 0.58  CALCIUM  9.3  MG 2.3    GFR: Estimated Creatinine Clearance: 39.6 mL/min (by C-G formula based on SCr of 0.58 mg/dL).  Liver Function Tests: Recent Labs  Lab 01/25/24 1403  AST 35  ALT 42  ALKPHOS 108  BILITOT 0.6  PROT 7.7  ALBUMIN 2.8*    Urine analysis:    Component Value Date/Time   COLORURINE YELLOW 01/25/2024 1619   APPEARANCEUR CLEAR 01/25/2024 1619   LABSPEC 1.011 01/25/2024 1619   PHURINE 6.0 01/25/2024 1619   GLUCOSEU >=500 (A) 01/25/2024 1619   HGBUR NEGATIVE 01/25/2024 1619   BILIRUBINUR NEGATIVE 01/25/2024 1619   KETONESUR NEGATIVE 01/25/2024 1619   PROTEINUR NEGATIVE 01/25/2024 1619   UROBILINOGEN 0.2 05/10/2011 0930   NITRITE NEGATIVE 01/25/2024 1619   LEUKOCYTESUR NEGATIVE 01/25/2024 1619     Radiological Exams on Admission: CT Cervical Spine Wo Contrast Result Date: 01/25/2024 CLINICAL DATA:  Neck trauma (Age >= 65y) EXAM: CT CERVICAL SPINE WITHOUT CONTRAST TECHNIQUE: Multidetector CT imaging of the cervical spine was performed without intravenous contrast. Multiplanar CT image reconstructions were also generated. RADIATION DOSE REDUCTION: This exam was performed according to the departmental dose-optimization program which includes automated exposure control, adjustment of the mA and/or kV according to patient size and/or use of iterative reconstruction technique. COMPARISON:  None Available. FINDINGS: Alignment: No subluxation. Slight degenerative anterolisthesis of C3 on C4 and retrolisthesis of C5 on C6. Skull base and vertebrae: No acute fracture. No primary bone lesion or focal pathologic process. Soft tissues and spinal canal: No prevertebral fluid or swelling. No visible canal hematoma. Disc levels: Moderate degenerative disc disease in the lower cervical spine. Advanced bilateral degenerative facet disease diffusely. Multilevel bilateral neural foraminal narrowing. Focal disc herniation centrally at C5-6 causing central spinal stenosis. Upper chest: Biapical scarring.  Emphysema. Other: None IMPRESSION: Degenerative disc and facet disease as described above. Central disc herniation at C5-6 with central spinal stenosis. No acute bony abnormality. Electronically Signed   By: Franky Crease M.D.   On: 01/25/2024 15:33   CT Head Wo Contrast Result Date: 01/25/2024 CLINICAL DATA:  Head trauma, moderate-severe EXAM: CT HEAD WITHOUT CONTRAST TECHNIQUE: Contiguous axial images were obtained from the base of the skull through the vertex without intravenous contrast. RADIATION DOSE REDUCTION: This exam was performed according to the departmental dose-optimization program which includes automated exposure control, adjustment of the mA and/or kV according to patient size and/or use of iterative  reconstruction technique. COMPARISON:  None Available. FINDINGS: Brain: There appear to be bilateral posterior parietal mass lesions, measuring approximately 2.7 cm on the left with areas of hemorrhage noted, and measuring approximately 1.3 cm on the right. Surrounding vasogenic edema in both parietal lobes. Also noted is a small left subdural hematoma measuring 4-5 mm in thickness. No significant mass effect or midline shift. No hydrocephalus. Vascular: No hyperdense vessel or unexpected calcification. Skull: No acute calvarial abnormality. Sinuses/Orbits: No acute findings Other: None IMPRESSION: Concern for bilateral posterior parietal mass lesions, left larger than right. Associated areas of hemorrhage within and surrounding the left posterior  parietal mass lesion along with 4-5 mm left subdural hematoma. Associated vasogenic edema throughout the parietal lobes bilaterally. Critical Value/emergent results were called by telephone at the time of interpretation on 01/25/2024 at 3:29 pm to provider WHITNEY PLUNKETT , who verbally acknowledged these results. Electronically Signed   By: Franky Crease M.D.   On: 01/25/2024 15:29   DG Chest 2 View Result Date: 01/25/2024 CLINICAL DATA:  Recurrent falls. EXAM: CHEST - 2 VIEW COMPARISON:  11/13/2022. FINDINGS: Bilateral lungs appear hyperexpanded and hyperlucent with coarse bronchovascular markings, in keeping with COPD. Bilateral lungs otherwise appear clear. No dense consolidation or lung collapse. Bilateral costophrenic angles are clear. Normal cardio-mediastinal silhouette. No acute osseous abnormalities. The soft tissues are within normal limits. Right-sided CT Port-A-Cath is seen with its tip overlying the cavoatrial junction region. IMPRESSION: No active cardiopulmonary disease. COPD. Electronically Signed   By: Ree Molt M.D.   On: 01/25/2024 13:22   EKG: Independently reviewed.  Sinus tachycardia at 106 bpm.  Nonspecific T wave changes.  Baseline  wander.  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  Found to have bilateral brain masses likely metastases in setting of lung cancer. > Imaging also noted to have associated areas of hemorrhage with a 4 to 5 mm left subdural hematoma. > Evaluated by neurosurgery in the ED, recommending Decadron  but no current need for neurosurgical intervention. > Discussed with oncology as below who will see the patient and states patient will need radiation oncology involvement. - Monitor on progressive unit overnight, neuro progressive unit is available - Appreciate neurosurgery and oncology recommendations and assistance - Continue with scheduled Decadron  - Repeat CT in the morning, to monitor for progression of hematoma  Lung cancer Suspected brain metastasis > Patient with known history of lung cancer diagnosed a year ago.  Status post chemoradiation now on Imfinzi . > Now found to have suspected PE bilateral brain metastases. > Discussed with oncology in the ED and they plan to involve radiation oncology.  It appears that they will be communicating with radiation oncology, however we will need to ensure that this tomorrow. - Appreciate oncology recommendations and assistance - Monitor of brain mass and hematoma as above - CT chest abdomen pelvis - MRI brain  COPD - Continue home Spiriva  - Continue home Singulair  - Confirm how Trelegy is being used (appears to have been as needed recently)  Diabetes - SSI, will be receiving steroids  DVT prophylaxis: SCDs Code Status:   Full Family Communication:  Updated at  bedside Disposition Plan:   Patient is from:  Home  Anticipated DC to:  Home  Anticipated DC date:  2 to 5 days  Anticipated DC barriers: None  Consults called:  Neurosurgery, medical oncology, medical oncology plan to involve radiation oncology Admission status:  Inpatient, progressive  Severity of Illness: The appropriate patient status for this patient is INPATIENT. Inpatient status is judged to be reasonable and necessary in order to provide the required intensity of service to ensure the patient's safety. The patient's presenting symptoms, physical exam findings, and initial radiographic and laboratory data in the context of their chronic comorbidities is felt to place  them at high risk for further clinical deterioration. Furthermore, it is not anticipated that the patient will be medically stable for discharge from the hospital within 2 midnights of admission.   * I certify that at the point of admission it is my clinical judgment that the patient will require inpatient hospital care spanning beyond 2 midnights from the point of admission due to high intensity of service, high risk for further deterioration and high frequency of surveillance required.DEWAINE Marsa KATHEE Seena MD [facility]  How to contact the [facility] Attending or Consulting provider 7A - 7P or covering provider during after hours 7P -7A, for this patient?   Check the care team in [facility] and look for a) attending/consulting [facility] provider listed and b) the [facility] team listed Log into [email or web address] and use [facility]'s universal password to access. If you do not have the password, please contact the hospital operator. Locate the [facility] provider you are looking for under [facility] and page to a number that you can be directly reached. If you still have difficulty reaching the provider, please page the [facility] (Director on Call) for the Hospitalists listed on amion for assistance.  01/25/2024, 5:20 PM

## 2024-01-25 NOTE — ED Provider Notes (Signed)
 [place] EMERGENCY DEPARTMENT AT [facility] Provider Note   CSN: [id number] Arrival date & time: 01/25/24  1237     Patient presents with: Kristy Salazar is a 83 y.o. female.   Patient is an 83 year old female with a history of hypertension, diabetes, COPD, prior non-small cell lung cancer status post chemo and radiation who is currently in remission who is presenting today due to frequent falls that started a day and a half ago.  She reports that she just falls without any rhyme or reason.  She denies feeling lightheaded or dizzy but reports she will be walking and just falls to the ground.  Most of the time she has fallen backwards but 1 time she fell forward.  She reports that she remembers the entire fall but she has hit her head several times.  She denies tripping or stumbling but just seems to lose her balance and falls backwards even when she is using her cane.  She has some mild pain in her head where she is hit it but denies a headache.  She also has some mild pain in her sternum after the last fall this morning.  She reports this morning when she fell her family was there and got her up immediately but the other times she sometimes had to lay there for a little while but then was able to get up.  She denies any shortness of breath, nausea, vomiting.  No medication changes and reports she really only uses inhalers at this point.  She has been eating and drinking normally.  No vision changes.  No history of similar symptoms in the past.  The history is provided by the patient, the EMS personnel and medical records.  Fall       Prior to Admission medications   Medication Sig Start Date End Date Taking? Authorizing Provider  FARXIGA 10 MG TABS tablet Take 10 mg by mouth daily. 07/27/23   [provider]  fluconazole  (DIFLUCAN ) 100 MG tablet Take 1 tablet (100 mg total) by mouth daily. 11/21/23   Sherrod Sherrod, MD  Fluticasone-Umeclidin-Vilant 100-62.5-25  MCG/INH AEPB Inhale 2 application  into the lungs daily as needed.    [provider]  HYDROcodone -acetaminophen  (NORCO/VICODIN) 5-325 MG tablet Take 1 tablet by mouth every 6 (six) hours as needed for moderate pain (pain score 4-6). Patient not taking: Reported on 01/18/2024 12/22/23   Heilingoetter, Cassandra L, PA-C  megestrol (MEGACE) 40 MG/ML suspension Take 40 mg by mouth daily. Patient not taking: Reported on 01/18/2024 08/31/23   [provider]  montelukast  (SINGULAIR ) 10 MG tablet Take 10 mg by mouth daily. For seasonal allergies    [provider]  Multiple Vitamin (MULTIVITAMIN WITH MINERALS) TABS tablet Take 1 tablet by mouth daily.    [provider]  potassium chloride  20 MEQ/15ML (10%) SOLN Take 15 mLs (20 mEq total) by mouth 2 (two) times daily. Patient not taking: Reported on 11/14/2023 09/27/23   Heilingoetter, Cassandra L, PA-C  prochlorperazine  (COMPAZINE ) 10 MG tablet TAKE 1 TABLET BY MOUTH EVERY 6 HOURS AS NEEDED FOR NAUSEA FOR VOMITING 08/30/23   Sherrod Sherrod, MD  rosuvastatin  (CRESTOR ) 10 MG tablet Take 10 mg by mouth daily.      [provider]  sucralfate  (CARAFATE ) 1 GM/10ML suspension Take 1 g by mouth 4 (four) times daily -  with meals and at bedtime.    [provider]  tiotropium (SPIRIVA ) 18 MCG inhalation capsule Place 18  mcg into inhaler and inhale daily.    [provider]    Allergies: Erythromycin    Review of Systems  Updated Vital Signs BP 122/79   Pulse (!) 107   Temp 97.9 F (36.6 C) (Oral)   Resp (!) 31   Ht 5' 11 (1.803 m)   Wt 44.7 kg   SpO2 93%   BMI 13.74 kg/m   Physical Exam Vitals and nursing note reviewed.  Constitutional:      General: She is not in acute distress.    Appearance: She is well-developed.  HENT:     Head: Normocephalic.   Eyes:     General: No visual field deficit.    Pupils: Pupils are equal, round, and reactive to light.  Cardiovascular:     Rate and  Rhythm: Normal rate and regular rhythm.     Pulses: Normal pulses.     Heart sounds: Normal heart sounds. No murmur heard.    No friction rub.  Pulmonary:     Effort: Pulmonary effort is normal.     Breath sounds: Normal breath sounds. No wheezing or rales.  Chest:     Chest wall: Tenderness present.       Comments: Port noted in the right chest Abdominal:     General: Bowel sounds are normal. There is no distension.     Palpations: Abdomen is soft.     Tenderness: There is no abdominal tenderness. There is no guarding or rebound.  Musculoskeletal:        General: No tenderness. Normal range of motion.     Cervical back: Normal range of motion and neck supple. No tenderness.     Comments: No edema  Skin:    General: Skin is warm and dry.     Findings: No rash.  Neurological:     Mental Status: She is alert and oriented to person, place, and time.     Cranial Nerves: No cranial nerve deficit, dysarthria or facial asymmetry.     Sensory: Sensation is intact.     Motor: No weakness or pronator drift.     Coordination: Finger-Nose-Finger Test and Heel to [employer] normal.  Psychiatric:        Behavior: Behavior normal.     (all labs ordered are listed, but only abnormal results are displayed) Labs Reviewed  CBC WITH DIFFERENTIAL/PLATELET - Abnormal; Notable for the following components:      Result Value   RBC 3.58 (*)    Hemoglobin 11.3 (*)    HCT 34.3 (*)    Abs Immature Granulocytes 0.21 (*)    All other components within normal limits  COMPREHENSIVE METABOLIC PANEL WITH GFR - Abnormal; Notable for the following components:   CO2 19 (*)    Albumin 2.8 (*)    All other components within normal limits  MAGNESIUM   URINALYSIS, W/ REFLEX TO CULTURE (INFECTION SUSPECTED)  TROPONIN I (HIGH SENSITIVITY)  TROPONIN I (HIGH SENSITIVITY)    EKG: EKG Interpretation Date/Time:  Wednesday January 25 2024 12:40:35 EDT Ventricular Rate:  106 PR Interval:  131 QRS  Duration:  74 QT Interval:  350 QTC Calculation: 465 R Axis:   75  Text Interpretation: Sinus tachycardia Probable left atrial enlargement Anteroseptal infarct, age indeterminate No significant change since last tracing Confirmed by Doretha Folks ([id number]) on 01/25/2024 1:02:31 PM  Radiology: CT Cervical Spine Wo Contrast Result Date: 01/25/2024 CLINICAL DATA:  Neck trauma (Age >= 65y) EXAM: CT CERVICAL SPINE WITHOUT CONTRAST TECHNIQUE:  Multidetector CT imaging of the cervical spine was performed without intravenous contrast. Multiplanar CT image reconstructions were also generated. RADIATION DOSE REDUCTION: This exam was performed according to the departmental dose-optimization program which includes automated exposure control, adjustment of the mA and/or kV according to patient size and/or use of iterative reconstruction technique. COMPARISON:  None Available. FINDINGS: Alignment: No subluxation. Slight degenerative anterolisthesis of C3 on C4 and retrolisthesis of C5 on C6. Skull base and vertebrae: No acute fracture. No primary bone lesion or focal pathologic process. Soft tissues and spinal canal: No prevertebral fluid or swelling. No visible canal hematoma. Disc levels: Moderate degenerative disc disease in the lower cervical spine. Advanced bilateral degenerative facet disease diffusely. Multilevel bilateral neural foraminal narrowing. Focal disc herniation centrally at C5-6 causing central spinal stenosis. Upper chest: Biapical scarring.  Emphysema. Other: None IMPRESSION: Degenerative disc and facet disease as described above. Central disc herniation at C5-6 with central spinal stenosis. No acute bony abnormality. Electronically Signed   By: Franky Crease M.D.   On: 01/25/2024 15:33   CT Head Wo Contrast Result Date: 01/25/2024 CLINICAL DATA:  Head trauma, moderate-severe EXAM: CT HEAD WITHOUT CONTRAST TECHNIQUE: Contiguous axial images were obtained from the base of the skull through the vertex  without intravenous contrast. RADIATION DOSE REDUCTION: This exam was performed according to the departmental dose-optimization program which includes automated exposure control, adjustment of the mA and/or kV according to patient size and/or use of iterative reconstruction technique. COMPARISON:  None Available. FINDINGS: Brain: There appear to be bilateral posterior parietal mass lesions, measuring approximately 2.7 cm on the left with areas of hemorrhage noted, and measuring approximately 1.3 cm on the right. Surrounding vasogenic edema in both parietal lobes. Also noted is a small left subdural hematoma measuring 4-5 mm in thickness. No significant mass effect or midline shift. No hydrocephalus. Vascular: No hyperdense vessel or unexpected calcification. Skull: No acute calvarial abnormality. Sinuses/Orbits: No acute findings Other: None IMPRESSION: Concern for bilateral posterior parietal mass lesions, left larger than right. Associated areas of hemorrhage within and surrounding the left posterior parietal mass lesion along with 4-5 mm left subdural hematoma. Associated vasogenic edema throughout the parietal lobes bilaterally. Critical Value/emergent results were called by telephone at the time of interpretation on 01/25/2024 at 3:29 pm to provider Teion Ballin , who verbally acknowledged these results. Electronically Signed   By: Franky Crease M.D.   On: 01/25/2024 15:29   DG Chest 2 View Result Date: 01/25/2024 CLINICAL DATA:  Recurrent falls. EXAM: CHEST - 2 VIEW COMPARISON:  11/13/2022. FINDINGS: Bilateral lungs appear hyperexpanded and hyperlucent with coarse bronchovascular markings, in keeping with COPD. Bilateral lungs otherwise appear clear. No dense consolidation or lung collapse. Bilateral costophrenic angles are clear. Normal cardio-mediastinal silhouette. No acute osseous abnormalities. The soft tissues are within normal limits. Right-sided CT Port-A-Cath is seen with its tip overlying the  cavoatrial junction region. IMPRESSION: No active cardiopulmonary disease. COPD. Electronically Signed   By: Ree Molt M.D.   On: 01/25/2024 13:22     Procedures   Medications Ordered in the ED  acetaminophen  (TYLENOL ) tablet 1,000 mg (has no administration in time range)                                    Medical Decision Making Amount and/or Complexity of Data Reviewed Independent Historian: EMS External Data Reviewed: notes. Labs: ordered. Decision-making details documented in ED Course. Radiology:  ordered and independent interpretation performed. Decision-making details documented in ED Course. ECG/medicine tests: ordered and independent interpretation performed. Decision-making details documented in ED Course.  Risk OTC drugs.   Pt with multiple medical problems and comorbidities and presenting today with a complaint that caries a high risk for morbidity and mortality.  Here today with recurrent falls.  Patient is denying syncope unclear if she is just losing her balance and falling.  Concern for brain metastases, intracranial hemorrhage, stroke.  Lower suspicion for dysrhythmia, AKI or medication cause for her symptoms.  Patient is currently awake and alert and at baseline.  Reports these episodes only happen when she is getting up and trying to walk around.  3:41 PM I dependently interpreted patient's labs and EKG.  EKG without acute findings today.  CBC, CMP, troponin, magnesium  all without acute findings.  I have independently visualized and interpreted pt's images today. Head CT with evidence of brain lesions with some bleeding within one of the masses.  Patient also has a subdural hemorrhage which is 4 to 5 mm with no shift.  Cervical spine is negative.  Radiology reports generalized disc disease but no acute findings on cervical spine, chest x-ray without acute findings, head CT with bilateral posterior parietal mass lesions left larger than right with associated  hemorrhage within and surrounding as well as associated vasogenic edema.  Discussed the findings with the patient and her daughter.      Final diagnoses:  None    ED Discharge Orders     None          Doretha Folks, MD 01/25/24 [phone]

## 2024-01-25 NOTE — ED Provider Notes (Signed)
  Physical Exam  BP 133/88   Pulse (!) 106   Temp 97.9 F (36.6 C) (Oral)   Resp 20   Ht 5' 11 (1.803 m)   Wt 44.7 kg   SpO2 92%   BMI 13.74 kg/m   Physical Exam  Procedures  Procedures  ED Course / MDM    Medical Decision Making Amount and/or Complexity of Data Reviewed Labs: ordered. Radiology: ordered.  Risk OTC drugs. Prescription drug management.   I, Larnell Gravely, assumed care for this patient.  In brief 83 year old female with history of squamous cell carcinoma of the lung here today for falls.  At signout, patient CT imaging showed parietal mass, small subdural, vasogenic edema.  Dr. Gillie, neurosurgery, happened to be in the ED, did not recommend any intervention regarding subdural.  Spoke with patient's primary oncologist Dr. Gatha who recommended steroids, admission with inpatient consultation for radiation oncology.       Gravely Pac T, DO 01/25/24 [phone]

## 2024-01-25 NOTE — ED Triage Notes (Signed)
 Pt from home via [facility] with reports of weakness and multiple falls. Pt reports she remembers when she falls. PT reports headache, denies neck pain.

## 2024-01-26 ENCOUNTER — Ambulatory Visit
Admit: 2024-01-26 | Discharge: 2024-01-26 | Disposition: A | Discharge status: Home / Self Care | Admitting: Radiation Oncology

## 2024-01-26 ENCOUNTER — Inpatient Hospital Stay (HOSPITAL_COMMUNITY)

## 2024-01-26 ENCOUNTER — Other Ambulatory Visit: Payer: Self-pay | Admitting: Physician Assistant

## 2024-01-26 ENCOUNTER — Telehealth: Payer: Self-pay | Admitting: Internal Medicine

## 2024-01-26 DIAGNOSIS — C3431 Malignant neoplasm of lower lobe, right bronchus or lung: Secondary | ICD-10-CM | POA: Insufficient documentation

## 2024-01-26 DIAGNOSIS — C7931 Secondary malignant neoplasm of brain: Secondary | ICD-10-CM

## 2024-01-26 DIAGNOSIS — C3490 Malignant neoplasm of unspecified part of unspecified bronchus or lung: Secondary | ICD-10-CM | POA: Diagnosis not present

## 2024-01-26 DIAGNOSIS — Z510 Encounter for antineoplastic radiation therapy: Secondary | ICD-10-CM | POA: Insufficient documentation

## 2024-01-26 LAB — COMPREHENSIVE METABOLIC PANEL WITH GFR
ALT: 31 U/L (ref 0–44)
AST: 32 U/L (ref 15–41)
Albumin: 2.8 g/dL — ABNORMAL LOW (ref 3.5–5.0)
Alkaline Phosphatase: 107 U/L (ref 38–126)
Anion gap: 15 (ref 5–15)
BUN: 15 mg/dL (ref 8–23)
CO2: 15 mmol/L — ABNORMAL LOW (ref 22–32)
Calcium: 9.2 mg/dL (ref 8.9–10.3)
Chloride: 107 mmol/L (ref 98–111)
Creatinine, Ser: 0.7 mg/dL (ref 0.44–1.00)
GFR, Estimated: >60 mL/min (ref >60)
Glucose, Bld: 138 mg/dL — ABNORMAL HIGH (ref 70–99)
Potassium: 4.5 mmol/L (ref 3.5–5.1)
Sodium: 137 mmol/L (ref 135–145)
Total Bilirubin: 0.7 mg/dL (ref 0.0–1.2)
Total Protein: 7.8 g/dL (ref 6.5–8.1)

## 2024-01-26 LAB — CBC
HCT: 35.5 % — ABNORMAL LOW (ref 36.0–46.0)
Hemoglobin: 11.7 g/dL — ABNORMAL LOW (ref 12.0–15.0)
MCH: 31.5 pg (ref 26.0–34.0)
MCHC: 33 g/dL (ref 30.0–36.0)
MCV: 95.7 fL (ref 80.0–100.0)
Platelets: 371 K/uL (ref 150–400)
RBC: 3.71 MIL/uL — ABNORMAL LOW (ref 3.87–5.11)
RDW: 15.6 % — ABNORMAL HIGH (ref 11.5–15.5)
WBC: 10 K/uL (ref 4.0–10.5)
nRBC: 0 % (ref 0.0–0.2)

## 2024-01-26 LAB — GLUCOSE, CAPILLARY
Glucose-Capillary: 160 mg/dL — ABNORMAL HIGH (ref 70–99)
Glucose-Capillary: 190 mg/dL — ABNORMAL HIGH (ref 70–99)

## 2024-01-26 LAB — CBG MONITORING, ED
Glucose-Capillary: 145 mg/dL — ABNORMAL HIGH (ref 70–99)
Glucose-Capillary: 154 mg/dL — ABNORMAL HIGH (ref 70–99)
Glucose-Capillary: 176 mg/dL — ABNORMAL HIGH (ref 70–99)

## 2024-01-26 MED ORDER — DEXAMETHASONE SODIUM PHOSPHATE 10 MG/ML IJ SOLN
6.0000 mg | Freq: Four times a day (QID) | INTRAMUSCULAR | Status: DC
  Administered 2024-01-26 (×2): 6 mg via INTRAVENOUS
  Filled 2024-01-26 (×2): qty 1

## 2024-01-26 MED ORDER — DEXAMETHASONE SODIUM PHOSPHATE 4 MG/ML IJ SOLN
4.0000 mg | Freq: Four times a day (QID) | INTRAMUSCULAR | Status: DC
  Administered 2024-01-26 – 2024-01-30 (×14): 4 mg via INTRAVENOUS
  Filled 2024-01-26 (×14): qty 1

## 2024-01-26 NOTE — ED Notes (Signed)
 Pt given coffee per request. No other needs voiced at this time. Call light within reach and pt on monitor

## 2024-01-26 NOTE — Progress Notes (Signed)
 Patient ID: Kristy Salazar, female   DOB: 1940-09-15, 83 y.o.   MRN: [id number] BP 122/69 (BP Location: Left Arm)   Pulse 92   Temp 97.6 F (36.4 C) (Oral)   Resp 20   Ht 5' 11 (1.803 m)   Wt 44.7 kg   SpO2 94%   BMI 13.74 kg/m ' MRI reviewed. Two parietal lesions, significant edema. Will reduce decadron . Tumor board discussion on Monday. Will relay findings

## 2024-01-26 NOTE — Progress Notes (Signed)
 Progress Note   Patient: Kristy Salazar FMW:[id number] DOB: 1940-07-25 DOA: 01/25/2024     1 DOS: the patient was seen and examined on 01/26/2024   Brief hospital course: 83 y.o. female with medical history significant of diabetes, lung cancer, COPD, radiation esophagitis presenting after falls at home.  Patient found to have metastases to the brain and a subdural hematoma from the fall. CT head repeated, SDH stable. Patient was seen by neurosurgery and has been started on high-dose steroids.  Oncology consulted.    Assessment and Plan:  Brain metastases Subdural hematoma Vasogenic Edema Falls > Patient presenting after multiple falls at home.  Found to have bilateral brain masses likely metastases in setting of lung cancer. > Imaging also noted to have associated areas of hemorrhage with a 4 to 5 mm left subdural hematoma. > Evaluated by neurosurgery in the ED, recommending Decadron  but no current need for neurosurgical intervention. > Discussed with oncology as below who will see the patient and states patient will need radiation oncology involvement. > CT repeated 7/17 and shows stability of SDH.  - Monitor on progressive unit overnight, neuro progressive unit is available - Appreciate neurosurgery and oncology recommendations and assistance - Continue with scheduled Decadron  6mg  q6h    Lung cancer with brain metastasis > Patient with known history of lung cancer diagnosed a year ago.  Status post chemoradiation now on Imfinzi . > Now found to have suspected bilateral brain metastases. > Discussed with oncology in the ED and they plan to involve radiation oncology.   > CT chest, abdomen, pelvis negative for metastatic disease within the abdomen or pelvis.  Patient does have metastatic lung nodules. - Appreciate oncology recommendations and assistance - Monitor of brain mass and hematoma as above   COPD - Continue home Spiriva  - Continue home Singulair  - Confirm how Trelegy is  being used (appears to have been as needed recently)   Diabetes - SSI, will be receiving steroids  Subjective: Patient states she is feeling well.  She denies headache.  Physical Exam: Vitals:   01/26/24 0615 01/26/24 0630 01/26/24 0930 01/26/24 1018  BP: 119/80 114/83 109/65   Pulse: (!) 108 (!) 102 (!) 113   Resp:  (!) 23 (!) 36   Temp:    (!) 97.4 F (36.3 C)  TempSrc:    Oral  SpO2: 99% 99% 100%   Weight:      Height:       Physical Exam   General: Alert, oriented X3  Eyes: Pupils equal, reactive  Oral cavity: moist mucous membranes  Head: Atraumatic, normocephalic  Neck: supple  Chest: clear to auscultation. No crackles, no wheezes  CVS: S1,S2 RRR. No murmurs  Abd: No distention, soft, non-tender. No masses palpable  Extr: No edema   MSK: No joint deformities or swelling  Neurological: Grossly intact.    Data Reviewed:     Latest Ref Rng & Units 01/25/2024    2:03 PM 01/18/2024    9:09 AM 12/20/2023    9:56 AM  CBC  WBC 4.0 - 10.5 K/uL 8.7  7.4  12.0   Hemoglobin 12.0 - 15.0 g/dL 88.6  87.7  88.4   Hematocrit 36.0 - 46.0 % 34.3  36.1  34.7   Platelets 150 - 400 K/uL 309  359  583       Latest Ref Rng & Units 01/26/2024    5:30 AM 01/25/2024    2:03 PM 01/18/2024    9:09 AM  BMP  Glucose 70 - 99 mg/dL 861  89  79   BUN 8 - 23 mg/dL 15  13  15    Creatinine 0.44 - 1.00 mg/dL 9.29  9.41  9.42   Sodium 135 - 145 mmol/L 137  136  138   Potassium 3.5 - 5.1 mmol/L 4.5  3.6  4.0   Chloride 98 - 111 mmol/L 107  105  106   CO2 22 - 32 mmol/L 15  19  24    Calcium  8.9 - 10.3 mg/dL 9.2  9.3  9.6      Family Communication: Spoke with daughter at bedside.  Disposition: Status is: Inpatient   Planned Discharge Destination: Home DVT prophylaxis: SCDs     Time spent: 35 minutes  Author: MDALA-GAUSI, Drystan Reader AGATHA, MD 01/26/2024 2:34 PM  For on call review [email or web address].

## 2024-01-26 NOTE — Consult Note (Signed)
 Radiation Oncology         [phone] ________________________________  Name: Kristy Salazar        MRN: [id number]  Date of Service: 01/26/24 DOB: 05-26-1941  RR:Aojwi, Kennieth, MD  No ref. provider found     REFERRING PHYSICIAN: Dr. Davia   DIAGNOSIS: Brain metastases with history of lung cancer.   HISTORY OF PRESENT ILLNESS: Kristy Salazar is a 83 y.o. female seen at the request of Dr. Davia for newly diagnosed brain metastases.  The patient is known to our clinic and has been treated in the past with Dr. Dewey for Stage IIIB, [phone], NSCLC, squamous cell carcinoma of the right lower lobe.  She was diagnosed i in late 2024 with her disease, and received chemoradiation which she completed on 07/26/2023.  She had been receiving consolidative immune therapy which began on 08/30/23.  She had been hospitalized in May 2025 with dysphagia and underwent an EGD with esophageal dilation, she most recently was seen in the outpatient setting on 01/18/2024 and lieu of her immunotherapy and had been tolerating well she received cycle #6 that day as well.  She presented however yesterday to the emergency department with weakness and multiple falls in addition to headache.  A CT scan in the emergency department showed 2 cerebral lesions with surrounding edema and acute small left subdural hematoma, she was evaluated by Dr. Gillie and neurosurgery and it was felt that the subdural hematoma was quite small and would not require intervention surgically.  An MRI yesterday showed further concerns for abnormalities within the brain including a 1.6 cm peripheral enhancing lesion in the parietal lobe the 1 on the right measuring 1.6 cm, and the 1 on the left measuring 3.5 cm.  There was associated extensive vasogenic edema and persistent small subdural hematoma over the left lateral cerebral convexity measured 5 mm in thickness and was unchanged from the CT scan done in the emergency room.  A CT scan of the chest abdomen and  pelvis in comparison to her most recent on 11/11/2023 showed evidence of a right lower lobe pulmonary mass measuring 3 x 2.6 cm which was stable to slightly diminished compared to prior measurements with no evidence of new metastatic disease in the abdomen or pelvis.  She has been started on dexamethasone . Given the findings we have been asked to consider radiotherapy options.    PREVIOUS RADIATION THERAPY: Yes   06/06/23-07/26/23:  Plan Name: Lung_R Site: Lung, Right Technique: 3D Mode: Photon Dose Per Fraction: 2 Gy Prescribed Dose (Delivered / Prescribed): 60 Gy / 60 Gy Prescribed Fxs (Delivered / Prescribed): 30 / 30   Plan Name: Lung_R_Bst Site: Lung, Right Technique: 3D Mode: Photon Dose Per Fraction: 2 Gy Prescribed Dose (Delivered / Prescribed): 6 Gy / 6 Gy Prescribed Fxs (Delivered / Prescribed): 3 / 3   PAST MEDICAL HISTORY:  Past Medical History:  Diagnosis Date   Arthritis    Aspiration pneumonia (HCC) 09/09/2023   Asthma    COPD (chronic obstructive pulmonary disease) (HCC)    Diabetes mellitus    patient denies   Hypertension    Non-small cell lung cancer (HCC)    Radiation esophagitis    Seasonal allergies        PAST SURGICAL HISTORY: Past Surgical History:  Procedure Laterality Date   ABDOMINAL HYSTERECTOMY     APPENDECTOMY     BACK SURGERY     BRONCHIAL BIOPSY  05/17/2023   Procedure: BRONCHIAL BIOPSIES;  Surgeon: Shelah Charleston  S, MD;  Location: [facility] ENDOSCOPY;  Service: Pulmonary;;   BRONCHIAL BRUSHINGS  05/17/2023   Procedure: BRONCHIAL BRUSHINGS;  Surgeon: Shelah Lamar RAMAN, MD;  Location: [facility] ENDOSCOPY;  Service: Pulmonary;;   BRONCHIAL NEEDLE ASPIRATION BIOPSY  05/17/2023   Procedure: BRONCHIAL NEEDLE ASPIRATION BIOPSIES;  Surgeon: Shelah Lamar RAMAN, MD;  Location: [facility] ENDOSCOPY;  Service: Pulmonary;;   ESOPHAGOGASTRODUODENOSCOPY N/A 11/14/2023   Procedure: EGD (ESOPHAGOGASTRODUODENOSCOPY);  Surgeon: Rosalie Kitchens, MD;  Location: [facility];  Service:  Gastroenterology;  Laterality: N/A;   ESOPHAGOGASTRODUODENOSCOPY (EGD) WITH PROPOFOL  N/A 09/13/2023   Procedure: ESOPHAGOGASTRODUODENOSCOPY (EGD) WITH PROPOFOL ;  Surgeon: Kriss Estefana DEL, DO;  Location: [facility] ENDOSCOPY;  Service: Gastroenterology;  Laterality: N/A;   IR IMAGING GUIDED PORT INSERTION  06/03/2023   JOINT REPLACEMENT     TOTAL HIP ARTHROPLASTY     TOTAL KNEE ARTHROPLASTY     VIDEO BRONCHOSCOPY WITH ENDOBRONCHIAL ULTRASOUND Right 05/17/2023   Procedure: VIDEO BRONCHOSCOPY WITH ENDOBRONCHIAL ULTRASOUND;  Surgeon: Shelah Lamar RAMAN, MD;  Location: [facility] ENDOSCOPY;  Service: Pulmonary;  Laterality: Right;     FAMILY HISTORY: History reviewed. No pertinent family history.   SOCIAL HISTORY:  reports that she has been smoking cigarettes. She has a 12.5 pack-year smoking history. She has never used smokeless tobacco. She reports that she does not drink alcohol and does not use drugs. The patient is with her daughter Niala Stcharles, she has adult sons 2 of which live out of town and 1 lives in [place].  She has been working leading up to her initial diagnosis and had been quite active prior to her cancer.  ALLERGIES: Erythromycin   MEDICATIONS:  Current Facility-Administered Medications  Medication Dose Route Frequency Provider Last Rate Last Admin   acetaminophen  (TYLENOL ) tablet 650 mg  650 mg Oral Q6H PRN Seena Marsa NOVAK, MD       Or   acetaminophen  (TYLENOL ) suppository 650 mg  650 mg Rectal Q6H PRN Seena Marsa NOVAK, MD       [START ON 01/27/2024] dexamethasone  (DECADRON ) injection 4 mg  4 mg Intravenous Q6H Gillie Duncans, MD       guaiFENesin  (ROBITUSSIN) 100 MG/5ML liquid 5 mL  5 mL Oral Q4H PRN Seena Marsa NOVAK, MD   5 mL at 01/25/24 1858   insulin  aspart (novoLOG ) injection 0-9 Units  0-9 Units Subcutaneous TID WC Melvin, Alexander B, MD   2 Units at 01/26/24 1755   montelukast  (SINGULAIR ) tablet 10 mg  10 mg Oral Daily Melvin, Alexander B, MD   10 mg at 01/26/24  0918   polyethylene glycol (MIRALAX  / GLYCOLAX ) packet 17 g  17 g Oral Daily PRN Melvin, Alexander B, MD       rosuvastatin  (CRESTOR ) tablet 10 mg  10 mg Oral Daily Melvin, Alexander B, MD   10 mg at 01/26/24 9081   sodium chloride  flush (NS) 0.9 % injection 3 mL  3 mL Intravenous Q12H Seena Marsa NOVAK, MD   3 mL at 01/26/24 2232   umeclidinium bromide  (INCRUSE ELLIPTA ) 62.5 MCG/ACT 1 puff  1 puff Inhalation Daily Seena Marsa NOVAK, MD   1 puff at 01/26/24 1251     REVIEW OF SYSTEMS: On review of systems, patient's daughter states that her mother is doing okay and just got to a hospital bed, she states that they have been quite tired over the last 24 hours as she has been in the ER awaiting placement.  She states that her mother has fallen 2-3 times recently and has had  complaints of headache.  No other complaints are specifically verbalized.    PHYSICAL EXAM:  Wt Readings from Last 3 Encounters:  01/25/24 98 lb 8 oz (44.7 kg)  01/18/24 98 lb 8 oz (44.7 kg)  12/20/23 87 lb 12.8 oz (39.8 kg)   Temp Readings from Last 3 Encounters:  01/26/24 97.6 F (36.4 C) (Oral)  01/18/24 98.1 F (36.7 C) (Oral)  12/20/23 97.9 F (36.6 C) (Temporal)   BP Readings from Last 3 Encounters:  01/26/24 111/73  01/18/24 108/63  12/20/23 109/84   Pulse Readings from Last 3 Encounters:  01/26/24 92  01/18/24 85  12/20/23 76   Pain Assessment Pain Score: Asleep/10 We are unable to assess the patient given the encounter type   ECOG = 2  0 - Asymptomatic (Fully active, able to carry on all predisease activities without restriction)  1 - Symptomatic but completely ambulatory (Restricted in physically strenuous activity but ambulatory and able to carry out work of a light or sedentary nature. For example, light housework, office work)  2 - Symptomatic, <50% in bed during the day (Ambulatory and capable of all self care but unable to carry out any work activities. Up and about more than 50% of  waking hours)  3 - Symptomatic, >50% in bed, but not bedbound (Capable of only limited self-care, confined to bed or chair 50% or more of waking hours)  4 - Bedbound (Completely disabled. Cannot carry on any self-care. Totally confined to bed or chair)  5 - Death   Raylene MM, Creech RH, Tormey DC, et al. [phone]). Toxicity and response criteria of the [facility]. Am. DOROTHA Bridges. Oncol. 5 (6): 649-55    LABORATORY DATA:  Lab Results  Component Value Date   WBC 10.0 01/26/2024   HGB 11.7 (L) 01/26/2024   HCT 35.5 (L) 01/26/2024   MCV 95.7 01/26/2024   PLT 371 01/26/2024   Lab Results  Component Value Date   NA 137 01/26/2024   K 4.5 01/26/2024   CL 107 01/26/2024   CO2 15 (L) 01/26/2024   Lab Results  Component Value Date   ALT 31 01/26/2024   AST 32 01/26/2024   ALKPHOS 107 01/26/2024   BILITOT 0.7 01/26/2024      RADIOGRAPHY: CT HEAD WO CONTRAST ( ) Result Date: 01/26/2024 CLINICAL DATA:  83 year old female with abnormal head CT and brain MRI yesterday, multiple recent falls. Brain metastases suspected on MRI, with small left hemisphere subdural hematoma also. EXAM: CT HEAD WITHOUT CONTRAST TECHNIQUE: Contiguous axial images were obtained from the base of the skull through the vertex without intravenous contrast. RADIATION DOSE REDUCTION: This exam was performed according to the departmental dose-optimization program which includes automated exposure control, adjustment of the mA and/or kV according to patient size and/or use of iterative reconstruction technique. COMPARISON:  Brain MRI and head CT yesterday. FINDINGS: Brain: Mixed density but mostly hyperdense left side subdural hematoma is 4 mm on series 2, image 16 and appears stable. No new intracranial hemorrhage is identified. But there are biparietal mass lesions redemonstrated, with mild hyperdense blood products associated with the larger left side lesion as before. That mass was up to 3.5 cm by  MRI. Regional vasogenic edema in the posterior hemispheres appears stable from yesterday. Stable associated relatively mild intracranial mass effect, mass effect on the atria of the lateral ventricle and trace rightward midline shift. No ventriculomegaly. Basilar cisterns remain patent. No new cerebral edema identified. Vascular: Calcified atherosclerosis at the skull  base. No suspicious intracranial vascular hyperdensity. Skull: No fracture identified. Sinuses/Orbits: Visualized paranasal sinuses and mastoids are stable and well aerated. Other: Mild left posterosuperior scalp hematoma or contusion suspected on series 3, image 61. Underlying calvarium intact. No scalp soft tissue gas. Visualized orbit soft tissues are within normal limits. IMPRESSION: 1. Stable since yesterday: - 4 mm Left Side Subdural Hematoma. Overlying left scalp hematoma but no skull fracture identified. - heterogeneous brain metastases in both parietal lobes, larger on the left. Regional cytotoxic edema. 2. Stable mild intracranial mass effect, trace rightward midline shift. 3. No new intracranial abnormality. Electronically Signed   By: VEAR Hurst M.D.   On: 01/26/2024 07:16   CT CHEST ABDOMEN PELVIS W CONTRAST Result Date: 01/25/2024 CLINICAL DATA:  Lung cancer frequent fall EXAM: CT CHEST, ABDOMEN, AND PELVIS WITH CONTRAST TECHNIQUE: Multidetector CT imaging of the chest, abdomen and pelvis was performed following the standard protocol during bolus administration of intravenous contrast. RADIATION DOSE REDUCTION: This exam was performed according to the departmental dose-optimization program which includes automated exposure control, adjustment of the mA and/or kV according to patient size and/or use of iterative reconstruction technique. CONTRAST:  40mL OMNIPAQUE  IOHEXOL  350 MG/ML SOLN COMPARISON:  CT 11/11/2023, 09/09/2023, PET CT 05/19/2023, chest CT 08/15/2023, 07/04/2023 FINDINGS: CT CHEST FINDINGS Cardiovascular: Moderate aortic  atherosclerosis. No aneurysm. Normal cardiac size. Coronary vascular calcification. No pericardial effusion. Right-sided central venous port tip at the distal SVC. Mediastinum/Nodes: Patent trachea. No thyroid  mass. Pretracheal lymph node on series 3, image 25 measures 8 mm, previously 8 mm. Similar subcentimeter precarinal lymph node. Esophagus within normal limits. Lungs/Pleura: Advanced emphysema. Spiculated right lower lobe pulmonary mass measures about 3 x 2.6 cm on series 4, image 133, previously 33 x 28 mm. Mucoid impaction within slightly dilated right lower lobe bronchi proximal to the mass. Previously noted medial right lower lobe subpleural focus of consolidation is largely resolved, however interval multifocal bilateral heterogeneous consolidations within the bilateral upper lobes, subpleural right middle lobe and the right lower lobe. Right lower lobe lung mass is contiguous with consolidative process posteriorly that extends to the pleural surface. Left lower lobe subpleural nodularity measuring about 6 mm on series 4, image 126, previously 6 mm. Subpleural left upper lobe nodularity measures about 14 x 7 mm on series 4, image 43, previously 13 x 7 mm. Right apical scarring. No pleural effusion or pneumothorax. New left upper lobe 7 mm ground-glass nodule, series 4, image 103. Possible small spiculated 6 mm focus series 4 image 103. Musculoskeletal: No acute or suspicious osseous abnormality. CT ABDOMEN PELVIS FINDINGS Hepatobiliary: Gallstone. No focal hepatic abnormality. No biliary dilatation. Pancreas: Unremarkable. No pancreatic ductal dilatation or surrounding inflammatory changes. Spleen: Normal in size without focal abnormality. Adrenals/Urinary Tract: Adrenal glands are within normal limits. Kidneys show no hydronephrosis. Limited assessment of the collecting systems due to excreted contrast. The bladder is unremarkable aside from dilute contrast. Stomach/Bowel: Stomach nonenlarged. No dilated  small bowel. No acute bowel wall thickening. Vascular/Lymphatic: Advanced calcific aortoiliac disease. No aneurysm. No suspicious lymph nodes. Reproductive: Hysterectomy.  No adnexal mass Other: Negative for pelvic effusion or free air Musculoskeletal: Left hip replacement. AVN of the right femoral head without collapse. No acute osseous abnormality IMPRESSION: 1. Advanced emphysema. Spiculated right lower lobe pulmonary mass measures about 3 x 2.6 cm, stable to slightly diminished, previously 33 x 28 mm. Previously noted medial right lower lobe subpleural focus of consolidation is largely resolved, however interval multifocal bilateral heterogeneous consolidations within the bilateral  upper lobes, subpleural right middle lobe and the right lower lobe. Few new ground-glass pulmonary nodules, most evident in the left upper lobe. Findings are favored to be infectious or inflammatory in etiology but follow-up chest CT recommended as metastatic nodules could potentially be obscured by consolidative airspace disease and for follow-up of the new small left upper lobe pulmonary nodules which are indeterminate in origin. 2. No CT evidence for metastatic disease within the abdomen or pelvis. 3. Gallstone. Aortic Atherosclerosis (ICD10-I70.0) and Emphysema (ICD10-J43.9). Electronically Signed   By: Luke Bun M.D.   On: 01/25/2024 20:02   MR Brain W and Wo Contrast Result Date: 01/25/2024 CLINICAL DATA:  Brain/CNS neoplasm, assess treatment response. Multiple recent falls. EXAM: MRI HEAD WITHOUT AND WITH CONTRAST TECHNIQUE: Multiplanar, multiecho pulse sequences of the brain and surrounding structures were obtained without and with intravenous contrast. CONTRAST:  5mL GADAVIST  GADOBUTROL  1 MMOL/ML IV SOLN COMPARISON:  Head CT 01/25/2024 and MRI 05/13/2023 FINDINGS: Brain: As seen on today's earlier head CT, there are bilateral parietal masses with the left being hemorrhagic. Both lesions demonstrate irregular peripheral  enhancement and measure 1.6 x 1.5 cm on the right and 3.5 x 2.7 cm on the left. There is associated moderately extensive vasogenic edema in the left greater than right posterior cerebral hemispheres with mild mass effect on the left lateral ventricle. A small subdural hematoma over the left lateral cerebral convexity measuring up to 5 mm in thickness is unchanged from today's CT. There is no significant midline shift. Small T2 hyperintensities elsewhere in the cerebral white matter and pons are nonspecific but compatible with mild chronic small vessel ischemic disease with changes in the pons having progressed from the prior MRI. No acute infarct or hydrocephalus is evident. There is mild cerebral atrophy. Vascular: Major intracranial vascular flow voids are preserved. Skull and upper cervical spine: No suspicious marrow lesion. Sinuses/Orbits: Bilateral cataract extraction. Paranasal sinuses and mastoid air cells are clear. Other: Left-sided scalp soft tissue swelling. IMPRESSION: 1. Bilateral parietal masses most concerning for metastatic disease. Moderately extensive vasogenic edema without midline shift. 2. Unchanged small left cerebral convexity subdural hematoma. 3. Mild chronic small vessel ischemic disease. Electronically Signed   By: Dasie Hamburg M.D.   On: 01/25/2024 18:53   CT Cervical Spine Wo Contrast Result Date: 01/25/2024 CLINICAL DATA:  Neck trauma (Age >= 65y) EXAM: CT CERVICAL SPINE WITHOUT CONTRAST TECHNIQUE: Multidetector CT imaging of the cervical spine was performed without intravenous contrast. Multiplanar CT image reconstructions were also generated. RADIATION DOSE REDUCTION: This exam was performed according to the departmental dose-optimization program which includes automated exposure control, adjustment of the mA and/or kV according to patient size and/or use of iterative reconstruction technique. COMPARISON:  None Available. FINDINGS: Alignment: No subluxation. Slight degenerative  anterolisthesis of C3 on C4 and retrolisthesis of C5 on C6. Skull base and vertebrae: No acute fracture. No primary bone lesion or focal pathologic process. Soft tissues and spinal canal: No prevertebral fluid or swelling. No visible canal hematoma. Disc levels: Moderate degenerative disc disease in the lower cervical spine. Advanced bilateral degenerative facet disease diffusely. Multilevel bilateral neural foraminal narrowing. Focal disc herniation centrally at C5-6 causing central spinal stenosis. Upper chest: Biapical scarring.  Emphysema. Other: None IMPRESSION: Degenerative disc and facet disease as described above. Central disc herniation at C5-6 with central spinal stenosis. No acute bony abnormality. Electronically Signed   By: Franky Crease M.D.   On: 01/25/2024 15:33   CT Head Wo Contrast Result Date: 01/25/2024  CLINICAL DATA:  Head trauma, moderate-severe EXAM: CT HEAD WITHOUT CONTRAST TECHNIQUE: Contiguous axial images were obtained from the base of the skull through the vertex without intravenous contrast. RADIATION DOSE REDUCTION: This exam was performed according to the departmental dose-optimization program which includes automated exposure control, adjustment of the mA and/or kV according to patient size and/or use of iterative reconstruction technique. COMPARISON:  None Available. FINDINGS: Brain: There appear to be bilateral posterior parietal mass lesions, measuring approximately 2.7 cm on the left with areas of hemorrhage noted, and measuring approximately 1.3 cm on the right. Surrounding vasogenic edema in both parietal lobes. Also noted is a small left subdural hematoma measuring 4-5 mm in thickness. No significant mass effect or midline shift. No hydrocephalus. Vascular: No hyperdense vessel or unexpected calcification. Skull: No acute calvarial abnormality. Sinuses/Orbits: No acute findings Other: None IMPRESSION: Concern for bilateral posterior parietal mass lesions, left larger than  right. Associated areas of hemorrhage within and surrounding the left posterior parietal mass lesion along with 4-5 mm left subdural hematoma. Associated vasogenic edema throughout the parietal lobes bilaterally. Critical Value/emergent results were called by telephone at the time of interpretation on 01/25/2024 at 3:29 pm to provider WHITNEY PLUNKETT , who verbally acknowledged these results. Electronically Signed   By: Franky Crease M.D.   On: 01/25/2024 15:29   DG Chest 2 View Result Date: 01/25/2024 CLINICAL DATA:  Recurrent falls. EXAM: CHEST - 2 VIEW COMPARISON:  11/13/2022. FINDINGS: Bilateral lungs appear hyperexpanded and hyperlucent with coarse bronchovascular markings, in keeping with COPD. Bilateral lungs otherwise appear clear. No dense consolidation or lung collapse. Bilateral costophrenic angles are clear. Normal cardio-mediastinal silhouette. No acute osseous abnormalities. The soft tissues are within normal limits. Right-sided CT Port-A-Cath is seen with its tip overlying the cavoatrial junction region. IMPRESSION: No active cardiopulmonary disease. COPD. Electronically Signed   By: Ree Molt M.D.   On: 01/25/2024 13:22       IMPRESSION/PLAN: 1. Progressive metastatic stage IIIB, cT4N2M0, NSCLC, squamous cell carcinoma of the right lower lobe now involving bilateral parietal lobes.  I spoke with the patient's daughter Ms. [name] Boghosian-Long who is the patient's surrogate healthcare advocate.  We discussed the findings from the patient's recent imaging, and outlined the rationale for our involvement.  Dr. Dewey has recommended that the patient undergo additional imaging as her MRI today is a screening study but would recommend a 3T MRI as she appears to be a good candidate for stereotactic radiosurgery.  We will ask for the scan to be done while she is hospitalized and present her case on Monday and bring oncology conference.  She appears to be a candidate for 3 fractions of radiosurgery,  and we will also ask for Dr. Shelagh input regarding whether or not she would be a candidate for preoperative SRS.  In the meantime she will continue dexamethasone  as recommended by Dr. Gillie and we will continue this as an outpatient.  We will taper her medication as well since she has completed radiosurgery.  We discussed the risks, benefits, short and long-term effects of radiotherapy and the patient's daughter believes her mother would be interested in proceeding as she has otherwise had excellent performance status even in the midst of treatment.  We discussed the locally definitive intent however the patient's daughter is also aware that globally there intention is for palliation.  She will also meet back with her medical oncology team in the outpatient setting to determine whether she will continue her current immunotherapy versus  alternative systemic treatment.   In a visit lasting 60 minutes, greater than 50% of the time was spent by phone and and floor time discussing the patient's condition, in preparation for the discussion, and coordinating the patient's care.     Donald KYM Husband, [facility]   **Disclaimer: This note was dictated with voice recognition software. Similar sounding words can inadvertently be transcribed and this note may contain transcription errors which may not have been corrected upon publication of note.**

## 2024-01-26 NOTE — Telephone Encounter (Signed)
 Rescheduled appointments with the patients granddaughter per providers request.

## 2024-01-26 NOTE — ED Notes (Signed)
 Pt coffee heated up and visitor provided with a cup of coffee. No other needs voiced at this time. Call light within reach and pt on monitor

## 2024-01-26 NOTE — ED Notes (Signed)
Pt taken to CT via stretcher by CT staff.

## 2024-01-27 ENCOUNTER — Ambulatory Visit: Admit: 2024-01-27 | Admitting: Radiation Oncology

## 2024-01-27 ENCOUNTER — Inpatient Hospital Stay (HOSPITAL_COMMUNITY)

## 2024-01-27 ENCOUNTER — Ambulatory Visit: Admitting: Radiation Oncology

## 2024-01-27 DIAGNOSIS — E119 Type 2 diabetes mellitus without complications: Secondary | ICD-10-CM | POA: Diagnosis not present

## 2024-01-27 DIAGNOSIS — C3490 Malignant neoplasm of unspecified part of unspecified bronchus or lung: Secondary | ICD-10-CM | POA: Diagnosis not present

## 2024-01-27 DIAGNOSIS — J449 Chronic obstructive pulmonary disease, unspecified: Secondary | ICD-10-CM | POA: Diagnosis not present

## 2024-01-27 DIAGNOSIS — C7931 Secondary malignant neoplasm of brain: Secondary | ICD-10-CM | POA: Diagnosis not present

## 2024-01-27 LAB — BASIC METABOLIC PANEL WITH GFR
Anion gap: 12 (ref 5–15)
BUN: 15 mg/dL (ref 8–23)
CO2: 19 mmol/L — ABNORMAL LOW (ref 22–32)
Calcium: 9.2 mg/dL (ref 8.9–10.3)
Chloride: 105 mmol/L (ref 98–111)
Creatinine, Ser: 0.62 mg/dL (ref 0.44–1.00)
GFR, Estimated: >60 mL/min (ref >60)
Glucose, Bld: 149 mg/dL — ABNORMAL HIGH (ref 70–99)
Potassium: 4.1 mmol/L (ref 3.5–5.1)
Sodium: 136 mmol/L (ref 135–145)

## 2024-01-27 LAB — CBC
HCT: 36 % (ref 36.0–46.0)
Hemoglobin: 11.8 g/dL — ABNORMAL LOW (ref 12.0–15.0)
MCH: 31.2 pg (ref 26.0–34.0)
MCHC: 32.8 g/dL (ref 30.0–36.0)
MCV: 95.2 fL (ref 80.0–100.0)
Platelets: 371 K/uL (ref 150–400)
RBC: 3.78 MIL/uL — ABNORMAL LOW (ref 3.87–5.11)
RDW: 15.5 % (ref 11.5–15.5)
WBC: 13 K/uL — ABNORMAL HIGH (ref 4.0–10.5)
nRBC: 0 % (ref 0.0–0.2)

## 2024-01-27 LAB — GLUCOSE, CAPILLARY
Glucose-Capillary: 252 mg/dL — ABNORMAL HIGH (ref 70–99)
Glucose-Capillary: 159 mg/dL — ABNORMAL HIGH (ref 70–99)
Glucose-Capillary: 140 mg/dL — ABNORMAL HIGH (ref 70–99)
Glucose-Capillary: 194 mg/dL — ABNORMAL HIGH (ref 70–99)

## 2024-01-27 MED ORDER — GADOBUTROL 1 MMOL/ML IV SOLN
4.0000 mL | Freq: Once | INTRAVENOUS | Status: AC | PRN
  Administered 2024-01-27: 4 mL via INTRAVENOUS

## 2024-01-27 MED ORDER — IPRATROPIUM-ALBUTEROL 0.5-2.5 (3) MG/3ML IN SOLN: 3.0000 mL | RESPIRATORY_TRACT | Status: DC | PRN

## 2024-01-27 MED ORDER — PANTOPRAZOLE SODIUM 40 MG PO TBEC
40.0000 mg | DELAYED_RELEASE_TABLET | Freq: Every day | ORAL | Status: DC
  Administered 2024-01-27 – 2024-01-29 (×3): 40 mg via ORAL
  Filled 2024-01-27 (×3): qty 1

## 2024-01-27 MED ORDER — SODIUM CHLORIDE 0.9% FLUSH: 10.0000 mL | INTRAVENOUS | Status: DC | PRN

## 2024-01-27 MED ORDER — CHLORHEXIDINE GLUCONATE CLOTH 2 % EX PADS
6.0000 | MEDICATED_PAD | Freq: Every day | CUTANEOUS | Status: DC
  Administered 2024-01-27 – 2024-01-30 (×4): 6 via TOPICAL

## 2024-01-27 MED ORDER — SODIUM CHLORIDE 0.9% FLUSH
10.0000 mL | Freq: Two times a day (BID) | INTRAVENOUS | Status: DC
  Administered 2024-01-27 – 2024-01-30 (×6): 10 mL

## 2024-01-27 NOTE — Plan of Care (Signed)
 Pt has rested quietly throughout the night with no distress noted. Alert and oriented to person and place. On room air. SR on the monitor. Purewick intact per request of pt. Daughter at bedside. Went to MRI during night. No complaints voiced.     Problem: Skin Integrity: Goal: Risk for impaired skin integrity will decrease Outcome: Progressing   Problem: Tissue Perfusion: Goal: Adequacy of tissue perfusion will improve Outcome: Progressing   Problem: Clinical Measurements: Goal: Ability to maintain clinical measurements within normal limits will improve Outcome: Progressing Goal: Respiratory complications will improve Outcome: Progressing Goal: Cardiovascular complication will be avoided Outcome: Progressing   Problem: Pain Managment: Goal: General experience of comfort will improve and/or be controlled Outcome: Progressing

## 2024-01-27 NOTE — Care Management Important Message (Signed)
 Important Message  Patient Details  Name: Kristy Salazar MRN: [id number] Date of Birth: 1941-05-19   Important Message Given:  Yes - Medicare IM     [name] 01/27/2024, 4:29 PM

## 2024-01-27 NOTE — Evaluation (Signed)
 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 MRI showed two parietal lesions with significant edema. PMH of lung cancer, COPD, DM, HTN, Arthritis, back sx     Clinical Impressions Pt admitted for above, PTA pt lived with granddaughter and always has someone amongst her family members at home with her if needed, she reports ambulating mod I with SPC at home and having some assist with ADLs from family. Pt currently presenting with generalized weakness and poor activity tolerance, noted to also have new R sided vision loss since her fall. Pt needing min A + Rollator for mobility, mostly help with navigation and needed min A to setup A for seated ADLs. Pt visibly fatigued and HR very elevated following 182ft ambulation although pt reported she was fine throughout gait. OT to continue following pt acutely to promote activity tolerance and progress closer to baseline. Patient would benefit from post acute Home OT services to help maximize functional independence in natural environment      If plan is discharge home, recommend the following:   A little help with walking and/or transfers;A little help with bathing/dressing/bathroom;Assistance with cooking/housework     Functional Status Assessment   Patient has had a recent decline in their functional status and demonstrates the ability to make significant improvements in function in a reasonable and predictable amount of time.     Equipment Recommendations   None recommended by OT (pt has rec DME)     Recommendations for Other Services         Precautions/Restrictions   Precautions Precautions: Fall Recall of Precautions/Restrictions: Intact Precaution/Restrictions Comments: R visual  deficits Restrictions Weight Bearing Restrictions Per Provider Order: No     Mobility Bed Mobility Overal bed mobility: Needs Assistance Bed Mobility: Supine to Sit     Supine to sit: Contact guard          Transfers Overall transfer level: Needs assistance Equipment used: Rollator (4 wheels) Transfers: Sit to/from Stand Sit to Stand: Contact guard assist                  Balance Overall balance assessment: Needs assistance Sitting-balance support: No upper extremity supported, Feet supported Sitting balance-Leahy Scale: Fair     Standing balance support: Bilateral upper extremity supported, During functional activity Standing balance-Leahy Scale: Fair Standing balance comment: static stand no AD, Support for dynamic bal.                           ADL either performed or assessed with clinical judgement   ADL Overall ADL's : Needs assistance/impaired Eating/Feeding: Sitting;Set up   Grooming: Standing;Contact guard assist   Upper Body Bathing: Sitting;Set up   Lower Body Bathing: Set up;Sitting/lateral leans   Upper Body Dressing : Sitting;Set up   Lower Body Dressing: Set up;Sitting/lateral leans   Toilet Transfer: Minimal assistance;Rollator (4 wheels);Ambulation   Toileting- Clothing Manipulation and Hygiene: Sit to/from stand;Contact guard assist       Functional mobility during ADLs: Minimal assistance;Rollator (4 wheels) General ADL Comments: Pt needed assist with maneuvering Rollator.     Vision Baseline Vision/History: 1 Wears glasses Ability to See in Adequate Light: 2 Moderately impaired Patient Visual Report: Other (comment) (denied blurry vision but harder  time with seeing vision in R eye.) Vision Assessment?: Yes Eye Alignment: Impaired (comment) (R eye slight lateral deviation) Ocular Range of Motion: Within Functional Limits Alignment/Gaze Preference: Within Defined Limits Tracking/Visual Pursuits: Able to track  stimulus in all quads without difficulty Saccades: Within functional limits Convergence:  (NT) Visual Fields: Right visual field deficit Diplopia Assessment: Other (comment) (denied) Additional Comments: Pt with diminished vision in R eye since the fall, able to see things without challenge in her peripheral vision but increased challenge and needing head movements to see objects nasally in R eye.     Perception Perception: Not tested       Praxis Praxis: WFL       Pertinent Vitals/Pain Pain Assessment Pain Assessment: No/denies pain     Extremity/Trunk Assessment Upper Extremity Assessment Upper Extremity Assessment: Generalized weakness   Lower Extremity Assessment Lower Extremity Assessment: Defer to PT evaluation   Cervical / Trunk Assessment Cervical / Trunk Assessment: Normal   Communication Communication Communication: Impaired Factors Affecting Communication: Hearing impaired   Cognition Arousal: Alert Behavior During Therapy: WFL for tasks assessed/performed Cognition: No apparent impairments             OT - Cognition Comments: Pt is HOH but has hearding aides                 Following commands: Intact       Cueing  General Comments   Cueing Techniques: Verbal cues  Hr up to 158 bpm max with 159ft activity, BP stable and HR back down to 120s following end of session.   Exercises     Shoulder Instructions      Home Living Family/patient expects to be discharged to:: Private residence Living Arrangements: Children (SIL and daughter) Available Help at Discharge: Family;Available 24 hours/day (someone is always there) Type of Home: House Home Access: Stairs to enter [employer] of Steps: 6 Entrance Stairs-Rails: Right;Left;Can reach both Home Layout: One level     Bathroom Shower/Tub: Chief Strategy Officer: Handicapped height     Home Equipment: Cane - single Librarian, academic (2 wheels);Rollator (4  wheels);Grab bars - tub/shower;Tub bench   Additional Comments: 2 falls from blacking out.      Prior Functioning/Environment Prior Level of Function : Independent/Modified Independent;History of Falls (last six months)             Mobility Comments: uses walker in community, [facility] in home ADLs Comments: family reprots sometimes they assist with bathing/dressing.    OT Problem List: Decreased strength;Decreased activity tolerance;Impaired balance (sitting and/or standing)   OT Treatment/Interventions: Self-care/ADL training;Therapeutic exercise;Balance training;Patient/family education;Energy conservation;Therapeutic activities;DME and/or AE instruction;Visual/perceptual remediation/compensation      OT Goals(Current goals can be found in the care plan section)   Acute Rehab OT Goals Patient Stated Goal: to get stronger OT Goal Formulation: With patient Time For Goal Achievement: 02/10/24 Potential to Achieve Goals: Good ADL Goals Pt Will Perform Grooming: standing;with supervision Pt Will Perform Lower Body Bathing: with modified independence;sitting/lateral leans Pt Will Perform Lower Body Dressing: sit to/from stand;with modified independence Pt Will Transfer to Toilet: with supervision;ambulating Additional ADL Goal #1: Pt will demonstrate ind use of energy conservation strategies to complete ADLs and mobility   OT Frequency:  Min 2X/week    Co-evaluation       OT goals addressed during session: ADL's and self-care;Proper use of Adaptive equipment and DME      AM-PAC OT 6 Clicks Daily Activity  Outcome Measure Help from another person eating meals?: A Little Help from another person taking care of personal grooming?: A Little Help from another person toileting, which includes using toliet, bedpan, or urinal?: A Little Help from another person bathing (including washing, rinsing, drying)?: A Little Help from another person to put on and taking off regular  upper body clothing?: A Little Help from another person to put on and taking off regular lower body clothing?: A Little 6 Click Score: 18   End of Session Equipment Utilized During Treatment: Gait belt;Rollator (4 wheels) Nurse Communication: Mobility status  Activity Tolerance: Patient tolerated treatment well Patient left: in bed;with call bell/phone within reach;with family/visitor present;with bed alarm set  OT Visit Diagnosis: Unsteadiness on feet (R26.81);Other abnormalities of gait and mobility (R26.89);Muscle weakness (generalized) (M62.81)                Time: 8879-8852 OT Time Calculation (min): 27 min Charges:  OT General Charges $OT Visit: 1 Visit OT Evaluation $OT Eval Moderate Complexity: 1 Mod  01/27/2024  AB, OTR/L  Acute Rehabilitation Services  Office: [phone]   Curtistine JONETTA Das 01/27/2024, 1:09 PM

## 2024-01-27 NOTE — Progress Notes (Signed)
 PROGRESS NOTE        PATIENT DETAILS Name: Kristy Salazar Age: 83 y.o. Sex: female Date of Birth: 08-Apr-1941 Admit Date: 01/25/2024 Admitting Physician Marsa KATHEE Scurry, MD ERE:Aojwi, Kennieth, MD  Brief Summary: Patient is a 83 y.o.  female with history of non-small cell cancer of the lung-presented with frequent falls-found to have bilateral brain mets with vasogenic edema.  Significant events: 7/16>>admit to [facility].  Significant studies: 7/16>> CT chest/abdomen: Spiculated right lower lobe mass-slightly diminished from prior study.  Interval bilateral heterogeneous consolidations with bilateral upper. 7/17>> MRI brain: Bilateral parietal metastasis with vasogenic edema.  Small left sided subdural hematoma.  Significant microbiology data: None  Procedures: None  Consults: Radiation oncology Neurosurgery.  Subjective: Lying comfortably in bed-denies any chest pain or shortness of breath.  Objective: Vitals: Blood pressure 118/80, pulse 90, temperature 97.6 F (36.4 C), temperature source Oral, resp. rate 20, height 5' 11 (1.803 m), weight 44.7 kg, SpO2 92%.   Exam: Gen Exam:Alert awake-not in any distress HEENT:atraumatic, normocephalic Chest: B/L clear to auscultation anteriorly CVS:S1S2 regular Abdomen:soft non tender, non distended Extremities:no edema Neurology: Non focal Skin: no rash  Pertinent Labs/Radiology:    Latest Ref Rng & Units 01/27/2024    5:19 AM 01/26/2024    5:32 PM 01/25/2024    2:03 PM  CBC  WBC 4.0 - 10.5 K/uL 13.0  10.0  8.7   Hemoglobin 12.0 - 15.0 g/dL 88.1  88.2  88.6   Hematocrit 36.0 - 46.0 % 36.0  35.5  34.3   Platelets 150 - 400 K/uL 371  371  309     Lab Results  Component Value Date   NA 136 01/27/2024   K 4.1 01/27/2024   CL 105 01/27/2024   CO2 19 (L) 01/27/2024      Assessment/Plan: Brain mets with vasogenic edema Continue Decadron  Radiation oncology/neurosurgery evaluation  ongoing-recommendations to be based on presentation to tumor board conference planned this coming Monday. Mobilize with PT/OT.  SDH Likely traumatic Appears small No focal deficits Mobilize with PT/OT.  Stage IIIb non-small cell cancer of the lung Follows with Dr. Sherrod.  COPD Not in exacerbation Continues on Eliquis.  History of radiation esophagitis No major issues with dysphagia/odynophagia present. PPI.  Underweight Estimated body mass index is 13.74 kg/m as calculated from the following:   Height as of this encounter: 5' 11 (1.803 m).   Weight as of this encounter: 44.7 kg.   Code status:   Code Status: Full Code   DVT Prophylaxis: SCDs Start: 01/25/24 1705   Family Communication: Daughter at bedside   Disposition Plan: Status is: Inpatient Remains inpatient appropriate because: Severity of illness   Planned Discharge Destination:Home   Diet: Diet Order             Diet regular Room service appropriate? Yes; Fluid consistency: Thin  Diet effective now                     Antimicrobial agents: Anti-infectives (From admission, onward)    None        MEDICATIONS: Scheduled Meds:  dexamethasone  (DECADRON ) injection  4 mg Intravenous Q6H   insulin  aspart  0-9 Units Subcutaneous TID WC   montelukast   10 mg Oral Daily   rosuvastatin   10 mg Oral Daily   sodium chloride  flush  3 mL Intravenous  Q12H   umeclidinium bromide   1 puff Inhalation Daily   Continuous Infusions: PRN Meds:.acetaminophen  **OR** acetaminophen , guaiFENesin , ipratropium-albuterol , polyethylene glycol   I have personally reviewed following labs and imaging studies  LABORATORY DATA: CBC: Recent Labs  Lab 01/25/24 1403 01/26/24 1732 01/27/24 0519  WBC 8.7 10.0 13.0*  NEUTROABS 7.0  --   --   HGB 11.3* 11.7* 11.8*  HCT 34.3* 35.5* 36.0  MCV 95.8 95.7 95.2  PLT 309 371 371    Basic Metabolic Panel: Recent Labs  Lab 01/25/24 1403 01/26/24 0530  01/27/24 0519  NA 136 137 136  K 3.6 4.5 4.1  CL 105 107 105  CO2 19* 15* 19*  GLUCOSE 89 138* 149*  BUN 13 15 15   CREATININE 0.58 0.70 0.62  CALCIUM  9.3 9.2 9.2  MG 2.3  --   --     GFR: Estimated Creatinine Clearance: 39.6 mL/min (by C-G formula based on SCr of 0.62 mg/dL).  Liver Function Tests: Recent Labs  Lab 01/25/24 1403 01/26/24 0530  AST 35 32  ALT 42 31  ALKPHOS 108 107  BILITOT 0.6 0.7  PROT 7.7 7.8  ALBUMIN 2.8* 2.8*   No results for input(s): LIPASE, AMYLASE in the last 168 hours. No results for input(s): AMMONIA in the last 168 hours.  Coagulation Profile: No results for input(s): INR, PROTIME in the last 168 hours.  Cardiac Enzymes: No results for input(s): CKTOTAL, CKMB, CKMBINDEX, TROPONINI in the last 168 hours.  BNP (last 3 results) No results for input(s): PROBNP in the last 8760 hours.  Lipid Profile: No results for input(s): CHOL, HDL, LDLCALC, TRIG, CHOLHDL, LDLDIRECT in the last 72 hours.  Thyroid  Function Tests: No results for input(s): TSH, T4TOTAL, FREET4, T3FREE, THYROIDAB in the last 72 hours.  Anemia Panel: No results for input(s): VITAMINB12, FOLATE, FERRITIN, TIBC, IRON, RETICCTPCT in the last 72 hours.  Urine analysis:    Component Value Date/Time   COLORURINE YELLOW 01/25/2024 1619   APPEARANCEUR CLEAR 01/25/2024 1619   LABSPEC 1.011 01/25/2024 1619   PHURINE 6.0 01/25/2024 1619   GLUCOSEU >=500 (A) 01/25/2024 1619   HGBUR NEGATIVE 01/25/2024 1619   BILIRUBINUR NEGATIVE 01/25/2024 1619   KETONESUR NEGATIVE 01/25/2024 1619   PROTEINUR NEGATIVE 01/25/2024 1619   UROBILINOGEN 0.2 05/10/2011 0930   NITRITE NEGATIVE 01/25/2024 1619   LEUKOCYTESUR NEGATIVE 01/25/2024 1619    Sepsis Labs: Lactic Acid, Venous No results found for: LATICACIDVEN  MICROBIOLOGY: No results found for this or any previous visit (from the past 240 hours).  RADIOLOGY STUDIES/RESULTS: MR  BRAIN W WO CONTRAST Result Date: 01/27/2024 CLINICAL DATA:  83 year old female with lung cancer, biparietal brain metastases on recent MRI. 3 Tesla staging. Left side subdural hematoma. EXAM: MRI HEAD WITHOUT AND WITH CONTRAST TECHNIQUE: Multiplanar, multiecho pulse sequences of the brain and surrounding structures were obtained without and with intravenous contrast. CONTRAST:  4mL GADAVIST  GADOBUTROL  1 MMOL/ML IV SOLN COMPARISON:  Brain MRI 01/25/2024 and earlier. FINDINGS: Brain: Posterior left hemisphere parieto-occipital sulcus level rim enhancing mass is 3.5 cm long axis (series 1100, image 211). Abundant hemosiderin associated with this lesion. Smaller right inferior parietal lobe more solidly rim enhancing mass is 17 mm long axis (series 1100, image 235). Minimal hemosiderin associated. Smooth and mild dural thickening and enhancement associated with left-side SDH (see below), most apparent on coronal postcontrast. No other no other abnormal intracranial enhancement identified Posterior hemisphere vasogenic edema affecting both parietal lobes, left occipital lobe has not significantly changed. Stable mass effect  on the posterolateral ventricles, more so the left. Superimposed small left side subdural hematoma is stable measuring 2-3 mm. No IVH or other acute intracranial hemorrhage. There is punctate chronic microhemorrhage just anterior to the right parietal metastasis, stable. Basilar cisterns remain normal. No ventriculomegaly. No restricted diffusion to suggest acute infarction. Cervicomedullary junction and pituitary are within normal limits. Stable gray and white matter signal elsewhere including patchy T2 and FLAIR hyperintensity in the pons. Vascular: Major intracranial vascular flow voids are stable. Skull and upper cervical spine: Visualized bone marrow signal is within normal limits. Negative for age visible cervical spine. Sinuses/Orbits: Stable, negative. Other: Mastoids well aerated. Visible  internal auditory structures appear normal. IMPRESSION: 1. Re-demonstrated biparietal metastases with stable vasogenic edema. No new metastatic disease identified. 2. Stable small left side Subdural Hematoma (2-3 mm). Mild associated dural thickening and enhancement. No midline shift. 3. No new intracranial abnormality. Electronically Signed   By: VEAR Hurst M.D.   On: 01/27/2024 04:12   CT HEAD WO CONTRAST ( ) Result Date: 01/26/2024 CLINICAL DATA:  83 year old female with abnormal head CT and brain MRI yesterday, multiple recent falls. Brain metastases suspected on MRI, with small left hemisphere subdural hematoma also. EXAM: CT HEAD WITHOUT CONTRAST TECHNIQUE: Contiguous axial images were obtained from the base of the skull through the vertex without intravenous contrast. RADIATION DOSE REDUCTION: This exam was performed according to the departmental dose-optimization program which includes automated exposure control, adjustment of the mA and/or kV according to patient size and/or use of iterative reconstruction technique. COMPARISON:  Brain MRI and head CT yesterday. FINDINGS: Brain: Mixed density but mostly hyperdense left side subdural hematoma is 4 mm on series 2, image 16 and appears stable. No new intracranial hemorrhage is identified. But there are biparietal mass lesions redemonstrated, with mild hyperdense blood products associated with the larger left side lesion as before. That mass was up to 3.5 cm by MRI. Regional vasogenic edema in the posterior hemispheres appears stable from yesterday. Stable associated relatively mild intracranial mass effect, mass effect on the atria of the lateral ventricle and trace rightward midline shift. No ventriculomegaly. Basilar cisterns remain patent. No new cerebral edema identified. Vascular: Calcified atherosclerosis at the skull base. No suspicious intracranial vascular hyperdensity. Skull: No fracture identified. Sinuses/Orbits: Visualized paranasal sinuses and  mastoids are stable and well aerated. Other: Mild left posterosuperior scalp hematoma or contusion suspected on series 3, image 61. Underlying calvarium intact. No scalp soft tissue gas. Visualized orbit soft tissues are within normal limits. IMPRESSION: 1. Stable since yesterday: - 4 mm Left Side Subdural Hematoma. Overlying left scalp hematoma but no skull fracture identified. - heterogeneous brain metastases in both parietal lobes, larger on the left. Regional cytotoxic edema. 2. Stable mild intracranial mass effect, trace rightward midline shift. 3. No new intracranial abnormality. Electronically Signed   By: VEAR Hurst M.D.   On: 01/26/2024 07:16   CT CHEST ABDOMEN PELVIS W CONTRAST Result Date: 01/25/2024 CLINICAL DATA:  Lung cancer frequent fall EXAM: CT CHEST, ABDOMEN, AND PELVIS WITH CONTRAST TECHNIQUE: Multidetector CT imaging of the chest, abdomen and pelvis was performed following the standard protocol during bolus administration of intravenous contrast. RADIATION DOSE REDUCTION: This exam was performed according to the departmental dose-optimization program which includes automated exposure control, adjustment of the mA and/or kV according to patient size and/or use of iterative reconstruction technique. CONTRAST:  40mL OMNIPAQUE  IOHEXOL  350 MG/ML SOLN COMPARISON:  CT 11/11/2023, 09/09/2023, PET CT 05/19/2023, chest CT 08/15/2023, 07/04/2023 FINDINGS: CT CHEST  FINDINGS Cardiovascular: Moderate aortic atherosclerosis. No aneurysm. Normal cardiac size. Coronary vascular calcification. No pericardial effusion. Right-sided central venous port tip at the distal SVC. Mediastinum/Nodes: Patent trachea. No thyroid  mass. Pretracheal lymph node on series 3, image 25 measures 8 mm, previously 8 mm. Similar subcentimeter precarinal lymph node. Esophagus within normal limits. Lungs/Pleura: Advanced emphysema. Spiculated right lower lobe pulmonary mass measures about 3 x 2.6 cm on series 4, image 133, previously 33 x  28 mm. Mucoid impaction within slightly dilated right lower lobe bronchi proximal to the mass. Previously noted medial right lower lobe subpleural focus of consolidation is largely resolved, however interval multifocal bilateral heterogeneous consolidations within the bilateral upper lobes, subpleural right middle lobe and the right lower lobe. Right lower lobe lung mass is contiguous with consolidative process posteriorly that extends to the pleural surface. Left lower lobe subpleural nodularity measuring about 6 mm on series 4, image 126, previously 6 mm. Subpleural left upper lobe nodularity measures about 14 x 7 mm on series 4, image 43, previously 13 x 7 mm. Right apical scarring. No pleural effusion or pneumothorax. New left upper lobe 7 mm ground-glass nodule, series 4, image 103. Possible small spiculated 6 mm focus series 4 image 103. Musculoskeletal: No acute or suspicious osseous abnormality. CT ABDOMEN PELVIS FINDINGS Hepatobiliary: Gallstone. No focal hepatic abnormality. No biliary dilatation. Pancreas: Unremarkable. No pancreatic ductal dilatation or surrounding inflammatory changes. Spleen: Normal in size without focal abnormality. Adrenals/Urinary Tract: Adrenal glands are within normal limits. Kidneys show no hydronephrosis. Limited assessment of the collecting systems due to excreted contrast. The bladder is unremarkable aside from dilute contrast. Stomach/Bowel: Stomach nonenlarged. No dilated small bowel. No acute bowel wall thickening. Vascular/Lymphatic: Advanced calcific aortoiliac disease. No aneurysm. No suspicious lymph nodes. Reproductive: Hysterectomy.  No adnexal mass Other: Negative for pelvic effusion or free air Musculoskeletal: Left hip replacement. AVN of the right femoral head without collapse. No acute osseous abnormality IMPRESSION: 1. Advanced emphysema. Spiculated right lower lobe pulmonary mass measures about 3 x 2.6 cm, stable to slightly diminished, previously 33 x 28 mm.  Previously noted medial right lower lobe subpleural focus of consolidation is largely resolved, however interval multifocal bilateral heterogeneous consolidations within the bilateral upper lobes, subpleural right middle lobe and the right lower lobe. Few new ground-glass pulmonary nodules, most evident in the left upper lobe. Findings are favored to be infectious or inflammatory in etiology but follow-up chest CT recommended as metastatic nodules could potentially be obscured by consolidative airspace disease and for follow-up of the new small left upper lobe pulmonary nodules which are indeterminate in origin. 2. No CT evidence for metastatic disease within the abdomen or pelvis. 3. Gallstone. Aortic Atherosclerosis (ICD10-I70.0) and Emphysema (ICD10-J43.9). Electronically Signed   By: Luke Bun M.D.   On: 01/25/2024 20:02   MR Brain W and Wo Contrast Result Date: 01/25/2024 CLINICAL DATA:  Brain/CNS neoplasm, assess treatment response. Multiple recent falls. EXAM: MRI HEAD WITHOUT AND WITH CONTRAST TECHNIQUE: Multiplanar, multiecho pulse sequences of the brain and surrounding structures were obtained without and with intravenous contrast. CONTRAST:  5mL GADAVIST  GADOBUTROL  1 MMOL/ML IV SOLN COMPARISON:  Head CT 01/25/2024 and MRI 05/13/2023 FINDINGS: Brain: As seen on today's earlier head CT, there are bilateral parietal masses with the left being hemorrhagic. Both lesions demonstrate irregular peripheral enhancement and measure 1.6 x 1.5 cm on the right and 3.5 x 2.7 cm on the left. There is associated moderately extensive vasogenic edema in the left greater than right posterior cerebral  hemispheres with mild mass effect on the left lateral ventricle. A small subdural hematoma over the left lateral cerebral convexity measuring up to 5 mm in thickness is unchanged from today's CT. There is no significant midline shift. Small T2 hyperintensities elsewhere in the cerebral white matter and pons are  nonspecific but compatible with mild chronic small vessel ischemic disease with changes in the pons having progressed from the prior MRI. No acute infarct or hydrocephalus is evident. There is mild cerebral atrophy. Vascular: Major intracranial vascular flow voids are preserved. Skull and upper cervical spine: No suspicious marrow lesion. Sinuses/Orbits: Bilateral cataract extraction. Paranasal sinuses and mastoid air cells are clear. Other: Left-sided scalp soft tissue swelling. IMPRESSION: 1. Bilateral parietal masses most concerning for metastatic disease. Moderately extensive vasogenic edema without midline shift. 2. Unchanged small left cerebral convexity subdural hematoma. 3. Mild chronic small vessel ischemic disease. Electronically Signed   By: Dasie Hamburg M.D.   On: 01/25/2024 18:53   CT Cervical Spine Wo Contrast Result Date: 01/25/2024 CLINICAL DATA:  Neck trauma (Age >= 65y) EXAM: CT CERVICAL SPINE WITHOUT CONTRAST TECHNIQUE: Multidetector CT imaging of the cervical spine was performed without intravenous contrast. Multiplanar CT image reconstructions were also generated. RADIATION DOSE REDUCTION: This exam was performed according to the departmental dose-optimization program which includes automated exposure control, adjustment of the mA and/or kV according to patient size and/or use of iterative reconstruction technique. COMPARISON:  None Available. FINDINGS: Alignment: No subluxation. Slight degenerative anterolisthesis of C3 on C4 and retrolisthesis of C5 on C6. Skull base and vertebrae: No acute fracture. No primary bone lesion or focal pathologic process. Soft tissues and spinal canal: No prevertebral fluid or swelling. No visible canal hematoma. Disc levels: Moderate degenerative disc disease in the lower cervical spine. Advanced bilateral degenerative facet disease diffusely. Multilevel bilateral neural foraminal narrowing. Focal disc herniation centrally at C5-6 causing central spinal  stenosis. Upper chest: Biapical scarring.  Emphysema. Other: None IMPRESSION: Degenerative disc and facet disease as described above. Central disc herniation at C5-6 with central spinal stenosis. No acute bony abnormality. Electronically Signed   By: Franky Crease M.D.   On: 01/25/2024 15:33   CT Head Wo Contrast Result Date: 01/25/2024 CLINICAL DATA:  Head trauma, moderate-severe EXAM: CT HEAD WITHOUT CONTRAST TECHNIQUE: Contiguous axial images were obtained from the base of the skull through the vertex without intravenous contrast. RADIATION DOSE REDUCTION: This exam was performed according to the departmental dose-optimization program which includes automated exposure control, adjustment of the mA and/or kV according to patient size and/or use of iterative reconstruction technique. COMPARISON:  None Available. FINDINGS: Brain: There appear to be bilateral posterior parietal mass lesions, measuring approximately 2.7 cm on the left with areas of hemorrhage noted, and measuring approximately 1.3 cm on the right. Surrounding vasogenic edema in both parietal lobes. Also noted is a small left subdural hematoma measuring 4-5 mm in thickness. No significant mass effect or midline shift. No hydrocephalus. Vascular: No hyperdense vessel or unexpected calcification. Skull: No acute calvarial abnormality. Sinuses/Orbits: No acute findings Other: None IMPRESSION: Concern for bilateral posterior parietal mass lesions, left larger than right. Associated areas of hemorrhage within and surrounding the left posterior parietal mass lesion along with 4-5 mm left subdural hematoma. Associated vasogenic edema throughout the parietal lobes bilaterally. Critical Value/emergent results were called by telephone at the time of interpretation on 01/25/2024 at 3:29 pm to provider WHITNEY PLUNKETT , who verbally acknowledged these results. Electronically Signed   By: Franky Crease M.D.  On: 01/25/2024 15:29   DG Chest 2 View Result Date:  01/25/2024 CLINICAL DATA:  Recurrent falls. EXAM: CHEST - 2 VIEW COMPARISON:  11/13/2022. FINDINGS: Bilateral lungs appear hyperexpanded and hyperlucent with coarse bronchovascular markings, in keeping with COPD. Bilateral lungs otherwise appear clear. No dense consolidation or lung collapse. Bilateral costophrenic angles are clear. Normal cardio-mediastinal silhouette. No acute osseous abnormalities. The soft tissues are within normal limits. Right-sided CT Port-A-Cath is seen with its tip overlying the cavoatrial junction region. IMPRESSION: No active cardiopulmonary disease. COPD. Electronically Signed   By: Ree Molt M.D.   On: 01/25/2024 13:22     LOS: 2 days   Donalda Applebaum, MD  [facility]    To contact the attending provider between 7A-7P or the covering provider during after hours 7P-7A, please log into the web site [email or web address] and access using universal [place] password for that web site. If you do not have the password, please call the hospital operator.  01/27/2024, 10:14 AM

## 2024-01-27 NOTE — Progress Notes (Signed)
 [facility] called and transportation scheduled for patient to go to [facility] for an 845 appointment.

## 2024-01-27 NOTE — Evaluation (Signed)
 Physical Therapy Evaluation Patient Details Name: Kristy Salazar MRN: [id number] DOB: Jul 14, 1940 Today's Date: 01/27/2024  History of Present Illness  Pt is an 83 yo female presenting to [facility] on 01/25/24 found to have metastases to the brain with SDH from a fall. MRI showed two parietal lesions with significant edema. PMH of lung cancer, COPD, DM, HTN, Arthritis, back sx  Clinical Impression  Pt presents with admitting diagnosis above. Co-treat with OT. Pt able to ambulate around hallway with rollator with +2 Min A for safety. Pt required constant cues for safety and proximity to rollator. Min A to guide rollator in hallway due to vision deficits. PTA pt reports that she was fairly independent however has been needing some assistance from family recently. Recommend HHPT upon DC. PT will continue to follow.          If plan is discharge home, recommend the following: A little help with walking and/or transfers;A little help with bathing/dressing/bathroom;Direct supervision/assist for medications management;Direct supervision/assist for financial management;Assist for transportation;Help with stairs or ramp for entrance;Supervision due to cognitive status   Can travel by private vehicle        Equipment Recommendations None recommended by PT  Recommendations for Other Services       Functional Status Assessment Patient has had a recent decline in their functional status and demonstrates the ability to make significant improvements in function in a reasonable and predictable amount of time.     Precautions / Restrictions Precautions Precautions: Fall Recall of Precautions/Restrictions: Intact Precaution/Restrictions Comments: R visual deficits Restrictions Weight Bearing Restrictions Per Provider Order: No      Mobility  Bed Mobility Overal bed mobility: Needs Assistance Bed Mobility: Supine to Sit     Supine to sit: Contact guard          Transfers Overall transfer level:  Needs assistance Equipment used: Rollator (4 wheels) Transfers: Sit to/from Stand Sit to Stand: Contact guard assist           General transfer comment: Cues for hand placement    Ambulation/Gait Ambulation/Gait assistance: Min assist, +2 safety/equipment Gait Distance (Feet): 150 Feet Assistive device: Rollator (4 wheels) Gait Pattern/deviations: Trunk flexed, Decreased stride length, Step-through pattern Gait velocity: decreased     General Gait Details: Pt able to ambulate around hallway with rollator with +2 Min A for safety. Pt required constant cues for safety and proximity to rollator. Min A to guide rollator in hallway due to vision deficits.  Stairs            Wheelchair Mobility     Tilt Bed    Modified Rankin (Stroke Patients Only)       Balance Overall balance assessment: Needs assistance Sitting-balance support: No upper extremity supported, Feet supported Sitting balance-Leahy Scale: Fair     Standing balance support: Bilateral upper extremity supported, During functional activity Standing balance-Leahy Scale: Fair Standing balance comment: static stand no AD, Support for dynamic bal.                             Pertinent Vitals/Pain Pain Assessment Pain Assessment: No/denies pain    Home Living Family/patient expects to be discharged to:: Private residence Living Arrangements: Children (SIL and daughter) Available Help at Discharge: Family;Available 24 hours/day (someone is always there) Type of Home: House Home Access: Stairs to enter Entrance Stairs-Rails: Right;Left;Can reach both Entrance Stairs-Number of Steps: 6   Home Layout: One level Home Equipment: [place] -  single point;Rolling Walker (2 wheels);Rollator (4 wheels);Grab bars - tub/shower;Tub bench Additional Comments: 2 falls from blacking out.    Prior Function Prior Level of Function : Independent/Modified Independent;History of Falls (last six months)              Mobility Comments: uses walker in community, [facility] in home ADLs Comments: family reprots sometimes they assist with bathing/dressing.     Extremity/Trunk Assessment   Upper Extremity Assessment Upper Extremity Assessment: Generalized weakness    Lower Extremity Assessment Lower Extremity Assessment: Generalized weakness    Cervical / Trunk Assessment Cervical / Trunk Assessment: Normal  Communication   Communication Communication: Impaired Factors Affecting Communication: Hearing impaired    Cognition Arousal: Alert Behavior During Therapy: WFL for tasks assessed/performed                             Following commands: Intact       Cueing Cueing Techniques: Verbal cues     General Comments General comments (skin integrity, edema, etc.): HR up to 158 during ambulation.    Exercises     Assessment/Plan    PT Assessment Patient needs continued PT services  PT Problem List Decreased strength;Decreased range of motion;Decreased balance;Decreased activity tolerance;Decreased mobility;Decreased coordination;Decreased cognition;Decreased knowledge of use of DME;Decreased safety awareness;Decreased knowledge of precautions;Cardiopulmonary status limiting activity       PT Treatment Interventions DME instruction;Gait training;Stair training;Functional mobility training;Therapeutic activities;Therapeutic exercise;Balance training;Neuromuscular re-education;Cognitive remediation;Patient/family education    PT Goals (Current goals can be found in the Care Plan section)  Acute Rehab PT Goals Patient Stated Goal: to go home PT Goal Formulation: With patient Time For Goal Achievement: 02/10/24 Potential to Achieve Goals: Good    Frequency Min 2X/week     Co-evaluation PT/OT/SLP Co-Evaluation/Treatment: Yes Reason for Co-Treatment: Necessary to address cognition/behavior during functional activity;For patient/therapist safety PT goals addressed during  session: Mobility/safety with mobility;Proper use of DME OT goals addressed during session: ADL's and self-care;Proper use of Adaptive equipment and DME       AM-PAC PT 6 Clicks Mobility  Outcome Measure Help needed turning from your back to your side while in a flat bed without using bedrails?: A Little Help needed moving from lying on your back to sitting on the side of a flat bed without using bedrails?: A Little Help needed moving to and from a bed to a chair (including a wheelchair)?: A Little Help needed standing up from a chair using your arms (e.g., wheelchair or bedside chair)?: A Little Help needed to walk in hospital room?: A Little Help needed climbing 3-5 steps with a railing? : A Lot 6 Click Score: 17    End of Session Equipment Utilized During Treatment: Gait belt Activity Tolerance: Patient tolerated treatment well Patient left: in bed;with call bell/phone within reach;with nursing/sitter in room Nurse Communication: Mobility status PT Visit Diagnosis: Other abnormalities of gait and mobility (R26.89)    Time: 8877-8850 PT Time Calculation (min) (ACUTE ONLY): 27 min   Charges:   PT Evaluation $PT Eval Moderate Complexity: 1 Mod   PT General Charges $$ ACUTE PT VISIT: 1 Visit         Leotha Westermeyer B, PT, DPT Acute Rehab Services [phone]   Egan Berkheimer 01/27/2024, 4:45 PM

## 2024-01-28 ENCOUNTER — Encounter: Payer: Self-pay | Admitting: Radiation Oncology

## 2024-01-28 DIAGNOSIS — C7931 Secondary malignant neoplasm of brain: Secondary | ICD-10-CM | POA: Diagnosis not present

## 2024-01-28 DIAGNOSIS — C3490 Malignant neoplasm of unspecified part of unspecified bronchus or lung: Secondary | ICD-10-CM | POA: Diagnosis not present

## 2024-01-28 DIAGNOSIS — J449 Chronic obstructive pulmonary disease, unspecified: Secondary | ICD-10-CM | POA: Diagnosis not present

## 2024-01-28 DIAGNOSIS — C3431 Malignant neoplasm of lower lobe, right bronchus or lung: Secondary | ICD-10-CM | POA: Diagnosis not present

## 2024-01-28 LAB — CBC
HCT: 35.9 % — ABNORMAL LOW (ref 36.0–46.0)
Hemoglobin: 11.5 g/dL — ABNORMAL LOW (ref 12.0–15.0)
MCH: 31.2 pg (ref 26.0–34.0)
MCHC: 32 g/dL (ref 30.0–36.0)
MCV: 97.3 fL (ref 80.0–100.0)
Platelets: 384 K/uL (ref 150–400)
RBC: 3.69 MIL/uL — ABNORMAL LOW (ref 3.87–5.11)
RDW: 15.5 % (ref 11.5–15.5)
WBC: 12.1 K/uL — ABNORMAL HIGH (ref 4.0–10.5)
nRBC: 0 % (ref 0.0–0.2)

## 2024-01-28 LAB — BASIC METABOLIC PANEL WITH GFR
Anion gap: 8 (ref 5–15)
BUN: 18 mg/dL (ref 8–23)
CO2: 22 mmol/L (ref 22–32)
Calcium: 9.1 mg/dL (ref 8.9–10.3)
Chloride: 107 mmol/L (ref 98–111)
Creatinine, Ser: 0.7 mg/dL (ref 0.44–1.00)
GFR, Estimated: >60 mL/min (ref >60)
Glucose, Bld: 143 mg/dL — ABNORMAL HIGH (ref 70–99)
Potassium: 4.1 mmol/L (ref 3.5–5.1)
Sodium: 137 mmol/L (ref 135–145)

## 2024-01-28 LAB — GLUCOSE, CAPILLARY
Glucose-Capillary: 150 mg/dL — ABNORMAL HIGH (ref 70–99)
Glucose-Capillary: 158 mg/dL — ABNORMAL HIGH (ref 70–99)
Glucose-Capillary: 217 mg/dL — ABNORMAL HIGH (ref 70–99)
Glucose-Capillary: 235 mg/dL — ABNORMAL HIGH (ref 70–99)

## 2024-01-28 NOTE — Plan of Care (Signed)

## 2024-01-28 NOTE — Plan of Care (Signed)
  Problem: Coping: Goal: Ability to adjust to condition or change in health will improve Outcome: Progressing   Problem: Clinical Measurements: Goal: Will remain free from infection Outcome: Progressing   Problem: Activity: Goal: Risk for activity intolerance will decrease Outcome: Progressing   Problem: Safety: Goal: Ability to remain free from injury will improve Outcome: Progressing

## 2024-01-28 NOTE — Progress Notes (Signed)
 PROGRESS NOTE        PATIENT DETAILS Name: Kristy Salazar Age: 83 y.o. Sex: female Date of Birth: 1940/12/02 Admit Date: 01/25/2024 Admitting Physician Marsa KATHEE Scurry, MD ERE:Aojwi, Kennieth, MD  Brief Summary: Patient is a 83 y.o.  female with history of non-small cell cancer of the lung-presented with frequent falls-found to have bilateral brain mets with vasogenic edema.  Significant events: 7/16>>admit to [facility].  Significant studies: 7/16>> CT chest/abdomen: Spiculated right lower lobe mass-slightly diminished from prior study.  Interval bilateral heterogeneous consolidations with bilateral upper. 7/17>> MRI brain: Bilateral parietal metastasis with vasogenic edema.  Small left sided subdural hematoma.  Significant microbiology data: None  Procedures: None  Consults: Radiation oncology Neurosurgery.  Subjective: No major issues overnight.  Lying comfortably in bed.  Objective: Vitals: Blood pressure 108/62, pulse 90, temperature 98.2 F (36.8 C), temperature source Oral, resp. rate (!) 22, height 5' 11 (1.803 m), weight 44.7 kg, SpO2 92%.   Exam: Awake/alert Nonfocal exam No leg edema.  Pertinent Labs/Radiology:    Latest Ref Rng & Units 01/28/2024    4:21 AM 01/27/2024    5:19 AM 01/26/2024    5:32 PM  CBC  WBC 4.0 - 10.5 K/uL 12.1  13.0  10.0   Hemoglobin 12.0 - 15.0 g/dL 88.4  88.1  88.2   Hematocrit 36.0 - 46.0 % 35.9  36.0  35.5   Platelets 150 - 400 K/uL 384  371  371     Lab Results  Component Value Date   NA 137 01/28/2024   K 4.1 01/28/2024   CL 107 01/28/2024   CO2 22 01/28/2024      Assessment/Plan: Brain mets with vasogenic edema Continue Decadron  Radiation oncology/neurosurgery evaluation ongoing-recommendations to be based on presentation to tumor board conference planned this coming Monday. Mobilize with PT/OT.  SDH Likely traumatic Appears small No focal deficits Continue to mobilize with  PT/OT.  Stage IIIb non-small cell cancer of the lung Follows with Dr. Sherrod.  COPD Not in exacerbation Continues on Eliquis.  History of radiation esophagitis No major issues with dysphagia/odynophagia present. PPI.  Underweight Estimated body mass index is 13.74 kg/m as calculated from the following:   Height as of this encounter: 5' 11 (1.803 m).   Weight as of this encounter: 44.7 kg.   Code status:   Code Status: Full Code   DVT Prophylaxis: SCDs Start: 01/25/24 1705   Family Communication: Daughter at bedside   Disposition Plan: Status is: Inpatient Remains inpatient appropriate because: Severity of illness   Planned Discharge Destination:Home   Diet: Diet Order             Diet regular Room service appropriate? Yes; Fluid consistency: Thin  Diet effective now                     Antimicrobial agents: Anti-infectives (From admission, onward)    None        MEDICATIONS: Scheduled Meds:  Chlorhexidine  Gluconate Cloth  6 each Topical Daily   dexamethasone  (DECADRON ) injection  4 mg Intravenous Q6H   insulin  aspart  0-9 Units Subcutaneous TID WC   montelukast   10 mg Oral Daily   pantoprazole   40 mg Oral Q1200   rosuvastatin   10 mg Oral Daily   sodium chloride  flush  10-40 mL Intracatheter Q12H   sodium chloride   flush  3 mL Intravenous Q12H   umeclidinium bromide   1 puff Inhalation Daily   Continuous Infusions: PRN Meds:.acetaminophen  **OR** acetaminophen , guaiFENesin , ipratropium-albuterol , polyethylene glycol, sodium chloride  flush   I have personally reviewed following labs and imaging studies  LABORATORY DATA: CBC: Recent Labs  Lab 01/25/24 1403 01/26/24 1732 01/27/24 0519 01/28/24 0421  WBC 8.7 10.0 13.0* 12.1*  NEUTROABS 7.0  --   --   --   HGB 11.3* 11.7* 11.8* 11.5*  HCT 34.3* 35.5* 36.0 35.9*  MCV 95.8 95.7 95.2 97.3  PLT 309 371 371 384    Basic Metabolic Panel: Recent Labs  Lab 01/25/24 1403 01/26/24 0530  01/27/24 0519 01/28/24 0421  NA 136 137 136 137  K 3.6 4.5 4.1 4.1  CL 105 107 105 107  CO2 19* 15* 19* 22  GLUCOSE 89 138* 149* 143*  BUN 13 15 15 18   CREATININE 0.58 0.70 0.62 0.70  CALCIUM  9.3 9.2 9.2 9.1  MG 2.3  --   --   --     GFR: Estimated Creatinine Clearance: 39.6 mL/min (by C-G formula based on SCr of 0.7 mg/dL).  Liver Function Tests: Recent Labs  Lab 01/25/24 1403 01/26/24 0530  AST 35 32  ALT 42 31  ALKPHOS 108 107  BILITOT 0.6 0.7  PROT 7.7 7.8  ALBUMIN 2.8* 2.8*   No results for input(s): LIPASE, AMYLASE in the last 168 hours. No results for input(s): AMMONIA in the last 168 hours.  Coagulation Profile: No results for input(s): INR, PROTIME in the last 168 hours.  Cardiac Enzymes: No results for input(s): CKTOTAL, CKMB, CKMBINDEX, TROPONINI in the last 168 hours.  BNP (last 3 results) No results for input(s): PROBNP in the last 8760 hours.  Lipid Profile: No results for input(s): CHOL, HDL, LDLCALC, TRIG, CHOLHDL, LDLDIRECT in the last 72 hours.  Thyroid  Function Tests: No results for input(s): TSH, T4TOTAL, FREET4, T3FREE, THYROIDAB in the last 72 hours.  Anemia Panel: No results for input(s): VITAMINB12, FOLATE, FERRITIN, TIBC, IRON, RETICCTPCT in the last 72 hours.  Urine analysis:    Component Value Date/Time   COLORURINE YELLOW 01/25/2024 1619   APPEARANCEUR CLEAR 01/25/2024 1619   LABSPEC 1.011 01/25/2024 1619   PHURINE 6.0 01/25/2024 1619   GLUCOSEU >=500 (A) 01/25/2024 1619   HGBUR NEGATIVE 01/25/2024 1619   BILIRUBINUR NEGATIVE 01/25/2024 1619   KETONESUR NEGATIVE 01/25/2024 1619   PROTEINUR NEGATIVE 01/25/2024 1619   UROBILINOGEN 0.2 05/10/2011 0930   NITRITE NEGATIVE 01/25/2024 1619   LEUKOCYTESUR NEGATIVE 01/25/2024 1619    Sepsis Labs: Lactic Acid, Venous No results found for: LATICACIDVEN  MICROBIOLOGY: No results found for this or any previous visit (from the  past 240 hours).  RADIOLOGY STUDIES/RESULTS: MR BRAIN W WO CONTRAST Result Date: 01/27/2024 CLINICAL DATA:  84 year old female with lung cancer, biparietal brain metastases on recent MRI. 3 Tesla staging. Left side subdural hematoma. EXAM: MRI HEAD WITHOUT AND WITH CONTRAST TECHNIQUE: Multiplanar, multiecho pulse sequences of the brain and surrounding structures were obtained without and with intravenous contrast. CONTRAST:  4mL GADAVIST  GADOBUTROL  1 MMOL/ML IV SOLN COMPARISON:  Brain MRI 01/25/2024 and earlier. FINDINGS: Brain: Posterior left hemisphere parieto-occipital sulcus level rim enhancing mass is 3.5 cm long axis (series 1100, image 211). Abundant hemosiderin associated with this lesion. Smaller right inferior parietal lobe more solidly rim enhancing mass is 17 mm long axis (series 1100, image 235). Minimal hemosiderin associated. Smooth and mild dural thickening and enhancement associated with left-side SDH (see below),  most apparent on coronal postcontrast. No other no other abnormal intracranial enhancement identified Posterior hemisphere vasogenic edema affecting both parietal lobes, left occipital lobe has not significantly changed. Stable mass effect on the posterolateral ventricles, more so the left. Superimposed small left side subdural hematoma is stable measuring 2-3 mm. No IVH or other acute intracranial hemorrhage. There is punctate chronic microhemorrhage just anterior to the right parietal metastasis, stable. Basilar cisterns remain normal. No ventriculomegaly. No restricted diffusion to suggest acute infarction. Cervicomedullary junction and pituitary are within normal limits. Stable gray and white matter signal elsewhere including patchy T2 and FLAIR hyperintensity in the pons. Vascular: Major intracranial vascular flow voids are stable. Skull and upper cervical spine: Visualized bone marrow signal is within normal limits. Negative for age visible cervical spine. Sinuses/Orbits: Stable,  negative. Other: Mastoids well aerated. Visible internal auditory structures appear normal. IMPRESSION: 1. Re-demonstrated biparietal metastases with stable vasogenic edema. No new metastatic disease identified. 2. Stable small left side Subdural Hematoma (2-3 mm). Mild associated dural thickening and enhancement. No midline shift. 3. No new intracranial abnormality. Electronically Signed   By: VEAR Hurst M.D.   On: 01/27/2024 04:12     LOS: 3 days   Donalda Applebaum, MD  [facility]    To contact the attending provider between 7A-7P or the covering provider during after hours 7P-7A, please log into the web site [email or web address] and access using universal [place] password for that web site. If you do not have the password, please call the hospital operator.  01/28/2024, 1:11 PM

## 2024-01-29 DIAGNOSIS — C3490 Malignant neoplasm of unspecified part of unspecified bronchus or lung: Secondary | ICD-10-CM | POA: Diagnosis not present

## 2024-01-29 DIAGNOSIS — C7931 Secondary malignant neoplasm of brain: Secondary | ICD-10-CM | POA: Diagnosis not present

## 2024-01-29 DIAGNOSIS — C3431 Malignant neoplasm of lower lobe, right bronchus or lung: Secondary | ICD-10-CM | POA: Diagnosis not present

## 2024-01-29 DIAGNOSIS — J449 Chronic obstructive pulmonary disease, unspecified: Secondary | ICD-10-CM | POA: Diagnosis not present

## 2024-01-29 LAB — CBC
HCT: 35.2 % — ABNORMAL LOW (ref 36.0–46.0)
Hemoglobin: 11.4 g/dL — ABNORMAL LOW (ref 12.0–15.0)
MCH: 31.3 pg (ref 26.0–34.0)
MCHC: 32.4 g/dL (ref 30.0–36.0)
MCV: 96.7 fL (ref 80.0–100.0)
Platelets: 379 K/uL (ref 150–400)
RBC: 3.64 MIL/uL — ABNORMAL LOW (ref 3.87–5.11)
RDW: 15.3 % (ref 11.5–15.5)
WBC: 14 K/uL — ABNORMAL HIGH (ref 4.0–10.5)
nRBC: 0 % (ref 0.0–0.2)

## 2024-01-29 LAB — GLUCOSE, CAPILLARY
Glucose-Capillary: 134 mg/dL — ABNORMAL HIGH (ref 70–99)
Glucose-Capillary: 241 mg/dL — ABNORMAL HIGH (ref 70–99)
Glucose-Capillary: 247 mg/dL — ABNORMAL HIGH (ref 70–99)
Glucose-Capillary: 155 mg/dL — ABNORMAL HIGH (ref 70–99)

## 2024-01-29 LAB — BASIC METABOLIC PANEL WITH GFR
Anion gap: 7 (ref 5–15)
BUN: 16 mg/dL (ref 8–23)
CO2: 23 mmol/L (ref 22–32)
Calcium: 8.6 mg/dL — ABNORMAL LOW (ref 8.9–10.3)
Chloride: 106 mmol/L (ref 98–111)
Creatinine, Ser: 0.69 mg/dL (ref 0.44–1.00)
GFR, Estimated: >60 mL/min (ref >60)
Glucose, Bld: 140 mg/dL — ABNORMAL HIGH (ref 70–99)
Potassium: 4.1 mmol/L (ref 3.5–5.1)
Sodium: 136 mmol/L (ref 135–145)

## 2024-01-29 NOTE — Plan of Care (Signed)
  Problem: Coping: Goal: Ability to adjust to condition or change in health will improve Outcome: Progressing   Problem: Clinical Measurements: Goal: Ability to maintain clinical measurements within normal limits will improve Outcome: Progressing   Problem: Activity: Goal: Risk for activity intolerance will decrease Outcome: Progressing   Problem: Safety: Goal: Ability to remain free from injury will improve Outcome: Progressing

## 2024-01-29 NOTE — Progress Notes (Signed)
 PROGRESS NOTE        PATIENT DETAILS Name: Kristy Salazar Age: 83 y.o. Sex: female Date of Birth: 03/02/1941 Admit Date: 01/25/2024 Admitting Physician Marsa KATHEE Scurry, MD ERE:Aojwi, Kennieth, MD  Brief Summary: Patient is a 83 y.o.  female with history of non-small cell cancer of the lung-presented with frequent falls-found to have bilateral brain mets with vasogenic edema.  Significant events: 7/16>>admit to [facility].  Significant studies: 7/16>> CT chest/abdomen: Spiculated right lower lobe mass-slightly diminished from prior study.  Interval bilateral heterogeneous consolidations with bilateral upper. 7/17>> MRI brain: Bilateral parietal metastasis with vasogenic edema.  Small left sided subdural hematoma.  Significant microbiology data: None  Procedures: None  Consults: Radiation oncology Neurosurgery.  Subjective: No major issues overnight.  Lying comfortably in bed.  No headache.  Objective: Vitals: Blood pressure 137/85, pulse 85, temperature 98.5 F (36.9 C), resp. rate 20, height 5' 11 (1.803 m), weight 44.7 kg, SpO2 93%.   Exam: Awake/alert Nonfocal exam.  Pertinent Labs/Radiology:    Latest Ref Rng & Units 01/29/2024    3:24 AM 01/28/2024    4:21 AM 01/27/2024    5:19 AM  CBC  WBC 4.0 - 10.5 K/uL 14.0  12.1  13.0   Hemoglobin 12.0 - 15.0 g/dL 88.5  88.4  88.1   Hematocrit 36.0 - 46.0 % 35.2  35.9  36.0   Platelets 150 - 400 K/uL 379  384  371     Lab Results  Component Value Date   NA 136 01/29/2024   K 4.1 01/29/2024   CL 106 01/29/2024   CO2 23 01/29/2024      Assessment/Plan: Brain mets with vasogenic edema Continue Decadron  Radiation oncology/neurosurgery evaluation ongoing-recommendations to be based on presentation to tumor board conference planned tomorrow morning. Mobilize with PT/OT.  SDH Likely traumatic Appears small No focal deficits Continue to mobilize with PT/OT.  Stage IIIb non-small cell cancer  of the lung Follows with Dr. Sherrod.  COPD Not in exacerbation Continues on Eliquis.  History of radiation esophagitis No major issues with dysphagia/odynophagia present. PPI.  Underweight Estimated body mass index is 13.74 kg/m as calculated from the following:   Height as of this encounter: 5' 11 (1.803 m).   Weight as of this encounter: 44.7 kg.   Code status:   Code Status: Full Code   DVT Prophylaxis: SCDs Start: 01/25/24 1705   Family Communication: Daughter at bedside   Disposition Plan: Status is: Inpatient Remains inpatient appropriate because: Severity of illness   Planned Discharge Destination:Home   Diet: Diet Order             Diet regular Room service appropriate? Yes; Fluid consistency: Thin  Diet effective now                     Antimicrobial agents: Anti-infectives (From admission, onward)    None        MEDICATIONS: Scheduled Meds:  Chlorhexidine  Gluconate Cloth  6 each Topical Daily   dexamethasone  (DECADRON ) injection  4 mg Intravenous Q6H   insulin  aspart  0-9 Units Subcutaneous TID WC   montelukast   10 mg Oral Daily   pantoprazole   40 mg Oral Q1200   rosuvastatin   10 mg Oral Daily   sodium chloride  flush  10-40 mL Intracatheter Q12H   sodium chloride  flush  3 mL Intravenous  Q12H   umeclidinium bromide   1 puff Inhalation Daily   Continuous Infusions: PRN Meds:.acetaminophen  **OR** acetaminophen , guaiFENesin , ipratropium-albuterol , polyethylene glycol, sodium chloride  flush   I have personally reviewed following labs and imaging studies  LABORATORY DATA: CBC: Recent Labs  Lab 01/25/24 1403 01/26/24 1732 01/27/24 0519 01/28/24 0421 01/29/24 0324  WBC 8.7 10.0 13.0* 12.1* 14.0*  NEUTROABS 7.0  --   --   --   --   HGB 11.3* 11.7* 11.8* 11.5* 11.4*  HCT 34.3* 35.5* 36.0 35.9* 35.2*  MCV 95.8 95.7 95.2 97.3 96.7  PLT 309 371 371 384 379    Basic Metabolic Panel: Recent Labs  Lab 01/25/24 1403  01/26/24 0530 01/27/24 0519 01/28/24 0421 01/29/24 0324  NA 136 137 136 137 136  K 3.6 4.5 4.1 4.1 4.1  CL 105 107 105 107 106  CO2 19* 15* 19* 22 23  GLUCOSE 89 138* 149* 143* 140*  BUN 13 15 15 18 16   CREATININE 0.58 0.70 0.62 0.70 0.69  CALCIUM  9.3 9.2 9.2 9.1 8.6*  MG 2.3  --   --   --   --     GFR: Estimated Creatinine Clearance: 39.6 mL/min (by C-G formula based on SCr of 0.69 mg/dL).  Liver Function Tests: Recent Labs  Lab 01/25/24 1403 01/26/24 0530  AST 35 32  ALT 42 31  ALKPHOS 108 107  BILITOT 0.6 0.7  PROT 7.7 7.8  ALBUMIN 2.8* 2.8*   No results for input(s): LIPASE, AMYLASE in the last 168 hours. No results for input(s): AMMONIA in the last 168 hours.  Coagulation Profile: No results for input(s): INR, PROTIME in the last 168 hours.  Cardiac Enzymes: No results for input(s): CKTOTAL, CKMB, CKMBINDEX, TROPONINI in the last 168 hours.  BNP (last 3 results) No results for input(s): PROBNP in the last 8760 hours.  Lipid Profile: No results for input(s): CHOL, HDL, LDLCALC, TRIG, CHOLHDL, LDLDIRECT in the last 72 hours.  Thyroid  Function Tests: No results for input(s): TSH, T4TOTAL, FREET4, T3FREE, THYROIDAB in the last 72 hours.  Anemia Panel: No results for input(s): VITAMINB12, FOLATE, FERRITIN, TIBC, IRON, RETICCTPCT in the last 72 hours.  Urine analysis:    Component Value Date/Time   COLORURINE YELLOW 01/25/2024 1619   APPEARANCEUR CLEAR 01/25/2024 1619   LABSPEC 1.011 01/25/2024 1619   PHURINE 6.0 01/25/2024 1619   GLUCOSEU >=500 (A) 01/25/2024 1619   HGBUR NEGATIVE 01/25/2024 1619   BILIRUBINUR NEGATIVE 01/25/2024 1619   KETONESUR NEGATIVE 01/25/2024 1619   PROTEINUR NEGATIVE 01/25/2024 1619   UROBILINOGEN 0.2 05/10/2011 0930   NITRITE NEGATIVE 01/25/2024 1619   LEUKOCYTESUR NEGATIVE 01/25/2024 1619    Sepsis Labs: Lactic Acid, Venous No results found for:  LATICACIDVEN  MICROBIOLOGY: No results found for this or any previous visit (from the past 240 hours).  RADIOLOGY STUDIES/RESULTS: No results found.    LOS: 4 days   Donalda Applebaum, MD  [facility]    To contact the attending provider between 7A-7P or the covering provider during after hours 7P-7A, please log into the web site [email or web address] and access using universal  password for that web site. If you do not have the password, please call the hospital operator.  01/29/2024, 10:07 AM

## 2024-01-29 NOTE — Plan of Care (Signed)

## 2024-01-30 ENCOUNTER — Ambulatory Visit
Admit: 2024-01-30 | Discharge: 2024-01-30 | Disposition: A | Discharge status: Home / Self Care | Attending: Radiation Oncology | Admitting: Radiation Oncology

## 2024-01-30 ENCOUNTER — Ambulatory Visit

## 2024-01-30 ENCOUNTER — Other Ambulatory Visit (HOSPITAL_COMMUNITY): Payer: Self-pay

## 2024-01-30 DIAGNOSIS — C7931 Secondary malignant neoplasm of brain: Secondary | ICD-10-CM | POA: Diagnosis not present

## 2024-01-30 DIAGNOSIS — Z95828 Presence of other vascular implants and grafts: Secondary | ICD-10-CM

## 2024-01-30 DIAGNOSIS — S065XAA Traumatic subdural hemorrhage with loss of consciousness status unknown, initial encounter: Secondary | ICD-10-CM | POA: Diagnosis not present

## 2024-01-30 DIAGNOSIS — E119 Type 2 diabetes mellitus without complications: Secondary | ICD-10-CM | POA: Diagnosis not present

## 2024-01-30 DIAGNOSIS — C3431 Malignant neoplasm of lower lobe, right bronchus or lung: Secondary | ICD-10-CM | POA: Diagnosis not present

## 2024-01-30 LAB — BASIC METABOLIC PANEL WITH GFR
Anion gap: 11 (ref 5–15)
BUN: 15 mg/dL (ref 8–23)
CO2: 23 mmol/L (ref 22–32)
Calcium: 8.5 mg/dL — ABNORMAL LOW (ref 8.9–10.3)
Chloride: 104 mmol/L (ref 98–111)
Creatinine, Ser: 0.62 mg/dL (ref 0.44–1.00)
GFR, Estimated: >60 mL/min (ref >60)
Glucose, Bld: 143 mg/dL — ABNORMAL HIGH (ref 70–99)
Potassium: 3.9 mmol/L (ref 3.5–5.1)
Sodium: 138 mmol/L (ref 135–145)

## 2024-01-30 LAB — GLUCOSE, CAPILLARY
Glucose-Capillary: 119 mg/dL — ABNORMAL HIGH (ref 70–99)
Glucose-Capillary: 211 mg/dL — ABNORMAL HIGH (ref 70–99)

## 2024-01-30 LAB — CBC
HCT: 37.5 % (ref 36.0–46.0)
Hemoglobin: 12.3 g/dL (ref 12.0–15.0)
MCH: 31.8 pg (ref 26.0–34.0)
MCHC: 32.8 g/dL (ref 30.0–36.0)
MCV: 96.9 fL (ref 80.0–100.0)
Platelets: 437 K/uL — ABNORMAL HIGH (ref 150–400)
RBC: 3.87 MIL/uL (ref 3.87–5.11)
RDW: 15.3 % (ref 11.5–15.5)
WBC: 16.1 K/uL — ABNORMAL HIGH (ref 4.0–10.5)
nRBC: 0 % (ref 0.0–0.2)

## 2024-01-30 MED ORDER — PANTOPRAZOLE SODIUM 40 MG PO TBEC
40.0000 mg | DELAYED_RELEASE_TABLET | Freq: Every day | ORAL | 0 refills | Status: DC
  Filled 2024-01-30: qty 30, 30d supply, fill #0

## 2024-01-30 MED ORDER — DEXAMETHASONE 4 MG PO TABS
6.0000 mg | ORAL_TABLET | Freq: Four times a day (QID) | ORAL | 0 refills | Status: DC
Start: 2024-01-30 — End: 2024-02-22
  Filled 2024-01-30: qty 120, 20d supply, fill #0

## 2024-01-30 MED ORDER — HEPARIN SOD (PORK) LOCK FLUSH 100 UNIT/ML IV SOLN
500.0000 [IU] | INTRAVENOUS | Status: AC | PRN
  Administered 2024-01-30: 500 [IU]

## 2024-01-30 MED ORDER — HEPARIN SOD (PORK) LOCK FLUSH 100 UNIT/ML IV SOLN
500.0000 [IU] | Freq: Once | INTRAVENOUS | Status: AC
  Administered 2024-01-30: 500 [IU] via INTRAVENOUS

## 2024-01-30 MED ORDER — SODIUM CHLORIDE 0.9% FLUSH
10.0000 mL | Freq: Once | INTRAVENOUS | Status: AC
  Administered 2024-01-30: 10 mL via INTRAVENOUS

## 2024-01-30 NOTE — Progress Notes (Signed)
 PT Cancellation Note  Patient Details Name: EQUILLA QUE MRN: [id number] DOB: 1941-04-05   Cancelled Treatment:    Reason Eval/Treat Not Completed: Patient at procedure or test/unavailable (Pt at [facility] long for cancer treatment. Will follow up tomorrow.)   Kimble Hitchens 01/30/2024, 9:41 AM

## 2024-01-30 NOTE — Plan of Care (Signed)
   Problem: Coping: Goal: Ability to adjust to condition or change in health will improve Outcome: Progressing   Problem: Metabolic: Goal: Ability to maintain appropriate glucose levels will improve Outcome: Progressing   Problem: Clinical Measurements: Goal: Respiratory complications will improve Outcome: Progressing

## 2024-01-30 NOTE — TOC Transition Note (Signed)
 Transition of Care [facility]) - Discharge Note   Patient Details  Name: Kristy Salazar MRN: [id number] Date of Birth: 1940/11/08  Transition of Care [facility] CM/SW Contact:  Robynn Eileen Hoose, RN Phone Number: 01/30/2024, 12:08 PM   Clinical Narrative:  Patient is being discharged today. Lynette with [facility] made aware.     Final next level of care: Home w Home Health Services Barriers to Discharge: No Barriers Identified   Patient Goals and CMS Choice            Discharge Placement                       Discharge Plan and Services Additional resources added to the After Visit Summary for                                       Social Drivers of Health (SDOH) Interventions SDOH Screenings   Food Insecurity: No Food Insecurity (01/27/2024)  Housing: Low Risk  (01/27/2024)  Transportation Needs: Unmet Transportation Needs (01/27/2024)  Utilities: Not At Risk (01/27/2024)  Depression (PHQ2-9): Low Risk  (01/18/2024)  Social Connections: Moderately Integrated (01/27/2024)  Tobacco Use: High Risk (01/25/2024)     Readmission Risk Interventions     No data to display

## 2024-01-30 NOTE — Discharge Summary (Signed)
 PATIENT DETAILS Name: Kristy Salazar Age: 83 y.o. Sex: female Date of Birth: [birth year] MRN: [id number]. Admitting Physician: Marsa KATHEE Scurry, MD ERE:Aojwi, Kennieth, MD  Admit Date: 01/25/2024 Discharge date: 01/30/2024  Recommendations for Outpatient Follow-up:  Follow up with PCP in 1-2 weeks Please obtain CMP/CBC in one week Please ensure follow up with radiation oncology/neurosurgery/medical oncology.    Admitted From:  Home  Disposition: Home health   Discharge Condition: good  CODE STATUS:   Code Status: Full Code   Diet recommendation:  Diet Order             Diet - low sodium heart healthy           Diet regular Room service appropriate? Yes; Fluid consistency: Thin  Diet effective now                    Brief Summary: Patient is a 83 y.o.  female with history of non-small cell cancer of the lung-presented with frequent falls-found to have bilateral brain mets with vasogenic edema.   Significant events: 7/16>>admit to [facility].   Significant studies: 7/16>> CT chest/abdomen: Spiculated right lower lobe mass-slightly diminished from prior study.  Interval bilateral heterogeneous consolidations with bilateral upper. 7/17>> MRI brain: Bilateral parietal metastasis with vasogenic edema.  Small left sided subdural hematoma.   Significant microbiology data: None   Procedures: None   Consults: Radiation oncology Neurosurgery.  Brief Hospital Course: Brain mets with vasogenic edema Stable on Decadron  Radiation simulation completed on 7/21-radiation oncology planning start radiation treatments this coming Thursday.  Ongoing conversations by radiation oncology with neurosurgery to see if patient gets surgical excision of these brain lesions following completion of radiation. Mobilizing well-plans are to discharge home on Decadron  with close outpatient monitoring by her outpatient physicians Will be discharged with home health services.     SDH Likely  traumatic Appears small No focal deficits Continue to mobilize with PT/OT.   Stage IIIb non-small cell cancer of the lung Follows with Dr. Sherrod.   COPD Not in exacerbation Continues on Eliquis.   History of radiation esophagitis No major issues with dysphagia/odynophagia present. PPI.   Underweight Estimated body mass index is 13.74 kg/m as calculated from the following:   Height as of this encounter: 5' 11 (1.803 m).   Weight as of this encounter: 44.7 kg.    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)   DM2 (diabetes mellitus, type 2) (HCC)   Subdural hematoma (HCC)   Vasogenic edema (HCC)   Discharge Instructions:  Activity:  As tolerated with Full fall precautions use walker/cane & assistance as needed  Discharge Instructions     Call MD for:  extreme fatigue   Complete by: As directed    Call MD for:  persistant dizziness or light-headedness   Complete by: As directed    Diet - low sodium heart healthy   Complete by: As directed    Discharge instructions   Complete by: As directed    Follow with Primary MD  Benjamine Kennieth, MD in 1-2 weeks  Follow-up with radiation oncology, general surgery-medical oncology-the office for follow-up appointment  Please get a complete blood count and chemistry panel checked by your Primary MD at your next visit, and again as instructed by your Primary MD.  Get Medicines reviewed and adjusted: Please  take all your medications with you for your next visit with your Primary MD  Laboratory/radiological data: Please request your Primary MD to go over all hospital tests and procedure/radiological results at the follow up, please ask your Primary MD to get all Hospital records sent to his/her office.  In some cases, they will be blood work, cultures and biopsy  results pending at the time of your discharge. Please request that your primary care M.D. follows up on these results.  Also Note the following: If you experience worsening of your admission symptoms, develop shortness of breath, life threatening emergency, suicidal or homicidal thoughts you must seek medical attention immediately by calling 911 or calling your MD immediately  if symptoms less severe.  You must read complete instructions/literature along with all the possible adverse reactions/side effects for all the Medicines you take and that have been prescribed to you. Take any new Medicines after you have completely understood and accpet all the possible adverse reactions/side effects.   Do not drive when taking Pain medications or sleeping medications (Benzodaizepines)  Do not take more than prescribed Pain, Sleep and Anxiety Medications. It is not advisable to combine anxiety,sleep and pain medications without talking with your primary care practitioner  Special Instructions: If you have smoked or chewed Tobacco  in the last 2 yrs please stop smoking, stop any regular Alcohol  and or any Recreational drug use.  Wear Seat belts while driving.  Please note: You were cared for by a hospitalist during your hospital stay. Once you are discharged, your primary care physician will handle any further medical issues. Please note that NO REFILLS for any discharge medications will be authorized once you are discharged, as it is imperative that you return to your primary care physician (or establish a relationship with a primary care physician if you do not have one) for your post hospital discharge needs so that they can reassess your need for medications and monitor your lab values.   Increase activity slowly   Complete by: As directed       Allergies as of 01/30/2024       Reactions   Erythromycin Hives        Medication List     STOP taking these medications    fluconazole  100 MG  tablet Commonly known as: DIFLUCAN    megestrol 40 MG/ML suspension Commonly known as: MEGACE   potassium chloride  20 MEQ/15ML (10%) Soln       TAKE these medications    dexamethasone  4 MG tablet Commonly known as: DECADRON  Take 1.5 tablets (6 mg total) by mouth 4 (four) times daily.   Farxiga 10 MG Tabs tablet Generic drug: dapagliflozin propanediol Take 10 mg by mouth daily.   Fluticasone-Umeclidin-Vilant 100-62.5-25 MCG/INH Aepb Inhale 2 application  into the lungs daily as needed.   HYDROcodone -acetaminophen  5-325 MG tablet Commonly known as: NORCO/VICODIN Take 1 tablet by mouth every 6 (six) hours as needed for moderate pain (pain score 4-6).   montelukast  10 MG tablet Commonly known as: SINGULAIR  Take 10 mg by mouth daily. For seasonal allergies   multivitamin with minerals Tabs tablet Take 1 tablet by mouth daily.   pantoprazole  40 MG tablet Commonly known as: PROTONIX  Take 1 tablet (40 mg total) by mouth daily.   rosuvastatin  10 MG tablet Commonly known as: CRESTOR  Take 10 mg by mouth daily.   sucralfate  1 GM/10ML suspension Commonly known as: CARAFATE  Take 1 g by mouth 4 (four) times daily -  with meals and  at bedtime.   tiotropium 18 MCG inhalation capsule Commonly known as: SPIRIVA  Place 18 mcg into inhaler and inhale daily.        Follow-up Information     Benjamine Aland, MD. Schedule an appointment as soon as possible for a visit in 1 week(s).   Specialty: Family Medicine Contact information: [address] [place], [address] [place] [address] [phone]                Allergies  Allergen Reactions   Erythromycin Hives     Other Procedures/Studies: MR BRAIN W WO CONTRAST Result Date: 01/27/2024 CLINICAL DATA:  83 year old female with lung cancer, biparietal brain metastases on recent MRI. 3 Tesla staging. Left side subdural hematoma. EXAM: MRI HEAD WITHOUT AND WITH CONTRAST TECHNIQUE: Multiplanar, multiecho pulse sequences of the brain  and surrounding structures were obtained without and with intravenous contrast. CONTRAST:  4mL GADAVIST  GADOBUTROL  1 MMOL/ML IV SOLN COMPARISON:  Brain MRI 01/25/2024 and earlier. FINDINGS: Brain: Posterior left hemisphere parieto-occipital sulcus level rim enhancing mass is 3.5 cm long axis (series 1100, image 211). Abundant hemosiderin associated with this lesion. Smaller right inferior parietal lobe more solidly rim enhancing mass is 17 mm long axis (series 1100, image 235). Minimal hemosiderin associated. Smooth and mild dural thickening and enhancement associated with left-side SDH (see below), most apparent on coronal postcontrast. No other no other abnormal intracranial enhancement identified Posterior hemisphere vasogenic edema affecting both parietal lobes, left occipital lobe has not significantly changed. Stable mass effect on the posterolateral ventricles, more so the left. Superimposed small left side subdural hematoma is stable measuring 2-3 mm. No IVH or other acute intracranial hemorrhage. There is punctate chronic microhemorrhage just anterior to the right parietal metastasis, stable. Basilar cisterns remain normal. No ventriculomegaly. No restricted diffusion to suggest acute infarction. Cervicomedullary junction and pituitary are within normal limits. Stable gray and white matter signal elsewhere including patchy T2 and FLAIR hyperintensity in the pons. Vascular: Major intracranial vascular flow voids are stable. Skull and upper cervical spine: Visualized bone marrow signal is within normal limits. Negative for age visible cervical spine. Sinuses/Orbits: Stable, negative. Other: Mastoids well aerated. Visible internal auditory structures appear normal. IMPRESSION: 1. Re-demonstrated biparietal metastases with stable vasogenic edema. No new metastatic disease identified. 2. Stable small left side Subdural Hematoma (2-3 mm). Mild associated dural thickening and enhancement. No midline shift. 3. No  new intracranial abnormality. Electronically Signed   By: VEAR Hurst M.D.   On: 01/27/2024 04:12   CT HEAD WO CONTRAST ( ) Result Date: 01/26/2024 CLINICAL DATA:  83 year old female with abnormal head CT and brain MRI yesterday, multiple recent falls. Brain metastases suspected on MRI, with small left hemisphere subdural hematoma also. EXAM: CT HEAD WITHOUT CONTRAST TECHNIQUE: Contiguous axial images were obtained from the base of the skull through the vertex without intravenous contrast. RADIATION DOSE REDUCTION: This exam was performed according to the departmental dose-optimization program which includes automated exposure control, adjustment of the mA and/or kV according to patient size and/or use of iterative reconstruction technique. COMPARISON:  Brain MRI and head CT yesterday. FINDINGS: Brain: Mixed density but mostly hyperdense left side subdural hematoma is 4 mm on series 2, image 16 and appears stable. No new intracranial hemorrhage is identified. But there are biparietal mass lesions redemonstrated, with mild hyperdense blood products associated with the larger left side lesion as before. That mass was up to 3.5 cm by MRI. Regional vasogenic edema in the posterior hemispheres appears stable from yesterday. Stable associated  relatively mild intracranial mass effect, mass effect on the atria of the lateral ventricle and trace rightward midline shift. No ventriculomegaly. Basilar cisterns remain patent. No new cerebral edema identified. Vascular: Calcified atherosclerosis at the skull base. No suspicious intracranial vascular hyperdensity. Skull: No fracture identified. Sinuses/Orbits: Visualized paranasal sinuses and mastoids are stable and well aerated. Other: Mild left posterosuperior scalp hematoma or contusion suspected on series 3, image 61. Underlying calvarium intact. No scalp soft tissue gas. Visualized orbit soft tissues are within normal limits. IMPRESSION: 1. Stable since yesterday: - 4 mm  Left Side Subdural Hematoma. Overlying left scalp hematoma but no skull fracture identified. - heterogeneous brain metastases in both parietal lobes, larger on the left. Regional cytotoxic edema. 2. Stable mild intracranial mass effect, trace rightward midline shift. 3. No new intracranial abnormality. Electronically Signed   By: VEAR Hurst M.D.   On: 01/26/2024 07:16   CT CHEST ABDOMEN PELVIS W CONTRAST Result Date: 01/25/2024 CLINICAL DATA:  Lung cancer frequent fall EXAM: CT CHEST, ABDOMEN, AND PELVIS WITH CONTRAST TECHNIQUE: Multidetector CT imaging of the chest, abdomen and pelvis was performed following the standard protocol during bolus administration of intravenous contrast. RADIATION DOSE REDUCTION: This exam was performed according to the departmental dose-optimization program which includes automated exposure control, adjustment of the mA and/or kV according to patient size and/or use of iterative reconstruction technique. CONTRAST:  40mL OMNIPAQUE  IOHEXOL  350 MG/ML SOLN COMPARISON:  CT 11/11/2023, 09/09/2023, PET CT 05/19/2023, chest CT 08/15/2023, 07/04/2023 FINDINGS: CT CHEST FINDINGS Cardiovascular: Moderate aortic atherosclerosis. No aneurysm. Normal cardiac size. Coronary vascular calcification. No pericardial effusion. Right-sided central venous port tip at the distal SVC. Mediastinum/Nodes: Patent trachea. No thyroid  mass. Pretracheal lymph node on series 3, image 25 measures 8 mm, previously 8 mm. Similar subcentimeter precarinal lymph node. Esophagus within normal limits. Lungs/Pleura: Advanced emphysema. Spiculated right lower lobe pulmonary mass measures about 3 x 2.6 cm on series 4, image 133, previously 33 x 28 mm. Mucoid impaction within slightly dilated right lower lobe bronchi proximal to the mass. Previously noted medial right lower lobe subpleural focus of consolidation is largely resolved, however interval multifocal bilateral heterogeneous consolidations within the bilateral upper  lobes, subpleural right middle lobe and the right lower lobe. Right lower lobe lung mass is contiguous with consolidative process posteriorly that extends to the pleural surface. Left lower lobe subpleural nodularity measuring about 6 mm on series 4, image 126, previously 6 mm. Subpleural left upper lobe nodularity measures about 14 x 7 mm on series 4, image 43, previously 13 x 7 mm. Right apical scarring. No pleural effusion or pneumothorax. New left upper lobe 7 mm ground-glass nodule, series 4, image 103. Possible small spiculated 6 mm focus series 4 image 103. Musculoskeletal: No acute or suspicious osseous abnormality. CT ABDOMEN PELVIS FINDINGS Hepatobiliary: Gallstone. No focal hepatic abnormality. No biliary dilatation. Pancreas: Unremarkable. No pancreatic ductal dilatation or surrounding inflammatory changes. Spleen: Normal in size without focal abnormality. Adrenals/Urinary Tract: Adrenal glands are within normal limits. Kidneys show no hydronephrosis. Limited assessment of the collecting systems due to excreted contrast. The bladder is unremarkable aside from dilute contrast. Stomach/Bowel: Stomach nonenlarged. No dilated small bowel. No acute bowel wall thickening. Vascular/Lymphatic: Advanced calcific aortoiliac disease. No aneurysm. No suspicious lymph nodes. Reproductive: Hysterectomy.  No adnexal mass Other: Negative for pelvic effusion or free air Musculoskeletal: Left hip replacement. AVN of the right femoral head without collapse. No acute osseous abnormality IMPRESSION: 1. Advanced emphysema. Spiculated right lower lobe pulmonary mass measures  about 3 x 2.6 cm, stable to slightly diminished, previously 33 x 28 mm. Previously noted medial right lower lobe subpleural focus of consolidation is largely resolved, however interval multifocal bilateral heterogeneous consolidations within the bilateral upper lobes, subpleural right middle lobe and the right lower lobe. Few new ground-glass pulmonary  nodules, most evident in the left upper lobe. Findings are favored to be infectious or inflammatory in etiology but follow-up chest CT recommended as metastatic nodules could potentially be obscured by consolidative airspace disease and for follow-up of the new small left upper lobe pulmonary nodules which are indeterminate in origin. 2. No CT evidence for metastatic disease within the abdomen or pelvis. 3. Gallstone. Aortic Atherosclerosis (ICD10-I70.0) and Emphysema (ICD10-J43.9). Electronically Signed   By: Luke Bun M.D.   On: 01/25/2024 20:02   MR Brain W and Wo Contrast Result Date: 01/25/2024 CLINICAL DATA:  Brain/CNS neoplasm, assess treatment response. Multiple recent falls. EXAM: MRI HEAD WITHOUT AND WITH CONTRAST TECHNIQUE: Multiplanar, multiecho pulse sequences of the brain and surrounding structures were obtained without and with intravenous contrast. CONTRAST:  5mL GADAVIST  GADOBUTROL  1 MMOL/ML IV SOLN COMPARISON:  Head CT 01/25/2024 and MRI 05/13/2023 FINDINGS: Brain: As seen on today's earlier head CT, there are bilateral parietal masses with the left being hemorrhagic. Both lesions demonstrate irregular peripheral enhancement and measure 1.6 x 1.5 cm on the right and 3.5 x 2.7 cm on the left. There is associated moderately extensive vasogenic edema in the left greater than right posterior cerebral hemispheres with mild mass effect on the left lateral ventricle. A small subdural hematoma over the left lateral cerebral convexity measuring up to 5 mm in thickness is unchanged from today's CT. There is no significant midline shift. Small T2 hyperintensities elsewhere in the cerebral white matter and pons are nonspecific but compatible with mild chronic small vessel ischemic disease with changes in the pons having progressed from the prior MRI. No acute infarct or hydrocephalus is evident. There is mild cerebral atrophy. Vascular: Major intracranial vascular flow voids are preserved. Skull and  upper cervical spine: No suspicious marrow lesion. Sinuses/Orbits: Bilateral cataract extraction. Paranasal sinuses and mastoid air cells are clear. Other: Left-sided scalp soft tissue swelling. IMPRESSION: 1. Bilateral parietal masses most concerning for metastatic disease. Moderately extensive vasogenic edema without midline shift. 2. Unchanged small left cerebral convexity subdural hematoma. 3. Mild chronic small vessel ischemic disease. Electronically Signed   By: Dasie Hamburg M.D.   On: 01/25/2024 18:53   CT Cervical Spine Wo Contrast Result Date: 01/25/2024 CLINICAL DATA:  Neck trauma (Age >= 65y) EXAM: CT CERVICAL SPINE WITHOUT CONTRAST TECHNIQUE: Multidetector CT imaging of the cervical spine was performed without intravenous contrast. Multiplanar CT image reconstructions were also generated. RADIATION DOSE REDUCTION: This exam was performed according to the departmental dose-optimization program which includes automated exposure control, adjustment of the mA and/or kV according to patient size and/or use of iterative reconstruction technique. COMPARISON:  None Available. FINDINGS: Alignment: No subluxation. Slight degenerative anterolisthesis of C3 on C4 and retrolisthesis of C5 on C6. Skull base and vertebrae: No acute fracture. No primary bone lesion or focal pathologic process. Soft tissues and spinal canal: No prevertebral fluid or swelling. No visible canal hematoma. Disc levels: Moderate degenerative disc disease in the lower cervical spine. Advanced bilateral degenerative facet disease diffusely. Multilevel bilateral neural foraminal narrowing. Focal disc herniation centrally at C5-6 causing central spinal stenosis. Upper chest: Biapical scarring.  Emphysema. Other: None IMPRESSION: Degenerative disc and facet disease as described above.  Central disc herniation at C5-6 with central spinal stenosis. No acute bony abnormality. Electronically Signed   By: Franky Crease M.D.   On: 01/25/2024 15:33    CT Head Wo Contrast Result Date: 01/25/2024 CLINICAL DATA:  Head trauma, moderate-severe EXAM: CT HEAD WITHOUT CONTRAST TECHNIQUE: Contiguous axial images were obtained from the base of the skull through the vertex without intravenous contrast. RADIATION DOSE REDUCTION: This exam was performed according to the departmental dose-optimization program which includes automated exposure control, adjustment of the mA and/or kV according to patient size and/or use of iterative reconstruction technique. COMPARISON:  None Available. FINDINGS: Brain: There appear to be bilateral posterior parietal mass lesions, measuring approximately 2.7 cm on the left with areas of hemorrhage noted, and measuring approximately 1.3 cm on the right. Surrounding vasogenic edema in both parietal lobes. Also noted is a small left subdural hematoma measuring 4-5 mm in thickness. No significant mass effect or midline shift. No hydrocephalus. Vascular: No hyperdense vessel or unexpected calcification. Skull: No acute calvarial abnormality. Sinuses/Orbits: No acute findings Other: None IMPRESSION: Concern for bilateral posterior parietal mass lesions, left larger than right. Associated areas of hemorrhage within and surrounding the left posterior parietal mass lesion along with 4-5 mm left subdural hematoma. Associated vasogenic edema throughout the parietal lobes bilaterally. Critical Value/emergent results were called by telephone at the time of interpretation on 01/25/2024 at 3:29 pm to provider WHITNEY PLUNKETT , who verbally acknowledged these results. Electronically Signed   By: Franky Crease M.D.   On: 01/25/2024 15:29   DG Chest 2 View Result Date: 01/25/2024 CLINICAL DATA:  Recurrent falls. EXAM: CHEST - 2 VIEW COMPARISON:  11/13/2022. FINDINGS: Bilateral lungs appear hyperexpanded and hyperlucent with coarse bronchovascular markings, in keeping with COPD. Bilateral lungs otherwise appear clear. No dense consolidation or lung  collapse. Bilateral costophrenic angles are clear. Normal cardio-mediastinal silhouette. No acute osseous abnormalities. The soft tissues are within normal limits. Right-sided CT Port-A-Cath is seen with its tip overlying the cavoatrial junction region. IMPRESSION: No active cardiopulmonary disease. COPD. Electronically Signed   By: Ree Molt M.D.   On: 01/25/2024 13:22     TODAY-DAY OF DISCHARGE:  Subjective:   Kristy Salazar today has no headache,no chest abdominal pain,no new weakness tingling or numbness, feels much better wants to go home today.   Objective:   Blood pressure 129/85, pulse 77, temperature 98.4 F (36.9 C), temperature source Oral, resp. rate (!) 21, height 5' 11 (1.803 m), weight 44.7 kg, SpO2 94%.  Intake/Output Summary (Last 24 hours) at 01/30/2024 1102 Last data filed at 01/29/2024 2330 Gross per 24 hour  Intake --  Output 500 ml  Net -500 ml   Filed Weights   01/25/24 1352  Weight: 44.7 kg    Exam: Awake Alert, Oriented *3, No new F.N deficits, Normal affect [place].AT,PERRAL Supple Neck,No JVD, No cervical lymphadenopathy appriciated.  Symmetrical Chest wall movement, Good air movement bilaterally, CTAB RRR,No Gallops,Rubs or new Murmurs, No Parasternal Heave +ve B.Sounds, Abd Soft, Non tender, No organomegaly appriciated, No rebound -guarding or rigidity. No Cyanosis, Clubbing or edema, No new Rash or bruise   PERTINENT RADIOLOGIC STUDIES: No results found.   PERTINENT LAB RESULTS: CBC: Recent Labs    01/29/24 0324 01/30/24 0448  WBC 14.0* 16.1*  HGB 11.4* 12.3  HCT 35.2* 37.5  PLT 379 437*   CMET CMP     Component Value Date/Time   NA 138 01/30/2024 0448   K 3.9 01/30/2024 0448   CL  104 01/30/2024 0448   CO2 23 01/30/2024 0448   GLUCOSE 143 (H) 01/30/2024 0448   BUN 15 01/30/2024 0448   CREATININE 0.62 01/30/2024 0448   CREATININE 0.57 01/18/2024 0909   CALCIUM  8.5 (L) 01/30/2024 0448   PROT 7.8 01/26/2024 0530   ALBUMIN 2.8  (L) 01/26/2024 0530   AST 32 01/26/2024 0530   AST 21 01/18/2024 0909   ALT 31 01/26/2024 0530   ALT 24 01/18/2024 0909   ALKPHOS 107 01/26/2024 0530   BILITOT 0.7 01/26/2024 0530   BILITOT 0.5 01/18/2024 0909   GFRNONAA >60 01/30/2024 0448   GFRNONAA >60 01/18/2024 0909    GFR Estimated Creatinine Clearance: 39.6 mL/min (by C-G formula based on SCr of 0.62 mg/dL). No results for input(s): LIPASE, AMYLASE in the last 72 hours. No results for input(s): CKTOTAL, CKMB, CKMBINDEX, TROPONINI in the last 72 hours. Invalid input(s): POCBNP No results for input(s): DDIMER in the last 72 hours. No results for input(s): HGBA1C in the last 72 hours. No results for input(s): CHOL, HDL, LDLCALC, TRIG, CHOLHDL, LDLDIRECT in the last 72 hours. No results for input(s): TSH, T4TOTAL, T3FREE, THYROIDAB in the last 72 hours.  Invalid input(s): FREET3 No results for input(s): VITAMINB12, FOLATE, FERRITIN, TIBC, IRON, RETICCTPCT in the last 72 hours. Coags: No results for input(s): INR in the last 72 hours.  Invalid input(s): PT Microbiology: No results found for this or any previous visit (from the past 240 hours).  FURTHER DISCHARGE INSTRUCTIONS:  Get Medicines reviewed and adjusted: Please take all your medications with you for your next visit with your Primary MD  Laboratory/radiological data: Please request your Primary MD to go over all hospital tests and procedure/radiological results at the follow up, please ask your Primary MD to get all Hospital records sent to his/her office.  In some cases, they will be blood work, cultures and biopsy results pending at the time of your discharge. Please request that your primary care M.D. goes through all the records of your hospital data and follows up on these results.  Also Note the following: If you experience worsening of your admission symptoms, develop shortness of breath, life  threatening emergency, suicidal or homicidal thoughts you must seek medical attention immediately by calling 911 or calling your MD immediately  if symptoms less severe.  You must read complete instructions/literature along with all the possible adverse reactions/side effects for all the Medicines you take and that have been prescribed to you. Take any new Medicines after you have completely understood and accpet all the possible adverse reactions/side effects.   Do not drive when taking Pain medications or sleeping medications (Benzodaizepines)  Do not take more than prescribed Pain, Sleep and Anxiety Medications. It is not advisable to combine anxiety,sleep and pain medications without talking with your primary care practitioner  Special Instructions: If you have smoked or chewed Tobacco  in the last 2 yrs please stop smoking, stop any regular Alcohol  and or any Recreational drug use.  Wear Seat belts while driving.  Please note: You were cared for by a hospitalist during your hospital stay. Once you are discharged, your primary care physician will handle any further medical issues. Please note that NO REFILLS for any discharge medications will be authorized once you are discharged, as it is imperative that you return to your primary care physician (or establish a relationship with a primary care physician if you do not have one) for your post hospital discharge needs so that they can  reassess your need for medications and monitor your lab values.  Total Time spent coordinating discharge including counseling, education and face to face time equals greater than 30 minutes.  Signed: Chevette Salazar 01/30/2024 11:02 AM

## 2024-01-30 NOTE — Progress Notes (Signed)
 The patient and her daughter were seen today in the simulation department. We reviewed the results of her 3T MRI scan and the rationale including risks, benefits, short, and long term effects of stereotactic radiosurgery [facility]). Dr. Dewey has recommended 3 fractions to the two lesions in the parietal lobes, and we have been in contact with Dr. Cabbell as well to see if he thinks she would also benefit from surgical resection following radiation. We will plan to start her therapy this Thursday.

## 2024-01-30 NOTE — Progress Notes (Signed)
 AVS and discharge instructions completed with daughter.  PIV removed.  All belongings with patient and family.  Daughter has TOC medications at bedside.  IV consult placed for Port deaccess. Currently waiting for IV team.  Tele dept called.

## 2024-01-31 ENCOUNTER — Telehealth: Payer: Self-pay

## 2024-01-31 DIAGNOSIS — Z556 Problems related to health literacy: Secondary | ICD-10-CM | POA: Diagnosis not present

## 2024-01-31 DIAGNOSIS — Z681 Body mass index (BMI) 19 or less, adult: Secondary | ICD-10-CM | POA: Diagnosis not present

## 2024-01-31 DIAGNOSIS — Z9181 History of falling: Secondary | ICD-10-CM | POA: Diagnosis not present

## 2024-01-31 DIAGNOSIS — L89152 Pressure ulcer of sacral region, stage 2: Secondary | ICD-10-CM | POA: Diagnosis not present

## 2024-01-31 DIAGNOSIS — Z7951 Long term (current) use of inhaled steroids: Secondary | ICD-10-CM | POA: Diagnosis not present

## 2024-01-31 DIAGNOSIS — C3490 Malignant neoplasm of unspecified part of unspecified bronchus or lung: Secondary | ICD-10-CM | POA: Diagnosis not present

## 2024-01-31 DIAGNOSIS — J449 Chronic obstructive pulmonary disease, unspecified: Secondary | ICD-10-CM | POA: Diagnosis not present

## 2024-01-31 DIAGNOSIS — R634 Abnormal weight loss: Secondary | ICD-10-CM | POA: Diagnosis not present

## 2024-01-31 DIAGNOSIS — K2100 Gastro-esophageal reflux disease with esophagitis, without bleeding: Secondary | ICD-10-CM | POA: Diagnosis not present

## 2024-01-31 NOTE — Transitions of Care (Post Inpatient/ED Visit) (Signed)
   01/31/2024  Name: Kristy Salazar MRN: [id number] DOB: May 22, 1941  Today's TOC FU Call Status: Today's TOC FU Call Status:: Unsuccessful Call (1st Attempt) Unsuccessful Call (1st Attempt) Date: 01/31/24  Attempted to reach the patient regarding the most recent Inpatient/ED visit.  Voicemail was full and could not accept messages per automated voicemail.  Follow Up Plan: Additional outreach attempts will be made to reach the patient to complete the Transitions of Care (Post Inpatient/ED visit) call.   Richerd Fish, RN, BSN, CCM [facility], [facility] RN Care Manager Direct Dial: [phone]

## 2024-02-01 ENCOUNTER — Telehealth: Payer: Self-pay

## 2024-02-01 DIAGNOSIS — Z681 Body mass index (BMI) 19 or less, adult: Secondary | ICD-10-CM | POA: Diagnosis not present

## 2024-02-01 DIAGNOSIS — K2100 Gastro-esophageal reflux disease with esophagitis, without bleeding: Secondary | ICD-10-CM | POA: Diagnosis not present

## 2024-02-01 DIAGNOSIS — R059 Cough, unspecified: Secondary | ICD-10-CM | POA: Diagnosis not present

## 2024-02-01 DIAGNOSIS — J449 Chronic obstructive pulmonary disease, unspecified: Secondary | ICD-10-CM | POA: Diagnosis not present

## 2024-02-01 DIAGNOSIS — Z7951 Long term (current) use of inhaled steroids: Secondary | ICD-10-CM | POA: Diagnosis not present

## 2024-02-01 DIAGNOSIS — C7931 Secondary malignant neoplasm of brain: Secondary | ICD-10-CM | POA: Diagnosis not present

## 2024-02-01 DIAGNOSIS — Z79620 Long term (current) use of immunosuppressive biologic: Secondary | ICD-10-CM | POA: Diagnosis not present

## 2024-02-01 DIAGNOSIS — Z510 Encounter for antineoplastic radiation therapy: Secondary | ICD-10-CM | POA: Diagnosis not present

## 2024-02-01 DIAGNOSIS — F17210 Nicotine dependence, cigarettes, uncomplicated: Secondary | ICD-10-CM | POA: Diagnosis not present

## 2024-02-01 DIAGNOSIS — L89152 Pressure ulcer of sacral region, stage 2: Secondary | ICD-10-CM | POA: Diagnosis not present

## 2024-02-01 DIAGNOSIS — Z9181 History of falling: Secondary | ICD-10-CM | POA: Diagnosis not present

## 2024-02-01 DIAGNOSIS — C3490 Malignant neoplasm of unspecified part of unspecified bronchus or lung: Secondary | ICD-10-CM | POA: Diagnosis not present

## 2024-02-01 DIAGNOSIS — Z556 Problems related to health literacy: Secondary | ICD-10-CM | POA: Diagnosis not present

## 2024-02-01 DIAGNOSIS — C3431 Malignant neoplasm of lower lobe, right bronchus or lung: Secondary | ICD-10-CM | POA: Diagnosis not present

## 2024-02-01 DIAGNOSIS — Z5112 Encounter for antineoplastic immunotherapy: Secondary | ICD-10-CM | POA: Diagnosis not present

## 2024-02-01 DIAGNOSIS — R634 Abnormal weight loss: Secondary | ICD-10-CM | POA: Diagnosis not present

## 2024-02-01 NOTE — Transitions of Care (Post Inpatient/ED Visit) (Signed)
   02/01/2024  Name: Kristy Salazar MRN: [id number] DOB: [birth year]  Today's TOC FU Call Status: Today's TOC FU Call Status:: Unsuccessful Call (2nd Attempt) Unsuccessful Call (2nd Attempt) Date: 02/01/24  Attempted to reach the patient regarding the most recent Inpatient/ED visit. Unable to leave a voicemail message, mailbox is full and able to accept messages  Follow Up Plan: Additional outreach attempts will be made to reach the patient to complete the Transitions of Care (Post Inpatient/ED visit) call.   Richerd Fish, RN, BSN, CCM [facility], [facility] RN Care Manager Direct Dial: [phone]

## 2024-02-02 ENCOUNTER — Telehealth: Payer: Self-pay

## 2024-02-02 ENCOUNTER — Ambulatory Visit
Admission: RE | Admit: 2024-02-02 | Discharge: 2024-02-02 | Disposition: A | Discharge status: Home / Self Care | Source: Ambulatory Visit | Attending: Radiation Oncology | Admitting: Radiation Oncology

## 2024-02-02 ENCOUNTER — Other Ambulatory Visit: Payer: Self-pay

## 2024-02-02 DIAGNOSIS — Z510 Encounter for antineoplastic radiation therapy: Secondary | ICD-10-CM | POA: Diagnosis not present

## 2024-02-02 DIAGNOSIS — Z79620 Long term (current) use of immunosuppressive biologic: Secondary | ICD-10-CM | POA: Diagnosis not present

## 2024-02-02 DIAGNOSIS — Z5112 Encounter for antineoplastic immunotherapy: Secondary | ICD-10-CM | POA: Diagnosis not present

## 2024-02-02 DIAGNOSIS — C3431 Malignant neoplasm of lower lobe, right bronchus or lung: Secondary | ICD-10-CM | POA: Diagnosis not present

## 2024-02-02 DIAGNOSIS — C7931 Secondary malignant neoplasm of brain: Secondary | ICD-10-CM | POA: Diagnosis not present

## 2024-02-02 DIAGNOSIS — R059 Cough, unspecified: Secondary | ICD-10-CM | POA: Diagnosis not present

## 2024-02-02 LAB — RAD ONC ARIA SESSION SUMMARY
Course Elapsed Days: 0
Plan Fractions Treated to Date: 1
Plan Prescribed Dose Per Fraction: 9 Gy
Plan Total Fractions Prescribed: 3
Plan Total Prescribed Dose: 27 Gy
Reference Point Dosage Given to Date: 9 Gy
Reference Point Session Dosage Given: 9 Gy
Session Number: 1

## 2024-02-02 NOTE — Progress Notes (Signed)
 Kristy Salazar rested with us  for 15 minutes following her SRS treatment.  Patient denies headache, dizziness, nausea, diplopia or ringing in the ears. Denies fatigue. Patient without complaints. Understands to avoid strenuous activity for the next 24 hours and call [phone] with needs.   BP 134/80 (BP Location: Left Arm)   Pulse 71   Temp 98.7 F (37.1 C) (Oral)   Resp 16   SpO2 100%

## 2024-02-02 NOTE — Transitions of Care (Post Inpatient/ED Visit) (Signed)
   02/02/2024  Name: Kristy Salazar MRN: [id number] DOB: 10-19-1940  Today's TOC FU Call Status: Today's TOC FU Call Status:: Unsuccessful Call (3rd Attempt) Unsuccessful Call (3rd Attempt) Date: 02/02/24  Attempted to reach the patient regarding the most recent Inpatient/ED visit. Unable to leave voicemail message at both numbers provided per DPR.  Follow Up Plan: No further outreach attempts will be made at this time. We have been unable to contact the patient.  Richerd Fish, RN, BSN, CCM [facility], [facility] RN Care Manager Direct Dial: [phone]

## 2024-02-06 NOTE — Assessment & Plan Note (Signed)
 Stage IIIb (T4, N2, M0) non-small cell lung cancer, squamous cell carcinoma.  She presented with a large right lower lobe lung mass in addition to other right upper lobe pulmonary nodules and right hilar mediastinal lymphadenopathy.  She was diagnosed in September 2024.    PDL1 expression 1% per navigator.   She completed a course of concurrent chemoradiation with weekly carboplatin  for an AUC of 2 paclitaxel  45 mg/m. She started on 06/06/23. She is status post 7 cycles.  Her last day of treatment was on 07/18/2023.   She is currently undergoing treatment with consolidation immunotherapy with Imfinzi  1500 mg IV every 4 weeks. She is status post 5 cycles. First dose on 08/30/23.  He did not have any appreciable side effects from treatment.   Labs were reviewed.  Cycle 7 Imfinzi  scheduled for 02/21/2024.

## 2024-02-06 NOTE — Progress Notes (Unsigned)
 Patient Care Team: Benjamine Aland, MD as PCP - General (Family Medicine)  Clinic Day:  02/06/2024  Referring physician: Sherrod Sherrod, MD  ASSESSMENT & PLAN:   Assessment & Plan: Primary squamous cell carcinoma of bronchus of right lower lobe (HCC) Stage IIIb (T4, N2, M0) non-small cell lung cancer, squamous cell carcinoma.  She presented with a large right lower lobe lung mass in addition to other right upper lobe pulmonary nodules and right hilar mediastinal lymphadenopathy.  She was diagnosed in September 2024.    PDL1 expression 1% per navigator.   She completed a course of concurrent chemoradiation with weekly carboplatin  for an AUC of 2 paclitaxel  45 mg/m. She started on 06/06/23. She is status post 7 cycles.  Her last day of treatment was on 07/18/2023.   She is currently undergoing treatment with consolidation immunotherapy with Imfinzi  1500 mg IV every 4 weeks. She is status post 5 cycles. First dose on 08/30/23.  He did not have any appreciable side effects from treatment.   Labs were reviewed.  Cycle 7 Imfinzi  scheduled for 02/21/2024.      The patient understands the plans discussed today and is in agreement with them.  She knows to contact our office if she develops concerns prior to her next appointment.  I provided *** minutes of face-to-face time during this encounter and > 50% was spent counseling as documented under my assessment and plan.    Powell FORBES Lessen, NP  [facility] - A DEPT OF [facility] [address] [place] [address] Dept: [phone] Dept Fax: [phone]   No orders of the defined types were placed in this encounter.     CHIEF COMPLAINT:  CC: Squamous cell carcinoma of bronchus of right lower lobe  Current Treatment: Consolidation with Imfinzi  1500 mg IV every 4 weeks 5 cycles.  First dose on 08/29/2023  INTERVAL HISTORY:  Kristy Salazar is here today for repeat clinical assessment.  She denies fevers or chills. She denies pain. Her appetite is good. Her weight {Weight change:10426}.  I have reviewed the past medical history, past surgical history, social history and family history with the patient and they are unchanged from previous note.  ALLERGIES:  is allergic to erythromycin.  MEDICATIONS:  Current Outpatient Medications  Medication Sig Dispense Refill   dexamethasone  (DECADRON ) 4 MG tablet Take 1.5 tablets (6 mg total) by mouth 4 (four) times daily. 120 tablet 0   FARXIGA 10 MG TABS tablet Take 10 mg by mouth daily. (Patient not taking: Reported on 01/25/2024)     Fluticasone-Umeclidin-Vilant 100-62.5-25 MCG/INH AEPB Inhale 2 application  into the lungs daily as needed.     HYDROcodone -acetaminophen  (NORCO/VICODIN) 5-325 MG tablet Take 1 tablet by mouth every 6 (six) hours as needed for moderate pain (pain score 4-6). 15 tablet 0   montelukast  (SINGULAIR ) 10 MG tablet Take 10 mg by mouth daily. For seasonal allergies     Multiple Vitamin (MULTIVITAMIN WITH MINERALS) TABS tablet Take 1 tablet by mouth daily.     pantoprazole  (PROTONIX ) 40 MG tablet Take 1 tablet (40 mg total) by mouth daily. 30 tablet 0   rosuvastatin  (CRESTOR ) 10 MG tablet Take 10 mg by mouth daily.       sucralfate  (CARAFATE ) 1 GM/10ML suspension Take 1 g by mouth 4 (four) times daily -  with meals and at bedtime.     tiotropium (SPIRIVA ) 18 MCG inhalation capsule Place 18 mcg into  inhaler and inhale daily.     No current facility-administered medications for this visit.    HISTORY OF PRESENT ILLNESS:   Oncology History  Primary squamous cell carcinoma of bronchus of right lower lobe (HCC)  05/25/2023 Initial Diagnosis   Primary squamous cell carcinoma of bronchus of right lower lobe (HCC)   05/25/2023 Cancer Staging   Staging form: Lung, AJCC 8th Edition - Clinical: Stage IIIB (cT3, cN2, cM0) - Signed by Sherrod Sherrod, MD on 05/25/2023   06/06/2023 - 07/18/2023 Chemotherapy   Patient  is on Treatment Plan : LUNG Carboplatin  + Paclitaxel  + XRT q7d     08/30/2023 -  Chemotherapy   Patient is on Treatment Plan : LUNG NSCLC Durvalumab  (1500) q28d     01/27/2024 Imaging   MR brain without contrast  IMPRESSION: 1. Re-demonstrated biparietal metastases with stable vasogenic edema. No new metastatic disease identified. 2. Stable small left side Subdural Hematoma (2-3 mm). Mild associated dural thickening and enhancement. No midline shift. 3. No new intracranial abnormality.       REVIEW OF SYSTEMS:   Constitutional: Denies fevers, chills or abnormal weight loss Eyes: Denies blurriness of vision Ears, nose, mouth, throat, and face: Denies mucositis or sore throat Respiratory: Denies cough, dyspnea or wheezes Cardiovascular: Denies palpitation, chest discomfort or lower extremity swelling Gastrointestinal:  Denies nausea, heartburn or change in bowel habits Skin: Denies abnormal skin rashes Lymphatics: Denies new lymphadenopathy or easy bruising Neurological:Denies numbness, tingling or new weaknesses Behavioral/Psych: Mood is stable, no new changes  All other systems were reviewed with the patient and are negative.   VITALS:  There were no vitals taken for this visit.  Wt Readings from Last 3 Encounters:  01/25/24 98 lb 8 oz (44.7 kg)  01/18/24 98 lb 8 oz (44.7 kg)  12/20/23 87 lb 12.8 oz (39.8 kg)    There is no height or weight on file to calculate BMI.  Performance status (ECOG): {CHL ONC [id number]  PHYSICAL EXAM:   GENERAL:alert, no distress and comfortable SKIN: skin color, texture, turgor are normal, no rashes or significant lesions EYES: normal, Conjunctiva are pink and non-injected, sclera clear OROPHARYNX:no exudate, no erythema and lips, buccal mucosa, and tongue normal  NECK: supple, thyroid  normal size, non-tender, without nodularity LYMPH:  no palpable lymphadenopathy in the cervical, axillary or inguinal LUNGS: clear to auscultation and  percussion with normal breathing effort HEART: regular rate & rhythm and no murmurs and no lower extremity edema ABDOMEN:abdomen soft, non-tender and normal bowel sounds Musculoskeletal:no cyanosis of digits and no clubbing  NEURO: alert & oriented x 3 with fluent speech, no focal motor/sensory deficits  LABORATORY DATA:  I have reviewed the data as listed    Component Value Date/Time   NA 138 01/30/2024 0448   K 3.9 01/30/2024 0448   CL 104 01/30/2024 0448   CO2 23 01/30/2024 0448   GLUCOSE 143 (H) 01/30/2024 0448   BUN 15 01/30/2024 0448   CREATININE 0.62 01/30/2024 0448   CREATININE 0.57 01/18/2024 0909   CALCIUM  8.5 (L) 01/30/2024 0448   PROT 7.8 01/26/2024 0530   ALBUMIN 2.8 (L) 01/26/2024 0530   AST 32 01/26/2024 0530   AST 21 01/18/2024 0909   ALT 31 01/26/2024 0530   ALT 24 01/18/2024 0909   ALKPHOS 107 01/26/2024 0530   BILITOT 0.7 01/26/2024 0530   BILITOT 0.5 01/18/2024 0909   GFRNONAA >60 01/30/2024 0448   GFRNONAA >60 01/18/2024 0909   GFRAA >90 05/10/2011 1000  No results found for: SPEP, UPEP  Lab Results  Component Value Date   WBC 16.1 (H) 01/30/2024   NEUTROABS 7.0 01/25/2024   HGB 12.3 01/30/2024   HCT 37.5 01/30/2024   MCV 96.9 01/30/2024   PLT 437 (H) 01/30/2024      Chemistry      Component Value Date/Time   NA 138 01/30/2024 0448   K 3.9 01/30/2024 0448   CL 104 01/30/2024 0448   CO2 23 01/30/2024 0448   BUN 15 01/30/2024 0448   CREATININE 0.62 01/30/2024 0448   CREATININE 0.57 01/18/2024 0909      Component Value Date/Time   CALCIUM  8.5 (L) 01/30/2024 0448   ALKPHOS 107 01/26/2024 0530   AST 32 01/26/2024 0530   AST 21 01/18/2024 0909   ALT 31 01/26/2024 0530   ALT 24 01/18/2024 0909   BILITOT 0.7 01/26/2024 0530   BILITOT 0.5 01/18/2024 0909       RADIOGRAPHIC STUDIES: I have personally reviewed the radiological images as listed and agreed with the findings in the report. MR BRAIN W WO CONTRAST Result Date:  01/27/2024 CLINICAL DATA:  83 year old female with lung cancer, biparietal brain metastases on recent MRI. 3 Tesla staging. Left side subdural hematoma. EXAM: MRI HEAD WITHOUT AND WITH CONTRAST TECHNIQUE: Multiplanar, multiecho pulse sequences of the brain and surrounding structures were obtained without and with intravenous contrast. CONTRAST:  4mL GADAVIST  GADOBUTROL  1 MMOL/ML IV SOLN COMPARISON:  Brain MRI 01/25/2024 and earlier. FINDINGS: Brain: Posterior left hemisphere parieto-occipital sulcus level rim enhancing mass is 3.5 cm long axis (series 1100, image 211). Abundant hemosiderin associated with this lesion. Smaller right inferior parietal lobe more solidly rim enhancing mass is 17 mm long axis (series 1100, image 235). Minimal hemosiderin associated. Smooth and mild dural thickening and enhancement associated with left-side SDH (see below), most apparent on coronal postcontrast. No other no other abnormal intracranial enhancement identified Posterior hemisphere vasogenic edema affecting both parietal lobes, left occipital lobe has not significantly changed. Stable mass effect on the posterolateral ventricles, more so the left. Superimposed small left side subdural hematoma is stable measuring 2-3 mm. No IVH or other acute intracranial hemorrhage. There is punctate chronic microhemorrhage just anterior to the right parietal metastasis, stable. Basilar cisterns remain normal. No ventriculomegaly. No restricted diffusion to suggest acute infarction. Cervicomedullary junction and pituitary are within normal limits. Stable gray and white matter signal elsewhere including patchy T2 and FLAIR hyperintensity in the pons. Vascular: Major intracranial vascular flow voids are stable. Skull and upper cervical spine: Visualized bone marrow signal is within normal limits. Negative for age visible cervical spine. Sinuses/Orbits: Stable, negative. Other: Mastoids well aerated. Visible internal auditory structures appear  normal. IMPRESSION: 1. Re-demonstrated biparietal metastases with stable vasogenic edema. No new metastatic disease identified. 2. Stable small left side Subdural Hematoma (2-3 mm). Mild associated dural thickening and enhancement. No midline shift. 3. No new intracranial abnormality. Electronically Signed   By: VEAR Hurst M.D.   On: 01/27/2024 04:12   CT HEAD WO CONTRAST ( ) Result Date: 01/26/2024 CLINICAL DATA:  83 year old female with abnormal head CT and brain MRI yesterday, multiple recent falls. Brain metastases suspected on MRI, with small left hemisphere subdural hematoma also. EXAM: CT HEAD WITHOUT CONTRAST TECHNIQUE: Contiguous axial images were obtained from the base of the skull through the vertex without intravenous contrast. RADIATION DOSE REDUCTION: This exam was performed according to the departmental dose-optimization program which includes automated exposure control, adjustment of the mA and/or kV according  to patient size and/or use of iterative reconstruction technique. COMPARISON:  Brain MRI and head CT yesterday. FINDINGS: Brain: Mixed density but mostly hyperdense left side subdural hematoma is 4 mm on series 2, image 16 and appears stable. No new intracranial hemorrhage is identified. But there are biparietal mass lesions redemonstrated, with mild hyperdense blood products associated with the larger left side lesion as before. That mass was up to 3.5 cm by MRI. Regional vasogenic edema in the posterior hemispheres appears stable from yesterday. Stable associated relatively mild intracranial mass effect, mass effect on the atria of the lateral ventricle and trace rightward midline shift. No ventriculomegaly. Basilar cisterns remain patent. No new cerebral edema identified. Vascular: Calcified atherosclerosis at the skull base. No suspicious intracranial vascular hyperdensity. Skull: No fracture identified. Sinuses/Orbits: Visualized paranasal sinuses and mastoids are stable and well  aerated. Other: Mild left posterosuperior scalp hematoma or contusion suspected on series 3, image 61. Underlying calvarium intact. No scalp soft tissue gas. Visualized orbit soft tissues are within normal limits. IMPRESSION: 1. Stable since yesterday: - 4 mm Left Side Subdural Hematoma. Overlying left scalp hematoma but no skull fracture identified. - heterogeneous brain metastases in both parietal lobes, larger on the left. Regional cytotoxic edema. 2. Stable mild intracranial mass effect, trace rightward midline shift. 3. No new intracranial abnormality. Electronically Signed   By: VEAR Hurst M.D.   On: 01/26/2024 07:16   CT CHEST ABDOMEN PELVIS W CONTRAST Result Date: 01/25/2024 CLINICAL DATA:  Lung cancer frequent fall EXAM: CT CHEST, ABDOMEN, AND PELVIS WITH CONTRAST TECHNIQUE: Multidetector CT imaging of the chest, abdomen and pelvis was performed following the standard protocol during bolus administration of intravenous contrast. RADIATION DOSE REDUCTION: This exam was performed according to the departmental dose-optimization program which includes automated exposure control, adjustment of the mA and/or kV according to patient size and/or use of iterative reconstruction technique. CONTRAST:  40mL OMNIPAQUE  IOHEXOL  350 MG/ML SOLN COMPARISON:  CT 11/11/2023, 09/09/2023, PET CT 05/19/2023, chest CT 08/15/2023, 07/04/2023 FINDINGS: CT CHEST FINDINGS Cardiovascular: Moderate aortic atherosclerosis. No aneurysm. Normal cardiac size. Coronary vascular calcification. No pericardial effusion. Right-sided central venous port tip at the distal SVC. Mediastinum/Nodes: Patent trachea. No thyroid  mass. Pretracheal lymph node on series 3, image 25 measures 8 mm, previously 8 mm. Similar subcentimeter precarinal lymph node. Esophagus within normal limits. Lungs/Pleura: Advanced emphysema. Spiculated right lower lobe pulmonary mass measures about 3 x 2.6 cm on series 4, image 133, previously 33 x 28 mm. Mucoid impaction  within slightly dilated right lower lobe bronchi proximal to the mass. Previously noted medial right lower lobe subpleural focus of consolidation is largely resolved, however interval multifocal bilateral heterogeneous consolidations within the bilateral upper lobes, subpleural right middle lobe and the right lower lobe. Right lower lobe lung mass is contiguous with consolidative process posteriorly that extends to the pleural surface. Left lower lobe subpleural nodularity measuring about 6 mm on series 4, image 126, previously 6 mm. Subpleural left upper lobe nodularity measures about 14 x 7 mm on series 4, image 43, previously 13 x 7 mm. Right apical scarring. No pleural effusion or pneumothorax. New left upper lobe 7 mm ground-glass nodule, series 4, image 103. Possible small spiculated 6 mm focus series 4 image 103. Musculoskeletal: No acute or suspicious osseous abnormality. CT ABDOMEN PELVIS FINDINGS Hepatobiliary: Gallstone. No focal hepatic abnormality. No biliary dilatation. Pancreas: Unremarkable. No pancreatic ductal dilatation or surrounding inflammatory changes. Spleen: Normal in size without focal abnormality. Adrenals/Urinary Tract: Adrenal glands are within  normal limits. Kidneys show no hydronephrosis. Limited assessment of the collecting systems due to excreted contrast. The bladder is unremarkable aside from dilute contrast. Stomach/Bowel: Stomach nonenlarged. No dilated small bowel. No acute bowel wall thickening. Vascular/Lymphatic: Advanced calcific aortoiliac disease. No aneurysm. No suspicious lymph nodes. Reproductive: Hysterectomy.  No adnexal mass Other: Negative for pelvic effusion or free air Musculoskeletal: Left hip replacement. AVN of the right femoral head without collapse. No acute osseous abnormality IMPRESSION: 1. Advanced emphysema. Spiculated right lower lobe pulmonary mass measures about 3 x 2.6 cm, stable to slightly diminished, previously 33 x 28 mm. Previously noted medial  right lower lobe subpleural focus of consolidation is largely resolved, however interval multifocal bilateral heterogeneous consolidations within the bilateral upper lobes, subpleural right middle lobe and the right lower lobe. Few new ground-glass pulmonary nodules, most evident in the left upper lobe. Findings are favored to be infectious or inflammatory in etiology but follow-up chest CT recommended as metastatic nodules could potentially be obscured by consolidative airspace disease and for follow-up of the new small left upper lobe pulmonary nodules which are indeterminate in origin. 2. No CT evidence for metastatic disease within the abdomen or pelvis. 3. Gallstone. Aortic Atherosclerosis (ICD10-I70.0) and Emphysema (ICD10-J43.9). Electronically Signed   By: Luke Bun M.D.   On: 01/25/2024 20:02   MR Brain W and Wo Contrast Result Date: 01/25/2024 CLINICAL DATA:  Brain/CNS neoplasm, assess treatment response. Multiple recent falls. EXAM: MRI HEAD WITHOUT AND WITH CONTRAST TECHNIQUE: Multiplanar, multiecho pulse sequences of the brain and surrounding structures were obtained without and with intravenous contrast. CONTRAST:  5mL GADAVIST  GADOBUTROL  1 MMOL/ML IV SOLN COMPARISON:  Head CT 01/25/2024 and MRI 05/13/2023 FINDINGS: Brain: As seen on today's earlier head CT, there are bilateral parietal masses with the left being hemorrhagic. Both lesions demonstrate irregular peripheral enhancement and measure 1.6 x 1.5 cm on the right and 3.5 x 2.7 cm on the left. There is associated moderately extensive vasogenic edema in the left greater than right posterior cerebral hemispheres with mild mass effect on the left lateral ventricle. A small subdural hematoma over the left lateral cerebral convexity measuring up to 5 mm in thickness is unchanged from today's CT. There is no significant midline shift. Small T2 hyperintensities elsewhere in the cerebral white matter and pons are nonspecific but compatible with  mild chronic small vessel ischemic disease with changes in the pons having progressed from the prior MRI. No acute infarct or hydrocephalus is evident. There is mild cerebral atrophy. Vascular: Major intracranial vascular flow voids are preserved. Skull and upper cervical spine: No suspicious marrow lesion. Sinuses/Orbits: Bilateral cataract extraction. Paranasal sinuses and mastoid air cells are clear. Other: Left-sided scalp soft tissue swelling. IMPRESSION: 1. Bilateral parietal masses most concerning for metastatic disease. Moderately extensive vasogenic edema without midline shift. 2. Unchanged small left cerebral convexity subdural hematoma. 3. Mild chronic small vessel ischemic disease. Electronically Signed   By: Dasie Hamburg M.D.   On: 01/25/2024 18:53   CT Cervical Spine Wo Contrast Result Date: 01/25/2024 CLINICAL DATA:  Neck trauma (Age >= 65y) EXAM: CT CERVICAL SPINE WITHOUT CONTRAST TECHNIQUE: Multidetector CT imaging of the cervical spine was performed without intravenous contrast. Multiplanar CT image reconstructions were also generated. RADIATION DOSE REDUCTION: This exam was performed according to the departmental dose-optimization program which includes automated exposure control, adjustment of the mA and/or kV according to patient size and/or use of iterative reconstruction technique. COMPARISON:  None Available. FINDINGS: Alignment: No subluxation. Slight degenerative anterolisthesis  of C3 on C4 and retrolisthesis of C5 on C6. Skull base and vertebrae: No acute fracture. No primary bone lesion or focal pathologic process. Soft tissues and spinal canal: No prevertebral fluid or swelling. No visible canal hematoma. Disc levels: Moderate degenerative disc disease in the lower cervical spine. Advanced bilateral degenerative facet disease diffusely. Multilevel bilateral neural foraminal narrowing. Focal disc herniation centrally at C5-6 causing central spinal stenosis. Upper chest: Biapical  scarring.  Emphysema. Other: None IMPRESSION: Degenerative disc and facet disease as described above. Central disc herniation at C5-6 with central spinal stenosis. No acute bony abnormality. Electronically Signed   By: Franky Crease M.D.   On: 01/25/2024 15:33   CT Head Wo Contrast Result Date: 01/25/2024 CLINICAL DATA:  Head trauma, moderate-severe EXAM: CT HEAD WITHOUT CONTRAST TECHNIQUE: Contiguous axial images were obtained from the base of the skull through the vertex without intravenous contrast. RADIATION DOSE REDUCTION: This exam was performed according to the departmental dose-optimization program which includes automated exposure control, adjustment of the mA and/or kV according to patient size and/or use of iterative reconstruction technique. COMPARISON:  None Available. FINDINGS: Brain: There appear to be bilateral posterior parietal mass lesions, measuring approximately 2.7 cm on the left with areas of hemorrhage noted, and measuring approximately 1.3 cm on the right. Surrounding vasogenic edema in both parietal lobes. Also noted is a small left subdural hematoma measuring 4-5 mm in thickness. No significant mass effect or midline shift. No hydrocephalus. Vascular: No hyperdense vessel or unexpected calcification. Skull: No acute calvarial abnormality. Sinuses/Orbits: No acute findings Other: None IMPRESSION: Concern for bilateral posterior parietal mass lesions, left larger than right. Associated areas of hemorrhage within and surrounding the left posterior parietal mass lesion along with 4-5 mm left subdural hematoma. Associated vasogenic edema throughout the parietal lobes bilaterally. Critical Value/emergent results were called by telephone at the time of interpretation on 01/25/2024 at 3:29 pm to provider WHITNEY PLUNKETT , who verbally acknowledged these results. Electronically Signed   By: Franky Crease M.D.   On: 01/25/2024 15:29   DG Chest 2 View Result Date: 01/25/2024 CLINICAL DATA:   Recurrent falls. EXAM: CHEST - 2 VIEW COMPARISON:  11/13/2022. FINDINGS: Bilateral lungs appear hyperexpanded and hyperlucent with coarse bronchovascular markings, in keeping with COPD. Bilateral lungs otherwise appear clear. No dense consolidation or lung collapse. Bilateral costophrenic angles are clear. Normal cardio-mediastinal silhouette. No acute osseous abnormalities. The soft tissues are within normal limits. Right-sided CT Port-A-Cath is seen with its tip overlying the cavoatrial junction region. IMPRESSION: No active cardiopulmonary disease. COPD. Electronically Signed   By: Ree Molt M.D.   On: 01/25/2024 13:22

## 2024-02-07 ENCOUNTER — Inpatient Hospital Stay: Payer: Self-pay | Admitting: Internal Medicine

## 2024-02-07 ENCOUNTER — Ambulatory Visit
Admission: RE | Admit: 2024-02-07 | Discharge: 2024-02-07 | Disposition: A | Discharge status: Home / Self Care | Source: Ambulatory Visit | Attending: Radiation Oncology | Admitting: Radiation Oncology

## 2024-02-07 ENCOUNTER — Inpatient Hospital Stay: Payer: Self-pay

## 2024-02-07 ENCOUNTER — Telehealth: Payer: Self-pay

## 2024-02-07 ENCOUNTER — Other Ambulatory Visit: Payer: Self-pay

## 2024-02-07 VITALS — BP 123/74 | HR 89 | Temp 98.7°F | Resp 18 | Ht 71.0 in | Wt 99.0 lb

## 2024-02-07 DIAGNOSIS — Z556 Problems related to health literacy: Secondary | ICD-10-CM | POA: Diagnosis not present

## 2024-02-07 DIAGNOSIS — Z7951 Long term (current) use of inhaled steroids: Secondary | ICD-10-CM | POA: Diagnosis not present

## 2024-02-07 DIAGNOSIS — Z79620 Long term (current) use of immunosuppressive biologic: Secondary | ICD-10-CM | POA: Diagnosis not present

## 2024-02-07 DIAGNOSIS — C3431 Malignant neoplasm of lower lobe, right bronchus or lung: Secondary | ICD-10-CM

## 2024-02-07 DIAGNOSIS — L89152 Pressure ulcer of sacral region, stage 2: Secondary | ICD-10-CM | POA: Diagnosis not present

## 2024-02-07 DIAGNOSIS — K2100 Gastro-esophageal reflux disease with esophagitis, without bleeding: Secondary | ICD-10-CM | POA: Diagnosis not present

## 2024-02-07 DIAGNOSIS — C7931 Secondary malignant neoplasm of brain: Secondary | ICD-10-CM | POA: Diagnosis not present

## 2024-02-07 DIAGNOSIS — R059 Cough, unspecified: Secondary | ICD-10-CM | POA: Diagnosis not present

## 2024-02-07 DIAGNOSIS — Z9181 History of falling: Secondary | ICD-10-CM | POA: Diagnosis not present

## 2024-02-07 DIAGNOSIS — Z5112 Encounter for antineoplastic immunotherapy: Secondary | ICD-10-CM | POA: Diagnosis not present

## 2024-02-07 DIAGNOSIS — R634 Abnormal weight loss: Secondary | ICD-10-CM | POA: Diagnosis not present

## 2024-02-07 DIAGNOSIS — Z681 Body mass index (BMI) 19 or less, adult: Secondary | ICD-10-CM | POA: Diagnosis not present

## 2024-02-07 DIAGNOSIS — J449 Chronic obstructive pulmonary disease, unspecified: Secondary | ICD-10-CM | POA: Diagnosis not present

## 2024-02-07 DIAGNOSIS — C3490 Malignant neoplasm of unspecified part of unspecified bronchus or lung: Secondary | ICD-10-CM | POA: Diagnosis not present

## 2024-02-07 DIAGNOSIS — Z95828 Presence of other vascular implants and grafts: Secondary | ICD-10-CM

## 2024-02-07 DIAGNOSIS — Z510 Encounter for antineoplastic radiation therapy: Secondary | ICD-10-CM | POA: Diagnosis not present

## 2024-02-07 LAB — CBC WITH DIFFERENTIAL (CANCER CENTER ONLY)
Abs Immature Granulocytes: 1.16 K/uL — ABNORMAL HIGH (ref 0.00–0.07)
Basophils Absolute: 0.1 K/uL (ref 0.0–0.1)
Basophils Relative: 1 %
Eosinophils Absolute: 0 K/uL (ref 0.0–0.5)
Eosinophils Relative: 0 %
HCT: 39 % (ref 36.0–46.0)
Hemoglobin: 13.1 g/dL (ref 12.0–15.0)
Immature Granulocytes: 7 %
Lymphocytes Relative: 2 %
Lymphs Abs: 0.4 K/uL — ABNORMAL LOW (ref 0.7–4.0)
MCH: 31.2 pg (ref 26.0–34.0)
MCHC: 33.6 g/dL (ref 30.0–36.0)
MCV: 92.9 fL (ref 80.0–100.0)
Monocytes Absolute: 0.8 K/uL (ref 0.1–1.0)
Monocytes Relative: 4 %
Neutro Abs: 15.1 K/uL — ABNORMAL HIGH (ref 1.7–7.7)
Neutrophils Relative %: 86 %
Platelet Count: 399 K/uL (ref 150–400)
RBC: 4.2 MIL/uL (ref 3.87–5.11)
RDW: 16.1 % — ABNORMAL HIGH (ref 11.5–15.5)
Smear Review: NORMAL
WBC Count: 17.5 K/uL — ABNORMAL HIGH (ref 4.0–10.5)
nRBC: 0 % (ref 0.0–0.2)

## 2024-02-07 LAB — CMP (CANCER CENTER ONLY)
ALT: 46 U/L — ABNORMAL HIGH (ref 0–44)
AST: 20 U/L (ref 15–41)
Albumin: 3.3 g/dL — ABNORMAL LOW (ref 3.5–5.0)
Alkaline Phosphatase: 76 U/L (ref 38–126)
Anion gap: 8 (ref 5–15)
BUN: 33 mg/dL — ABNORMAL HIGH (ref 8–23)
CO2: 27 mmol/L (ref 22–32)
Calcium: 8.6 mg/dL — ABNORMAL LOW (ref 8.9–10.3)
Chloride: 100 mmol/L (ref 98–111)
Creatinine: 0.6 mg/dL (ref 0.44–1.00)
GFR, Estimated: >60 mL/min (ref >60)
Glucose, Bld: 167 mg/dL — ABNORMAL HIGH (ref 70–99)
Potassium: 4 mmol/L (ref 3.5–5.1)
Sodium: 135 mmol/L (ref 135–145)
Total Bilirubin: 0.3 mg/dL (ref 0.0–1.2)
Total Protein: 6.7 g/dL (ref 6.5–8.1)

## 2024-02-07 LAB — RAD ONC ARIA SESSION SUMMARY
Course Elapsed Days: 5
Plan Fractions Treated to Date: 2
Plan Prescribed Dose Per Fraction: 9 Gy
Plan Total Fractions Prescribed: 3
Plan Total Prescribed Dose: 27 Gy
Reference Point Dosage Given to Date: 18 Gy
Reference Point Session Dosage Given: 9 Gy
Session Number: 2

## 2024-02-07 MED ORDER — SODIUM CHLORIDE 0.9% FLUSH
10.0000 mL | Freq: Once | INTRAVENOUS | Status: AC
  Administered 2024-02-07: 10 mL

## 2024-02-07 MED ORDER — HEPARIN SOD (PORK) LOCK FLUSH 100 UNIT/ML IV SOLN
250.0000 [IU] | Freq: Once | INTRAVENOUS | Status: AC
Start: 2024-02-07 — End: 2024-02-07
  Administered 2024-02-07: 250 [IU]

## 2024-02-07 NOTE — Progress Notes (Signed)
 Mrs. Kristy Salazar rested with us  for 15 minutes following her SRS treatment.  Patient denies headache, dizziness, nausea, diplopia or ringing in the ears. Denies fatigue. Patient without complaints. Understands to avoid strenuous activity for the next 24 hours and call [phone] with needs.   BP 129/76 (BP Location: Left Arm)   Pulse 73   Temp 97.8 F (36.6 C) (Temporal)   Resp 20   SpO2 92%

## 2024-02-07 NOTE — Telephone Encounter (Signed)
 Spoke with patient's daughter, [name], regarding the patient's appointments today for port flush with labs and follow-up with Dr. Sherrod. [name] stated that they were not aware of these appointments. Informed her to proceed to the cancer center for scheduled appointments. She voiced understanding.

## 2024-02-08 ENCOUNTER — Encounter: Payer: Self-pay | Admitting: Radiation Oncology

## 2024-02-08 NOTE — Progress Notes (Signed)
  Radiation Oncology         [phone] ________________________________  Name: Kristy Salazar  FMW:[id number]  Date of Service: 02/08/24  DOB: 07-14-1940   Steroid Taper Instructions   You currently have a prescription for Dexamethasone  4 mg Tablets.   Beginning 02/09/24  Take a 4 mg tablet three times a day  Beginning 02/13/24  Take a 4 mg tablet twice a day  Beginning 02/20/24: Take 1/2 of a tablet (which is 2 mg) twice a day  Beginning 02/27/24: Take 1/2 of a tablet (which is 2 mg) once a day  Beginning 03/05/24: Take 1/2 of a tablet (which is 2 mg) every other day and stop on 03/12/24.   Please call our office if you have any headaches, visual changes, uncontrolled movements, extremity weakness, nausea or vomiting.

## 2024-02-09 ENCOUNTER — Ambulatory Visit
Admission: RE | Admit: 2024-02-09 | Discharge: 2024-02-09 | Disposition: A | Discharge status: Home / Self Care | Source: Ambulatory Visit | Attending: Radiation Oncology | Admitting: Radiation Oncology

## 2024-02-09 ENCOUNTER — Other Ambulatory Visit: Payer: Self-pay

## 2024-02-09 DIAGNOSIS — Z5112 Encounter for antineoplastic immunotherapy: Secondary | ICD-10-CM | POA: Diagnosis not present

## 2024-02-09 DIAGNOSIS — R059 Cough, unspecified: Secondary | ICD-10-CM | POA: Diagnosis not present

## 2024-02-09 DIAGNOSIS — Z556 Problems related to health literacy: Secondary | ICD-10-CM | POA: Diagnosis not present

## 2024-02-09 DIAGNOSIS — C3431 Malignant neoplasm of lower lobe, right bronchus or lung: Secondary | ICD-10-CM | POA: Diagnosis not present

## 2024-02-09 DIAGNOSIS — R634 Abnormal weight loss: Secondary | ICD-10-CM | POA: Diagnosis not present

## 2024-02-09 DIAGNOSIS — Z510 Encounter for antineoplastic radiation therapy: Secondary | ICD-10-CM | POA: Diagnosis not present

## 2024-02-09 DIAGNOSIS — Z9181 History of falling: Secondary | ICD-10-CM | POA: Diagnosis not present

## 2024-02-09 DIAGNOSIS — F17210 Nicotine dependence, cigarettes, uncomplicated: Secondary | ICD-10-CM | POA: Diagnosis not present

## 2024-02-09 DIAGNOSIS — C3490 Malignant neoplasm of unspecified part of unspecified bronchus or lung: Secondary | ICD-10-CM | POA: Diagnosis not present

## 2024-02-09 DIAGNOSIS — Z7951 Long term (current) use of inhaled steroids: Secondary | ICD-10-CM | POA: Diagnosis not present

## 2024-02-09 DIAGNOSIS — K2100 Gastro-esophageal reflux disease with esophagitis, without bleeding: Secondary | ICD-10-CM | POA: Diagnosis not present

## 2024-02-09 DIAGNOSIS — J449 Chronic obstructive pulmonary disease, unspecified: Secondary | ICD-10-CM | POA: Diagnosis not present

## 2024-02-09 DIAGNOSIS — Z681 Body mass index (BMI) 19 or less, adult: Secondary | ICD-10-CM | POA: Diagnosis not present

## 2024-02-09 DIAGNOSIS — Z79620 Long term (current) use of immunosuppressive biologic: Secondary | ICD-10-CM | POA: Diagnosis not present

## 2024-02-09 DIAGNOSIS — L89152 Pressure ulcer of sacral region, stage 2: Secondary | ICD-10-CM | POA: Diagnosis not present

## 2024-02-09 DIAGNOSIS — C7931 Secondary malignant neoplasm of brain: Secondary | ICD-10-CM | POA: Diagnosis not present

## 2024-02-09 LAB — RAD ONC ARIA SESSION SUMMARY
Course Elapsed Days: 7
Plan Fractions Treated to Date: 3
Plan Prescribed Dose Per Fraction: 9 Gy
Plan Total Fractions Prescribed: 3
Plan Total Prescribed Dose: 27 Gy
Reference Point Dosage Given to Date: 27 Gy
Reference Point Session Dosage Given: 9 Gy
Session Number: 3

## 2024-02-09 NOTE — Progress Notes (Signed)
 Patient rested with us  for 15 minutes following his SRS treatment.  Patient denies headache, dizziness, nausea, diplopia or ringing in the ears. Denies fatigue. Patient without complaints. Understands to avoid strenuous activity for the next 24 hours and call [phone] with needs.    Patient was given steroid taper.   BP 123/84 (BP Location: Left Arm, Patient Position: Sitting, Cuff Size: Small)   Pulse (!) 101   Temp 97.7 F (36.5 C)   Resp 20   SpO2 100%

## 2024-02-10 ENCOUNTER — Ambulatory Visit (HOSPITAL_COMMUNITY): Attending: Physician Assistant

## 2024-02-10 NOTE — Radiation Completion Notes (Addendum)
  Radiation Oncology         [phone] ________________________________  Name: Kristy Salazar MRN: [id number]  Date of Service: 02/09/2024  DOB: 12-Jan-1941  End of Treatment Note  Diagnosis:  Progressive metastatic stage IIIB, cT4N2M0, NSCLC, squamous cell carcinoma of the right lower lobe now involving bilateral parietal lobes   Intent: Palliative     ==========DELIVERED PLANS==========  First Treatment Date: 2024-02-02 Last Treatment Date: 2024-02-09   Plan Name: Brain_SRT Site: Brain PTV_1_LParietoOccip_30mm PTV_2_RParietal_95mm Technique: SBRT/SRT-IMRT Mode: Photon Dose Per Fraction: 9 Gy Prescribed Dose (Delivered / Prescribed): 27 Gy / 27 Gy Prescribed Fxs (Delivered / Prescribed): 3 / 3     ==========ON TREATMENT VISIT DATES========== 2024-02-02, 2024-02-07, 2024-02-09    See weekly On Treatment Notes in Epic for details in the Media tab (listed as Progress notes on the On Treatment Visit Dates listed above). The patient tolerated radiation and was offered craniotomy to resect the larger of the two lesions, but she decided to forgo this.  The patient will receive a call in about one month from the radiation oncology department. She will continue follow up with Dr. Sherrod as well as in our brain oncology program with repeat MRI in 3 months time.      Donald KYM Husband, PAC

## 2024-02-11 DIAGNOSIS — Z7951 Long term (current) use of inhaled steroids: Secondary | ICD-10-CM | POA: Diagnosis not present

## 2024-02-11 DIAGNOSIS — Z9181 History of falling: Secondary | ICD-10-CM | POA: Diagnosis not present

## 2024-02-11 DIAGNOSIS — Z556 Problems related to health literacy: Secondary | ICD-10-CM | POA: Diagnosis not present

## 2024-02-11 DIAGNOSIS — Z681 Body mass index (BMI) 19 or less, adult: Secondary | ICD-10-CM | POA: Diagnosis not present

## 2024-02-11 DIAGNOSIS — C3490 Malignant neoplasm of unspecified part of unspecified bronchus or lung: Secondary | ICD-10-CM | POA: Diagnosis not present

## 2024-02-11 DIAGNOSIS — L89152 Pressure ulcer of sacral region, stage 2: Secondary | ICD-10-CM | POA: Diagnosis not present

## 2024-02-11 DIAGNOSIS — K2100 Gastro-esophageal reflux disease with esophagitis, without bleeding: Secondary | ICD-10-CM | POA: Diagnosis not present

## 2024-02-11 DIAGNOSIS — J449 Chronic obstructive pulmonary disease, unspecified: Secondary | ICD-10-CM | POA: Diagnosis not present

## 2024-02-11 DIAGNOSIS — R634 Abnormal weight loss: Secondary | ICD-10-CM | POA: Diagnosis not present

## 2024-02-14 ENCOUNTER — Other Ambulatory Visit

## 2024-02-14 ENCOUNTER — Ambulatory Visit: Admitting: Physician Assistant

## 2024-02-14 ENCOUNTER — Ambulatory Visit

## 2024-02-14 ENCOUNTER — Encounter: Admitting: Dietician

## 2024-02-14 DIAGNOSIS — J449 Chronic obstructive pulmonary disease, unspecified: Secondary | ICD-10-CM | POA: Diagnosis not present

## 2024-02-14 DIAGNOSIS — C3490 Malignant neoplasm of unspecified part of unspecified bronchus or lung: Secondary | ICD-10-CM | POA: Diagnosis not present

## 2024-02-16 ENCOUNTER — Other Ambulatory Visit: Payer: Self-pay | Admitting: Radiation Therapy

## 2024-02-16 DIAGNOSIS — C7931 Secondary malignant neoplasm of brain: Secondary | ICD-10-CM

## 2024-02-17 ENCOUNTER — Other Ambulatory Visit: Payer: Self-pay

## 2024-02-18 ENCOUNTER — Emergency Department (HOSPITAL_COMMUNITY)

## 2024-02-18 ENCOUNTER — Encounter (HOSPITAL_COMMUNITY): Payer: Self-pay

## 2024-02-18 ENCOUNTER — Other Ambulatory Visit: Payer: Self-pay

## 2024-02-18 ENCOUNTER — Inpatient Hospital Stay (HOSPITAL_COMMUNITY)
Admission: EM | Admit: 2024-02-18 | Discharge: 2024-02-22 | DRG: 871 | Disposition: A | Discharge status: Hospice | Attending: Internal Medicine | Admitting: Internal Medicine

## 2024-02-18 DIAGNOSIS — L8990 Pressure ulcer of unspecified site, unspecified stage: Secondary | ICD-10-CM | POA: Insufficient documentation

## 2024-02-18 DIAGNOSIS — Z681 Body mass index (BMI) 19 or less, adult: Secondary | ICD-10-CM

## 2024-02-18 DIAGNOSIS — E1165 Type 2 diabetes mellitus with hyperglycemia: Secondary | ICD-10-CM | POA: Diagnosis not present

## 2024-02-18 DIAGNOSIS — L89312 Pressure ulcer of right buttock, stage 2: Secondary | ICD-10-CM | POA: Diagnosis not present

## 2024-02-18 DIAGNOSIS — Z96649 Presence of unspecified artificial hip joint: Secondary | ICD-10-CM | POA: Diagnosis present

## 2024-02-18 DIAGNOSIS — R0603 Acute respiratory distress: Secondary | ICD-10-CM | POA: Diagnosis not present

## 2024-02-18 DIAGNOSIS — K208 Other esophagitis without bleeding: Secondary | ICD-10-CM | POA: Diagnosis present

## 2024-02-18 DIAGNOSIS — B379 Candidiasis, unspecified: Secondary | ICD-10-CM | POA: Diagnosis not present

## 2024-02-18 DIAGNOSIS — L89322 Pressure ulcer of left buttock, stage 2: Secondary | ICD-10-CM | POA: Diagnosis not present

## 2024-02-18 DIAGNOSIS — Z79899 Other long term (current) drug therapy: Secondary | ICD-10-CM

## 2024-02-18 DIAGNOSIS — R531 Weakness: Secondary | ICD-10-CM | POA: Diagnosis not present

## 2024-02-18 DIAGNOSIS — Z1152 Encounter for screening for COVID-19: Secondary | ICD-10-CM

## 2024-02-18 DIAGNOSIS — Z66 Do not resuscitate: Secondary | ICD-10-CM | POA: Diagnosis present

## 2024-02-18 DIAGNOSIS — J441 Chronic obstructive pulmonary disease with (acute) exacerbation: Secondary | ICD-10-CM | POA: Diagnosis present

## 2024-02-18 DIAGNOSIS — R6520 Severe sepsis without septic shock: Secondary | ICD-10-CM | POA: Diagnosis present

## 2024-02-18 DIAGNOSIS — R059 Cough, unspecified: Secondary | ICD-10-CM | POA: Diagnosis not present

## 2024-02-18 DIAGNOSIS — A4151 Sepsis due to Escherichia coli [E. coli]: Principal | ICD-10-CM | POA: Diagnosis present

## 2024-02-18 DIAGNOSIS — J439 Emphysema, unspecified: Secondary | ICD-10-CM | POA: Diagnosis present

## 2024-02-18 DIAGNOSIS — Z96659 Presence of unspecified artificial knee joint: Secondary | ICD-10-CM | POA: Diagnosis present

## 2024-02-18 DIAGNOSIS — Z9221 Personal history of antineoplastic chemotherapy: Secondary | ICD-10-CM

## 2024-02-18 DIAGNOSIS — D696 Thrombocytopenia, unspecified: Secondary | ICD-10-CM | POA: Diagnosis present

## 2024-02-18 DIAGNOSIS — J440 Chronic obstructive pulmonary disease with acute lower respiratory infection: Secondary | ICD-10-CM | POA: Diagnosis not present

## 2024-02-18 DIAGNOSIS — A419 Sepsis, unspecified organism: Secondary | ICD-10-CM | POA: Diagnosis not present

## 2024-02-18 DIAGNOSIS — J189 Pneumonia, unspecified organism: Secondary | ICD-10-CM | POA: Diagnosis not present

## 2024-02-18 DIAGNOSIS — N179 Acute kidney failure, unspecified: Secondary | ICD-10-CM | POA: Diagnosis not present

## 2024-02-18 DIAGNOSIS — J449 Chronic obstructive pulmonary disease, unspecified: Secondary | ICD-10-CM | POA: Diagnosis not present

## 2024-02-18 DIAGNOSIS — R918 Other nonspecific abnormal finding of lung field: Secondary | ICD-10-CM | POA: Diagnosis not present

## 2024-02-18 DIAGNOSIS — Z515 Encounter for palliative care: Secondary | ICD-10-CM

## 2024-02-18 DIAGNOSIS — E43 Unspecified severe protein-calorie malnutrition: Secondary | ICD-10-CM | POA: Diagnosis not present

## 2024-02-18 DIAGNOSIS — R739 Hyperglycemia, unspecified: Secondary | ICD-10-CM | POA: Diagnosis present

## 2024-02-18 DIAGNOSIS — E119 Type 2 diabetes mellitus without complications: Secondary | ICD-10-CM | POA: Diagnosis not present

## 2024-02-18 DIAGNOSIS — D6959 Other secondary thrombocytopenia: Secondary | ICD-10-CM | POA: Diagnosis present

## 2024-02-18 DIAGNOSIS — R64 Cachexia: Secondary | ICD-10-CM | POA: Diagnosis present

## 2024-02-18 DIAGNOSIS — I10 Essential (primary) hypertension: Secondary | ICD-10-CM | POA: Diagnosis not present

## 2024-02-18 DIAGNOSIS — C3431 Malignant neoplasm of lower lobe, right bronchus or lung: Secondary | ICD-10-CM | POA: Diagnosis present

## 2024-02-18 DIAGNOSIS — F17210 Nicotine dependence, cigarettes, uncomplicated: Secondary | ICD-10-CM | POA: Diagnosis present

## 2024-02-18 DIAGNOSIS — D630 Anemia in neoplastic disease: Secondary | ICD-10-CM | POA: Diagnosis present

## 2024-02-18 DIAGNOSIS — D649 Anemia, unspecified: Secondary | ICD-10-CM

## 2024-02-18 DIAGNOSIS — Z90710 Acquired absence of both cervix and uterus: Secondary | ICD-10-CM

## 2024-02-18 DIAGNOSIS — C7931 Secondary malignant neoplasm of brain: Secondary | ICD-10-CM | POA: Diagnosis not present

## 2024-02-18 DIAGNOSIS — Z635 Disruption of family by separation and divorce: Secondary | ICD-10-CM

## 2024-02-18 DIAGNOSIS — R7989 Other specified abnormal findings of blood chemistry: Secondary | ICD-10-CM | POA: Diagnosis not present

## 2024-02-18 DIAGNOSIS — J9601 Acute respiratory failure with hypoxia: Secondary | ICD-10-CM | POA: Diagnosis present

## 2024-02-18 DIAGNOSIS — Z7189 Other specified counseling: Secondary | ICD-10-CM | POA: Diagnosis not present

## 2024-02-18 DIAGNOSIS — T451X5A Adverse effect of antineoplastic and immunosuppressive drugs, initial encounter: Secondary | ICD-10-CM | POA: Diagnosis present

## 2024-02-18 DIAGNOSIS — Z923 Personal history of irradiation: Secondary | ICD-10-CM

## 2024-02-18 DIAGNOSIS — I2489 Other forms of acute ischemic heart disease: Secondary | ICD-10-CM | POA: Diagnosis not present

## 2024-02-18 DIAGNOSIS — R0602 Shortness of breath: Secondary | ICD-10-CM | POA: Diagnosis not present

## 2024-02-18 LAB — COMPREHENSIVE METABOLIC PANEL WITH GFR
ALT: 34 U/L (ref 0–44)
AST: 44 U/L — ABNORMAL HIGH (ref 15–41)
Albumin: 1.7 g/dL — ABNORMAL LOW (ref 3.5–5.0)
Alkaline Phosphatase: 128 U/L — ABNORMAL HIGH (ref 38–126)
Anion gap: 16 — ABNORMAL HIGH (ref 5–15)
BUN: 62 mg/dL — ABNORMAL HIGH (ref 8–23)
CO2: 16 mmol/L — ABNORMAL LOW (ref 22–32)
Calcium: 8.6 mg/dL — ABNORMAL LOW (ref 8.9–10.3)
Chloride: 101 mmol/L (ref 98–111)
Creatinine, Ser: 1.45 mg/dL — ABNORMAL HIGH (ref 0.44–1.00)
GFR, Estimated: 36 mL/min — ABNORMAL LOW (ref >60)
Glucose, Bld: 272 mg/dL — ABNORMAL HIGH (ref 70–99)
Potassium: 4.3 mmol/L (ref 3.5–5.1)
Sodium: 133 mmol/L — ABNORMAL LOW (ref 135–145)
Total Bilirubin: 0.6 mg/dL (ref 0.0–1.2)
Total Protein: 5.5 g/dL — ABNORMAL LOW (ref 6.5–8.1)

## 2024-02-18 LAB — GLUCOSE, CAPILLARY: Glucose-Capillary: 229 mg/dL — ABNORMAL HIGH (ref 70–99)

## 2024-02-18 LAB — CBC
HCT: 39.6 % (ref 36.0–46.0)
HCT: 39.5 % (ref 36.0–46.0)
Hemoglobin: 12.8 g/dL (ref 12.0–15.0)
Hemoglobin: 12.8 g/dL (ref 12.0–15.0)
MCH: 30.8 pg (ref 26.0–34.0)
MCH: 30.4 pg (ref 26.0–34.0)
MCHC: 32.3 g/dL (ref 30.0–36.0)
MCHC: 32.4 g/dL (ref 30.0–36.0)
MCV: 95.4 fL (ref 80.0–100.0)
MCV: 93.8 fL (ref 80.0–100.0)
Platelets: 45 K/uL — ABNORMAL LOW (ref 150–400)
Platelets: 35 K/uL — ABNORMAL LOW (ref 150–400)
RBC: 4.15 MIL/uL (ref 3.87–5.11)
RBC: 4.21 MIL/uL (ref 3.87–5.11)
RDW: 19.2 % — ABNORMAL HIGH (ref 11.5–15.5)
RDW: 19.6 % — ABNORMAL HIGH (ref 11.5–15.5)
WBC: 4.5 K/uL (ref 4.0–10.5)
WBC: 3.1 K/uL — ABNORMAL LOW (ref 4.0–10.5)
nRBC: 0 % (ref 0.0–0.2)
nRBC: 0 % (ref 0.0–0.2)

## 2024-02-18 LAB — DIFFERENTIAL
Abs Immature Granulocytes: 0.01 K/uL (ref 0.00–0.07)
Basophils Absolute: 0 K/uL (ref 0.0–0.1)
Basophils Relative: 0 %
Eosinophils Absolute: 0 K/uL (ref 0.0–0.5)
Eosinophils Relative: 0 %
Immature Granulocytes: 0 %
Lymphocytes Relative: 2 %
Lymphs Abs: 0.1 K/uL — ABNORMAL LOW (ref 0.7–4.0)
Monocytes Absolute: 0.1 K/uL (ref 0.1–1.0)
Monocytes Relative: 2 %
Neutro Abs: 3 K/uL (ref 1.7–7.7)
Neutrophils Relative %: 96 %
Smear Review: NORMAL

## 2024-02-18 LAB — I-STAT CG4 LACTIC ACID, ED
Lactic Acid, Venous: 9.2 mmol/L (ref 0.5–1.9)
Lactic Acid, Venous: 5.6 mmol/L (ref 0.5–1.9)

## 2024-02-18 LAB — TECHNOLOGIST SMEAR REVIEW

## 2024-02-18 LAB — BRAIN NATRIURETIC PEPTIDE: B Natriuretic Peptide: 92.3 pg/mL (ref 0.0–100.0)

## 2024-02-18 LAB — TROPONIN I (HIGH SENSITIVITY)
Troponin I (High Sensitivity): 43 ng/L — ABNORMAL HIGH (ref <18)
Troponin I (High Sensitivity): 38 ng/L — ABNORMAL HIGH (ref <18)

## 2024-02-18 LAB — RESP PANEL BY RT-PCR (RSV, FLU A&B, COVID)  RVPGX2
Influenza A by PCR: NEGATIVE
Influenza B by PCR: NEGATIVE
Resp Syncytial Virus by PCR: NEGATIVE
SARS Coronavirus 2 by RT PCR: NEGATIVE

## 2024-02-18 MED ORDER — SODIUM CHLORIDE 0.9 % IV SOLN: INTRAVENOUS | Status: AC

## 2024-02-18 MED ORDER — INSULIN ASPART 100 UNIT/ML IJ SOLN
0.0000 [IU] | Freq: Every day | INTRAMUSCULAR | Status: DC
  Administered 2024-02-18: 2 [IU] via SUBCUTANEOUS

## 2024-02-18 MED ORDER — PROCHLORPERAZINE EDISYLATE 10 MG/2ML IJ SOLN
5.0000 mg | INTRAMUSCULAR | Status: DC | PRN
  Administered 2024-02-19: 5 mg via INTRAVENOUS
  Filled 2024-02-18: qty 2

## 2024-02-18 MED ORDER — INSULIN ASPART 100 UNIT/ML IJ SOLN
0.0000 [IU] | Freq: Three times a day (TID) | INTRAMUSCULAR | Status: DC
  Administered 2024-02-19 – 2024-02-20 (×7): 1 [IU] via SUBCUTANEOUS
  Administered 2024-02-21: 2 [IU] via SUBCUTANEOUS
  Administered 2024-02-21 (×2): 1 [IU] via SUBCUTANEOUS
  Administered 2024-02-21: 2 [IU] via SUBCUTANEOUS

## 2024-02-18 MED ORDER — IPRATROPIUM-ALBUTEROL 0.5-2.5 (3) MG/3ML IN SOLN: 3.0000 mL | RESPIRATORY_TRACT | Status: DC | PRN

## 2024-02-18 MED ORDER — DOXYCYCLINE HYCLATE 100 MG PO TABS
100.0000 mg | ORAL_TABLET | Freq: Once | ORAL | Status: AC
  Administered 2024-02-18: 100 mg via ORAL
  Filled 2024-02-18: qty 1

## 2024-02-18 MED ORDER — LACTATED RINGERS IV BOLUS
500.0000 mL | Freq: Once | INTRAVENOUS | Status: AC
  Administered 2024-02-18: 500 mL via INTRAVENOUS

## 2024-02-18 MED ORDER — ACETAMINOPHEN 325 MG PO TABS: 650.0000 mg | ORAL_TABLET | Freq: Four times a day (QID) | ORAL | Status: DC | PRN

## 2024-02-18 MED ORDER — SODIUM CHLORIDE 0.9 % IV SOLN
2.0000 g | Freq: Once | INTRAVENOUS | Status: AC
  Administered 2024-02-18: 2 g via INTRAVENOUS
  Filled 2024-02-18: qty 12.5

## 2024-02-18 MED ORDER — FENTANYL CITRATE PF 50 MCG/ML IJ SOSY: 12.5000 ug | PREFILLED_SYRINGE | INTRAMUSCULAR | Status: DC | PRN

## 2024-02-18 MED ORDER — ACETAMINOPHEN 650 MG RE SUPP: 650.0000 mg | Freq: Four times a day (QID) | RECTAL | Status: DC | PRN

## 2024-02-18 MED ORDER — PREDNISONE 20 MG PO TABS
40.0000 mg | ORAL_TABLET | Freq: Every day | ORAL | Status: DC
  Administered 2024-02-20 – 2024-02-22 (×6): 40 mg via ORAL
  Filled 2024-02-18 (×3): qty 2

## 2024-02-18 MED ORDER — METHYLPREDNISOLONE SODIUM SUCC 125 MG IJ SOLR
125.0000 mg | Freq: Two times a day (BID) | INTRAMUSCULAR | Status: AC
  Administered 2024-02-19 (×2): 125 mg via INTRAVENOUS
  Filled 2024-02-18 (×2): qty 2

## 2024-02-18 MED ORDER — LACTATED RINGERS IV BOLUS (SEPSIS)
1000.0000 mL | Freq: Once | INTRAVENOUS | Status: AC
  Administered 2024-02-18: 1000 mL via INTRAVENOUS

## 2024-02-18 MED ORDER — LACTATED RINGERS IV SOLN: INTRAVENOUS | Status: AC

## 2024-02-18 MED ORDER — SODIUM CHLORIDE 0.9 % IV SOLN: 2.0000 g | INTRAVENOUS | Status: DC

## 2024-02-18 MED ORDER — SODIUM CHLORIDE 0.9% FLUSH
3.0000 mL | Freq: Two times a day (BID) | INTRAVENOUS | Status: DC
  Administered 2024-02-18 – 2024-02-22 (×13): 3 mL via INTRAVENOUS

## 2024-02-18 MED ORDER — SENNOSIDES-DOCUSATE SODIUM 8.6-50 MG PO TABS: 1.0000 | ORAL_TABLET | Freq: Every evening | ORAL | Status: DC | PRN

## 2024-02-18 MED ORDER — OXYCODONE HCL 5 MG PO TABS: 2.5000 mg | ORAL_TABLET | ORAL | Status: DC | PRN

## 2024-02-18 MED ORDER — LACTATED RINGERS IV BOLUS (SEPSIS)
500.0000 mL | Freq: Once | INTRAVENOUS | Status: AC
  Administered 2024-02-18: 500 mL via INTRAVENOUS

## 2024-02-18 MED ORDER — SODIUM CHLORIDE 0.9 % IV SOLN
100.0000 mg | Freq: Two times a day (BID) | INTRAVENOUS | Status: DC
  Filled 2024-02-18: qty 100

## 2024-02-18 NOTE — ED Provider Notes (Signed)
 [place] EMERGENCY DEPARTMENT AT [facility] Provider Note   CSN: [id number] Arrival date & time: 02/18/24  1721     Patient presents with: Shortness of Breath   Kristy Salazar is a 83 y.o. female.    Shortness of Breath    Patient has a history of COPD lung cancer diabetes metastasis to the brain, vasogenic edema.  She is currently receiving immunotherapy for her CA.  Patient presents to the ER for shortness of breath.  Patient states the symptoms started a couple days ago.  Patient denies any fevers or chest pain.  She has not had any leg swelling.  She tried her breathing treatments without relief.  Symptoms were getting worse so today she called EMS.  EMS treated the patient with 2 breathing treatments as well as Solu-Medrol  and magnesium .  Prior to Admission medications   Medication Sig Start Date End Date Taking? Authorizing Provider  dexamethasone  (DECADRON ) 4 MG tablet Take 1.5 tablets (6 mg total) by mouth 4 (four) times daily. 01/30/24   Ghimire, Donalda HERO, MD  FARXIGA 10 MG TABS tablet Take 10 mg by mouth daily. Patient not taking: Reported on 01/25/2024 07/27/23   [provider]  Fluticasone-Umeclidin-Vilant 100-62.5-25 MCG/INH AEPB Inhale 2 application  into the lungs daily as needed.    [provider]  HYDROcodone -acetaminophen  (NORCO/VICODIN) 5-325 MG tablet Take 1 tablet by mouth every 6 (six) hours as needed for moderate pain (pain score 4-6). 12/22/23   Heilingoetter, Cassandra L, PA-C  montelukast  (SINGULAIR ) 10 MG tablet Take 10 mg by mouth daily. For seasonal allergies    [provider]  Multiple Vitamin (MULTIVITAMIN WITH MINERALS) TABS tablet Take 1 tablet by mouth daily.    [provider]  pantoprazole  (PROTONIX ) 40 MG tablet Take 1 tablet (40 mg total) by mouth daily. 01/30/24   Ghimire, Donalda HERO, MD  rosuvastatin  (CRESTOR ) 10 MG tablet Take 10 mg by mouth daily.      [provider]  sucralfate  (CARAFATE )  1 GM/10ML suspension Take 1 g by mouth 4 (four) times daily -  with meals and at bedtime.    [provider]  tiotropium (SPIRIVA ) 18 MCG inhalation capsule Place 18 mcg into inhaler and inhale daily.    [provider]    Allergies: Erythromycin    Review of Systems  Respiratory:  Positive for shortness of breath.     Updated Vital Signs BP 108/67   Pulse (!) 120   Temp 98.5 F (36.9 C) (Oral)   Resp (!) 34   SpO2 93%   Physical Exam Vitals and nursing note reviewed.  Constitutional:      Appearance: She is well-developed. She is ill-appearing.  HENT:     Head: Normocephalic and atraumatic.     Right Ear: External ear normal.     Left Ear: External ear normal.  Eyes:     General: No scleral icterus.       Right eye: No discharge.        Left eye: No discharge.     Conjunctiva/sclera: Conjunctivae normal.  Neck:     Trachea: No tracheal deviation.  Cardiovascular:     Rate and Rhythm: Regular rhythm. Tachycardia present.  Pulmonary:     Effort: Tachypnea and accessory muscle usage present. No respiratory distress.     Breath sounds: Normal breath sounds. No stridor. No wheezing or rales.  Abdominal:     General: Bowel sounds are normal. There is no distension.  Palpations: Abdomen is soft.     Tenderness: There is no abdominal tenderness. There is no guarding or rebound.  Musculoskeletal:        General: No tenderness or deformity.     Cervical back: Neck supple.     Right lower leg: No edema.     Left lower leg: No edema.  Skin:    General: Skin is warm and dry.     Findings: No rash.  Neurological:     General: No focal deficit present.     Mental Status: She is alert.     Cranial Nerves: No cranial nerve deficit, dysarthria or facial asymmetry.     Sensory: No sensory deficit.     Motor: No abnormal muscle tone or seizure activity.     Coordination: Coordination normal.  Psychiatric:        Mood and Affect: Mood normal.     (all  labs ordered are listed, but only abnormal results are displayed) Labs Reviewed  COMPREHENSIVE METABOLIC PANEL WITH GFR - Abnormal; Notable for the following components:      Result Value   Sodium 133 (*)    CO2 16 (*)    Glucose, Bld 272 (*)    BUN 62 (*)    Creatinine, Ser 1.45 (*)    Calcium  8.6 (*)    Total Protein 5.5 (*)    Albumin 1.7 (*)    AST 44 (*)    Alkaline Phosphatase 128 (*)    GFR, Estimated 36 (*)    Anion gap 16 (*)    All other components within normal limits  CBC - Abnormal; Notable for the following components:   RDW 19.2 (*)    Platelets 45 (*)    All other components within normal limits  I-STAT CG4 LACTIC ACID, ED - Abnormal; Notable for the following components:   Lactic Acid, Venous 9.2 (*)    All other components within normal limits  TROPONIN I (HIGH SENSITIVITY) - Abnormal; Notable for the following components:   Troponin I (High Sensitivity) 43 (*)    All other components within normal limits  RESP PANEL BY RT-PCR (RSV, FLU A&B, COVID)  RVPGX2  CULTURE, BLOOD (ROUTINE X 2)  CULTURE, BLOOD (ROUTINE X 2)  BRAIN NATRIURETIC PEPTIDE  URINALYSIS, W/ REFLEX TO CULTURE (INFECTION SUSPECTED)  I-STAT CG4 LACTIC ACID, ED  TROPONIN I (HIGH SENSITIVITY)    EKG: Report in muse  Radiology: [facility] Chest Port 1 View Result Date: 02/18/2024 CLINICAL DATA:  Shortness of breath and coughing. EXAM: PORTABLE CHEST 1 VIEW COMPARISON:  01/25/2024. FINDINGS: The heart size and mediastinal contours are stable. There is atherosclerotic calcification of the aorta. A right chest port appear stable in position. Emphysematous changes are noted in the lungs. Patchy airspace disease is noted at the lung bases. No effusion or pneumothorax is seen. The previously described right lower lobe mass is not distinctly seen on this exam. No acute osseous abnormality. IMPRESSION: 1. Patchy airspace disease at the lung bases, suspicious for pneumonia. 2. Previously described right lower lobe  mass is not seen due to overlying densities. 3. Emphysema. Electronically Signed   By: Leita Birmingham M.D.   On: 02/18/2024 18:10     .Critical Care  Performed by: Randol Simmonds, MD Authorized by: Randol Simmonds, MD   Critical care provider statement:    Critical care time (minutes):  30   Critical care was time spent personally by me on the following activities:  Development of treatment  plan with patient or surrogate, discussions with consultants, evaluation of patient's response to treatment, examination of patient, ordering and review of laboratory studies, ordering and review of radiographic studies, ordering and performing treatments and interventions, pulse oximetry, re-evaluation of patient's condition and review of old charts    Medications Ordered in the ED  lactated ringers  infusion (has no administration in time range)  doxycycline  (VIBRA -TABS) tablet 100 mg (has no administration in time range)  lactated ringers  bolus 500 mL (500 mLs Intravenous New Bag/Given 02/18/24 1804)  lactated ringers  bolus 1,000 mL (1,000 mLs Intravenous New Bag/Given 02/18/24 1855)    And  lactated ringers  bolus 500 mL (500 mLs Intravenous New Bag/Given 02/18/24 1932)  ceFEPIme  (MAXIPIME ) 2 g in sodium chloride  0.9 % 100 mL IVPB (2 g Intravenous New Bag/Given 02/18/24 1857)    Clinical Course as of 02/18/24 2006  Sat Feb 18, 2024  1814 Chest x-ray shows patchy airspace disease in the lung bases concerning for pneumonia.  Patient also has emphysema [JK]  1814 I-Stat CG4 Lactic Acid(!!) Lactic acid level severely elevated.  Concerning for the possibility of evolving sepsis.  Will initiate sepsis protocol with IV fluids cultures and antibiotics [JK]  2001 Heart rate improving at the bedside.  Blood pressure remained stable. [JK]  2002 CBC(!) No leukocytosis [JK]  2002 Comprehensive metabolic panel(!) Creatinine elevated compared to previous [JK]    Clinical Course User Index [JK] Randol Simmonds, MD                                  Medical Decision Making Torrential diagnosis includes but not limited to pneumonia PE CHF ACS  Problems Addressed: AKI (acute kidney injury) [facility]): acute illness or injury that poses a threat to life or bodily functions Community acquired pneumonia, unspecified laterality: acute illness or injury that poses a threat to life or bodily functions Sepsis, due to unspecified organism, unspecified whether acute organ dysfunction present [facility]): acute illness or injury that poses a threat to life or bodily functions  Amount and/or Complexity of Data Reviewed Labs: ordered. Decision-making details documented in ED Course. Radiology: ordered.  Risk Prescription drug management.   Patient presented to the ED for evaluation of shortness of breath coughing.  Patient noted to be significantly tachycardic on arrival.  Presentation concerning for the possibility of evolving sepsis.  Patient's tachycardia is improving with IV fluid hydration.  Laboratory test showed significantly elevated lactic acid level suggestive of evolving sepsis.  Troponin mildly elevated but BNP normal.  Doubt CHF.  Chest x-ray shows evidence of pneumonia.  Labs are also notable for an AKI with elevated BUN and creatinine.  Patient was started on antibiotics and fluid resuscitation.  Blood pressure stable.  Tachycardia slowly improving.  I will consult the medical service for admission.     Final diagnoses:  Sepsis, due to unspecified organism, unspecified whether acute organ dysfunction present [facility])  AKI (acute kidney injury) (HCC)  Community acquired pneumonia, unspecified laterality    ED Discharge Orders     None          Randol Simmonds, MD 02/18/24 2006

## 2024-02-18 NOTE — ED Notes (Signed)
Got patient into a gown on the monitor did EKG shown to Dr Lynelle Doctor

## 2024-02-18 NOTE — ED Triage Notes (Addendum)
 Pt BIB [facility] from home with c/o [facility] that started couple of days ago and coughing. Pt family think she has UTI, urine is dark and strong odor. Cancer pt, taking prednisone , she has been drinking a lot of water. Family gave pt 2 breathing treatments prior to EMS arrival. Hr in the 150s. 125 solumedrol, 2g mag, 2 duonebs PTA.   106/70 HR 138 RR 42  CBG 324

## 2024-02-18 NOTE — H&P (Signed)
 History and Physical    Kristy Salazar FMW:[id number] DOB: 11-Jul-1941 DOA: 02/18/2024  PCP: Benjamine Aland, MD   Patient coming from: Home   Chief Complaint: Fatigue, cough, SOB  HPI: Kristy Salazar is an 83 y.o. female with medical history significant for COPD and lung cancer with brain metastases who presents with shortness of breath and cough.  Patient was visiting family in [place]  where she is said to have lost her appetite a few days ago.  When her daughter picked her up last night, patient seemed to be very fatigued and was noted to have a mild cough.  She has been lethargic throughout the day today, feels generally poor, but has trouble describing her symptoms in any detail.  She specifically denies chest pain or abdominal pain.  EMS was called and she was treated with IV Solu-Medrol , IV magnesium , and DuoNeb x2 prior to arrival in the ED.   ED Course: Upon arrival to the ED, patient is found to be afebrile and saturating mid 90s on 4 L/min of supplemental oxygen with tachypnea, tachycardia, and SBP of 92 and greater.  Labs are most notable for BUN 62, creatinine 1.45, glucose 272, albumin 1.7, normal bilirubin, normal WBC, platelets 45,000, lactic acid 9.2, troponin 43, and BNP 92.  Blood cultures were collected in the ED and the patient was given 2 L of LR, cefepime , and doxycycline .  Review of Systems:  All other systems reviewed and apart from HPI, are negative.  Past Medical History:  Diagnosis Date   Arthritis    Aspiration pneumonia (HCC) 09/09/2023   Asthma    COPD (chronic obstructive pulmonary disease) (HCC)    Diabetes mellitus    patient denies   Hypertension    Non-small cell lung cancer (HCC)    Radiation esophagitis    Seasonal allergies     Past Surgical History:  Procedure Laterality Date   ABDOMINAL HYSTERECTOMY     APPENDECTOMY     BACK SURGERY     BRONCHIAL BIOPSY  05/17/2023   Procedure: BRONCHIAL BIOPSIES;  Surgeon: Shelah Lamar RAMAN, MD;  Location:  [facility] ENDOSCOPY;  Service: Pulmonary;;   BRONCHIAL BRUSHINGS  05/17/2023   Procedure: BRONCHIAL BRUSHINGS;  Surgeon: Shelah Lamar RAMAN, MD;  Location: [facility] ENDOSCOPY;  Service: Pulmonary;;   BRONCHIAL NEEDLE ASPIRATION BIOPSY  05/17/2023   Procedure: BRONCHIAL NEEDLE ASPIRATION BIOPSIES;  Surgeon: Shelah Lamar RAMAN, MD;  Location: [facility] ENDOSCOPY;  Service: Pulmonary;;   ESOPHAGOGASTRODUODENOSCOPY N/A 11/14/2023   Procedure: EGD (ESOPHAGOGASTRODUODENOSCOPY);  Surgeon: Rosalie Kitchens, MD;  Location: [facility] ENDOSCOPY;  Service: Gastroenterology;  Laterality: N/A;   ESOPHAGOGASTRODUODENOSCOPY (EGD) WITH PROPOFOL  N/A 09/13/2023   Procedure: ESOPHAGOGASTRODUODENOSCOPY (EGD) WITH PROPOFOL ;  Surgeon: Kriss Estefana DEL, DO;  Location: [facility] ENDOSCOPY;  Service: Gastroenterology;  Laterality: N/A;   IR IMAGING GUIDED PORT INSERTION  06/03/2023   JOINT REPLACEMENT     TOTAL HIP ARTHROPLASTY     TOTAL KNEE ARTHROPLASTY     VIDEO BRONCHOSCOPY WITH ENDOBRONCHIAL ULTRASOUND Right 05/17/2023   Procedure: VIDEO BRONCHOSCOPY WITH ENDOBRONCHIAL ULTRASOUND;  Surgeon: Shelah Lamar RAMAN, MD;  Location: [facility] ENDOSCOPY;  Service: Pulmonary;  Laterality: Right;    Social History:   reports that she has been smoking cigarettes. She has a 12.5 pack-year smoking history. She has never used smokeless tobacco. She reports that she does not drink alcohol  and does not use drugs.  Allergies  Allergen Reactions   Erythromycin Hives    History reviewed. No pertinent family history.   Prior to Admission  medications   Medication Sig Start Date End Date Taking? Authorizing Provider  dexamethasone  (DECADRON ) 4 MG tablet Take 1.5 tablets (6 mg total) by mouth 4 (four) times daily. 01/30/24   Ghimire, Donalda HERO, MD  FARXIGA 10 MG TABS tablet Take 10 mg by mouth daily. Patient not taking: Reported on 01/25/2024 07/27/23   [provider]  Fluticasone-Umeclidin-Vilant 100-62.5-25 MCG/INH AEPB Inhale 2 application  into the lungs daily as needed.     [provider]  HYDROcodone -acetaminophen  (NORCO/VICODIN) 5-325 MG tablet Take 1 tablet by mouth every 6 (six) hours as needed for moderate pain (pain score 4-6). 12/22/23   Heilingoetter, Cassandra L, PA-C  montelukast  (SINGULAIR ) 10 MG tablet Take 10 mg by mouth daily. For seasonal allergies    [provider]  Multiple Vitamin (MULTIVITAMIN WITH MINERALS) TABS tablet Take 1 tablet by mouth daily.    [provider]  pantoprazole  (PROTONIX ) 40 MG tablet Take 1 tablet (40 mg total) by mouth daily. 01/30/24   Ghimire, Donalda HERO, MD  rosuvastatin  (CRESTOR ) 10 MG tablet Take 10 mg by mouth daily.      [provider]  sucralfate  (CARAFATE ) 1 GM/10ML suspension Take 1 g by mouth 4 (four) times daily -  with meals and at bedtime.    [provider]  tiotropium (SPIRIVA ) 18 MCG inhalation capsule Place 18 mcg into inhaler and inhale daily.    [provider]    Physical Exam: Vitals:   02/18/24 1735 02/18/24 1745 02/18/24 1814 02/18/24 2003  BP: 92/71 101/68  108/67  Pulse: (!) 142 (!) 143  (!) 120  Resp: (!) 30 (!) 34    Temp:   98.5 F (36.9 C)   TempSrc:   Oral   SpO2: 92% 93%      Constitutional: NAD, no pallor or diaphoresis   Eyes: PERTLA, lids and conjunctivae normal ENMT: Mucous membranes are moist. Posterior pharynx clear of any exudate or lesions.   Neck: supple, no masses  Respiratory: Labored respirations. Prolonged expiratory phase, coarse rales, expiratory wheezing.   Cardiovascular: Rate ~120 and regular. No extremity edema.  Abdomen: No tenderness, soft. Bowel sounds active.  Musculoskeletal: no clubbing / cyanosis. No joint deformity upper and lower extremities.   Skin: no significant rashes, lesions, ulcers. Warm, dry, well-perfused. Neurologic: Sleeping. Wakes to voice. Mild dysarthria. Moving all extremities. Oriented to person, place, and situation.   Psychiatric: Calm. Cooperative.    Labs and Imaging on  Admission: I have personally reviewed following labs and imaging studies  CBC: Recent Labs  Lab 02/18/24 1738  WBC 4.5  HGB 12.8  HCT 39.6  MCV 95.4  PLT 45*   Basic Metabolic Panel: Recent Labs  Lab 02/18/24 1738  NA 133*  K 4.3  CL 101  CO2 16*  GLUCOSE 272*  BUN 62*  CREATININE 1.45*  CALCIUM  8.6*   GFR: Estimated Creatinine Clearance: 21.9 mL/min (A) (by C-G formula based on SCr of 1.45 mg/dL (H)). Liver Function Tests: Recent Labs  Lab 02/18/24 1738  AST 44*  ALT 34  ALKPHOS 128*  BILITOT 0.6  PROT 5.5*  ALBUMIN 1.7*   No results for input(s): LIPASE, AMYLASE in the last 168 hours. No results for input(s): AMMONIA in the last 168 hours. Coagulation Profile: No results for input(s): INR, PROTIME in the last 168 hours. Cardiac Enzymes: No results for input(s): CKTOTAL, CKMB, CKMBINDEX, TROPONINI in the last 168 hours. BNP (last 3 results) No results for input(s): PROBNP in the last  8760 hours. HbA1C: No results for input(s): HGBA1C in the last 72 hours. CBG: No results for input(s): GLUCAP in the last 168 hours. Lipid Profile: No results for input(s): CHOL, HDL, LDLCALC, TRIG, CHOLHDL, LDLDIRECT in the last 72 hours. Thyroid  Function Tests: No results for input(s): TSH, T4TOTAL, FREET4, T3FREE, THYROIDAB in the last 72 hours. Anemia Panel: No results for input(s): VITAMINB12, FOLATE, FERRITIN, TIBC, IRON, RETICCTPCT in the last 72 hours. Urine analysis:    Component Value Date/Time   COLORURINE YELLOW 01/25/2024 1619   APPEARANCEUR CLEAR 01/25/2024 1619   LABSPEC 1.011 01/25/2024 1619   PHURINE 6.0 01/25/2024 1619   GLUCOSEU >=500 (A) 01/25/2024 1619   HGBUR NEGATIVE 01/25/2024 1619   BILIRUBINUR NEGATIVE 01/25/2024 1619   KETONESUR NEGATIVE 01/25/2024 1619   PROTEINUR NEGATIVE 01/25/2024 1619   UROBILINOGEN 0.2 05/10/2011 0930   NITRITE NEGATIVE 01/25/2024 1619   LEUKOCYTESUR NEGATIVE  01/25/2024 1619   Sepsis Labs: @LABRCNTIP (procalcitonin:4,lacticidven:4) ) Recent Results (from the past 240 hours)  Resp panel by RT-PCR (RSV, Flu A&B, Covid) Anterior Nasal Swab     Status: None   Collection Time: 02/18/24  5:38 PM   Specimen: Anterior Nasal Swab  Result Value Ref Range Status   SARS Coronavirus 2 by RT PCR NEGATIVE NEGATIVE Final   Influenza A by PCR NEGATIVE NEGATIVE Final   Influenza B by PCR NEGATIVE NEGATIVE Final    Comment: (NOTE) The Xpert Xpress SARS-CoV-2/FLU/RSV plus assay is intended as an aid in the diagnosis of influenza from Nasopharyngeal swab specimens and should not be used as a sole basis for treatment. Nasal washings and aspirates are unacceptable for Xpert Xpress SARS-CoV-2/FLU/RSV testing.  Fact Sheet for Patients: BloggerCourse.com  Fact Sheet for Healthcare Providers: SeriousBroker.it  This test is not yet approved or cleared by the [place]  FDA and has been authorized for detection and/or diagnosis of SARS-CoV-2 by FDA under an Emergency Use Authorization (EUA). This EUA will remain in effect (meaning this test can be used) for the duration of the COVID-19 declaration under Section 564(b)(1) of the Act, 21 U.S.C. section 360bbb-3(b)(1), unless the authorization is terminated or revoked.     Resp Syncytial Virus by PCR NEGATIVE NEGATIVE Final    Comment: (NOTE) Fact Sheet for Patients: BloggerCourse.com  Fact Sheet for Healthcare Providers: SeriousBroker.it  This test is not yet approved or cleared by the [place]  FDA and has been authorized for detection and/or diagnosis of SARS-CoV-2 by FDA under an Emergency Use Authorization (EUA). This EUA will remain in effect (meaning this test can be used) for the duration of the COVID-19 declaration under Section 564(b)(1) of the Act, 21 U.S.C. section 360bbb-3(b)(1), unless the  authorization is terminated or revoked.  Performed at [facility], [address], [place], [place] [address]      Radiological Exams on Admission: DG Chest Port 1 View Result Date: 02/18/2024 CLINICAL DATA:  Shortness of breath and coughing. EXAM: PORTABLE CHEST 1 VIEW COMPARISON:  01/25/2024. FINDINGS: The heart size and mediastinal contours are stable. There is atherosclerotic calcification of the aorta. A right chest port appear stable in position. Emphysematous changes are noted in the lungs. Patchy airspace disease is noted at the lung bases. No effusion or pneumothorax is seen. The previously described right lower lobe mass is not distinctly seen on this exam. No acute osseous abnormality. IMPRESSION: 1. Patchy airspace disease at the lung bases, suspicious for pneumonia. 2. Previously described right lower lobe mass is not seen due to overlying densities.  3. Emphysema. Electronically Signed   By: Leita Birmingham M.D.   On: 02/18/2024 18:10    EKG: Independently reviewed. Sinus tachycardia, rate 144, PVC.   Assessment/Plan   1. Severe sepsis d/t pneumonia; COPD exacerbation; acute hypoxic respiratory failure  - Culture sputum, continue antibiotics, systemic steroids, short-acting bronchodilators, and supplemental O2, trend lactate, follow cultures and clinical course    2. AKI  - Continue IVF hydration, renally-dose medications, repeat chem panel in am    3. Thrombocytopenia  - Platelets 43,000 on admission, previously normal or high  - Likely d/t sepsis; Hgb, WBC, and bilirubin are normal - Request smear review, treat underlying sepsis, repeat CBC in am   4. Lung cancer  - S/p chemoradiation followed by immunotherapy under the care of Dr. Sherrod; receiving [facility] for brain mets    5. Type II DM  - Check CBGs and use low-intensity SSI for now    6. Elevated troponin  - Mildly elevated and flat without anginal complaints and in setting of severe sepsis likely reflects  demand ischemia  - Treat underlying illness   7. Goals of care  - Patient is lethargic in ED and has trouble participating in conversation; her daughter at the bedside does not believe that the patient would want CPR or intubation but wants to discuss with her siblings    DVT prophylaxis: SCDs  Code Status: Full  Level of Care: Level of care: Progressive Family Communication: Daughter at bedside   Disposition Plan:  Patient is from: home  Anticipated d/c is to: TBD Anticipated d/c date is: 02/21/24  Patient currently: Pending treatment of severe sepsis  Consults called: None Admission status: Inpatient     Evalene GORMAN Sprinkles, MD [facility]  02/18/2024, 8:34 PM

## 2024-02-18 NOTE — Sepsis Progress Note (Signed)
 At 22:00, BSRN was made aware of need for f/u lactic through secure chat, RN acknowledged and lab aware

## 2024-02-18 NOTE — Progress Notes (Signed)
 Elink following for sepsis protocol.

## 2024-02-19 ENCOUNTER — Other Ambulatory Visit: Payer: Self-pay

## 2024-02-19 ENCOUNTER — Encounter (HOSPITAL_COMMUNITY): Payer: Self-pay | Admitting: Family Medicine

## 2024-02-19 DIAGNOSIS — J189 Pneumonia, unspecified organism: Secondary | ICD-10-CM | POA: Diagnosis not present

## 2024-02-19 DIAGNOSIS — A419 Sepsis, unspecified organism: Secondary | ICD-10-CM | POA: Diagnosis not present

## 2024-02-19 DIAGNOSIS — Z66 Do not resuscitate: Secondary | ICD-10-CM | POA: Diagnosis not present

## 2024-02-19 DIAGNOSIS — N179 Acute kidney failure, unspecified: Secondary | ICD-10-CM | POA: Diagnosis not present

## 2024-02-19 DIAGNOSIS — Z515 Encounter for palliative care: Secondary | ICD-10-CM

## 2024-02-19 DIAGNOSIS — Z7189 Other specified counseling: Secondary | ICD-10-CM

## 2024-02-19 DIAGNOSIS — C7931 Secondary malignant neoplasm of brain: Secondary | ICD-10-CM | POA: Diagnosis not present

## 2024-02-19 LAB — GLUCOSE, CAPILLARY
Glucose-Capillary: 173 mg/dL — ABNORMAL HIGH (ref 70–99)
Glucose-Capillary: 148 mg/dL — ABNORMAL HIGH (ref 70–99)
Glucose-Capillary: 179 mg/dL — ABNORMAL HIGH (ref 70–99)
Glucose-Capillary: 155 mg/dL — ABNORMAL HIGH (ref 70–99)
Glucose-Capillary: 138 mg/dL — ABNORMAL HIGH (ref 70–99)

## 2024-02-19 LAB — BLOOD CULTURE ID PANEL (REFLEXED) - BCID2

## 2024-02-19 LAB — BASIC METABOLIC PANEL WITH GFR
Anion gap: 9 (ref 5–15)
BUN: 52 mg/dL — ABNORMAL HIGH (ref 8–23)
CO2: 19 mmol/L — ABNORMAL LOW (ref 22–32)
Calcium: 8.3 mg/dL — ABNORMAL LOW (ref 8.9–10.3)
Chloride: 109 mmol/L (ref 98–111)
Creatinine, Ser: 1.01 mg/dL — ABNORMAL HIGH (ref 0.44–1.00)
GFR, Estimated: 56 mL/min — ABNORMAL LOW (ref >60)
Glucose, Bld: 180 mg/dL — ABNORMAL HIGH (ref 70–99)
Potassium: 3.8 mmol/L (ref 3.5–5.1)
Sodium: 137 mmol/L (ref 135–145)

## 2024-02-19 LAB — HEPATIC FUNCTION PANEL
ALT: 29 U/L (ref 0–44)
AST: 30 U/L (ref 15–41)
Albumin: <1.5 g/dL — ABNORMAL LOW (ref 3.5–5.0)
Alkaline Phosphatase: 108 U/L (ref 38–126)
Bilirubin, Direct: 0.3 mg/dL — ABNORMAL HIGH (ref 0.0–0.2)
Indirect Bilirubin: 0.5 mg/dL (ref 0.3–0.9)
Total Bilirubin: 0.8 mg/dL (ref 0.0–1.2)
Total Protein: 4.8 g/dL — ABNORMAL LOW (ref 6.5–8.1)

## 2024-02-19 LAB — CBC
HCT: 31.7 % — ABNORMAL LOW (ref 36.0–46.0)
Hemoglobin: 10.9 g/dL — ABNORMAL LOW (ref 12.0–15.0)
MCH: 31.1 pg (ref 26.0–34.0)
MCHC: 34.4 g/dL (ref 30.0–36.0)
MCV: 90.6 fL (ref 80.0–100.0)
Platelets: 36 K/uL — ABNORMAL LOW (ref 150–400)
RBC: 3.5 MIL/uL — ABNORMAL LOW (ref 3.87–5.11)
RDW: 18.8 % — ABNORMAL HIGH (ref 11.5–15.5)
WBC: 5.5 K/uL (ref 4.0–10.5)
nRBC: 0 % (ref 0.0–0.2)

## 2024-02-19 LAB — TECHNOLOGIST SMEAR REVIEW

## 2024-02-19 LAB — LACTIC ACID, PLASMA: Lactic Acid, Venous: 3.6 mmol/L (ref 0.5–1.9)

## 2024-02-19 MED ORDER — NYSTATIN NICU ORAL SYRINGE 100,000 UNITS/ML: 2.0000 mL | Freq: Four times a day (QID) | OROMUCOSAL | Status: DC

## 2024-02-19 MED ORDER — SODIUM CHLORIDE 0.9 % IV SOLN
2.0000 g | INTRAVENOUS | Status: DC
  Administered 2024-02-19 – 2024-02-21 (×5): 2 g via INTRAVENOUS
  Filled 2024-02-19 (×3): qty 20

## 2024-02-19 MED ORDER — DOXYCYCLINE HYCLATE 100 MG PO TABS
100.0000 mg | ORAL_TABLET | Freq: Two times a day (BID) | ORAL | Status: DC
  Administered 2024-02-19 – 2024-02-22 (×11): 100 mg via ORAL
  Filled 2024-02-19 (×6): qty 1

## 2024-02-19 MED ORDER — FLUCONAZOLE 200 MG PO TABS
200.0000 mg | ORAL_TABLET | Freq: Every day | ORAL | Status: DC
  Administered 2024-02-20 – 2024-02-22 (×6): 200 mg via ORAL
  Filled 2024-02-19 (×3): qty 1

## 2024-02-19 MED ORDER — NYSTATIN 100000 UNIT/ML MT SUSP
5.0000 mL | Freq: Four times a day (QID) | OROMUCOSAL | Status: DC
  Administered 2024-02-19 – 2024-02-22 (×22): 500000 [IU] via ORAL
  Filled 2024-02-19 (×12): qty 5

## 2024-02-19 MED ORDER — FLUCONAZOLE 200 MG PO TABS
200.0000 mg | ORAL_TABLET | Freq: Once | ORAL | Status: AC
  Administered 2024-02-19: 200 mg via ORAL
  Filled 2024-02-19: qty 1

## 2024-02-19 NOTE — Consult Note (Signed)
 Palliative Medicine Inpatient Consult Note  Consulting Provider: Cherlyn Labella, MD   Reason for consult:   Palliative Care Consult Services Palliative Medicine Consult  Reason for Consult? goals of care   02/19/2024  HPI:  Per intake H&P --> Kristy Salazar is an 83 y.o. female with medical history significant for COPD, HTN, arthritis, and lung cancer with brain metastases who presents with shortness of breath and cough. Palliative care has been asked to support additional goals of care conversations.   Clinical Assessment/Goals of Care:  *Please note that this is a verbal dictation therefore any spelling or grammatical errors are due to the Dragon Medical One system interpretation.  I have reviewed medical records including EPIC notes, labs and imaging, received report from bedside RN, assessed the patient who is resting in bed grimacing intermittently when swallowing.    I met with Kristy Salazar and her daughter, Kristy Salazar to further discuss diagnosis prognosis, GOC, EOL wishes, disposition and options.   I introduced Palliative Medicine as specialized medical care for people living with serious illness. It focuses on providing relief from the symptoms and stress of a serious illness. The goal is to improve quality of life for both the patient and the family.  Medical History Review and Understanding:  A review of Kristy Salazar's past medical history inclusive of hypertension, non-small cell lung cancer, COPD,  type 2 diabetes, hypertension, and adult failure to thrive was completed  Social History:  Kristy Salazar is from [place] .  She is legally separated.  She has 4 sons, 1 daughter, and 13 grandchildren.  She formally worked in various positions inclusive of the [employer], working in Associate Professor, and working at [employer] in [place].  She is a woman who used to enjoy crocheting, traveling, crossword puzzles, and watching lawn order on the television.  She is a woman of faith  practicing within the  [employer] denomination.  Functional and Nutritional State:  Prior to hospitalization and was able to stand and pivot with the help of her daughter.  Her daughter has lived with her since February of this year.  And was able to help and dressing herself and feeding herself.  She has had weight loss since cancer diagnosis and treatment.  Palliative Symptoms:  Kristy Salazar denies pain, shortness of breath, nausea.  An has notable white patches on her tongue and appears to grimace when she swallows.   Advance Directives:  A detailed discussion was had today regarding advanced directives.  Patient does not have advanced directives though we did discuss completing these during hospitalization and the importance of these documents when you have multiple children.  Code Status:  Concepts specific to code status, artifical feeding and hydration, continued IV antibiotics and rehospitalization was had.  The difference between a aggressive medical intervention path  and a palliative comfort care path for this patient at this time was had.   Encouraged patient/family to consider DNR/DNI status understanding evidenced based poor outcomes in similar hospitalized patient, as the cause of arrest is likely associated with advanced chronic/terminal illness rather than an easily reversible acute cardio-pulmonary event. I explained that DNR/DNI does not change the medical plan and it only comes into effect after a person has arrested (died).  It is a protective measure to keep us  from harming the patient in their last moments of life. Kristy Salazar was agreeable to DNR/DNI with understanding that patient would not receive CPR, defibrillation, ACLS medications, or intubation.   Discussion:  We reviewed that over the past week,  Kristy Salazar was with her son in [place]  and had been eating less and generally more lethargic.  Her daughter was on a cruise and when she returned she noticed her mothers obvious decline.   Initially once she got her mother home to [facility] she had declined medical intervention though finally agreed to it yesterday.  We reviewed that and is being treated for pneumonia at this time.  We discussed the concerns associated with Kristy Salazar's past medical history and current illness.  We reviewed speaking to the oncology team to get a better idea of patient's long-term prognosis in the setting of her cancer with metastasis to her brain.  From Kristy Salazar's perspective she feels she has been doing well related to her oncological care.  I did discuss with her and and her daughter the idea of hospice should she fall within a window of less than 6 months which would be a terminal prognosis. I described hospice as a service for patients who have a life expectancy of 6 months or less. The goal of hospice is the preservation of dignity and quality at the end phases of life. Under hospice care, the focus changes from curative to symptom relief.   At this point we will continue treatment for pneumonia and allow time for outcomes.  Patient does share that she has a granddaughter who is getting married in a year from now which ideally would be a goal if she could live that long.  Discussed the importance of continued conversation with family and their  medical providers regarding overall plan of care and treatment options, ensuring decisions are within the context of the patients values and GOCs.  Decision Maker: Kristy Salazar, Kristy Salazar (Daughter): [phone] (Mobile)   SUMMARY OF RECOMMENDATIONS   DNAR/DNI  I have requested chaplain consultation for advance directive completion  Continue present measures and treatment for pneumonia  Appreciate Dr. Nanda insights on overall prognosis  Appreciate pharmacy assistance with dosing Diflucan  for significant thrush  Ongoing palliative care support  Code Status/Advance Care Planning: DNAR/DNI  Palliative Prophylaxis:  Aspiration, Bowel Regimen, Delirium  Protocol, Frequent Pain Assessment, Oral Care, Palliative Wound Care, and Turn Reposition  Additional Recommendations (Limitations, Scope, Preferences): Continue present measures  Psycho-social/Spiritual:  Desire for further Chaplaincy support: Patient is a member of [employer] denomination Additional Recommendations: Education on cancer progression and anticipated needs   Prognosis: Limited overall awaiting oncology insights  Discharge Planning: Discharge will likely be to home once medically optimized  Vitals:   02/18/24 2321 02/19/24 0240  BP: 98/60 91/64  Pulse: (!) 122 (!) 111  Resp: 20 20  Temp: 98.8 F (37.1 C) 99.1 F (37.3 C)  SpO2: 92% 92%    Intake/Output Summary (Last 24 hours) at 02/19/2024 0747 Last data filed at 02/19/2024 0500 Gross per 24 hour  Intake 1333.24 ml  Output --  Net 1333.24 ml   Last Weight  Most recent update: 02/19/2024  5:13 AM    Weight  47.5 kg (104 lb 11.5 oz)             Gen: Elderly African-American female chronically ill in appearance HEENT: White patches on tongue, dry mucous membranes CV: Regular rate and irregular rhythm  PULM: On 4 L nasal cannula breathing is even and nonlabored ABD: soft/nontender  EXT: Muscle wasting noted in all 4 extremities Neuro: Alert and oriented x3   PPS: 30%   This conversation/these recommendations were discussed with patient primary care team, Dr. Cherlyn ______________________________________________________ [facility] Palliative Medicine Team Team Cell  Phone: [phone] Please utilize secure chat with additional questions, if there is no response within 30 minutes please call the above phone number  Total Time: 75 Billing based on MDM: High  Palliative Medicine Team providers are available by phone from 7am to 7pm daily and can be reached through the team cell phone.  Should this patient require assistance outside of these hours, please call the patient's attending  physician.

## 2024-02-19 NOTE — Progress Notes (Signed)
 [facility]                                                                               Findley Blankenbaker, is a 83 y.o. female, DOB - August 30, 1940, FMW:[id number] Admit date - 02/18/2024    Outpatient Primary MD for the patient is Benjamine Aland, MD  LOS - 1  days    Brief summary    Kristy Salazar is an 83 y.o. female with medical history significant for COPD and lung cancer with brain metastases who presents with shortness of breath and cough.  She was admitted for CAP.    Assessment & Plan    Assessment and Plan:   Severe sepsis d/t pneumonia; COPD exacerbation; acute hypoxic respiratory failure  Continue with IV antibiotics.  Trend lactate,  continue with rocephin  and doxycycline .  [place] oxygen to keep sats greater than 90%.  On prednisone  taper to complete the course.    2. AKI  - creatinine much improved after IV fluids.    3. Thrombocytopenia  Monitor.   4. Lung cancer  - S/p chemoradiation followed by immunotherapy under the care of Dr. Sherrod; receiving [facility] for brain mets     5. Type II DM  CBG (last 3)  Recent Labs    02/19/24 0830 02/19/24 1229 02/19/24 1654  GLUCAP 148* 179* 155*   Resume SSI.    6. Elevated troponin  - Mildly elevated and flat without anginal complaints and in setting of severe sepsis likely reflects demand ischemia  - currently denies any chest pain.    7. Goals of care  - palliative care consulted for GOC.   8. Thrush On diflucan . Nystatin  added.     Estimated body mass index is 14.61 kg/m as calculated from the following:   Height as of this encounter: 5' 11 (1.803 m).   Weight as of this encounter: 47.5 kg.  Code Status: DNR  DVT Prophylaxis:  SCDs Start: 02/18/24 2029   Level of Care: Level of care: Progressive Family Communication: family at bedside.   Disposition Plan:     Remains inpatient appropriate:  pending clinical improvement.   Procedures:  None.   Consultants:   Palliative.    Antimicrobials:   Anti-infectives (From admission, onward)    Start     Dose/Rate Route Frequency Ordered Stop   02/20/24 1000  fluconazole  (DIFLUCAN ) tablet 200 mg        200 mg Oral Daily 02/19/24 1054     02/19/24 1800  ceFEPIme  (MAXIPIME ) 2 g in sodium chloride  0.9 % 100 mL IVPB  Status:  Discontinued        2 g 200 mL/hr over 30 Minutes Intravenous Every 24 hours 02/18/24 2037 02/19/24 0616   02/19/24 1000  fluconazole  (DIFLUCAN ) tablet 200 mg        200 mg Oral  Once 02/19/24 0909 02/19/24 1034   02/19/24 0630  doxycycline  (VIBRAMYCIN ) 100 mg in sodium chloride  0.9 % 250 mL IVPB  Status:  Discontinued        100 mg 125 mL/hr over 120 Minutes Intravenous Every 12 hours 02/18/24 2032 02/19/24 0615   02/19/24 0630  cefTRIAXone  (ROCEPHIN ) 2  g in sodium chloride  0.9 % 100 mL IVPB        2 g 200 mL/hr over 30 Minutes Intravenous Every 24 hours 02/19/24 0616     02/18/24 1830  ceFEPIme  (MAXIPIME ) 2 g in sodium chloride  0.9 % 100 mL IVPB        2 g 200 mL/hr over 30 Minutes Intravenous  Once 02/18/24 1816 02/18/24 2023   02/18/24 1830  doxycycline  (VIBRA -TABS) tablet 100 mg        100 mg Oral  Once 02/18/24 1816 02/18/24 2034        Medications  Scheduled Meds:  [START ON 02/20/2024] fluconazole   200 mg Oral Daily   insulin  aspart  0-5 Units Subcutaneous QHS   insulin  aspart  0-6 Units Subcutaneous TID WC   methylPREDNISolone  (SOLU-MEDROL ) injection  125 mg Intravenous Q12H   Followed by   [START ON 02/20/2024] predniSONE   40 mg Oral Q breakfast   sodium chloride  flush  3 mL Intravenous Q12H   Continuous Infusions:  cefTRIAXone  (ROCEPHIN )  IV 2 g (02/19/24 0630)   PRN Meds:.acetaminophen  **OR** acetaminophen , fentaNYL  (SUBLIMAZE ) injection, ipratropium-albuterol , oxyCODONE , prochlorperazine , senna-docusate    Subjective:   Jerline Linzy was seen and examined today.  No new complaints.   Objective:   Vitals:   02/19/24 0900 02/19/24 1226 02/19/24 1247 02/19/24 1651   BP:  90/60  105/62  Pulse: (!) 102 (!) 102 94 88  Resp: 16 20 20  (!) 21  Temp:  98.2 F (36.8 C)  98.5 F (36.9 C)  TempSrc:  Oral  Oral  SpO2: 95% 96% 94% 94%  Weight:      Height:        Intake/Output Summary (Last 24 hours) at 02/19/2024 1714 Last data filed at 02/19/2024 0500 Gross per 24 hour  Intake 1333.24 ml  Output --  Net 1333.24 ml   Filed Weights   02/18/24 2300  Weight: 47.5 kg     Exam General exam: Appears calm and comfortable  Respiratory system: Clear to auscultation. Respiratory effort normal. Cardiovascular system: S1 & S2 heard, RRR. No JVD,  Gastrointestinal system: Abdomen is nondistended, soft and nontender.  Central nervous system: Alert and oriented.  Extremities: Symmetric 5 x 5 power. Skin: No rashes,  Psychiatry: Mood & affect appropriate.    Data Reviewed:  I have personally reviewed following labs and imaging studies   CBC Lab Results  Component Value Date   WBC 5.5 02/19/2024   RBC 3.50 (L) 02/19/2024   HGB 10.9 (L) 02/19/2024   HCT 31.7 (L) 02/19/2024   MCV 90.6 02/19/2024   MCH 31.1 02/19/2024   PLT 36 (L) 02/19/2024   MCHC 34.4 02/19/2024   RDW 18.8 (H) 02/19/2024   LYMPHSABS 0.1 (L) 02/18/2024   MONOABS 0.1 02/18/2024   EOSABS 0.0 02/18/2024   BASOSABS 0.0 02/18/2024     Last metabolic panel Lab Results  Component Value Date   NA 137 02/19/2024   K 3.8 02/19/2024   CL 109 02/19/2024   CO2 19 (L) 02/19/2024   BUN 52 (H) 02/19/2024   CREATININE 1.01 (H) 02/19/2024   GLUCOSE 180 (H) 02/19/2024   GFRNONAA 56 (L) 02/19/2024   GFRAA >90 05/10/2011   CALCIUM  8.3 (L) 02/19/2024   PHOS 1.7 (L) 09/15/2023   PROT 4.8 (L) 02/19/2024   ALBUMIN <1.5 (L) 02/19/2024   BILITOT 0.8 02/19/2024   ALKPHOS 108 02/19/2024   AST 30 02/19/2024   ALT 29 02/19/2024   ANIONGAP 9 02/19/2024  CBG (last 3)  Recent Labs    02/19/24 0830 02/19/24 1229 02/19/24 1654  GLUCAP 148* 179* 155*      Coagulation Profile: No  results for input(s): INR, PROTIME in the last 168 hours.   Radiology Studies: DG Chest Port 1 View Result Date: 02/18/2024 CLINICAL DATA:  Shortness of breath and coughing. EXAM: PORTABLE CHEST 1 VIEW COMPARISON:  01/25/2024. FINDINGS: The heart size and mediastinal contours are stable. There is atherosclerotic calcification of the aorta. A right chest port appear stable in position. Emphysematous changes are noted in the lungs. Patchy airspace disease is noted at the lung bases. No effusion or pneumothorax is seen. The previously described right lower lobe mass is not distinctly seen on this exam. No acute osseous abnormality. IMPRESSION: 1. Patchy airspace disease at the lung bases, suspicious for pneumonia. 2. Previously described right lower lobe mass is not seen due to overlying densities. 3. Emphysema. Electronically Signed   By: Leita Birmingham M.D.   On: 02/18/2024 18:10       Elgie Butter M.D. [facility] 02/19/2024, 5:14 PM  Available via Epic secure chat 7am-7pm After 7 pm, please refer to night coverage provider listed on amion.

## 2024-02-19 NOTE — Progress Notes (Signed)
 PHARMACY - PHYSICIAN COMMUNICATION CRITICAL VALUE ALERT - BLOOD CULTURE IDENTIFICATION (BCID)  Kristy Salazar is an 83 y.o. female who presented to [facility] on 02/18/2024 with a chief complaint of sepsis  Name of physician (or Provider) Contacted: Dr. Charlton  Current antibiotics: Cefepime , Doxycycline   Changes to prescribed antibiotics recommended:  DC Cefepime , Doxycyline Start Ceftriaxone  2g IV q24h  Results for orders placed or performed during the hospital encounter of 02/18/24  Blood Culture ID Panel (Reflexed) (Collected: 02/18/2024  6:40 PM)  Result Value Ref Range   Enterococcus faecalis NOT DETECTED NOT DETECTED   Enterococcus Faecium NOT DETECTED NOT DETECTED   Listeria monocytogenes NOT DETECTED NOT DETECTED   Staphylococcus species NOT DETECTED NOT DETECTED   Staphylococcus aureus (BCID) NOT DETECTED NOT DETECTED   Staphylococcus epidermidis NOT DETECTED NOT DETECTED   Staphylococcus lugdunensis NOT DETECTED NOT DETECTED   Streptococcus species NOT DETECTED NOT DETECTED   Streptococcus agalactiae NOT DETECTED NOT DETECTED   Streptococcus pneumoniae NOT DETECTED NOT DETECTED   Streptococcus pyogenes NOT DETECTED NOT DETECTED   A.calcoaceticus-baumannii NOT DETECTED NOT DETECTED   Bacteroides fragilis NOT DETECTED NOT DETECTED   Enterobacterales DETECTED (A) NOT DETECTED   Enterobacter cloacae complex NOT DETECTED NOT DETECTED   Escherichia coli DETECTED (A) NOT DETECTED   Klebsiella aerogenes NOT DETECTED NOT DETECTED   Klebsiella oxytoca NOT DETECTED NOT DETECTED   Klebsiella pneumoniae NOT DETECTED NOT DETECTED   Proteus species NOT DETECTED NOT DETECTED   Salmonella species NOT DETECTED NOT DETECTED   Serratia marcescens NOT DETECTED NOT DETECTED   Haemophilus influenzae NOT DETECTED NOT DETECTED   Neisseria meningitidis NOT DETECTED NOT DETECTED   Pseudomonas aeruginosa NOT DETECTED NOT DETECTED   Stenotrophomonas maltophilia NOT DETECTED NOT DETECTED   Candida  albicans NOT DETECTED NOT DETECTED   Candida auris NOT DETECTED NOT DETECTED   Candida glabrata NOT DETECTED NOT DETECTED   Candida krusei NOT DETECTED NOT DETECTED   Candida parapsilosis NOT DETECTED NOT DETECTED   Candida tropicalis NOT DETECTED NOT DETECTED   Cryptococcus neoformans/gattii NOT DETECTED NOT DETECTED   CTX-M ESBL NOT DETECTED NOT DETECTED   Carbapenem resistance IMP NOT DETECTED NOT DETECTED   Carbapenem resistance KPC NOT DETECTED NOT DETECTED   Carbapenem resistance NDM NOT DETECTED NOT DETECTED   Carbapenem resist OXA 48 LIKE NOT DETECTED NOT DETECTED   Carbapenem resistance VIM NOT DETECTED NOT DETECTED   Lynwood Mckusick, PharmD, BCPS Clinical Pharmacist Phone: [phone]

## 2024-02-20 DIAGNOSIS — D696 Thrombocytopenia, unspecified: Secondary | ICD-10-CM | POA: Diagnosis not present

## 2024-02-20 DIAGNOSIS — J189 Pneumonia, unspecified organism: Secondary | ICD-10-CM | POA: Diagnosis not present

## 2024-02-20 DIAGNOSIS — D649 Anemia, unspecified: Secondary | ICD-10-CM

## 2024-02-20 DIAGNOSIS — C7931 Secondary malignant neoplasm of brain: Secondary | ICD-10-CM | POA: Diagnosis not present

## 2024-02-20 DIAGNOSIS — A419 Sepsis, unspecified organism: Secondary | ICD-10-CM | POA: Diagnosis not present

## 2024-02-20 DIAGNOSIS — Z7189 Other specified counseling: Secondary | ICD-10-CM | POA: Diagnosis not present

## 2024-02-20 DIAGNOSIS — C3431 Malignant neoplasm of lower lobe, right bronchus or lung: Secondary | ICD-10-CM | POA: Diagnosis not present

## 2024-02-20 DIAGNOSIS — Z515 Encounter for palliative care: Secondary | ICD-10-CM | POA: Diagnosis not present

## 2024-02-20 DIAGNOSIS — L8990 Pressure ulcer of unspecified site, unspecified stage: Secondary | ICD-10-CM | POA: Insufficient documentation

## 2024-02-20 DIAGNOSIS — N179 Acute kidney failure, unspecified: Secondary | ICD-10-CM | POA: Diagnosis not present

## 2024-02-20 LAB — LACTIC ACID, PLASMA: Lactic Acid, Venous: 2 mmol/L (ref 0.5–1.9)

## 2024-02-20 LAB — CBC
HCT: 32.4 % — ABNORMAL LOW (ref 36.0–46.0)
Hemoglobin: 10.8 g/dL — ABNORMAL LOW (ref 12.0–15.0)
MCH: 30.4 pg (ref 26.0–34.0)
MCHC: 33.3 g/dL (ref 30.0–36.0)
MCV: 91.3 fL (ref 80.0–100.0)
Platelets: 28 K/uL — CL (ref 150–400)
RBC: 3.55 MIL/uL — ABNORMAL LOW (ref 3.87–5.11)
RDW: 19.4 % — ABNORMAL HIGH (ref 11.5–15.5)
WBC: 7.8 K/uL (ref 4.0–10.5)
nRBC: 0 % (ref 0.0–0.2)

## 2024-02-20 LAB — BASIC METABOLIC PANEL WITH GFR
Anion gap: 10 (ref 5–15)
BUN: 44 mg/dL — ABNORMAL HIGH (ref 8–23)
CO2: 18 mmol/L — ABNORMAL LOW (ref 22–32)
Calcium: 8.1 mg/dL — ABNORMAL LOW (ref 8.9–10.3)
Chloride: 111 mmol/L (ref 98–111)
Creatinine, Ser: 0.85 mg/dL (ref 0.44–1.00)
GFR, Estimated: >60 mL/min (ref >60)
Glucose, Bld: 127 mg/dL — ABNORMAL HIGH (ref 70–99)
Potassium: 4 mmol/L (ref 3.5–5.1)
Sodium: 139 mmol/L (ref 135–145)

## 2024-02-20 LAB — GLUCOSE, CAPILLARY
Glucose-Capillary: 127 mg/dL — ABNORMAL HIGH (ref 70–99)
Glucose-Capillary: 170 mg/dL — ABNORMAL HIGH (ref 70–99)
Glucose-Capillary: 163 mg/dL — ABNORMAL HIGH (ref 70–99)
Glucose-Capillary: 154 mg/dL — ABNORMAL HIGH (ref 70–99)

## 2024-02-20 LAB — HEMOGLOBIN A1C
Hgb A1c MFr Bld: 6.8 % — ABNORMAL HIGH (ref 4.8–5.6)
Mean Plasma Glucose: 148 mg/dL

## 2024-02-20 MED ORDER — PREDNISONE 10 MG PO TABS: 10.0000 mg | ORAL_TABLET | Freq: Every day | ORAL | Status: DC

## 2024-02-20 MED ORDER — PREDNISONE 10 MG PO TABS: 5.0000 mg | ORAL_TABLET | Freq: Every day | ORAL | Status: DC

## 2024-02-20 MED ORDER — PREDNISONE 20 MG PO TABS: 20.0000 mg | ORAL_TABLET | Freq: Every day | ORAL | Status: DC

## 2024-02-20 NOTE — Evaluation (Signed)
 Physical Therapy Evaluation Patient Details Name: Kristy Salazar MRN: [id number] DOB: [birth year] Today's Date: 02/20/2024  History of Present Illness  Pt is an 83 y/o F admitted on 02/18/24 after presenting with c/o SOB & cough. Pt is being treated for severe sepsis 2/2 PNA, COPD exacerbation, & acute hypoxic respiratory failure. PMH: COPD, lung CA with brain mets, HTN, SDH, DM, arthritis, back sx  Clinical Impression  Pt seen for PT evaluation with pt agreeable, family present for session. Prior to this past week pt was ambulatory with SPC/RW, participating in HHPT but has experienced a significant decline over the past week. On this date pt requires max assist for bed mobility, max assist for bed>drop arm recliner on R via lateral scoot/pivot. Pt unable to stand with max assist +1 2/2 decreased strength & balance. Pt requires max cuing, mod<>max assist for balance while sitting EOB. Pt would benefit from ongoing skilled PT treatment to progress mobility as able to reduce fall risk & decrease caregiver burden. Recommend post acute rehab <3 hours therapy/day upon d/c.  Pt received on 2L/min via nasal cannula, attempted to wean to room air but SPO2 dropped to 87% after transfer to recliner so placed back on 2L/min with SpO2 >/= 90%; cuing for pursed lip breathing.        If plan is discharge home, recommend the following: Two people to help with walking and/or transfers;Direct supervision/assist for medications management;Two people to help with bathing/dressing/bathroom;Assist for transportation;Assistance with feeding;Direct supervision/assist for financial management;Assistance with cooking/housework;Supervision due to cognitive status;Help with stairs or ramp for entrance   Can travel by private vehicle   No    Equipment Recommendations Hoyer lift;Other (comment) (hoyer lift with sling)  Recommendations for Other Services       Functional Status Assessment Patient has had a recent decline  in their functional status and demonstrates the ability to make significant improvements in function in a reasonable and predictable amount of time.     Precautions / Restrictions Precautions Precautions: Fall Restrictions Weight Bearing Restrictions Per Provider Order: No      Mobility  Bed Mobility Overal bed mobility: Needs Assistance Bed Mobility: Supine to Sit     Supine to sit: Max assist, Used rails, HOB elevated (exit R side of bed, assistance to move BLE off EOB, upright trunk)          Transfers Overall transfer level: Needs assistance Equipment used: None, 1 person hand held assist Transfers: Bed to chair/wheelchair/BSC            Lateral/Scoot Transfers: Max assist General transfer comment: Attempted sit>stand from elevated EOB with HHA but pt unable to clear buttocks, decreased ability to push to standing. Pt assisted to recliner on R via lateral scoot/squat pivot with chair of armrest removed, assistance for scooting/pivoting. Pt requires MAX assist for movement, MAX cuing re: hand placement, sequencing, anterior weight shifting & head/hips relationship to increase ease of movement.    Ambulation/Gait                  Stairs            Wheelchair Mobility     Tilt Bed    Modified Rankin (Stroke Patients Only)       Balance Overall balance assessment: Needs assistance Sitting-balance support: Feet supported, Bilateral upper extremity supported Sitting balance-Leahy Scale: Poor Sitting balance - Comments: LOB L/R & posteriorly with inability to correct without assistance.  Pertinent Vitals/Pain Pain Assessment Pain Assessment: Faces Faces Pain Scale: Hurts little more Pain Location: discomfort 2/2 purewick Pain Descriptors / Indicators: Discomfort Pain Intervention(s): Repositioned (adjusted placement of purewick)    Home Living Family/patient expects to be discharged to::  Private residence Living Arrangements: Children Available Help at Discharge: Family;Available 24 hours/day Type of Home: House Home Access: Stairs to enter Entrance Stairs-Rails: Right;Left;Can reach both Entrance Stairs-Number of Steps: 6   Home Layout: One level Home Equipment: Cane - single Librarian, academic (2 wheels);Rollator (4 wheels);Grab bars - tub/shower;Tub bench;Wheelchair - manual Additional Comments: daughter looking into a ramp    Prior Function Prior Level of Function : Needs assist             Mobility Comments: Since last admission daughter reports pt was doing well, participating in HHPT, ambulatory with SPC/RW but over the past week pt has had a decline, family had to pick her up & carry her to bed/through doorway which is what prompted them to come to hospital.       Extremity/Trunk Assessment   Upper Extremity Assessment Upper Extremity Assessment: Generalized weakness    Lower Extremity Assessment Lower Extremity Assessment: Generalized weakness       Communication   Communication Communication: Impaired Factors Affecting Communication: Hearing impaired (not wearing hearing aides this AM)    Cognition Arousal: Alert Behavior During Therapy: WFL for tasks assessed/performed   PT - Cognitive impairments: Awareness, Problem solving, Safety/Judgement                       PT - Cognition Comments: Pt with decreased awareness/ability to correct LOB. Following commands: Impaired Following commands impaired: Follows one step commands with increased time     Cueing Cueing Techniques: Verbal cues, Tactile cues, Gestural cues     General Comments      Exercises     Assessment/Plan    PT Assessment Patient needs continued PT services  PT Problem List Decreased strength;Decreased range of motion;Decreased balance;Decreased activity tolerance;Decreased mobility;Decreased coordination;Decreased cognition;Decreased knowledge of use of  DME;Decreased safety awareness;Decreased knowledge of precautions;Cardiopulmonary status limiting activity       PT Treatment Interventions DME instruction;Gait training;Stair training;Functional mobility training;Therapeutic activities;Therapeutic exercise;Balance training;Neuromuscular re-education;Cognitive remediation;Patient/family education;Modalities;Wheelchair mobility training    PT Goals (Current goals can be found in the Care Plan section)  Acute Rehab PT Goals Patient Stated Goal: go to rehab to get stronger PT Goal Formulation: With patient/family Time For Goal Achievement: 03/05/24 Potential to Achieve Goals: Good    Frequency Min 2X/week     Co-evaluation               AM-PAC PT 6 Clicks Mobility  Outcome Measure Help needed turning from your back to your side while in a flat bed without using bedrails?: A Lot Help needed moving from lying on your back to sitting on the side of a flat bed without using bedrails?: Total Help needed moving to and from a bed to a chair (including a wheelchair)?: Total Help needed standing up from a chair using your arms (e.g., wheelchair or bedside chair)?: Total Help needed to walk in hospital room?: Total Help needed climbing 3-5 steps with a railing? : Total 6 Click Score: 7    End of Session Equipment Utilized During Treatment: Oxygen Activity Tolerance: Patient limited by fatigue Patient left: in chair;with call bell/phone within reach;with family/visitor present Nurse Communication: Mobility status PT Visit Diagnosis: Difficulty in walking, not elsewhere classified (R26.2);Other abnormalities  of gait and mobility (R26.89);Unsteadiness on feet (R26.81);Muscle weakness (generalized) (M62.81);History of falling (Z91.81)    Time: 9090-9070 PT Time Calculation (min) (ACUTE ONLY): 20 min   Charges:   PT Evaluation $PT Eval High Complexity: 1 High   PT General Charges $$ ACUTE PT VISIT: 1 Visit         Richerd Pinal, PT, DPT 02/20/24, 9:41 AM   Richerd CHRISTELLA Pinal 02/20/2024, 9:39 AM

## 2024-02-20 NOTE — Plan of Care (Signed)

## 2024-02-20 NOTE — Evaluation (Signed)
 Occupational Therapy Evaluation Patient Details Name: Kristy Salazar MRN: [id number] DOB: July 17, 1940 Today's Date: 02/20/2024   History of Present Illness   Pt is an 83 y/o F admitted on 02/18/24 after presenting with c/o SOB & cough. Pt is being treated for severe sepsis 2/2 PNA, COPD exacerbation, & acute hypoxic respiratory failure. PMH: COPD, lung CA with brain mets, HTN, SDH, DM, arthritis, back sx     Clinical Impressions Pt presents with decline in function and safety with ADLs and ADL mobility with impaired strength, balance, endurance and vision. PTA daughter reports that pt has required increased assist with bathing, dressing, self feeding due (over shooting and under shooting) and toileting, using BSC for 3-4 weeks, prior to that pt was Ind with self feeding, , making coffee, with ADLs, assist with tub bench transfers and Sup for showering, participating in HHPT, ambulatory with SPC/RW but over the past week pt has had a decline, family had to pick her up and  carry her to bed/through doorway which is what prompted them to come to hospital. Pt currently requires mod A with self feeding due to visual impairments, min A with grooming tasks and total A with bathing, dressing and toileting. OT will follow acutely to maximize level of function and safety     If plan is discharge home, recommend the following:   Two people to help with walking and/or transfers;A lot of help with bathing/dressing/bathroom;Assistance with feeding;Assist for transportation;Help with stairs or ramp for entrance;Direct supervision/assist for medications management     Functional Status Assessment   Patient has had a recent decline in their functional status and demonstrates the ability to make significant improvements in function in a reasonable and predictable amount of time.     Equipment Recommendations   None recommended by OT     Recommendations for Other Services          Precautions/Restrictions   Precautions Precautions: Fall Recall of Precautions/Restrictions: Intact Precaution/Restrictions Comments: visual deficits Restrictions Weight Bearing Restrictions Per Provider Order: No     Mobility Bed Mobility               General bed mobility comments: pt in chair    Transfers                   General transfer comment: unable to stand      Balance Overall balance assessment: Needs assistance Sitting-balance support: Feet supported, Bilateral upper extremity supported Sitting balance-Leahy Scale: Poor Sitting balance - Comments: leans posteriorly with inability to correct without assistance Postural control: Posterior lean                                 ADL either performed or assessed with clinical judgement   ADL Overall ADL's : Needs assistance/impaired Eating/Feeding: Moderate assistance Eating/Feeding Details (indicate cue type and reason): due to visual impairments Grooming: Wash/dry hands;Wash/dry face;Sitting;Independent       Lower Body Bathing: Total assistance   Upper Body Dressing : Total assistance   Lower Body Dressing: Total assistance       Toileting- Clothing Manipulation and Hygiene: Total assistance;Bed level         General ADL Comments: per PT note, pt max A to scoot transfer to recliner this a.m.     Vision Baseline Vision/History: 1 Wears glasses Ability to See in Adequate Light: 2 Moderately impaired Patient Visual Report: Peripheral vision impairment Vision Assessment?:  Yes Saccades: Overshoots;Undershoots     Perception         Praxis         Pertinent Vitals/Pain Pain Assessment Pain Assessment: Faces Faces Pain Scale: Hurts little more Pain Location: generalized Pain Descriptors / Indicators: Discomfort, Grimacing Pain Intervention(s): Monitored during session, Repositioned     Extremity/Trunk Assessment Upper Extremity Assessment Upper Extremity  Assessment: Generalized weakness;Right hand dominant   Lower Extremity Assessment Lower Extremity Assessment: Defer to PT evaluation   Cervical / Trunk Assessment Cervical / Trunk Assessment: Normal   Communication Communication Communication: Impaired Factors Affecting Communication: Hearing impaired   Cognition Arousal: Alert Behavior During Therapy: WFL for tasks assessed/performed Cognition: No apparent impairments             OT - Cognition Comments: Pt is HOH but has hearding aides                 Following commands: Impaired Following commands impaired: Follows one step commands with increased time     Cueing  General Comments          Exercises     Shoulder Instructions      Home Living Family/patient expects to be discharged to:: Private residence Living Arrangements: Children Available Help at Discharge: Family;Available 24 hours/day Type of Home: House Home Access: Stairs to enter [employer] of Steps: 6 Entrance Stairs-Rails: Right;Left;Can reach both Home Layout: One level     Bathroom Shower/Tub: Chief Strategy Officer: Handicapped height     Home Equipment: Agricultural consultant (2 wheels);Rollator (4 wheels);Grab bars - tub/shower;Tub bench;Wheelchair - manual;Toilet riser;Hand held Financial controller - quad   Additional Comments: daughter looking into a ramp      Prior Functioning/Environment Prior Level of Function : Needs assist             Mobility Comments: Since last admission daughter reports pt was doing well, participating in HHPT, ambulatory with SPC/RW but over the past week pt has had a decline, family had to pick her up & carry her to bed/through doorway which is what prompted them to come to hospital. ADLs Comments: Daughter reports that pt has required increased assist with bathing, dressing, self feeding due (over shooting and under shooting) and toileting, using BSC for 3-4 weeks,  prior to that pt was Ind with ADLs, assist with tub bench transfers and Sup for showering    OT Problem List: Decreased strength;Decreased activity tolerance;Impaired balance (sitting and/or standing)   OT Treatment/Interventions: Self-care/ADL training;Therapeutic exercise;Balance training;Patient/family education;Energy conservation;Therapeutic activities;DME and/or AE instruction;Visual/perceptual remediation/compensation      OT Goals(Current goals can be found in the care plan section)   Acute Rehab OT Goals Patient Stated Goal: get stronger OT Goal Formulation: With patient/family Time For Goal Achievement: 03/05/24 Potential to Achieve Goals: Good ADL Goals Pt Will Perform Eating: with min assist;sitting Pt Will Perform Grooming: with contact guard assist;with supervision;sitting Pt Will Perform Upper Body Bathing: with max assist;with mod assist;sitting Pt Will Perform Upper Body Dressing: with max assist;with mod assist;sitting Pt Will Transfer to Toilet: with max assist;with mod assist;stand pivot transfer;bedside commode   OT Frequency:  Min 2X/week    Co-evaluation              AM-PAC OT 6 Clicks Daily Activity     Outcome Measure Help from another person eating meals?: A Lot Help from another person taking care of personal grooming?: A Little Help from another person toileting, which includes using toliet,  bedpan, or urinal?: Total Help from another person bathing (including washing, rinsing, drying)?: Total Help from another person to put on and taking off regular upper body clothing?: Total Help from another person to put on and taking off regular lower body clothing?: Total 6 Click Score: 9   End of Session Nurse Communication: Mobility status  Activity Tolerance: Patient limited by fatigue Patient left: with call bell/phone within reach;with family/visitor present;in chair  OT Visit Diagnosis: Unsteadiness on feet (R26.81);Other abnormalities of gait  and mobility (R26.89);Muscle weakness (generalized) (M62.81)                Time: 9041-8977 OT Time Calculation (min): 24 min Charges:  OT General Charges $OT Visit: 1 Visit OT Evaluation $OT Eval Moderate Complexity: 1 Mod OT Treatments $Therapeutic Activity: 8-22 mins    Jacques Karna Loose 02/20/2024, 10:51 AM

## 2024-02-20 NOTE — Progress Notes (Signed)
 Kristy Salazar   DOB:[birth year]   FM#:[id number]      ASSESSMENT & PLAN:  Kristy Salazar is an 83 year old female patient with oncologic history significant for NSCLCA.  She was admitted on 02/18/2024 with complaints of fatigue, cough, and shortness of breath.  Non-small cell lung cancer, squamous cell carcinoma, stage IIIb  (T4, N2, M0) - Initially diagnosed September 2024 - Status post chemo/radiation with weekly carbo and paclitaxel , status post 6 cycles. - Later given Imfinzi  which was started 08/29/23 x 4 cycles.  -- Last seen in outpatient oncology office July 2025, she had significant radiation esophagitis with difficulty swallowing. Outpatient appt scheduled for tomorrow cancelled due to this hospitalization.  - Patient states today that she would like to continue treatment.  Discussed need to focus on improving from sepsis/infection and will make outpatient oncology follow up with Dr. Sherrod upon discharge.  Patient's granddaughter and patient agreeable.   -- No planned inpatient chemo or immunotherapy during this hospitalization.  -Medical oncology/Dr. Sherrod following closely.  Sepsis Pneumonia Shortness of breath COPD -- On IV antibiotics and steroids -- Continue O2 therapy -- Continue close monitoring    Thrombocytopenia -- Platelets dropped significantly from 3 weeks ago, 28K today -- Recommend transfusion for counts <20K or <50K with active bleeding -- Monitor CBC with differential  Anemia, normocytic -- Hemoglobin low 10.8 -- No transfusional intervention required at this time -- Continue to monitor CBC with differential    Code Status DNR-Limited  Subjective:  Seen awake and alert laying in bed.  She is chronically ill-appearing and cachectic.  Patient's granddaughter at bedside.  Denies chest pain or other acute pain.  Noted slow responses to questions.  However states that she wants further oncology treatment.  Agreeable to outpatient follow-up.  No other  acute distress is noted.  Objective:  No intake or output data in the 24 hours ending 02/20/24 1330   PHYSICAL EXAMINATION: ECOG PERFORMANCE STATUS: 3 - Symptomatic, >50% confined to bed  Vitals:   02/20/24 0811 02/20/24 1131  BP: 114/69 101/61  Pulse: 84 90  Resp: 20 (!) 24  Temp: 97.6 F (36.4 C) 98.2 F (36.8 C)  SpO2: 93% 94%   Filed Weights   02/18/24 2300 02/20/24 0550  Weight: 104 lb 11.5 oz (47.5 kg) 99 lb 13.9 oz (45.3 kg)    GENERAL: alert, +chronically ill-appearing +cachectic SKIN: skin color, texture, turgor are normal, no rashes or significant lesions EYES: normal, conjunctiva are pink and non-injected, sclera clear OROPHARYNX: no exudate, no erythema and lips, buccal mucosa, and tongue normal  NECK: supple, thyroid  normal size, non-tender, without nodularity LYMPH: no palpable lymphadenopathy in the cervical, axillary or inguinal LUNGS: clear to auscultation and percussion with normal breathing effort HEART: regular rate & rhythm and no murmurs and no lower extremity edema ABDOMEN: abdomen soft, non-tender and normal bowel sounds MUSCULOSKELETAL: no cyanosis of digits and no clubbing  PSYCH: alert & oriented x 3 with fluent speech NEURO: no focal motor/sensory deficits   All questions were answered. The patient knows to call the clinic with any problems, questions or concerns.   The total time spent in the appointment was 40 minutes encounter with patient including review of chart and various tests results, discussions about plan of care and coordination of care plan  Olam JINNY Brunner, NP 02/20/2024 1:30 PM    Labs Reviewed:  Lab Results  Component Value Date   WBC 7.8 02/20/2024   HGB 10.8 (L) 02/20/2024  HCT 32.4 (L) 02/20/2024   MCV 91.3 02/20/2024   PLT 28 (LL) 02/20/2024   Recent Labs    02/07/24 1015 02/18/24 1738 02/19/24 0339 02/20/24 0428  NA 135 133* 137 139  K 4.0 4.3 3.8 4.0  CL 100 101 109 111  CO2 27 16* 19* 18*  GLUCOSE 167*  272* 180* 127*  BUN 33* 62* 52* 44*  CREATININE 0.60 1.45* 1.01* 0.85  CALCIUM  8.6* 8.6* 8.3* 8.1*  GFRNONAA >60 36* 56* >60  PROT 6.7 5.5* 4.8*  --   ALBUMIN 3.3* 1.7* <1.5*  --   AST 20 44* 30  --   ALT 46* 34 29  --   ALKPHOS 76 128* 108  --   BILITOT 0.3 0.6 0.8  --   BILIDIR  --   --  0.3*  --   IBILI  --   --  0.5  --     Studies Reviewed:  DG Chest Port 1 View Result Date: 02/18/2024 CLINICAL DATA:  Shortness of breath and coughing. EXAM: PORTABLE CHEST 1 VIEW COMPARISON:  01/25/2024. FINDINGS: The heart size and mediastinal contours are stable. There is atherosclerotic calcification of the aorta. A right chest port appear stable in position. Emphysematous changes are noted in the lungs. Patchy airspace disease is noted at the lung bases. No effusion or pneumothorax is seen. The previously described right lower lobe mass is not distinctly seen on this exam. No acute osseous abnormality. IMPRESSION: 1. Patchy airspace disease at the lung bases, suspicious for pneumonia. 2. Previously described right lower lobe mass is not seen due to overlying densities. 3. Emphysema. Electronically Signed   By: Leita Birmingham M.D.   On: 02/18/2024 18:10   MR BRAIN W WO CONTRAST Result Date: 01/27/2024 CLINICAL DATA:  83 year old female with lung cancer, biparietal brain metastases on recent MRI. 3 Tesla staging. Left side subdural hematoma. EXAM: MRI HEAD WITHOUT AND WITH CONTRAST TECHNIQUE: Multiplanar, multiecho pulse sequences of the brain and surrounding structures were obtained without and with intravenous contrast. CONTRAST:  4mL GADAVIST  GADOBUTROL  1 MMOL/ML IV SOLN COMPARISON:  Brain MRI 01/25/2024 and earlier. FINDINGS: Brain: Posterior left hemisphere parieto-occipital sulcus level rim enhancing mass is 3.5 cm long axis (series 1100, image 211). Abundant hemosiderin associated with this lesion. Smaller right inferior parietal lobe more solidly rim enhancing mass is 17 mm long axis (series 1100,  image 235). Minimal hemosiderin associated. Smooth and mild dural thickening and enhancement associated with left-side SDH (see below), most apparent on coronal postcontrast. No other no other abnormal intracranial enhancement identified Posterior hemisphere vasogenic edema affecting both parietal lobes, left occipital lobe has not significantly changed. Stable mass effect on the posterolateral ventricles, more so the left. Superimposed small left side subdural hematoma is stable measuring 2-3 mm. No IVH or other acute intracranial hemorrhage. There is punctate chronic microhemorrhage just anterior to the right parietal metastasis, stable. Basilar cisterns remain normal. No ventriculomegaly. No restricted diffusion to suggest acute infarction. Cervicomedullary junction and pituitary are within normal limits. Stable gray and white matter signal elsewhere including patchy T2 and FLAIR hyperintensity in the pons. Vascular: Major intracranial vascular flow voids are stable. Skull and upper cervical spine: Visualized bone marrow signal is within normal limits. Negative for age visible cervical spine. Sinuses/Orbits: Stable, negative. Other: Mastoids well aerated. Visible internal auditory structures appear normal. IMPRESSION: 1. Re-demonstrated biparietal metastases with stable vasogenic edema. No new metastatic disease identified. 2. Stable small left side Subdural Hematoma (2-3 mm). Mild associated dural  thickening and enhancement. No midline shift. 3. No new intracranial abnormality. Electronically Signed   By: VEAR Hurst M.D.   On: 01/27/2024 04:12   CT HEAD WO CONTRAST ( ) Result Date: 01/26/2024 CLINICAL DATA:  83 year old female with abnormal head CT and brain MRI yesterday, multiple recent falls. Brain metastases suspected on MRI, with small left hemisphere subdural hematoma also. EXAM: CT HEAD WITHOUT CONTRAST TECHNIQUE: Contiguous axial images were obtained from the base of the skull through the vertex  without intravenous contrast. RADIATION DOSE REDUCTION: This exam was performed according to the departmental dose-optimization program which includes automated exposure control, adjustment of the mA and/or kV according to patient size and/or use of iterative reconstruction technique. COMPARISON:  Brain MRI and head CT yesterday. FINDINGS: Brain: Mixed density but mostly hyperdense left side subdural hematoma is 4 mm on series 2, image 16 and appears stable. No new intracranial hemorrhage is identified. But there are biparietal mass lesions redemonstrated, with mild hyperdense blood products associated with the larger left side lesion as before. That mass was up to 3.5 cm by MRI. Regional vasogenic edema in the posterior hemispheres appears stable from yesterday. Stable associated relatively mild intracranial mass effect, mass effect on the atria of the lateral ventricle and trace rightward midline shift. No ventriculomegaly. Basilar cisterns remain patent. No new cerebral edema identified. Vascular: Calcified atherosclerosis at the skull base. No suspicious intracranial vascular hyperdensity. Skull: No fracture identified. Sinuses/Orbits: Visualized paranasal sinuses and mastoids are stable and well aerated. Other: Mild left posterosuperior scalp hematoma or contusion suspected on series 3, image 61. Underlying calvarium intact. No scalp soft tissue gas. Visualized orbit soft tissues are within normal limits. IMPRESSION: 1. Stable since yesterday: - 4 mm Left Side Subdural Hematoma. Overlying left scalp hematoma but no skull fracture identified. - heterogeneous brain metastases in both parietal lobes, larger on the left. Regional cytotoxic edema. 2. Stable mild intracranial mass effect, trace rightward midline shift. 3. No new intracranial abnormality. Electronically Signed   By: VEAR Hurst M.D.   On: 01/26/2024 07:16   CT CHEST ABDOMEN PELVIS W CONTRAST Result Date: 01/25/2024 CLINICAL DATA:  Lung cancer frequent  fall EXAM: CT CHEST, ABDOMEN, AND PELVIS WITH CONTRAST TECHNIQUE: Multidetector CT imaging of the chest, abdomen and pelvis was performed following the standard protocol during bolus administration of intravenous contrast. RADIATION DOSE REDUCTION: This exam was performed according to the departmental dose-optimization program which includes automated exposure control, adjustment of the mA and/or kV according to patient size and/or use of iterative reconstruction technique. CONTRAST:  40mL OMNIPAQUE  IOHEXOL  350 MG/ML SOLN COMPARISON:  CT 11/11/2023, 09/09/2023, PET CT 05/19/2023, chest CT 08/15/2023, 07/04/2023 FINDINGS: CT CHEST FINDINGS Cardiovascular: Moderate aortic atherosclerosis. No aneurysm. Normal cardiac size. Coronary vascular calcification. No pericardial effusion. Right-sided central venous port tip at the distal SVC. Mediastinum/Nodes: Patent trachea. No thyroid  mass. Pretracheal lymph node on series 3, image 25 measures 8 mm, previously 8 mm. Similar subcentimeter precarinal lymph node. Esophagus within normal limits. Lungs/Pleura: Advanced emphysema. Spiculated right lower lobe pulmonary mass measures about 3 x 2.6 cm on series 4, image 133, previously 33 x 28 mm. Mucoid impaction within slightly dilated right lower lobe bronchi proximal to the mass. Previously noted medial right lower lobe subpleural focus of consolidation is largely resolved, however interval multifocal bilateral heterogeneous consolidations within the bilateral upper lobes, subpleural right middle lobe and the right lower lobe. Right lower lobe lung mass is contiguous with consolidative process posteriorly that extends to the pleural surface.  Left lower lobe subpleural nodularity measuring about 6 mm on series 4, image 126, previously 6 mm. Subpleural left upper lobe nodularity measures about 14 x 7 mm on series 4, image 43, previously 13 x 7 mm. Right apical scarring. No pleural effusion or pneumothorax. New left upper lobe 7 mm  ground-glass nodule, series 4, image 103. Possible small spiculated 6 mm focus series 4 image 103. Musculoskeletal: No acute or suspicious osseous abnormality. CT ABDOMEN PELVIS FINDINGS Hepatobiliary: Gallstone. No focal hepatic abnormality. No biliary dilatation. Pancreas: Unremarkable. No pancreatic ductal dilatation or surrounding inflammatory changes. Spleen: Normal in size without focal abnormality. Adrenals/Urinary Tract: Adrenal glands are within normal limits. Kidneys show no hydronephrosis. Limited assessment of the collecting systems due to excreted contrast. The bladder is unremarkable aside from dilute contrast. Stomach/Bowel: Stomach nonenlarged. No dilated small bowel. No acute bowel wall thickening. Vascular/Lymphatic: Advanced calcific aortoiliac disease. No aneurysm. No suspicious lymph nodes. Reproductive: Hysterectomy.  No adnexal mass Other: Negative for pelvic effusion or free air Musculoskeletal: Left hip replacement. AVN of the right femoral head without collapse. No acute osseous abnormality IMPRESSION: 1. Advanced emphysema. Spiculated right lower lobe pulmonary mass measures about 3 x 2.6 cm, stable to slightly diminished, previously 33 x 28 mm. Previously noted medial right lower lobe subpleural focus of consolidation is largely resolved, however interval multifocal bilateral heterogeneous consolidations within the bilateral upper lobes, subpleural right middle lobe and the right lower lobe. Few new ground-glass pulmonary nodules, most evident in the left upper lobe. Findings are favored to be infectious or inflammatory in etiology but follow-up chest CT recommended as metastatic nodules could potentially be obscured by consolidative airspace disease and for follow-up of the new small left upper lobe pulmonary nodules which are indeterminate in origin. 2. No CT evidence for metastatic disease within the abdomen or pelvis. 3. Gallstone. Aortic Atherosclerosis (ICD10-I70.0) and Emphysema  (ICD10-J43.9). Electronically Signed   By: Luke Bun M.D.   On: 01/25/2024 20:02   MR Brain W and Wo Contrast Result Date: 01/25/2024 CLINICAL DATA:  Brain/CNS neoplasm, assess treatment response. Multiple recent falls. EXAM: MRI HEAD WITHOUT AND WITH CONTRAST TECHNIQUE: Multiplanar, multiecho pulse sequences of the brain and surrounding structures were obtained without and with intravenous contrast. CONTRAST:  5mL GADAVIST  GADOBUTROL  1 MMOL/ML IV SOLN COMPARISON:  Head CT 01/25/2024 and MRI 05/13/2023 FINDINGS: Brain: As seen on today's earlier head CT, there are bilateral parietal masses with the left being hemorrhagic. Both lesions demonstrate irregular peripheral enhancement and measure 1.6 x 1.5 cm on the right and 3.5 x 2.7 cm on the left. There is associated moderately extensive vasogenic edema in the left greater than right posterior cerebral hemispheres with mild mass effect on the left lateral ventricle. A small subdural hematoma over the left lateral cerebral convexity measuring up to 5 mm in thickness is unchanged from today's CT. There is no significant midline shift. Small T2 hyperintensities elsewhere in the cerebral white matter and pons are nonspecific but compatible with mild chronic small vessel ischemic disease with changes in the pons having progressed from the prior MRI. No acute infarct or hydrocephalus is evident. There is mild cerebral atrophy. Vascular: Major intracranial vascular flow voids are preserved. Skull and upper cervical spine: No suspicious marrow lesion. Sinuses/Orbits: Bilateral cataract extraction. Paranasal sinuses and mastoid air cells are clear. Other: Left-sided scalp soft tissue swelling. IMPRESSION: 1. Bilateral parietal masses most concerning for metastatic disease. Moderately extensive vasogenic edema without midline shift. 2. Unchanged small left cerebral convexity subdural  hematoma. 3. Mild chronic small vessel ischemic disease. Electronically Signed   By:  Dasie Hamburg M.D.   On: 01/25/2024 18:53   CT Cervical Spine Wo Contrast Result Date: 01/25/2024 CLINICAL DATA:  Neck trauma (Age >= 65y) EXAM: CT CERVICAL SPINE WITHOUT CONTRAST TECHNIQUE: Multidetector CT imaging of the cervical spine was performed without intravenous contrast. Multiplanar CT image reconstructions were also generated. RADIATION DOSE REDUCTION: This exam was performed according to the departmental dose-optimization program which includes automated exposure control, adjustment of the mA and/or kV according to patient size and/or use of iterative reconstruction technique. COMPARISON:  None Available. FINDINGS: Alignment: No subluxation. Slight degenerative anterolisthesis of C3 on C4 and retrolisthesis of C5 on C6. Skull base and vertebrae: No acute fracture. No primary bone lesion or focal pathologic process. Soft tissues and spinal canal: No prevertebral fluid or swelling. No visible canal hematoma. Disc levels: Moderate degenerative disc disease in the lower cervical spine. Advanced bilateral degenerative facet disease diffusely. Multilevel bilateral neural foraminal narrowing. Focal disc herniation centrally at C5-6 causing central spinal stenosis. Upper chest: Biapical scarring.  Emphysema. Other: None IMPRESSION: Degenerative disc and facet disease as described above. Central disc herniation at C5-6 with central spinal stenosis. No acute bony abnormality. Electronically Signed   By: Franky Crease M.D.   On: 01/25/2024 15:33   CT Head Wo Contrast Result Date: 01/25/2024 CLINICAL DATA:  Head trauma, moderate-severe EXAM: CT HEAD WITHOUT CONTRAST TECHNIQUE: Contiguous axial images were obtained from the base of the skull through the vertex without intravenous contrast. RADIATION DOSE REDUCTION: This exam was performed according to the departmental dose-optimization program which includes automated exposure control, adjustment of the mA and/or kV according to patient size and/or use of  iterative reconstruction technique. COMPARISON:  None Available. FINDINGS: Brain: There appear to be bilateral posterior parietal mass lesions, measuring approximately 2.7 cm on the left with areas of hemorrhage noted, and measuring approximately 1.3 cm on the right. Surrounding vasogenic edema in both parietal lobes. Also noted is a small left subdural hematoma measuring 4-5 mm in thickness. No significant mass effect or midline shift. No hydrocephalus. Vascular: No hyperdense vessel or unexpected calcification. Skull: No acute calvarial abnormality. Sinuses/Orbits: No acute findings Other: None IMPRESSION: Concern for bilateral posterior parietal mass lesions, left larger than right. Associated areas of hemorrhage within and surrounding the left posterior parietal mass lesion along with 4-5 mm left subdural hematoma. Associated vasogenic edema throughout the parietal lobes bilaterally. Critical Value/emergent results were called by telephone at the time of interpretation on 01/25/2024 at 3:29 pm to provider WHITNEY PLUNKETT , who verbally acknowledged these results. Electronically Signed   By: Franky Crease M.D.   On: 01/25/2024 15:29   DG Chest 2 View Result Date: 01/25/2024 CLINICAL DATA:  Recurrent falls. EXAM: CHEST - 2 VIEW COMPARISON:  11/13/2022. FINDINGS: Bilateral lungs appear hyperexpanded and hyperlucent with coarse bronchovascular markings, in keeping with COPD. Bilateral lungs otherwise appear clear. No dense consolidation or lung collapse. Bilateral costophrenic angles are clear. Normal cardio-mediastinal silhouette. No acute osseous abnormalities. The soft tissues are within normal limits. Right-sided CT Port-A-Cath is seen with its tip overlying the cavoatrial junction region. IMPRESSION: No active cardiopulmonary disease. COPD. Electronically Signed   By: Ree Molt M.D.   On: 01/25/2024 13:22

## 2024-02-20 NOTE — Progress Notes (Signed)
 Chaplain responded to spiritual consult request to help pt and family complete an AD. All questions answered. Chaplains remain available when pt is ready to sign and for any other needs as they arise.

## 2024-02-20 NOTE — Progress Notes (Signed)
 [facility]                                                                               Nicholle Falzon, is a 83 y.o. female, DOB - 1940-08-24, FMW:[id number] Admit date - 02/18/2024    Outpatient Primary MD for the patient is Benjamine Aland, MD  LOS - 2  days    Brief summary    Kristy Salazar is an 83 y.o. female with medical history significant for COPD and lung cancer with brain metastases who presents with shortness of breath and cough.  She was admitted for CAP.    Assessment & Plan    Assessment and Plan:   Severe sepsis d/t pneumonia and E COLI bacteremia  in the setting of  COPD exacerbation; Acute hypoxic respiratory failure  Continue with IV antibiotics.  Trend lactate,  continue with rocephin  and doxycycline .  [place] oxygen to keep sats greater than 90%.  On prednisone  taper to complete the course.  Repeat blood cultures ordered and pending.  Lactic acid improving.  Wbc count wnl.  Currently requiring [place] oxygen to keep sats greater than 90%.  Wean her off oxygen.     2. AKI  - creatinine much improved after IV fluids.    3. Thrombocytopenia  Possible from chemotherapy last month.  Transfuse to keep platelets greater than 20,000.    4. Lung cancer  - S/p chemoradiation followed by immunotherapy under the care of Dr. Sherrod; receiving [facility] for brain mets     5. Type II DM  CBG (last 3)  Recent Labs    02/19/24 1957 02/20/24 0557 02/20/24 1243  GLUCAP 138* 127* 170*    Get A1c/  Resume SSI.      6. Elevated troponin  - Mildly elevated and flat without anginal complaints and in setting of severe sepsis likely reflects demand ischemia  - currently denies any chest pain.    7. Goals of care  - palliative care consulted for GOC.   8. Thrush On diflucan . Nystatin  added.   Therapy eval recommending SNF.      Estimated body mass index is 13.93 kg/m as calculated from the following:   Height as of this encounter: 5' 11 (1.803  m).   Weight as of this encounter: 45.3 kg.  Code Status: DNR  DVT Prophylaxis:  SCDs Start: 02/18/24 2029   Level of Care: Level of care: Progressive Family Communication: family at bedside.   Disposition Plan:     Remains inpatient appropriate:  pending clinical improvement.   Procedures:  None.   Consultants:   Palliative.   Antimicrobials:   Anti-infectives (From admission, onward)    Start     Dose/Rate Route Frequency Ordered Stop   02/20/24 1000  fluconazole  (DIFLUCAN ) tablet 200 mg        200 mg Oral Daily 02/19/24 1054     02/19/24 2200  doxycycline  (VIBRA -TABS) tablet 100 mg        100 mg Oral Every 12 hours 02/19/24 1717 02/24/24 2159   02/19/24 1800  ceFEPIme  (MAXIPIME ) 2 g in sodium chloride  0.9 % 100 mL IVPB  Status:  Discontinued  2 g 200 mL/hr over 30 Minutes Intravenous Every 24 hours 02/18/24 2037 02/19/24 0616   02/19/24 1000  fluconazole  (DIFLUCAN ) tablet 200 mg        200 mg Oral  Once 02/19/24 0909 02/19/24 1034   02/19/24 0630  doxycycline  (VIBRAMYCIN ) 100 mg in sodium chloride  0.9 % 250 mL IVPB  Status:  Discontinued        100 mg 125 mL/hr over 120 Minutes Intravenous Every 12 hours 02/18/24 2032 02/19/24 0615   02/19/24 0630  cefTRIAXone  (ROCEPHIN ) 2 g in sodium chloride  0.9 % 100 mL IVPB        2 g 200 mL/hr over 30 Minutes Intravenous Every 24 hours 02/19/24 0616 02/24/24 0629   02/18/24 1830  ceFEPIme  (MAXIPIME ) 2 g in sodium chloride  0.9 % 100 mL IVPB        2 g 200 mL/hr over 30 Minutes Intravenous  Once 02/18/24 1816 02/18/24 2023   02/18/24 1830  doxycycline  (VIBRA -TABS) tablet 100 mg        100 mg Oral  Once 02/18/24 1816 02/18/24 2034        Medications  Scheduled Meds:  doxycycline   100 mg Oral Q12H   fluconazole   200 mg Oral Daily   insulin  aspart  0-5 Units Subcutaneous QHS   insulin  aspart  0-6 Units Subcutaneous TID WC   nystatin   5 mL Oral QID   [START ON 02/24/2024] predniSONE   20 mg Oral Q breakfast   Followed by    NOREEN ON 02/26/2024] predniSONE   10 mg Oral Q breakfast   Followed by   NOREEN ON 02/28/2024] predniSONE   5 mg Oral Q breakfast   predniSONE   40 mg Oral Q breakfast   sodium chloride  flush  3 mL Intravenous Q12H   Continuous Infusions:  cefTRIAXone  (ROCEPHIN )  IV 2 g (02/20/24 0548)   PRN Meds:.acetaminophen  **OR** acetaminophen , fentaNYL  (SUBLIMAZE ) injection, ipratropium-albuterol , oxyCODONE , prochlorperazine , senna-docusate    Subjective:   Elowyn Raupp was seen and examined today. No chest pain   Objective:   Vitals:   02/20/24 0400 02/20/24 0550 02/20/24 0811 02/20/24 1131  BP: 100/67  114/69 101/61  Pulse: 85  84 90  Resp: 18  20 (!) 24  Temp: 98.8 F (37.1 C)  97.6 F (36.4 C) 98.2 F (36.8 C)  TempSrc: Oral  Oral Oral  SpO2: 93%  93% 94%  Weight:  45.3 kg    Height:       No intake or output data in the 24 hours ending 02/20/24 1432  Filed Weights   02/18/24 2300 02/20/24 0550  Weight: 47.5 kg 45.3 kg     Exam General exam: Appears calm and comfortable  Respiratory system: diminished at bases on [place] [employer].  Cardiovascular system: S1 & S2 heard, RRR.  Gastrointestinal system: Abdomen is nondistended, soft and nontender.  Central nervous system: Alert , oriented to person and place.  Extremities: Symmetric 5 x 5 power. Skin: No rashes,  Psychiatry: Mood & affect appropriate.     Data Reviewed:  I have personally reviewed following labs and imaging studies   CBC Lab Results  Component Value Date   WBC 7.8 02/20/2024   RBC 3.55 (L) 02/20/2024   HGB 10.8 (L) 02/20/2024   HCT 32.4 (L) 02/20/2024   MCV 91.3 02/20/2024   MCH 30.4 02/20/2024   PLT 28 (LL) 02/20/2024   MCHC 33.3 02/20/2024   RDW 19.4 (H) 02/20/2024   LYMPHSABS 0.1 (L) 02/18/2024   MONOABS 0.1 02/18/2024  EOSABS 0.0 02/18/2024   BASOSABS 0.0 02/18/2024     Last metabolic panel Lab Results  Component Value Date   NA 139 02/20/2024   K 4.0 02/20/2024   CL 111 02/20/2024    CO2 18 (L) 02/20/2024   BUN 44 (H) 02/20/2024   CREATININE 0.85 02/20/2024   GLUCOSE 127 (H) 02/20/2024   GFRNONAA >60 02/20/2024   GFRAA >90 05/10/2011   CALCIUM  8.1 (L) 02/20/2024   PHOS 1.7 (L) 09/15/2023   PROT 4.8 (L) 02/19/2024   ALBUMIN <1.5 (L) 02/19/2024   BILITOT 0.8 02/19/2024   ALKPHOS 108 02/19/2024   AST 30 02/19/2024   ALT 29 02/19/2024   ANIONGAP 10 02/20/2024    CBG (last 3)  Recent Labs    02/19/24 1957 02/20/24 0557 02/20/24 1243  GLUCAP 138* 127* 170*      Coagulation Profile: No results for input(s): INR, PROTIME in the last 168 hours.   Radiology Studies: DG Chest Port 1 View Result Date: 02/18/2024 CLINICAL DATA:  Shortness of breath and coughing. EXAM: PORTABLE CHEST 1 VIEW COMPARISON:  01/25/2024. FINDINGS: The heart size and mediastinal contours are stable. There is atherosclerotic calcification of the aorta. A right chest port appear stable in position. Emphysematous changes are noted in the lungs. Patchy airspace disease is noted at the lung bases. No effusion or pneumothorax is seen. The previously described right lower lobe mass is not distinctly seen on this exam. No acute osseous abnormality. IMPRESSION: 1. Patchy airspace disease at the lung bases, suspicious for pneumonia. 2. Previously described right lower lobe mass is not seen due to overlying densities. 3. Emphysema. Electronically Signed   By: Leita Birmingham M.D.   On: 02/18/2024 18:10       Elgie Butter M.D. [facility] 02/20/2024, 2:32 PM  Available via Epic secure chat 7am-7pm After 7 pm, please refer to night coverage provider listed on amion.

## 2024-02-20 NOTE — Progress Notes (Signed)
 Palliative Medicine Inpatient Follow Up Note HPI: Kristy Salazar is an 83 y.o. female with medical history significant for COPD, HTN, arthritis, and lung cancer with brain metastases who presents with shortness of breath and cough. Palliative care has been asked to support additional goals of care conversations.   Today's Discussion 02/20/2024  *Please note that this is a verbal dictation therefore any spelling or grammatical errors are due to the Dragon Medical One system interpretation.  Chart reviewed inclusive of vital signs, progress notes, laboratory results, and diagnostic images.  I met with Kristy Salazar at bedside this afternoon in the presence of her granddaughter,  Rolin and surrogate daughter Levon.   We discussed that Kristy Salazar is being treated for pneumonia at this time, thus far she endorses improvement but limited appetite. She shares that she does like liquids though. We reviewed the oral candidal infection that she has presently and how this is being treated. Discussed options to increase appetite.  We reviewed the conversations held with the Oncology team. We discussed the option(s) of continuing present treatments to see overall how well she is able to improve and being evaluated for additional treatment(s) versus hospice care.   I described hospice as a service for patients who have a life expectancy of 6 months or less. The goal of hospice is the preservation of dignity and quality at the end phases of life. Under hospice care, the focus changes from curative to symptom relief.   We discussed the importance of conversation(s) with family regarding Kristy Salazar's wishes moving into the future.   ________________________________________________  I called and spoke to patients daughter, Kristy Salazar. I was able to share the above conversation with her. She plans to come and speak to her mother tonight to gain better insights on what her wishes are.   Questions and concerns  addressed/Palliative Support Provided.   Objective Assessment: Vital Signs Vitals:   02/20/24 0811 02/20/24 1131  BP: 114/69 101/61  Pulse: 84 90  Resp: 20 (!) 24  Temp: 97.6 F (36.4 C) 98.2 F (36.8 C)  SpO2: 93% 94%   No intake or output data in the 24 hours ending 02/20/24 1546 Last Weight  Most recent update: 02/20/2024  5:51 AM    Weight  45.3 kg (99 lb 13.9 oz)            Gen: Elderly African-American female chronically ill in appearance HEENT: White patches on tongue, dry mucous membranes CV: Regular rate and irregular rhythm  PULM: On 2L nasal cannula breathing is even and nonlabored ABD: soft/nontender  EXT: Muscle wasting noted in all 4 extremities Neuro: Alert and oriented x3   SUMMARY OF RECOMMENDATIONS   DNAR/DNI   Chaplain to help with AD's   Continue present measures and treatment for pneumonia   Appreciate Oncology involvement - Have spoken to family about following up with OP Oncology to see if [name] is a candidate for any additional treatments verus Hospice   Continue Diflucan  for oral thrush  Nutrition consulted for supplemental recommendations   Ongoing palliative care support ______________________________________________________________________________________ Rosaline Becton [facility] Palliative Medicine Team Team Cell Phone: [phone] Please utilize secure chat with additional questions, if there is no response within 30 minutes please call the above phone number  Billing based on MDM: High  Palliative Medicine Team providers are available by phone from 7am to 7pm daily and can be reached through the team cell phone.  Should this patient require assistance outside of these hours, please call the patient's  attending physician.

## 2024-02-21 ENCOUNTER — Ambulatory Visit

## 2024-02-21 ENCOUNTER — Ambulatory Visit: Admitting: Nurse Practitioner

## 2024-02-21 ENCOUNTER — Other Ambulatory Visit

## 2024-02-21 ENCOUNTER — Inpatient Hospital Stay: Admitting: Dietician

## 2024-02-21 DIAGNOSIS — A4151 Sepsis due to Escherichia coli [E. coli]: Secondary | ICD-10-CM

## 2024-02-21 DIAGNOSIS — E43 Unspecified severe protein-calorie malnutrition: Secondary | ICD-10-CM | POA: Insufficient documentation

## 2024-02-21 DIAGNOSIS — R7989 Other specified abnormal findings of blood chemistry: Secondary | ICD-10-CM | POA: Diagnosis not present

## 2024-02-21 DIAGNOSIS — N179 Acute kidney failure, unspecified: Secondary | ICD-10-CM | POA: Diagnosis not present

## 2024-02-21 DIAGNOSIS — R6520 Severe sepsis without septic shock: Secondary | ICD-10-CM

## 2024-02-21 LAB — CBC
HCT: 35.4 % — ABNORMAL LOW (ref 36.0–46.0)
Hemoglobin: 11.6 g/dL — ABNORMAL LOW (ref 12.0–15.0)
MCH: 30.6 pg (ref 26.0–34.0)
MCHC: 32.8 g/dL (ref 30.0–36.0)
MCV: 93.4 fL (ref 80.0–100.0)
Platelets: 26 K/uL — CL (ref 150–400)
RBC: 3.79 MIL/uL — ABNORMAL LOW (ref 3.87–5.11)
RDW: 19.4 % — ABNORMAL HIGH (ref 11.5–15.5)
WBC: 9.3 K/uL (ref 4.0–10.5)
nRBC: 0 % (ref 0.0–0.2)

## 2024-02-21 LAB — BASIC METABOLIC PANEL WITH GFR
Anion gap: 10 (ref 5–15)
BUN: 53 mg/dL — ABNORMAL HIGH (ref 8–23)
CO2: 17 mmol/L — ABNORMAL LOW (ref 22–32)
Calcium: 8.3 mg/dL — ABNORMAL LOW (ref 8.9–10.3)
Chloride: 111 mmol/L (ref 98–111)
Creatinine, Ser: 0.84 mg/dL (ref 0.44–1.00)
GFR, Estimated: >60 mL/min (ref >60)
Glucose, Bld: 146 mg/dL — ABNORMAL HIGH (ref 70–99)
Potassium: 4.1 mmol/L (ref 3.5–5.1)
Sodium: 138 mmol/L (ref 135–145)

## 2024-02-21 LAB — CULTURE, BLOOD (ROUTINE X 2)

## 2024-02-21 LAB — GLUCOSE, CAPILLARY
Glucose-Capillary: 131 mg/dL — ABNORMAL HIGH (ref 70–99)
Glucose-Capillary: 208 mg/dL — ABNORMAL HIGH (ref 70–99)
Glucose-Capillary: 161 mg/dL — ABNORMAL HIGH (ref 70–99)
Glucose-Capillary: 167 mg/dL — ABNORMAL HIGH (ref 70–99)

## 2024-02-21 MED ORDER — ENSURE PLUS HIGH PROTEIN PO LIQD
237.0000 mL | Freq: Four times a day (QID) | ORAL | Status: DC
  Administered 2024-02-21 – 2024-02-22 (×8): 237 mL via ORAL

## 2024-02-21 MED ORDER — CEFADROXIL 500 MG PO CAPS
1000.0000 mg | ORAL_CAPSULE | Freq: Two times a day (BID) | ORAL | Status: DC
  Administered 2024-02-22 (×2): 1000 mg via ORAL
  Filled 2024-02-21: qty 2

## 2024-02-21 NOTE — Progress Notes (Signed)
 Palliative Medicine Inpatient Follow Up Note HPI: Kristy Salazar is an 83 y.o. female with medical history significant for COPD, HTN, arthritis, and lung cancer with brain metastases who presents with shortness of breath and cough. Palliative care has been asked to support additional goals of care conversations.   Today's Discussion 02/21/2024  *Please note that this is a verbal dictation therefore any spelling or grammatical errors are due to the Dragon Medical One system interpretation.  Chart reviewed inclusive of vital signs, progress notes, laboratory results, and diagnostic images.  I met with Mandy at bedside this morning in the company of her daughter, [name] and granddaughter Jasmine.   We discussed patient's prednisone  clinical situation and the improvement from her pneumonia.  We reviewed from an oncological perspective the options moving forward inclusive of either continuing present treatment and following up as an outpatient with Dr. Sherrod versus transitioning home with hospice care.  We reviewed again, the role of hospice in patients who have advanced incurable cancer.  We reviewed that hospice provides dignity, quality, and comfort at the end of life.  Nolan shares with me that her wishes are to be in her home.  She shares with me that the food is better at home and she likes being surrounded by her loved ones.  Areya shares she does not see the point in continuing cancer therapies if they are can a help her.  Lisbeth is clear about what her wishes are as we discussed it multiple times to verify understanding and assert this is the decision that she would like to make.  Both patient's daughter [name] and granddaughter Jasmine were present and are in agreement with what Delsie would like.  Patient's family prefer to use hospice services through [facility].  I shared I would discuss this with the transitions of care case manager.  Patient's family and Anjolie share readiness to complete the  advanced directive documents.  I shared that I would speak to the chaplain support services to have these finalize prior to discharge.  Questions and concerns addressed/Palliative Support Provided.   Objective Assessment: Vital Signs Vitals:   02/21/24 0825 02/21/24 1107  BP: 132/80 (!) 125/91  Pulse: 96 88  Resp: 16 (!) 28  Temp: 97.9 F (36.6 C) (!) 97.4 F (36.3 C)  SpO2: 94% 100%    Intake/Output Summary (Last 24 hours) at 02/21/2024 1115 Last data filed at 02/21/2024 9068 Gross per 24 hour  Intake 300 ml  Output 450 ml  Net -150 ml   Last Weight  Most recent update: 02/21/2024  6:46 AM    Weight  46.8 kg (103 lb 2.8 oz)            Gen: Elderly African-American female chronically ill in appearance HEENT: White patches on tongue, dry mucous membranes CV: Regular rate and irregular rhythm  PULM: On room air, breathing is even and nonlabored ABD: soft/nontender  EXT: Muscle wasting noted in all 4 extremities Neuro: Alert and oriented x3   SUMMARY OF RECOMMENDATIONS   DNAR/DNI  Goal DNR form has been placed at the front of the chart completed   Chaplain to help with AD's  Continue Diflucan  for oral thrush  Nutrition involvement for supplemental recommendations  Patient has decided to transition home with hospice --> patient's family prefer [facility] hospice  Appreciate the transitions of care team coordinating home hospice services   Ongoing palliative care support ______________________________________________________________________________________ Rosaline Becton [facility] Palliative Medicine Team Team Cell Phone: [phone] Please utilize  secure chat with additional questions, if there is no response within 30 minutes please call the above phone number  Billing based on MDM: High  Palliative Medicine Team providers are available by phone from 7am to 7pm daily and can be reached through the team cell phone.  Should this patient require assistance  outside of these hours, please call the patient's attending physician.

## 2024-02-21 NOTE — Progress Notes (Signed)
 [facility]                                                                               Kristy Salazar, is a 83 y.o. female, DOB - 11-06-1940, FMW:[id number] Admit date - 02/18/2024    Outpatient Primary MD for the patient is Benjamine Aland, MD  LOS - 3  days    Brief summary    Kristy Salazar is an 83 y.o. female with medical history significant for COPD and lung cancer with brain metastases who presents with shortness of breath and cough.  She was admitted for CAP.    Assessment & Plan    Assessment and Plan:   Severe sepsis d/t pneumonia and E COLI bacteremia  in the setting of  COPD exacerbation; Acute hypoxic respiratory failure  She was started on IV antibiotics and plan to transition to oral antibiotics in am to complete the course.  [place] oxygen to keep sats greater than 90%.  Lactic acid improving.  Wbc count wnl.  Currently requiring [place] oxygen to keep sats greater than 90%.      2. AKI  - resolved.  Creatinine back to baseline after IV fluids.    3. Thrombocytopenia  Possible from chemotherapy last month.  Transfuse to keep platelets greater than 20,000.    4. Lung cancer  - S/p chemoradiation followed by immunotherapy under the care of Dr. Sherrod; receiving [facility] for brain mets   Palliative care consulted and after discussing with oncology and patient's family. She is transitioned to hospice care.  Plan to discharge to [facility] hospice/ residential hospice.    5. Type II DM  CBG (last 3)  Recent Labs    02/21/24 0604 02/21/24 1111 02/21/24 1617  GLUCAP 131* 208* 161*   Resume ssi.      6. Elevated troponin  - Mildly elevated and flat without anginal complaints and in setting of severe sepsis likely reflects demand ischemia  - currently denies any chest pain.    7. Goals of care  - palliative care consulted for GOC. Working on a bed at hospice at [facility].   8. Thrush On diflucan . Nystatin  added.        Estimated body mass  index is 14.39 kg/m as calculated from the following:   Height as of this encounter: 5' 11 (1.803 m).   Weight as of this encounter: 46.8 kg.  Code Status: DNR  DVT Prophylaxis:  SCDs Start: 02/18/24 2029   Level of Care: Level of care: Med-Surg Family Communication: family at bedside.   Disposition Plan:     Remains inpatient appropriate:  hospice at [facility] when bed available.   Procedures:  None.   Consultants:   Palliative.   Antimicrobials:   Anti-infectives (From admission, onward)    Start     Dose/Rate Route Frequency Ordered Stop   02/22/24 1000  cefadroxil  (DURICEF) capsule 1,000 mg        1,000 mg Oral 2 times daily 02/21/24 1317 02/26/24 0959   02/20/24 1000  fluconazole  (DIFLUCAN ) tablet 200 mg        200 mg Oral Daily 02/19/24 1054     02/19/24 2200  doxycycline  (VIBRA -TABS) tablet 100 mg        100 mg Oral Every 12 hours 02/19/24 1717 02/24/24 2159   02/19/24 1800  ceFEPIme  (MAXIPIME ) 2 g in sodium chloride  0.9 % 100 mL IVPB  Status:  Discontinued        2 g 200 mL/hr over 30 Minutes Intravenous Every 24 hours 02/18/24 2037 02/19/24 0616   02/19/24 1000  fluconazole  (DIFLUCAN ) tablet 200 mg        200 mg Oral  Once 02/19/24 0909 02/19/24 1034   02/19/24 0630  doxycycline  (VIBRAMYCIN ) 100 mg in sodium chloride  0.9 % 250 mL IVPB  Status:  Discontinued        100 mg 125 mL/hr over 120 Minutes Intravenous Every 12 hours 02/18/24 2032 02/19/24 0615   02/19/24 0630  cefTRIAXone  (ROCEPHIN ) 2 g in sodium chloride  0.9 % 100 mL IVPB  Status:  Discontinued        2 g 200 mL/hr over 30 Minutes Intravenous Every 24 hours 02/19/24 0616 02/21/24 1318   02/18/24 1830  ceFEPIme  (MAXIPIME ) 2 g in sodium chloride  0.9 % 100 mL IVPB        2 g 200 mL/hr over 30 Minutes Intravenous  Once 02/18/24 1816 02/18/24 2023   02/18/24 1830  doxycycline  (VIBRA -TABS) tablet 100 mg        100 mg Oral  Once 02/18/24 1816 02/18/24 2034        Medications  Scheduled Meds:  [START  ON 02/22/2024] cefadroxil   1,000 mg Oral BID   doxycycline   100 mg Oral Q12H   feeding supplement  237 mL Oral QID   fluconazole   200 mg Oral Daily   insulin  aspart  0-5 Units Subcutaneous QHS   insulin  aspart  0-6 Units Subcutaneous TID WC   nystatin   5 mL Oral QID   [START ON 02/24/2024] predniSONE   20 mg Oral Q breakfast   Followed by   NOREEN ON 02/26/2024] predniSONE   10 mg Oral Q breakfast   Followed by   NOREEN ON 02/28/2024] predniSONE   5 mg Oral Q breakfast   predniSONE   40 mg Oral Q breakfast   sodium chloride  flush  3 mL Intravenous Q12H   Continuous Infusions:   PRN Meds:.acetaminophen  **OR** acetaminophen , fentaNYL  (SUBLIMAZE ) injection, ipratropium-albuterol , oxyCODONE , prochlorperazine , senna-docusate    Subjective:   Kristy Salazar was seen and examined today. No  complaints.   Objective:   Vitals:   02/21/24 0825 02/21/24 1107 02/21/24 1618 02/21/24 1705  BP: 132/80 (!) 125/91 (!) 149/87 127/81  Pulse: 96 88 82 77  Resp: 16 (!) 28 18 18   Temp: 97.9 F (36.6 C) (!) 97.4 F (36.3 C) 97.6 F (36.4 C) 97.8 F (36.6 C)  TempSrc: Oral Oral Oral   SpO2: 94% 100% 96% 97%  Weight:      Height:        Intake/Output Summary (Last 24 hours) at 02/21/2024 1843 Last data filed at 02/21/2024 1634 Gross per 24 hour  Intake 240 ml  Output 650 ml  Net -410 ml    Filed Weights   02/18/24 2300 02/20/24 0550 02/21/24 0500  Weight: 47.5 kg 45.3 kg 46.8 kg     Exam General exam: cachetic ill appearing elderly woman not in distress.  Respiratory system: air entry fair.  Cardiovascular system: S1 & S2 heard, RRR.  Gastrointestinal system: Abdomen is soft Central nervous system: Alert and oriented to person only.  Extremities: no leg edema.  Skin: No rashes,  Psychiatry:  Mood & affect appropriate.      Data Reviewed:  I have personally reviewed following labs and imaging studies   CBC Lab Results  Component Value Date   WBC 9.3 02/21/2024   RBC 3.79 (L)  02/21/2024   HGB 11.6 (L) 02/21/2024   HCT 35.4 (L) 02/21/2024   MCV 93.4 02/21/2024   MCH 30.6 02/21/2024   PLT 26 (LL) 02/21/2024   MCHC 32.8 02/21/2024   RDW 19.4 (H) 02/21/2024   LYMPHSABS 0.1 (L) 02/18/2024   MONOABS 0.1 02/18/2024   EOSABS 0.0 02/18/2024   BASOSABS 0.0 02/18/2024     Last metabolic panel Lab Results  Component Value Date   NA 138 02/21/2024   K 4.1 02/21/2024   CL 111 02/21/2024   CO2 17 (L) 02/21/2024   BUN 53 (H) 02/21/2024   CREATININE 0.84 02/21/2024   GLUCOSE 146 (H) 02/21/2024   GFRNONAA >60 02/21/2024   GFRAA >90 05/10/2011   CALCIUM  8.3 (L) 02/21/2024   PHOS 1.7 (L) 09/15/2023   PROT 4.8 (L) 02/19/2024   ALBUMIN <1.5 (L) 02/19/2024   BILITOT 0.8 02/19/2024   ALKPHOS 108 02/19/2024   AST 30 02/19/2024   ALT 29 02/19/2024   ANIONGAP 10 02/21/2024    CBG (last 3)  Recent Labs    02/21/24 0604 02/21/24 1111 02/21/24 1617  GLUCAP 131* 208* 161*      Coagulation Profile: No results for input(s): INR, PROTIME in the last 168 hours.   Radiology Studies: No results found.      Elgie Butter M.D. [facility] 02/21/2024, 6:43 PM  Available via Epic secure chat 7am-7pm After 7 pm, please refer to night coverage provider listed on amion.

## 2024-02-21 NOTE — Progress Notes (Signed)
 Planning to see patient in infusion for nutrition follow-up. Chart reviewed. Patient is currently admitted. Appointment rescheduled for 9/9 during infusion.

## 2024-02-21 NOTE — TOC Initial Note (Signed)
 Transition of Care (TOC) - Initial/Assessment Note  Rayfield Gobble RN, BSN Inpatient Care Management Unit 4E- RN Case Manager See Treatment Team for direct phone #   Patient Details  Name: Kristy Salazar MRN: [id number] Date of Birth: [birth year]  Transition of Care [facility]) CM/SW Contact:    Gobble Rayfield Hurst, RN Phone Number: 02/21/2024, 12:58 PM  Clinical Narrative:                 Referral received for home Hospice needs. Per PC family interested in [facility] vs [facility].   CM spoke with pt/family (daughter and grand-daughter) at bedside- confirmed plan to return home w/ Hospice. Pt has DME in the home- per daughter pt will need hospital bed and home 02. Family will need to make room for hospital bed by taking her bed down.  Discussed Hospice choice- pt was active with [facility] for [facility] services- confirmed they would like to use [facility] if available in pt's area. CM has confirmed with [facility] liaison that Hospice is available- family request referral be sent to [facility].   Also discussed transportation needs- family would like to think on this and see how pt does over next few days will decide if they will transport vs EMS transport.   Call made to [facility] liaison- Lavanda- she will f/u and meet with family- as well as order DME- bed and 02.   1300- per Lavanda- she will order DME with goal for delivery tomorrow.  Per MD pt should be medically ready once Hospice in place and DME delivered.    CM will continue to follow for transition home w/ Hospice.   Expected Discharge Plan: Home w Hospice Care Barriers to Discharge: Equipment Delay (Hospice working on DME)   Patient Goals and CMS Choice Patient states their goals for this hospitalization and ongoing recovery are:: return home with Hospice CMS Medicare.gov Compare Post Acute Care list provided to:: Patient Choice offered to / list presented to : Patient, Adult Children       Expected Discharge Plan and Services   Discharge Planning Services: CM Consult Post Acute Care Choice: Hospice, Durable Medical Equipment Living arrangements for the past 2 months: Single Family Home                 DME Arranged: Oxygen, Hospital bed DME Agency:  [facility] to set up DME) Date DME Agency Contacted: 02/21/24   Representative spoke with at DME Agency: Per [facility] Arranged: Disease Management HH Agency: Other - See comment [facility]) Date [facility] Agency Contacted: 02/21/24 Time HH Agency Contacted: 1100 Representative spoke with at [facility] Agency: Abigail  Prior Living Arrangements/Services Living arrangements for the past 2 months: Single Family Home Lives with:: Self, Relatives Patient language and need for interpreter reviewed:: Yes Do you feel safe going back to the place where you live?: Yes      Need for Family Participation in Patient Care: Yes (Comment) Care giver support system in place?: Yes (comment) Current home services: DME (rolling walker, rollator, BSC, w/c) Criminal Activity/Legal Involvement Pertinent to Current Situation/Hospitalization: No - Comment as needed  Activities of Daily Living   ADL Screening (condition at time of admission) Independently performs ADLs?: No Does the patient have a NEW difficulty with bathing/dressing/toileting/self-feeding that is expected to last >3 days?: No Does the patient have a NEW difficulty with getting in/out of bed, walking, or climbing stairs that is expected to last >3 days?: No Does the  patient have a NEW difficulty with communication that is expected to last >3 days?: No Is the patient deaf or have difficulty hearing?: No Does the patient have difficulty seeing, even when wearing glasses/contacts?: No Does the patient have difficulty concentrating, remembering, or making decisions?: Yes  Permission Sought/Granted Permission sought to share information with : Facility Games developer granted to share information with : Yes, Verbal Permission Granted     Permission granted to share info w AGENCY: [facility]        Emotional Assessment Appearance:: Appears stated age Attitude/Demeanor/Rapport: Engaged Affect (typically observed): Accepting Orientation: : Oriented to Self, Oriented to Place, Oriented to  Time, Oriented to Situation Alcohol  / Substance Use: Not Applicable Psych Involvement: No (comment)  Admission diagnosis:  AKI (acute kidney injury) (HCC) [N17.9] Sepsis due to pneumonia (HCC) [J18.9, A41.9] Community acquired pneumonia, unspecified laterality [J18.9] Sepsis, due to unspecified organism, unspecified whether acute organ dysfunction present [facility]) [A41.9] Patient Active Problem List   Diagnosis Date Noted   Anemia 02/20/2024   Pressure injury of skin 02/20/2024   Sepsis (HCC) 02/18/2024   AKI (acute kidney injury) (HCC) 02/18/2024   Thrombocytopenia (HCC) 02/18/2024   Hyperglycemia 02/18/2024   Elevated troponin 02/18/2024   Metastasis to brain (HCC) 01/25/2024   Subdural hematoma (HCC) 01/25/2024   Vasogenic edema (HCC) 01/25/2024   Dysphagia 11/14/2023   DM2 (diabetes mellitus, type 2) (HCC) 11/14/2023   Odynophagia 09/09/2023   Radiation esophagitis 09/09/2023   Hypokalemia 09/09/2023   QT prolongation 09/09/2023   Hypernatremia 09/09/2023   Goals of care, counseling/discussion 08/22/2023   Port-A-Cath in place 06/15/2023   Encounter for antineoplastic chemotherapy 06/06/2023   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), [place] [address]  ([address]) [place] [address] Phone: [phone] Fax: [phone]  [facility] [address] [place] [address] Phone: [phone] Fax: [phone]  [facility] [address] [place] [address] Phone: [phone] Fax: [phone]     Social Drivers of Health (SDOH) Social History: SDOH Screenings   Food Insecurity: No Food Insecurity (02/19/2024)  Housing: Low Risk  (02/19/2024)  Transportation Needs: Unmet Transportation Needs (02/19/2024)  Utilities: Not At Risk (02/19/2024)  Depression (PHQ2-9): Low Risk  (01/18/2024)  Social Connections: Moderately Integrated (02/19/2024)  Tobacco Use: High Risk (02/19/2024)   SDOH Interventions:     Readmission Risk Interventions     No data to display

## 2024-02-21 NOTE — Plan of Care (Signed)
   Problem: Education: Goal: Knowledge of General Education information will improve Description: Including pain rating scale, medication(s)/side effects and non-pharmacologic comfort measures Outcome: Completed/Met

## 2024-02-21 NOTE — Progress Notes (Signed)
 Initial Nutrition Assessment  DOCUMENTATION CODES:   Severe malnutrition in context of chronic illness, Underweight  INTERVENTION:  Continue regular diet as ordered Ensure Plus High Protein po QID, each supplement provides 350 kcal and 20 grams of protein. Magic cup TID with meals (vanilla), each supplement provides 290 kcal and 9 grams of protein Monitor for necessity of bowel regimen High Calorie, High Protein Nutrition Therapy handout added to AVS for home reference  Continue to follow up with outpatient Dietitian at the [facility] for ongoing nutrition support  NUTRITION DIAGNOSIS:   Severe Malnutrition related to chronic illness (lung cancer) as evidenced by severe fat depletion, severe muscle depletion.  GOAL:   Patient will meet greater than or equal to 90% of their needs  MONITOR:   PO intake, Supplement acceptance, Labs, Weight trends, Skin  REASON FOR ASSESSMENT:   Consult Assessment of nutrition requirement/status (low BMI)  ASSESSMENT:   Pt admitted with SOB and cough found to have severe sepsis d/t PNA and e coli bacteremia in the setting of COPD exacerbation. PMH significant for lung cancer with brain metastasis.  Palliative following for ongoing GOC discussions.    Spoke with pt at bedside. Nutrition history provided by pt's daughter.  She reports that pt's appetite had been improving since May following esophageal dilation. She was eating multiple small meals daily. Her daughter has been providing her with 4 ensure daily as she adds these to her coffee which she consumes frequently and cream of wheat and other various meal items.   She states that pt's appetite has been declining since last Wednesday and has been eating significantly less. Pt denies having any current swallowing difficulty and feels that her thrush is much improved.   Meal completions: 8/11: 5% lunch 8/12: 75% breakfast  She has not had a BM since Friday but her daughter suspects  this is d/t her poor oral intake. Will monitor for necessity of bowel regimen.   Pt's weight had been down to 88 lbs and her daughter reports that she was able to increase her weight up to 98 lbs but is uncertain whether she has lost any weight within the last week. No weight loss noted based on review of recent weight documentation's.    Pt has no other complaints at this time. Her daughter denies any further questions and is focused on management of current illness. Would like order for Ensure QID while admitted.   Medications: diflucan , SSI 0-5 units at bedtime, SSI 0-6 units TID, nystatin  QID, prednisone , IV abx  Labs:  CBG's 127-163 x24 hours  NUTRITION - FOCUSED PHYSICAL EXAM: Flowsheet Row Most Recent Value  Orbital Region Severe depletion  Upper Arm Region Severe depletion  Thoracic and Lumbar Region Severe depletion  Buccal Region Severe depletion  Temple Region Severe depletion  Clavicle Bone Region Severe depletion  Clavicle and Acromion Bone Region Severe depletion  Scapular Bone Region Severe depletion  Dorsal Hand Severe depletion  Patellar Region Severe depletion  Anterior Thigh Region Severe depletion  Posterior Calf Region Severe depletion  Edema (RD Assessment) None  Hair Reviewed  Eyes Reviewed  Mouth Reviewed  Skin Reviewed  Nails Reviewed    Diet Order:   Diet Order             Diet regular Room service appropriate? Yes; Fluid consistency: Thin  Diet effective now                   EDUCATION NEEDS:   Education needs  have been addressed  Skin:  Skin Assessment: Skin Integrity Issues: Skin Integrity Issues:: Stage II Stage II: bilateral buttocks  Last BM:  8/8 per pt's daughter  Height:   Ht Readings from Last 1 Encounters:  02/18/24 5' 11 (1.803 m)    Weight:   Wt Readings from Last 1 Encounters:  02/21/24 46.8 kg   BMI:  Body mass index is 14.39 kg/m.  Estimated Nutritional Needs:   Kcal:  1500-1700  Protein:   75-85g  Fluid:  1.5-1.7L  Royce Maris, RDN, LDN Clinical Nutrition See AMiON for contact information.

## 2024-02-21 NOTE — Progress Notes (Signed)
Report given to [facility].

## 2024-02-21 NOTE — Progress Notes (Signed)
 Patient move to 2W34 due to ants in her room.

## 2024-02-22 DIAGNOSIS — Z515 Encounter for palliative care: Secondary | ICD-10-CM | POA: Diagnosis not present

## 2024-02-22 LAB — CBC
HCT: 35.7 % — ABNORMAL LOW (ref 36.0–46.0)
Hemoglobin: 11.9 g/dL — ABNORMAL LOW (ref 12.0–15.0)
MCH: 30.6 pg (ref 26.0–34.0)
MCHC: 33.3 g/dL (ref 30.0–36.0)
MCV: 91.8 fL (ref 80.0–100.0)
Platelets: 31 K/uL — ABNORMAL LOW (ref 150–400)
RBC: 3.89 MIL/uL (ref 3.87–5.11)
RDW: 18.9 % — ABNORMAL HIGH (ref 11.5–15.5)
WBC: 6.6 K/uL (ref 4.0–10.5)
nRBC: 0 % (ref 0.0–0.2)

## 2024-02-22 LAB — BASIC METABOLIC PANEL WITH GFR
Anion gap: 9 (ref 5–15)
BUN: 52 mg/dL — ABNORMAL HIGH (ref 8–23)
CO2: 19 mmol/L — ABNORMAL LOW (ref 22–32)
Calcium: 8.5 mg/dL — ABNORMAL LOW (ref 8.9–10.3)
Chloride: 111 mmol/L (ref 98–111)
Creatinine, Ser: 0.88 mg/dL (ref 0.44–1.00)
GFR, Estimated: >60 mL/min (ref >60)
Glucose, Bld: 135 mg/dL — ABNORMAL HIGH (ref 70–99)
Potassium: 3.9 mmol/L (ref 3.5–5.1)
Sodium: 139 mmol/L (ref 135–145)

## 2024-02-22 LAB — GLUCOSE, CAPILLARY
Glucose-Capillary: 140 mg/dL — ABNORMAL HIGH (ref 70–99)
Glucose-Capillary: 266 mg/dL — ABNORMAL HIGH (ref 70–99)
Glucose-Capillary: 265 mg/dL — ABNORMAL HIGH (ref 70–99)

## 2024-02-22 MED ORDER — POLYVINYL ALCOHOL 1.4 % OP SOLN: 1.0000 [drp] | Freq: Four times a day (QID) | OPHTHALMIC | Status: DC | PRN

## 2024-02-22 MED ORDER — DOXYCYCLINE HYCLATE 100 MG PO TABS: 100.0000 mg | ORAL_TABLET | Freq: Two times a day (BID) | ORAL | Status: AC

## 2024-02-22 MED ORDER — CEFADROXIL 500 MG PO CAPS: 1000.0000 mg | ORAL_CAPSULE | Freq: Two times a day (BID) | ORAL | Status: AC

## 2024-02-22 MED ORDER — FLUCONAZOLE 200 MG PO TABS: 200.0000 mg | ORAL_TABLET | Freq: Every day | ORAL | Status: AC

## 2024-02-22 MED ORDER — MORPHINE SULFATE (CONCENTRATE) 10 MG /0.5 ML PO SOLN: 5.0000 mg | ORAL | Status: DC | PRN

## 2024-02-22 MED ORDER — GLYCOPYRROLATE 0.2 MG/ML IJ SOLN: 0.2000 mg | INTRAMUSCULAR | Status: DC | PRN

## 2024-02-22 MED ORDER — GLYCOPYRROLATE 1 MG PO TABS: 1.0000 mg | ORAL_TABLET | ORAL | Status: DC | PRN

## 2024-02-22 MED ORDER — BIOTENE DRY MOUTH MT LIQD: 15.0000 mL | OROMUCOSAL | Status: DC | PRN

## 2024-02-22 MED ORDER — PREDNISONE 5 MG PO TABS: ORAL_TABLET | ORAL | Status: AC

## 2024-02-22 MED ORDER — MORPHINE SULFATE (PF) 2 MG/ML IV SOLN: 1.0000 mg | INTRAVENOUS | Status: DC | PRN

## 2024-02-22 NOTE — Progress Notes (Addendum)
   Palliative Medicine Inpatient Follow Up Note HPI: Kristy Salazar is an 83 y.o. female with medical history significant for COPD, HTN, arthritis, and lung cancer with brain metastases who presents with shortness of breath and cough. Palliative care has been asked to support additional goals of care conversations.   Today's Discussion 02/22/2024  *Please note that this is a verbal dictation therefore any spelling or grammatical errors are due to the Dragon Medical One system interpretation.  Chart reviewed inclusive of vital signs, progress notes, laboratory results, and diagnostic images.  I met with Kristy Salazar at bedside this morning in the company of her daughter, Kristy Salazar. We discussed her present health and the options of focusing on comfort care. We talked about transition to comfort measures in house and what that would entail inclusive of medications to control pain, dyspnea, agitation, nausea, itching, and hiccups.  We discussed stopping all uneccessary measures such as cardiac monitoring, blood draws, needle sticks, and frequent vital signs.   Patient is in agreement with the pursuit of comfort measures with the understanding that antibiotics, mIVF, and life extending medications will be stopped.   We reviewed options for placement in the company of Kristy Mustache, RN CM. The Plan at this time is to [facility] for [facility] hosp[facility].   Utilized reflective listening throughout our time together.   Questions and concerns addressed/Palliative Support Provided.   Objective Assessment: Vital Signs Vitals:   02/22/24 0458 02/22/24 0845  BP: 122/80 138/86  Pulse: 72 (!) 108  Resp: 18   Temp: 97.9 F (36.6 C)   SpO2: 100% 91%    Intake/Output Summary (Last 24 hours) at 02/22/2024 1250 Last data filed at 02/21/2024 2300 Gross per 24 hour  Intake 120 ml  Output 300 ml  Net -180 ml   Last Weight  Most recent update: 02/21/2024  6:46 AM    Weight  46.8 kg  (103 lb 2.8 oz)            Gen: Elderly African-American female chronically ill in appearance HEENT: White patches on tongue, dry mucous membranes CV: Regular rate and irregular rhythm  PULM: On room air, breathing is even and nonlabored ABD: soft/nontender  EXT: Muscle wasting noted in all 4 extremities Neuro: Alert and oriented x3   SUMMARY OF RECOMMENDATIONS   DNAR/DNI  Gold DNR form has been placed at the front of the chart completed  Advanced Directives completed  Comfort care  Medications per [facility]   Plan for [place] to transition to [facility]   Ongoing palliative care support ______________________________________________________________________________________ Kristy Becton Holden Palliative Medicine Team Team Cell Phone: [phone] Please utilize secure chat with additional questions, if there is no response within 30 minutes please call the above phone number  Billing based on MDM: High  Palliative Medicine Team providers are available by phone from 7am to 7pm daily and can be reached through the team cell phone.  Should this patient require assistance outside of these hours, please call the patient's attending physician.

## 2024-02-22 NOTE — Progress Notes (Signed)
 ACP documents notarized and uploaded. Physical copy also included in binder chart.

## 2024-02-22 NOTE — Care Management Important Message (Signed)
 Important Message  Patient Details  Name: Kristy Salazar MRN: [id number] Date of Birth: 1941/01/06   Important Message Given:  Yes - Medicare IM     [name] 02/22/2024, 3:41 PM

## 2024-02-22 NOTE — Progress Notes (Addendum)
 Transition of Care [facility]) - Inpatient Brief Assessment   Patient Details  Name: Kristy Salazar MRN: [id number] Date of Birth: 1941-05-17  Transition of Care [facility]) CM/SW Contact:    [name], RN Phone Number: 02/22/2024, 10:31 AM   Clinical Narrative: Patient admitted to the hospital with Septicemia.  Patient resides in the home with daughter and granddaughter.  Patient was active with [facility] but family would like patient transitioned to [facility] for consideration for Inpatient hospice placement.  CM with IP Care management will continue to follow the patient for possible transition to Hospice Care through [facility].  02/22/24 1342  - Patient has been accepted for Inpatient Hospice home placement with [facility] - waiting on bed availability at this time.  02/22/24 1510- Patient was accepted to [facility].  [facility] is scheduled for transport.  Bedside nursing - please call report to [facility] at # [phone] .   Transition of Care Asessment: Insurance and Status: (P) Insurance coverage has been reviewed Patient has primary care physician: (P) Yes Home environment has been reviewed: (P) from home with family Prior level of function:: (P) family assistance Prior/Current Home Services: (P) No current home services (Patient recently active with [facility]) Social Drivers of Health Review: (P) SDOH reviewed needs interventions Readmission risk has been reviewed: (P) Yes Transition of care needs: (P) transition of care needs identified, TOC will continue to follow

## 2024-02-22 NOTE — Progress Notes (Signed)
 AVS completed for discharge packet.

## 2024-02-22 NOTE — Hospital Course (Addendum)
 Brief Narrative:    83 y.o. female with medical history significant for COPD and lung cancer with brain metastases who presents with shortness of breath and cough.  She was admitted for CAP.  During the hospitalization patient was initially started on IV antibiotics thereafter transition to p.o. antibiotics to help treat pneumonia and possible underlying urinary tract infection.  Also seen by oncology team who recommended transitioning patient to comfort/hospice care.  Patient and family met with palliative care services and patient was transition to comfort care.  Planning on discharging patient to residential hospice today.  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  Lactic acid improving.  Wbc count wnl.  Currently requiring [employer] oxygen to keep sats greater than 90%.    AKI  - resolved.   Thrombocytopenia  Possible from chemotherapy last month.  Transfuse to keep platelets greater than 20,000.   Non-small cell lung cancer, squamous cell carcinoma stage IIIb - S/p chemoradiation followed by immunotherapy under the care of Dr. Sherrod; receiving [facility] for brain mets.  Seen by oncology and palliative.  Planning to discharge patient to hospice facility today   Type II DM  Resume ssi.      Elevated troponin  - Mildly elevated and flat without anginal complaints and in  setting of severe sepsis likely reflects demand ischemia  - currently denies any chest pain.    Goals of care  - Appreciate input from palliative care.  Patient is comfort care   Thrush On diflucan . Nystatin  added.            Estimated body mass index is 14.39 kg/m as calculated from the following:   Height as of this encounter: 5' 11 (1.803 m).   Weight as of this encounter: 46.8 kg.   Code Status: DNR  DVT Prophylaxis:  SCDs Start: 02/18/24 2029  Level of Care: Level of care: Med-Surg Family Communication: family at bedside.    Disposition Plan:    Discharge to hospice facility  Subjective:  At bedside no complaints. Son is also present at bedside  Examination:  General exam: Appears calm and comfortable  Respiratory system: Clear to auscultation. Respiratory effort normal. Cardiovascular system: S1 & S2 heard, RRR. No JVD, murmurs, rubs, gallops or clicks. No pedal edema. Gastrointestinal system: Abdomen is nondistended, soft and nontender. No organomegaly or masses felt. Normal bowel sounds heard. Central nervous system: Alert and oriented. No focal neurological deficits. Extremities: Symmetric 5 x 5 power. Skin: No rashes, lesions or ulcers Psychiatry: Judgement and insight appear normal. Mood & affect appropriate.

## 2024-02-22 NOTE — Progress Notes (Signed)
 PT Cancellation Note  Patient Details Name: Kristy Salazar MRN: [id number] DOB: [birth year]   Cancelled Treatment:    Reason Eval/Treat Not Completed: (P) Other (comment) (PT order discontinued by medical team, note plan now for DC with hospice services. Thank you for this consult. Please re-consult if further needs arise.)   Connell HERO [name] 02/22/2024, 12:04 PM

## 2024-02-22 NOTE — Discharge Summary (Signed)
 Physician Discharge Summary  MARIAJOSE MOW FMW:[id number] DOB: 06-Oct-1940 DOA: 02/18/2024  PCP: Benjamine Aland, MD  Admit date: 02/18/2024 Discharge date: 02/22/2024  Admitted From: Home Disposition: Hospice  Recommendations for Outpatient Follow-up:  Discharge to residential hospice : Discharge Condition: Stable CODE STATUS: DNR Diet recommendation: Regular  Brief/Interim Summary: Brief Narrative:    83 y.o. female with medical history significant for COPD and lung cancer with brain metastases who presents with shortness of breath and cough.  She was admitted for CAP.  During the hospitalization patient was initially started on IV antibiotics thereafter transition to p.o. antibiotics to help treat pneumonia and possible underlying urinary tract infection.  Also seen by oncology team who recommended transitioning patient to comfort/hospice care.  Patient and family met with palliative care services and patient was transition to comfort care.  Planning on discharging patient to residential hospice today.  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  Lactic acid improving.  Wbc count wnl.  Currently requiring  oxygen to keep sats greater than 90%.    AKI  - resolved.   Thrombocytopenia  Possible from chemotherapy last month.  Transfuse to keep platelets greater than  20,000.   Non-small cell lung cancer, squamous cell carcinoma stage IIIb - S/p chemoradiation followed by immunotherapy under the care of Dr. Sherrod; receiving [facility] for [facility] mets.  Seen by oncology and palliative.  Planning to discharge patient to hospice facility today   Type II DM  Resume ssi.      Elevated troponin  - Mildly elevated and flat without anginal complaints and in setting of severe sepsis likely reflects demand ischemia  - currently denies any chest pain.    Goals of care  - Appreciate input from palliative care.  Patient is comfort care   Thrush On diflucan . Nystatin  added.            Estimated body mass index is 14.39 kg/m as calculated from the following:   Height as of this encounter: 5' 11 (1.803 m).   Weight as of this encounter: 46.8 kg.   Code Status: DNR  DVT Prophylaxis:  SCDs Start: 02/18/24 2029  Level of Care: Level of care: Med-Surg Family Communication: family at bedside.    Disposition Plan:    Discharge to hospice facility  Subjective:  At bedside no complaints. Son is also present at bedside  Examination:  General exam: Appears calm and comfortable  Respiratory system: Clear to auscultation. Respiratory effort normal. Cardiovascular system: S1 & S2 heard, RRR. No JVD, murmurs, rubs, gallops or clicks. No pedal edema. Gastrointestinal system: Abdomen is nondistended, soft and nontender. No organomegaly or masses felt. Normal bowel sounds heard. Central nervous system: Alert and oriented. No focal neurological deficits. Extremities: Symmetric 5 x 5 power. Skin: No rashes, lesions or ulcers Psychiatry: Judgement and insight  appear normal. Mood & affect appropriate.    Discharge Diagnoses:  Principal Problem:   Sepsis (HCC) Active Problems:   COPD (chronic obstructive pulmonary disease) (HCC)   Primary squamous cell carcinoma of bronchus of right lower lobe (HCC)   DM2 (diabetes mellitus, type 2) (HCC)   Metastasis to brain  (HCC)   AKI (acute kidney injury) (HCC)   Thrombocytopenia (HCC)   Hyperglycemia   Elevated troponin   Anemia   Pressure injury of skin   Protein-calorie malnutrition, severe      Discharge Exam: Vitals:   02/22/24 0458 02/22/24 0845  BP: 122/80 138/86  Pulse: 72 (!) 108  Resp: 18   Temp: 97.9 F (36.6 C)   SpO2: 100% 91%   Vitals:   02/21/24 2015 02/21/24 2313 02/22/24 0458 02/22/24 0845  BP: (!) 143/87 122/83 122/80 138/86  Pulse: 74 78 72 (!) 108  Resp: 18 18 18    Temp: (!) 97.4 F (36.3 C) 97.7 F (36.5 C) 97.9 F (36.6 C)   TempSrc:   Oral   SpO2: 100% 98% 100% 91%  Weight:      Height:          Discharge Instructions   Allergies as of 02/22/2024       Reactions   Erythromycin Hives        Medication List     STOP taking these medications    acetaminophen  500 MG tablet Commonly known as: TYLENOL    albuterol  (2.5 MG/3ML) 0.083% nebulizer solution Commonly known as: PROVENTIL    albuterol  108 (90 Base) MCG/ACT inhaler Commonly known as: VENTOLIN  HFA   dexamethasone  4 MG tablet Commonly known as: DECADRON    Farxiga 10 MG Tabs tablet Generic drug: dapagliflozin propanediol   Fluticasone-Umeclidin-Vilant 100-62.5-25 MCG/INH Aepb   HYDROcodone -acetaminophen  5-325 MG tablet Commonly known as: NORCO/VICODIN   montelukast  10 MG tablet Commonly known as: SINGULAIR    multivitamin with minerals Tabs tablet   pantoprazole  40 MG tablet Commonly known as: PROTONIX    rosuvastatin  10 MG tablet Commonly known as: CRESTOR    tiotropium 18 MCG inhalation capsule Commonly known as: SPIRIVA        TAKE these medications    cefadroxil  500 MG capsule Commonly known as: DURICEF Take 2 capsules (1,000 mg total) by mouth 2 (two) times daily for 4 days.   doxycycline  100 MG tablet Commonly known as: VIBRA -TABS Take 1 tablet (100 mg total) by mouth every 12 (twelve) hours for 4 days.   predniSONE  5 MG tablet Commonly known as:  DELTASONE  Take 4 tablets (20 mg total) by mouth daily with breakfast for 2 days, THEN 2 tablets (10 mg total) daily with breakfast for 2 days, THEN 1 tablet (5 mg total) daily with breakfast for 2 days. Start taking on: February 24, 2024        Allergies  Allergen Reactions   Erythromycin Hives    You were cared for by a hospitalist during your hospital stay. If you have any questions about your discharge medications or the care you received while you were in the hospital after you are discharged, you can call the unit and asked to speak with the hospitalist on call if the hospitalist that took care of you is not available. Once you are discharged, your primary care physician will handle any further medical issues. Please note that no refills for any discharge medications will be authorized once you are discharged, as it is imperative that you return to your primary care physician (or establish a relationship  with a primary care physician if you do not have one) for your aftercare needs so that they can reassess your need for medications and monitor your lab values.  You were cared for by a hospitalist during your hospital stay. If you have any questions about your discharge medications or the care you received while you were in the hospital after you are discharged, you can call the unit and asked to speak with the hospitalist on call if the hospitalist that took care of you is not available. Once you are discharged, your primary care physician will handle any further medical issues. Please note that NO REFILLS for any discharge medications will be authorized once you are discharged, as it is imperative that you return to your primary care physician (or establish a relationship with a primary care physician if you do not have one) for your aftercare needs so that they can reassess your need for medications and monitor your lab values.  Please request your Prim.MD to go over all Hospital Tests and  Procedure/Radiological results at the follow up, please get all Hospital records sent to your Prim MD by signing hospital release before you go home.  Get CBC, CMP, 2 view Chest X ray checked  by Primary MD during your next visit or SNF MD in 5-7 days ( we routinely change or add medications that can affect your baseline labs and fluid status, therefore we recommend that you get the mentioned basic workup next visit with your PCP, your PCP may decide not to get them or add new tests based on their clinical decision)  On your next visit with your primary care physician please Get Medicines reviewed and adjusted.  If you experience worsening of your admission symptoms, develop shortness of breath, life threatening emergency, suicidal or homicidal thoughts you must seek medical attention immediately by calling 911 or calling your MD immediately  if symptoms less severe.  You Must read complete instructions/literature along with all the possible adverse reactions/side effects for all the Medicines you take and that have been prescribed to you. Take any new Medicines after you have completely understood and accpet all the possible adverse reactions/side effects.   Do not drive, operate heavy machinery, perform activities at heights, swimming or participation in water activities or provide baby sitting services if your were admitted for syncope or siezures until you have seen by Primary MD or a Neurologist and advised to do so again.  Do not drive when taking Pain medications.   Procedures/Studies: DG Chest Port 1 View Result Date: 02/18/2024 CLINICAL DATA:  Shortness of breath and coughing. EXAM: PORTABLE CHEST 1 VIEW COMPARISON:  01/25/2024. FINDINGS: The heart size and mediastinal contours are stable. There is atherosclerotic calcification of the aorta. A right chest port appear stable in position. Emphysematous changes are noted in the lungs. Patchy airspace disease is noted at the lung bases. No  effusion or pneumothorax is seen. The previously described right lower lobe mass is not distinctly seen on this exam. No acute osseous abnormality. IMPRESSION: 1. Patchy airspace disease at the lung bases, suspicious for pneumonia. 2. Previously described right lower lobe mass is not seen due to overlying densities. 3. Emphysema. Electronically Signed   By: Leita Birmingham M.D.   On: 02/18/2024 18:10   MR BRAIN W WO CONTRAST Result Date: 01/27/2024 CLINICAL DATA:  83 year old female with lung cancer, biparietal brain metastases on recent MRI. 3 Tesla staging. Left side subdural hematoma. EXAM: MRI HEAD WITHOUT AND WITH CONTRAST  TECHNIQUE: Multiplanar, multiecho pulse sequences of the brain and surrounding structures were obtained without and with intravenous contrast. CONTRAST:  4mL GADAVIST  GADOBUTROL  1 MMOL/ML IV SOLN COMPARISON:  Brain MRI 01/25/2024 and earlier. FINDINGS: Brain: Posterior left hemisphere parieto-occipital sulcus level rim enhancing mass is 3.5 cm long axis (series 1100, image 211). Abundant hemosiderin associated with this lesion. Smaller right inferior parietal lobe more solidly rim enhancing mass is 17 mm long axis (series 1100, image 235). Minimal hemosiderin associated. Smooth and mild dural thickening and enhancement associated with left-side SDH (see below), most apparent on coronal postcontrast. No other no other abnormal intracranial enhancement identified Posterior hemisphere vasogenic edema affecting both parietal lobes, left occipital lobe has not significantly changed. Stable mass effect on the posterolateral ventricles, more so the left. Superimposed small left side subdural hematoma is stable measuring 2-3 mm. No IVH or other acute intracranial hemorrhage. There is punctate chronic microhemorrhage just anterior to the right parietal metastasis, stable. Basilar cisterns remain normal. No ventriculomegaly. No restricted diffusion to suggest acute infarction. Cervicomedullary  junction and pituitary are within normal limits. Stable gray and white matter signal elsewhere including patchy T2 and FLAIR hyperintensity in the pons. Vascular: Major intracranial vascular flow voids are stable. Skull and upper cervical spine: Visualized bone marrow signal is within normal limits. Negative for age visible cervical spine. Sinuses/Orbits: Stable, negative. Other: Mastoids well aerated. Visible internal auditory structures appear normal. IMPRESSION: 1. Re-demonstrated biparietal metastases with stable vasogenic edema. No new metastatic disease identified. 2. Stable small left side Subdural Hematoma (2-3 mm). Mild associated dural thickening and enhancement. No midline shift. 3. No new intracranial abnormality. Electronically Signed   By: VEAR Hurst M.D.   On: 01/27/2024 04:12   CT HEAD WO CONTRAST ( ) Result Date: 01/26/2024 CLINICAL DATA:  83 year old female with abnormal head CT and brain MRI yesterday, multiple recent falls. Brain metastases suspected on MRI, with small left hemisphere subdural hematoma also. EXAM: CT HEAD WITHOUT CONTRAST TECHNIQUE: Contiguous axial images were obtained from the base of the skull through the vertex without intravenous contrast. RADIATION DOSE REDUCTION: This exam was performed according to the departmental dose-optimization program which includes automated exposure control, adjustment of the mA and/or kV according to patient size and/or use of iterative reconstruction technique. COMPARISON:  Brain MRI and head CT yesterday. FINDINGS: Brain: Mixed density but mostly hyperdense left side subdural hematoma is 4 mm on series 2, image 16 and appears stable. No new intracranial hemorrhage is identified. But there are biparietal mass lesions redemonstrated, with mild hyperdense blood products associated with the larger left side lesion as before. That mass was up to 3.5 cm by MRI. Regional vasogenic edema in the posterior hemispheres appears stable from yesterday.  Stable associated relatively mild intracranial mass effect, mass effect on the atria of the lateral ventricle and trace rightward midline shift. No ventriculomegaly. Basilar cisterns remain patent. No new cerebral edema identified. Vascular: Calcified atherosclerosis at the skull base. No suspicious intracranial vascular hyperdensity. Skull: No fracture identified. Sinuses/Orbits: Visualized paranasal sinuses and mastoids are stable and well aerated. Other: Mild left posterosuperior scalp hematoma or contusion suspected on series 3, image 61. Underlying calvarium intact. No scalp soft tissue gas. Visualized orbit soft tissues are within normal limits. IMPRESSION: 1. Stable since yesterday: - 4 mm Left Side Subdural Hematoma. Overlying left scalp hematoma but no skull fracture identified. - heterogeneous brain metastases in both parietal lobes, larger on the left. Regional cytotoxic edema. 2. Stable mild intracranial mass effect, trace rightward  midline shift. 3. No new intracranial abnormality. Electronically Signed   By: VEAR Hurst M.D.   On: 01/26/2024 07:16   CT CHEST ABDOMEN PELVIS W CONTRAST Result Date: 01/25/2024 CLINICAL DATA:  Lung cancer frequent fall EXAM: CT CHEST, ABDOMEN, AND PELVIS WITH CONTRAST TECHNIQUE: Multidetector CT imaging of the chest, abdomen and pelvis was performed following the standard protocol during bolus administration of intravenous contrast. RADIATION DOSE REDUCTION: This exam was performed according to the departmental dose-optimization program which includes automated exposure control, adjustment of the mA and/or kV according to patient size and/or use of iterative reconstruction technique. CONTRAST:  40mL OMNIPAQUE  IOHEXOL  350 MG/ML SOLN COMPARISON:  CT 11/11/2023, 09/09/2023, PET CT 05/19/2023, chest CT 08/15/2023, 07/04/2023 FINDINGS: CT CHEST FINDINGS Cardiovascular: Moderate aortic atherosclerosis. No aneurysm. Normal cardiac size. Coronary vascular calcification. No  pericardial effusion. Right-sided central venous port tip at the distal SVC. Mediastinum/Nodes: Patent trachea. No thyroid  mass. Pretracheal lymph node on series 3, image 25 measures 8 mm, previously 8 mm. Similar subcentimeter precarinal lymph node. Esophagus within normal limits. Lungs/Pleura: Advanced emphysema. Spiculated right lower lobe pulmonary mass measures about 3 x 2.6 cm on series 4, image 133, previously 33 x 28 mm. Mucoid impaction within slightly dilated right lower lobe bronchi proximal to the mass. Previously noted medial right lower lobe subpleural focus of consolidation is largely resolved, however interval multifocal bilateral heterogeneous consolidations within the bilateral upper lobes, subpleural right middle lobe and the right lower lobe. Right lower lobe lung mass is contiguous with consolidative process posteriorly that extends to the pleural surface. Left lower lobe subpleural nodularity measuring about 6 mm on series 4, image 126, previously 6 mm. Subpleural left upper lobe nodularity measures about 14 x 7 mm on series 4, image 43, previously 13 x 7 mm. Right apical scarring. No pleural effusion or pneumothorax. New left upper lobe 7 mm ground-glass nodule, series 4, image 103. Possible small spiculated 6 mm focus series 4 image 103. Musculoskeletal: No acute or suspicious osseous abnormality. CT ABDOMEN PELVIS FINDINGS Hepatobiliary: Gallstone. No focal hepatic abnormality. No biliary dilatation. Pancreas: Unremarkable. No pancreatic ductal dilatation or surrounding inflammatory changes. Spleen: Normal in size without focal abnormality. Adrenals/Urinary Tract: Adrenal glands are within normal limits. Kidneys show no hydronephrosis. Limited assessment of the collecting systems due to excreted contrast. The bladder is unremarkable aside from dilute contrast. Stomach/Bowel: Stomach nonenlarged. No dilated small bowel. No acute bowel wall thickening. Vascular/Lymphatic: Advanced calcific  aortoiliac disease. No aneurysm. No suspicious lymph nodes. Reproductive: Hysterectomy.  No adnexal mass Other: Negative for pelvic effusion or free air Musculoskeletal: Left hip replacement. AVN of the right femoral head without collapse. No acute osseous abnormality IMPRESSION: 1. Advanced emphysema. Spiculated right lower lobe pulmonary mass measures about 3 x 2.6 cm, stable to slightly diminished, previously 33 x 28 mm. Previously noted medial right lower lobe subpleural focus of consolidation is largely resolved, however interval multifocal bilateral heterogeneous consolidations within the bilateral upper lobes, subpleural right middle lobe and the right lower lobe. Few new ground-glass pulmonary nodules, most evident in the left upper lobe. Findings are favored to be infectious or inflammatory in etiology but follow-up chest CT recommended as metastatic nodules could potentially be obscured by consolidative airspace disease and for follow-up of the new small left upper lobe pulmonary nodules which are indeterminate in origin. 2. No CT evidence for metastatic disease within the abdomen or pelvis. 3. Gallstone. Aortic Atherosclerosis (ICD10-I70.0) and Emphysema (ICD10-J43.9). Electronically Signed   By: Luke Scott HERO.D.  On: 01/25/2024 20:02   MR Brain W and Wo Contrast Result Date: 01/25/2024 CLINICAL DATA:  Brain/CNS neoplasm, assess treatment response. Multiple recent falls. EXAM: MRI HEAD WITHOUT AND WITH CONTRAST TECHNIQUE: Multiplanar, multiecho pulse sequences of the brain and surrounding structures were obtained without and with intravenous contrast. CONTRAST:  5mL GADAVIST  GADOBUTROL  1 MMOL/ML IV SOLN COMPARISON:  Head CT 01/25/2024 and MRI 05/13/2023 FINDINGS: Brain: As seen on today's earlier head CT, there are bilateral parietal masses with the left being hemorrhagic. Both lesions demonstrate irregular peripheral enhancement and measure 1.6 x 1.5 cm on the right and 3.5 x 2.7 cm on the left.  There is associated moderately extensive vasogenic edema in the left greater than right posterior cerebral hemispheres with mild mass effect on the left lateral ventricle. A small subdural hematoma over the left lateral cerebral convexity measuring up to 5 mm in thickness is unchanged from today's CT. There is no significant midline shift. Small T2 hyperintensities elsewhere in the cerebral white matter and pons are nonspecific but compatible with mild chronic small vessel ischemic disease with changes in the pons having progressed from the prior MRI. No acute infarct or hydrocephalus is evident. There is mild cerebral atrophy. Vascular: Major intracranial vascular flow voids are preserved. Skull and upper cervical spine: No suspicious marrow lesion. Sinuses/Orbits: Bilateral cataract extraction. Paranasal sinuses and mastoid air cells are clear. Other: Left-sided scalp soft tissue swelling. IMPRESSION: 1. Bilateral parietal masses most concerning for metastatic disease. Moderately extensive vasogenic edema without midline shift. 2. Unchanged small left cerebral convexity subdural hematoma. 3. Mild chronic small vessel ischemic disease. Electronically Signed   By: Dasie Hamburg M.D.   On: 01/25/2024 18:53   CT Cervical Spine Wo Contrast Result Date: 01/25/2024 CLINICAL DATA:  Neck trauma (Age >= 65y) EXAM: CT CERVICAL SPINE WITHOUT CONTRAST TECHNIQUE: Multidetector CT imaging of the cervical spine was performed without intravenous contrast. Multiplanar CT image reconstructions were also generated. RADIATION DOSE REDUCTION: This exam was performed according to the departmental dose-optimization program which includes automated exposure control, adjustment of the mA and/or kV according to patient size and/or use of iterative reconstruction technique. COMPARISON:  None Available. FINDINGS: Alignment: No subluxation. Slight degenerative anterolisthesis of C3 on C4 and retrolisthesis of C5 on C6. Skull base and  vertebrae: No acute fracture. No primary bone lesion or focal pathologic process. Soft tissues and spinal canal: No prevertebral fluid or swelling. No visible canal hematoma. Disc levels: Moderate degenerative disc disease in the lower cervical spine. Advanced bilateral degenerative facet disease diffusely. Multilevel bilateral neural foraminal narrowing. Focal disc herniation centrally at C5-6 causing central spinal stenosis. Upper chest: Biapical scarring.  Emphysema. Other: None IMPRESSION: Degenerative disc and facet disease as described above. Central disc herniation at C5-6 with central spinal stenosis. No acute bony abnormality. Electronically Signed   By: Franky Crease M.D.   On: 01/25/2024 15:33   CT Head Wo Contrast Result Date: 01/25/2024 CLINICAL DATA:  Head trauma, moderate-severe EXAM: CT HEAD WITHOUT CONTRAST TECHNIQUE: Contiguous axial images were obtained from the base of the skull through the vertex without intravenous contrast. RADIATION DOSE REDUCTION: This exam was performed according to the departmental dose-optimization program which includes automated exposure control, adjustment of the mA and/or kV according to patient size and/or use of iterative reconstruction technique. COMPARISON:  None Available. FINDINGS: Brain: There appear to be bilateral posterior parietal mass lesions, measuring approximately 2.7 cm on the left with areas of hemorrhage noted, and measuring approximately 1.3 cm on the  right. Surrounding vasogenic edema in both parietal lobes. Also noted is a small left subdural hematoma measuring 4-5 mm in thickness. No significant mass effect or midline shift. No hydrocephalus. Vascular: No hyperdense vessel or unexpected calcification. Skull: No acute calvarial abnormality. Sinuses/Orbits: No acute findings Other: None IMPRESSION: Concern for bilateral posterior parietal mass lesions, left larger than right. Associated areas of hemorrhage within and surrounding the left  posterior parietal mass lesion along with 4-5 mm left subdural hematoma. Associated vasogenic edema throughout the parietal lobes bilaterally. Critical Value/emergent results were called by telephone at the time of interpretation on 01/25/2024 at 3:29 pm to provider WHITNEY PLUNKETT , who verbally acknowledged these results. Electronically Signed   By: Franky Crease M.D.   On: 01/25/2024 15:29   DG Chest 2 View Result Date: 01/25/2024 CLINICAL DATA:  Recurrent falls. EXAM: CHEST - 2 VIEW COMPARISON:  11/13/2022. FINDINGS: Bilateral lungs appear hyperexpanded and hyperlucent with coarse bronchovascular markings, in keeping with COPD. Bilateral lungs otherwise appear clear. No dense consolidation or lung collapse. Bilateral costophrenic angles are clear. Normal cardio-mediastinal silhouette. No acute osseous abnormalities. The soft tissues are within normal limits. Right-sided CT Port-A-Cath is seen with its tip overlying the cavoatrial junction region. IMPRESSION: No active cardiopulmonary disease. COPD. Electronically Signed   By: Ree Molt M.D.   On: 01/25/2024 13:22     The results of significant diagnostics from this hospitalization (including imaging, microbiology, ancillary and laboratory) are listed below for reference.     Microbiology: Recent Results (from the past 240 hours)  Resp panel by RT-PCR (RSV, Flu A&B, Covid) Anterior Nasal Swab     Status: None   Collection Time: 02/18/24  5:38 PM   Specimen: Anterior Nasal Swab  Result Value Ref Range Status   SARS Coronavirus 2 by RT PCR NEGATIVE NEGATIVE Final   Influenza A by PCR NEGATIVE NEGATIVE Final   Influenza B by PCR NEGATIVE NEGATIVE Final    Comment: (NOTE) The Xpert Xpress SARS-CoV-2/FLU/RSV plus assay is intended as an aid in the diagnosis of influenza from Nasopharyngeal swab specimens and should not be used as a sole basis for treatment. Nasal washings and aspirates are unacceptable for Xpert Xpress  SARS-CoV-2/FLU/RSV testing.  Fact Sheet for Patients: BloggerCourse.com  Fact Sheet for Healthcare Providers: SeriousBroker.it  This test is not yet approved or cleared by the [place]  FDA and has been authorized for detection and/or diagnosis of SARS-CoV-2 by FDA under an Emergency Use Authorization (EUA). This EUA will remain in effect (meaning this test can be used) for the duration of the COVID-19 declaration under Section 564(b)(1) of the Act, 21 U.S.C. section 360bbb-3(b)(1), unless the authorization is terminated or revoked.     Resp Syncytial Virus by PCR NEGATIVE NEGATIVE Final    Comment: (NOTE) Fact Sheet for Patients: BloggerCourse.com  Fact Sheet for Healthcare Providers: SeriousBroker.it  This test is not yet approved or cleared by the [place]  FDA and has been authorized for detection and/or diagnosis of SARS-CoV-2 by FDA under an Emergency Use Authorization (EUA). This EUA will remain in effect (meaning this test can be used) for the duration of the COVID-19 declaration under Section 564(b)(1) of the Act, 21 U.S.C. section 360bbb-3(b)(1), unless the authorization is terminated or revoked.  Performed at [facility], [address] [place], [place] [address]   Blood culture (routine x 2)     Status: Abnormal   Collection Time: 02/18/24  6:40 PM   Specimen: BLOOD  Result Value  Ref Range Status   Specimen Description BLOOD RIGHT ANTECUBITAL  Final   Special Requests   Final    BOTTLES DRAWN AEROBIC AND ANAEROBIC Blood Culture results may not be optimal due to an inadequate volume of blood received in culture bottles   Culture  Setup Time   Final    GRAM NEGATIVE RODS IN BOTH AEROBIC AND ANAEROBIC BOTTLES CRITICAL RESULT CALLED TO, READ BACK BY AND VERIFIED WITH: PHARMD J LEDFORD 02/19/2024 @ 0608 BY AB Performed at [facility],  [address] [place], [place] [address]    Culture ESCHERICHIA COLI (A)  Final   Report Status 02/21/2024 FINAL  Final   Organism ID, Bacteria ESCHERICHIA COLI  Final      Susceptibility   Escherichia coli - MIC*    AMPICILLIN  8 SENSITIVE Sensitive     CEFAZOLIN Value in next row Sensitive      2 SENSITIVEThis is a modified FDA-approved test that has been validated and its performance characteristics determined by the reporting laboratory.  This laboratory is certified under the Clinical Laboratory Improvement Amendments CLIA as qualified to perform high complexity clinical laboratory testing.    CEFEPIME  Value in next row Sensitive      2 SENSITIVEThis is a modified FDA-approved test that has been validated and its performance characteristics determined by the reporting laboratory.  This laboratory is certified under the Clinical Laboratory Improvement Amendments CLIA as qualified to perform high complexity clinical laboratory testing.    ERTAPENEM Value in next row Sensitive      2 SENSITIVEThis is a modified FDA-approved test that has been validated and its performance characteristics determined by the reporting laboratory.  This laboratory is certified under the Clinical Laboratory Improvement Amendments CLIA as qualified to perform high complexity clinical laboratory testing.    CEFTRIAXONE  Value in next row Sensitive      2 SENSITIVEThis is a modified FDA-approved test that has been validated and its performance characteristics determined by the reporting laboratory.  This laboratory is certified under the Clinical Laboratory Improvement Amendments CLIA as qualified to perform high complexity clinical laboratory testing.    CIPROFLOXACIN Value in next row Sensitive      2 SENSITIVEThis is a modified FDA-approved test that has been validated and its performance characteristics determined by the reporting laboratory.  This laboratory is certified under the Clinical Laboratory Improvement  Amendments CLIA as qualified to perform high complexity clinical laboratory testing.    GENTAMICIN Value in next row Sensitive      2 SENSITIVEThis is a modified FDA-approved test that has been validated and its performance characteristics determined by the reporting laboratory.  This laboratory is certified under the Clinical Laboratory Improvement Amendments CLIA as qualified to perform high complexity clinical laboratory testing.    MEROPENEM Value in next row Sensitive      2 SENSITIVEThis is a modified FDA-approved test that has been validated and its performance characteristics determined by the reporting laboratory.  This laboratory is certified under the Clinical Laboratory Improvement Amendments CLIA as qualified to perform high complexity clinical laboratory testing.    TRIMETH/SULFA Value in next row Sensitive      2 SENSITIVEThis is a modified FDA-approved test that has been validated and its performance characteristics determined by the reporting laboratory.  This laboratory is certified under the Clinical Laboratory Improvement Amendments CLIA as qualified to perform high complexity clinical laboratory testing.    AMPICILLIN /SULBACTAM Value in next row Sensitive      2  SENSITIVEThis is a modified FDA-approved test that has been validated and its performance characteristics determined by the reporting laboratory.  This laboratory is certified under the Clinical Laboratory Improvement Amendments CLIA as qualified to perform high complexity clinical laboratory testing.    PIP/TAZO Value in next row Sensitive ug/mL     <=4 SENSITIVEThis is a modified FDA-approved test that has been validated and its performance characteristics determined by the reporting laboratory.  This laboratory is certified under the Clinical Laboratory Improvement Amendments CLIA as qualified to perform high complexity clinical laboratory testing.    * ESCHERICHIA COLI  Blood Culture ID Panel (Reflexed)     Status:  Abnormal   Collection Time: 02/18/24  6:40 PM  Result Value Ref Range Status   Enterococcus faecalis NOT DETECTED NOT DETECTED Final   Enterococcus Faecium NOT DETECTED NOT DETECTED Final   Listeria monocytogenes NOT DETECTED NOT DETECTED Final   Staphylococcus species NOT DETECTED NOT DETECTED Final   Staphylococcus aureus (BCID) NOT DETECTED NOT DETECTED Final   Staphylococcus epidermidis NOT DETECTED NOT DETECTED Final   Staphylococcus lugdunensis NOT DETECTED NOT DETECTED Final   Streptococcus species NOT DETECTED NOT DETECTED Final   Streptococcus agalactiae NOT DETECTED NOT DETECTED Final   Streptococcus pneumoniae NOT DETECTED NOT DETECTED Final   Streptococcus pyogenes NOT DETECTED NOT DETECTED Final   A.calcoaceticus-baumannii NOT DETECTED NOT DETECTED Final   Bacteroides fragilis NOT DETECTED NOT DETECTED Final   Enterobacterales DETECTED (A) NOT DETECTED Final    Comment: Enterobacterales represent a large order of gram negative bacteria, not a single organism. CRITICAL RESULT CALLED TO, READ BACK BY AND VERIFIED WITH: PHARMD J LEDFORD 02/19/2024 @ 0608 BY AB    Enterobacter cloacae complex NOT DETECTED NOT DETECTED Final   Escherichia coli DETECTED (A) NOT DETECTED Final    Comment: CRITICAL RESULT CALLED TO, READ BACK BY AND VERIFIED WITH: PHARMD J LEDFORD 02/19/2024 @ 0608 BY AB    Klebsiella aerogenes NOT DETECTED NOT DETECTED Final   Klebsiella oxytoca NOT DETECTED NOT DETECTED Final   Klebsiella pneumoniae NOT DETECTED NOT DETECTED Final   Proteus species NOT DETECTED NOT DETECTED Final   Salmonella species NOT DETECTED NOT DETECTED Final   Serratia marcescens NOT DETECTED NOT DETECTED Final   Haemophilus influenzae NOT DETECTED NOT DETECTED Final   Neisseria meningitidis NOT DETECTED NOT DETECTED Final   Pseudomonas aeruginosa NOT DETECTED NOT DETECTED Final   Stenotrophomonas maltophilia NOT DETECTED NOT DETECTED Final   Candida albicans NOT DETECTED NOT DETECTED  Final   Candida auris NOT DETECTED NOT DETECTED Final   Candida glabrata NOT DETECTED NOT DETECTED Final   Candida krusei NOT DETECTED NOT DETECTED Final   Candida parapsilosis NOT DETECTED NOT DETECTED Final   Candida tropicalis NOT DETECTED NOT DETECTED Final   Cryptococcus neoformans/gattii NOT DETECTED NOT DETECTED Final   CTX-M ESBL NOT DETECTED NOT DETECTED Final   Carbapenem resistance IMP NOT DETECTED NOT DETECTED Final   Carbapenem resistance KPC NOT DETECTED NOT DETECTED Final   Carbapenem resistance NDM NOT DETECTED NOT DETECTED Final   Carbapenem resist OXA 48 LIKE NOT DETECTED NOT DETECTED Final   Carbapenem resistance VIM NOT DETECTED NOT DETECTED Final    Comment: Performed at [facility], [address] [place], [place] [address]  Blood culture (routine x 2)     Status: Abnormal   Collection Time: 02/18/24  6:59 PM   Specimen: BLOOD RIGHT FOREARM  Result Value Ref Range Status   Specimen Description BLOOD  RIGHT FOREARM  Final   Special Requests   Final    BOTTLES DRAWN AEROBIC ONLY Blood Culture results may not be optimal due to an inadequate volume of blood received in culture bottles   Culture  Setup Time   Final    GRAM NEGATIVE RODS AEROBIC BOTTLE ONLY CRITICAL VALUE NOTED.  VALUE IS CONSISTENT WITH PREVIOUSLY REPORTED AND CALLED VALUE.    Culture (A)  Final    ESCHERICHIA COLI SUSCEPTIBILITIES PERFORMED ON PREVIOUS CULTURE WITHIN THE LAST 5 DAYS. Performed at [facility], [address] [place], [place] [address]    Report Status 02/21/2024 FINAL  Final  Culture, blood (Routine X 2) w Reflex to ID Panel     Status: None (Preliminary result)   Collection Time: 02/20/24  2:30 PM   Specimen: BLOOD RIGHT ARM  Result Value Ref Range Status   Specimen Description BLOOD RIGHT ARM  Final   Special Requests   Final    BOTTLES DRAWN AEROBIC ONLY Blood Culture results may not be optimal due to an inadequate volume of blood received in culture bottles    Culture   Final    NO GROWTH 2 DAYS Performed at [facility], [address], [place], [place] [address]    Report Status PENDING  Incomplete  Culture, blood (Routine X 2) w Reflex to ID Panel     Status: None (Preliminary result)   Collection Time: 02/20/24  2:31 PM   Specimen: BLOOD  Result Value Ref Range Status   Specimen Description BLOOD RIGHT ANTECUBITAL  Final   Special Requests   Final    BOTTLES DRAWN AEROBIC ONLY Blood Culture adequate volume   Culture   Final    NO GROWTH 2 DAYS Performed at [facility], [address], [place], [place] [address]    Report Status PENDING  Incomplete     Labs: BNP (last 3 results) Recent Labs    02/18/24 1739  BNP 92.3   Basic Metabolic Panel: Recent Labs  Lab 02/18/24 1738 02/19/24 0339 02/20/24 0428 02/21/24 0334 02/22/24 0301  NA 133* 137 139 138 139  K 4.3 3.8 4.0 4.1 3.9  CL 101 109 111 111 111  CO2 16* 19* 18* 17* 19*  GLUCOSE 272* 180* 127* 146* 135*  BUN 62* 52* 44* 53* 52*  CREATININE 1.45* 1.01* 0.85 0.84 0.88  CALCIUM  8.6* 8.3* 8.1* 8.3* 8.5*   Liver Function Tests: Recent Labs  Lab 02/18/24 1738 02/19/24 0339  AST 44* 30  ALT 34 29  ALKPHOS 128* 108  BILITOT 0.6 0.8  PROT 5.5* 4.8*  ALBUMIN 1.7* <1.5*   No results for input(s): LIPASE, AMYLASE in the last 168 hours. No results for input(s): AMMONIA in the last 168 hours. CBC: Recent Labs  Lab 02/18/24 2159 02/19/24 0339 02/20/24 0428 02/21/24 0334 02/22/24 0301  WBC 3.1* 5.5 7.8 9.3 6.6  NEUTROABS 3.0  --   --   --   --   HGB 12.8 10.9* 10.8* 11.6* 11.9*  HCT 39.5 31.7* 32.4* 35.4* 35.7*  MCV 93.8 90.6 91.3 93.4 91.8  PLT 35* 36* 28* 26* 31*   Cardiac Enzymes: No results for input(s): CKTOTAL, CKMB, CKMBINDEX, TROPONINI in the last 168 hours. BNP: Invalid input(s): POCBNP CBG: Recent Labs  Lab 02/21/24 0604 02/21/24 1111 02/21/24 1617 02/21/24 2042 02/22/24 0901  GLUCAP 131* 208* 161* 167* 140*    D-Dimer No results for input(s): DDIMER in the last 72 hours.  Hgb A1c No results for input(s): HGBA1C in the last 72 hours. Lipid Profile No results for input(s): CHOL, HDL, LDLCALC, TRIG, CHOLHDL, LDLDIRECT in the last 72 hours. Thyroid  function studies No results for input(s): TSH, T4TOTAL, T3FREE, THYROIDAB in the last 72 hours.  Invalid input(s): FREET3 Anemia work up No results for input(s): VITAMINB12, FOLATE, FERRITIN, TIBC, IRON, RETICCTPCT in the last 72 hours. Urinalysis    Component Value Date/Time   COLORURINE YELLOW 01/25/2024 1619   APPEARANCEUR CLEAR 01/25/2024 1619   LABSPEC 1.011 01/25/2024 1619   PHURINE 6.0 01/25/2024 1619   GLUCOSEU >=500 (A) 01/25/2024 1619   HGBUR NEGATIVE 01/25/2024 1619   BILIRUBINUR NEGATIVE 01/25/2024 1619   KETONESUR NEGATIVE 01/25/2024 1619   PROTEINUR NEGATIVE 01/25/2024 1619   UROBILINOGEN 0.2 05/10/2011 0930   NITRITE NEGATIVE 01/25/2024 1619   LEUKOCYTESUR NEGATIVE 01/25/2024 1619   Sepsis Labs Recent Labs  Lab 02/19/24 0339 02/20/24 0428 02/21/24 0334 02/22/24 0301  WBC 5.5 7.8 9.3 6.6   Microbiology Recent Results (from the past 240 hours)  Resp panel by RT-PCR (RSV, Flu A&B, Covid) Anterior Nasal Swab     Status: None   Collection Time: 02/18/24  5:38 PM   Specimen: Anterior Nasal Swab  Result Value Ref Range Status   SARS Coronavirus 2 by RT PCR NEGATIVE NEGATIVE Final   Influenza A by PCR NEGATIVE NEGATIVE Final   Influenza B by PCR NEGATIVE NEGATIVE Final    Comment: (NOTE) The Xpert Xpress SARS-CoV-2/FLU/RSV plus assay is intended as an aid in the diagnosis of influenza from Nasopharyngeal swab specimens and should not be used as a sole basis for treatment. Nasal washings and aspirates are unacceptable for Xpert Xpress SARS-CoV-2/FLU/RSV testing.  Fact Sheet for Patients: BloggerCourse.com  Fact Sheet for Healthcare  Providers: SeriousBroker.it  This test is not yet approved or cleared by the [place]  FDA and has been authorized for detection and/or diagnosis of SARS-CoV-2 by FDA under an Emergency Use Authorization (EUA). This EUA will remain in effect (meaning this test can be used) for the duration of the COVID-19 declaration under Section 564(b)(1) of the Act, 21 U.S.C. section 360bbb-3(b)(1), unless the authorization is terminated or revoked.     Resp Syncytial Virus by PCR NEGATIVE NEGATIVE Final    Comment: (NOTE) Fact Sheet for Patients: BloggerCourse.com  Fact Sheet for Healthcare Providers: SeriousBroker.it  This test is not yet approved or cleared by the [place]  FDA and has been authorized for detection and/or diagnosis of SARS-CoV-2 by FDA under an Emergency Use Authorization (EUA). This EUA will remain in effect (meaning this test can be used) for the duration of the COVID-19 declaration under Section 564(b)(1) of the Act, 21 U.S.C. section 360bbb-3(b)(1), unless the authorization is terminated or revoked.  Performed at [facility], [address] [place], [place] [address]   Blood culture (routine x 2)     Status: Abnormal   Collection Time: 02/18/24  6:40 PM   Specimen: BLOOD  Result Value Ref Range Status   Specimen Description BLOOD RIGHT ANTECUBITAL  Final   Special Requests   Final    BOTTLES DRAWN AEROBIC AND ANAEROBIC Blood Culture results may not be optimal due to an inadequate volume of blood received in culture bottles   Culture  Setup Time   Final    GRAM NEGATIVE RODS IN BOTH AEROBIC AND ANAEROBIC BOTTLES CRITICAL RESULT CALLED TO, READ BACK BY AND VERIFIED WITH: PHARMD J LEDFORD 02/19/2024 @ 0608 BY AB Performed at  [facility], [address], [place], [place] [address]    Culture ESCHERICHIA COLI (A)  Final   Report Status 02/21/2024 FINAL  Final   Organism  ID, Bacteria ESCHERICHIA COLI  Final      Susceptibility   Escherichia coli - MIC*    AMPICILLIN  8 SENSITIVE Sensitive     CEFAZOLIN Value in next row Sensitive      2 SENSITIVEThis is a modified FDA-approved test that has been validated and its performance characteristics determined by the reporting laboratory.  This laboratory is certified under the Clinical Laboratory Improvement Amendments CLIA as qualified to perform high complexity clinical laboratory testing.    CEFEPIME  Value in next row Sensitive      2 SENSITIVEThis is a modified FDA-approved test that has been validated and its performance characteristics determined by the reporting laboratory.  This laboratory is certified under the Clinical Laboratory Improvement Amendments CLIA as qualified to perform high complexity clinical laboratory testing.    ERTAPENEM Value in next row Sensitive      2 SENSITIVEThis is a modified FDA-approved test that has been validated and its performance characteristics determined by the reporting laboratory.  This laboratory is certified under the Clinical Laboratory Improvement Amendments CLIA as qualified to perform high complexity clinical laboratory testing.    CEFTRIAXONE  Value in next row Sensitive      2 SENSITIVEThis is a modified FDA-approved test that has been validated and its performance characteristics determined by the reporting laboratory.  This laboratory is certified under the Clinical Laboratory Improvement Amendments CLIA as qualified to perform high complexity clinical laboratory testing.    CIPROFLOXACIN Value in next row Sensitive      2 SENSITIVEThis is a modified FDA-approved test that has been validated and its performance characteristics determined by the reporting laboratory.  This laboratory is certified under the Clinical Laboratory Improvement Amendments CLIA as qualified to perform high complexity clinical laboratory testing.    GENTAMICIN Value in next row Sensitive      2  SENSITIVEThis is a modified FDA-approved test that has been validated and its performance characteristics determined by the reporting laboratory.  This laboratory is certified under the Clinical Laboratory Improvement Amendments CLIA as qualified to perform high complexity clinical laboratory testing.    MEROPENEM Value in next row Sensitive      2 SENSITIVEThis is a modified FDA-approved test that has been validated and its performance characteristics determined by the reporting laboratory.  This laboratory is certified under the Clinical Laboratory Improvement Amendments CLIA as qualified to perform high complexity clinical laboratory testing.    TRIMETH/SULFA Value in next row Sensitive      2 SENSITIVEThis is a modified FDA-approved test that has been validated and its performance characteristics determined by the reporting laboratory.  This laboratory is certified under the Clinical Laboratory Improvement Amendments CLIA as qualified to perform high complexity clinical laboratory testing.    AMPICILLIN /SULBACTAM Value in next row Sensitive      2 SENSITIVEThis is a modified FDA-approved test that has been validated and its performance characteristics determined by the reporting laboratory.  This laboratory is certified under the Clinical Laboratory Improvement Amendments CLIA as qualified to perform high complexity clinical laboratory testing.    PIP/TAZO Value in next row Sensitive ug/mL     <=4 SENSITIVEThis is a modified FDA-approved test that has been validated and its performance characteristics determined by the reporting laboratory.  This laboratory is certified under the Clinical Laboratory Improvement Amendments CLIA  as qualified to perform high complexity clinical laboratory testing.    * ESCHERICHIA COLI  Blood Culture ID Panel (Reflexed)     Status: Abnormal   Collection Time: 02/18/24  6:40 PM  Result Value Ref Range Status   Enterococcus faecalis NOT DETECTED NOT DETECTED Final    Enterococcus Faecium NOT DETECTED NOT DETECTED Final   Listeria monocytogenes NOT DETECTED NOT DETECTED Final   Staphylococcus species NOT DETECTED NOT DETECTED Final   Staphylococcus aureus (BCID) NOT DETECTED NOT DETECTED Final   Staphylococcus epidermidis NOT DETECTED NOT DETECTED Final   Staphylococcus lugdunensis NOT DETECTED NOT DETECTED Final   Streptococcus species NOT DETECTED NOT DETECTED Final   Streptococcus agalactiae NOT DETECTED NOT DETECTED Final   Streptococcus pneumoniae NOT DETECTED NOT DETECTED Final   Streptococcus pyogenes NOT DETECTED NOT DETECTED Final   A.calcoaceticus-baumannii NOT DETECTED NOT DETECTED Final   Bacteroides fragilis NOT DETECTED NOT DETECTED Final   Enterobacterales DETECTED (A) NOT DETECTED Final    Comment: Enterobacterales represent a large order of gram negative bacteria, not a single organism. CRITICAL RESULT CALLED TO, READ BACK BY AND VERIFIED WITH: PHARMD J LEDFORD 02/19/2024 @ 0608 BY AB    Enterobacter cloacae complex NOT DETECTED NOT DETECTED Final   Escherichia coli DETECTED (A) NOT DETECTED Final    Comment: CRITICAL RESULT CALLED TO, READ BACK BY AND VERIFIED WITH: PHARMD J LEDFORD 02/19/2024 @ 0608 BY AB    Klebsiella aerogenes NOT DETECTED NOT DETECTED Final   Klebsiella oxytoca NOT DETECTED NOT DETECTED Final   Klebsiella pneumoniae NOT DETECTED NOT DETECTED Final   Proteus species NOT DETECTED NOT DETECTED Final   Salmonella species NOT DETECTED NOT DETECTED Final   Serratia marcescens NOT DETECTED NOT DETECTED Final   Haemophilus influenzae NOT DETECTED NOT DETECTED Final   Neisseria meningitidis NOT DETECTED NOT DETECTED Final   Pseudomonas aeruginosa NOT DETECTED NOT DETECTED Final   Stenotrophomonas maltophilia NOT DETECTED NOT DETECTED Final   Candida albicans NOT DETECTED NOT DETECTED Final   Candida auris NOT DETECTED NOT DETECTED Final   Candida glabrata NOT DETECTED NOT DETECTED Final   Candida krusei NOT DETECTED  NOT DETECTED Final   Candida parapsilosis NOT DETECTED NOT DETECTED Final   Candida tropicalis NOT DETECTED NOT DETECTED Final   Cryptococcus neoformans/gattii NOT DETECTED NOT DETECTED Final   CTX-M ESBL NOT DETECTED NOT DETECTED Final   Carbapenem resistance IMP NOT DETECTED NOT DETECTED Final   Carbapenem resistance KPC NOT DETECTED NOT DETECTED Final   Carbapenem resistance NDM NOT DETECTED NOT DETECTED Final   Carbapenem resist OXA 48 LIKE NOT DETECTED NOT DETECTED Final   Carbapenem resistance VIM NOT DETECTED NOT DETECTED Final    Comment: Performed at [facility], [address], [place], [place] [address]  Blood culture (routine x 2)     Status: Abnormal   Collection Time: 02/18/24  6:59 PM   Specimen: BLOOD RIGHT FOREARM  Result Value Ref Range Status   Specimen Description BLOOD RIGHT FOREARM  Final   Special Requests   Final    BOTTLES DRAWN AEROBIC ONLY Blood Culture results may not be optimal due to an inadequate volume of blood received in culture bottles   Culture  Setup Time   Final    GRAM NEGATIVE RODS AEROBIC BOTTLE ONLY CRITICAL VALUE NOTED.  VALUE IS CONSISTENT WITH PREVIOUSLY REPORTED AND CALLED VALUE.    Culture (A)  Final    ESCHERICHIA COLI SUSCEPTIBILITIES PERFORMED ON PREVIOUS CULTURE WITHIN THE  LAST 5 DAYS. Performed at [facility], [address] [place], [place] [address]    Report Status 02/21/2024 FINAL  Final  Culture, blood (Routine X 2) w Reflex to ID Panel     Status: None (Preliminary result)   Collection Time: 02/20/24  2:30 PM   Specimen: BLOOD RIGHT ARM  Result Value Ref Range Status   Specimen Description BLOOD RIGHT ARM  Final   Special Requests   Final    BOTTLES DRAWN AEROBIC ONLY Blood Culture results may not be optimal due to an inadequate volume of blood received in culture bottles   Culture   Final    NO GROWTH 2 DAYS Performed at [facility] [facility], [address] [place], [place] [address]    Report Status  PENDING  Incomplete  Culture, blood (Routine X 2) w Reflex to ID Panel     Status: None (Preliminary result)   Collection Time: 02/20/24  2:31 PM   Specimen: BLOOD  Result Value Ref Range Status   Specimen Description BLOOD RIGHT ANTECUBITAL  Final   Special Requests   Final    BOTTLES DRAWN AEROBIC ONLY Blood Culture adequate volume   Culture   Final    NO GROWTH 2 DAYS Performed at [facility], [address] [place], [place] [address]    Report Status PENDING  Incomplete     Time coordinating discharge:  I have spent 35 minutes face to face with the patient and on the ward discussing the patients care, assessment, plan and disposition with other care givers. >50% of the time was devoted counseling the patient about the risks and benefits of treatment/Discharge disposition and coordinating care.   SIGNED:   Burgess JAYSON Dare, MD  [facility] 02/22/2024, 12:21 PM   If 7PM-7AM, please contact night-coverage

## 2024-02-22 NOTE — Plan of Care (Signed)
  Problem: Education: Goal: Ability to describe self-care measures that may prevent or decrease complications (Diabetes Survival Skills Education) will improve 02/22/2024 1601 by Rosalva Charlies RAMAN, RN Outcome: Adequate for Discharge 02/22/2024 1559 by Rosalva Charlies RAMAN, RN Outcome: Adequate for Discharge Goal: Individualized Educational Video(s) 02/22/2024 1601 by Rosalva Charlies RAMAN, RN Outcome: Adequate for Discharge 02/22/2024 1559 by Rosalva Charlies RAMAN, RN Outcome: Adequate for Discharge

## 2024-02-22 NOTE — Progress Notes (Signed)
 Patient arrived on unit. Assessment complete with charge nurse. Family at bedside, patient stable and call bell within reach.

## 2024-02-24 ENCOUNTER — Other Ambulatory Visit (HOSPITAL_COMMUNITY): Payer: Self-pay

## 2024-02-25 LAB — CULTURE, BLOOD (ROUTINE X 2)
Culture: NO GROWTH
Culture: NO GROWTH
Special Requests: ADEQUATE

## 2024-03-16 DIAGNOSIS — J449 Chronic obstructive pulmonary disease, unspecified: Secondary | ICD-10-CM | POA: Diagnosis not present

## 2024-03-16 NOTE — Progress Notes (Addendum)
  Radiation Oncology         [phone]) [phone] ________________________________  Name: Kristy Salazar MRN: [id number]  Date of Service: 03/19/2024  DOB: Jun 13, 1941  Post Treatment Telephone Note  Diagnosis:  Progressive metastatic stage IIIB, cT4N2M0, NSCLC, squamous cell carcinoma of the right lower lobe now involving bilateral parietal lobes      The patient was not available for call today. Called patients daughter and she stated that her mom passed last night.

## 2024-03-19 ENCOUNTER — Ambulatory Visit
Admission: RE | Admit: 2024-03-19 | Source: Ambulatory Visit | Attending: Radiation Oncology | Admitting: Radiation Oncology

## 2024-03-20 ENCOUNTER — Encounter: Admitting: Dietician

## 2024-03-20 ENCOUNTER — Ambulatory Visit: Admitting: Physician Assistant

## 2024-03-20 ENCOUNTER — Other Ambulatory Visit

## 2024-03-20 ENCOUNTER — Ambulatory Visit

## 2024-04-11 NOTE — Progress Notes (Deleted)
[facility] OFFICE PROGRESS NOTE  Benjamine Aland, MD [address] [place] [address]  DIAGNOSIS: Metastatic lung cancer, initially diagnosed as stage IIIb (T4, N2, M0) non-small cell lung cancer, squamous cell carcinoma presented with large right lower lobe lung mass in addition to other right upper lobe pulmonary nodule and right hilar and mediastinal lymphadenopathy in September 2024. She was found to have metastatic disease to the brain in July 2025.   PRIOR THERAPY: 1)  A course of concurrent chemoradiation with weekly carboplatin  for AUC of 2 and paclitaxel  45 Mg/M2. First dose June 06, 2023. Status post 6 cycles  2) SRS to the metastatic brain lesions. Completed on 01/30/24.   CURRENT THERAPY: Consolidation with Imfinzi  1500 mg IV every 4 weeks. First dose expected on 08/29/2023. Status post 6 cycles of treatment.   INTERVAL HISTORY: Kristy Salazar 83 y.o. female returns for *** regular *** visit for followup of ***   MEDICAL HISTORY: Past Medical History:  Diagnosis Date   Arthritis    Aspiration pneumonia (HCC) 09/09/2023   Asthma    COPD (chronic obstructive pulmonary disease) (HCC)    Diabetes mellitus    patient denies   Hypertension    Non-small cell lung cancer (HCC)    Radiation esophagitis    Seasonal allergies     ALLERGIES:  is allergic to erythromycin.  MEDICATIONS:  No current outpatient medications on file.   No current facility-administered medications for this visit.    SURGICAL HISTORY:  Past Surgical History:  Procedure Laterality Date   ABDOMINAL HYSTERECTOMY     APPENDECTOMY     BACK SURGERY     BRONCHIAL BIOPSY  05/17/2023   Procedure: BRONCHIAL BIOPSIES;  Surgeon: Shelah Lamar RAMAN, MD;  Location: [facility] ENDOSCOPY;  Service: Pulmonary;;   BRONCHIAL BRUSHINGS  05/17/2023   Procedure: BRONCHIAL BRUSHINGS;  Surgeon: Shelah Lamar RAMAN, MD;  Location: [facility] ENDOSCOPY;  Service: Pulmonary;;   BRONCHIAL NEEDLE ASPIRATION BIOPSY   05/17/2023   Procedure: BRONCHIAL NEEDLE ASPIRATION BIOPSIES;  Surgeon: Shelah Lamar RAMAN, MD;  Location: [facility] ENDOSCOPY;  Service: Pulmonary;;   ESOPHAGOGASTRODUODENOSCOPY N/A 11/14/2023   Procedure: EGD (ESOPHAGOGASTRODUODENOSCOPY);  Surgeon: Rosalie Kitchens, MD;  Location: [facility] ENDOSCOPY;  Service: Gastroenterology;  Laterality: N/A;   ESOPHAGOGASTRODUODENOSCOPY (EGD) WITH PROPOFOL  N/A 09/13/2023   Procedure: ESOPHAGOGASTRODUODENOSCOPY (EGD) WITH PROPOFOL ;  Surgeon: Kriss Estefana DEL, DO;  Location: [facility] ENDOSCOPY;  Service: Gastroenterology;  Laterality: N/A;   IR IMAGING GUIDED PORT INSERTION  06/03/2023   JOINT REPLACEMENT     TOTAL HIP ARTHROPLASTY     TOTAL KNEE ARTHROPLASTY     VIDEO BRONCHOSCOPY WITH ENDOBRONCHIAL ULTRASOUND Right 05/17/2023   Procedure: VIDEO BRONCHOSCOPY WITH ENDOBRONCHIAL ULTRASOUND;  Surgeon: Shelah Lamar RAMAN, MD;  Location: [facility] ENDOSCOPY;  Service: Pulmonary;  Laterality: Right;    REVIEW OF SYSTEMS:   Review of Systems  Constitutional: Negative for appetite change, chills, fatigue, fever and unexpected weight change.  HENT:   Negative for mouth sores, nosebleeds, sore throat and trouble swallowing.   Eyes: Negative for eye problems and icterus.  Respiratory: Negative for cough, hemoptysis, shortness of breath and wheezing.   Cardiovascular: Negative for chest pain and leg swelling.  Gastrointestinal: Negative for abdominal pain, constipation, diarrhea, nausea and vomiting.  Genitourinary: Negative for bladder incontinence, difficulty urinating, dysuria, frequency and hematuria.   Musculoskeletal: Negative for back pain, gait problem, neck pain and neck stiffness.  Skin: Negative for itching and rash.  Neurological: Negative for dizziness, extremity weakness, gait  problem, headaches, light-headedness and seizures.  Hematological: Negative for adenopathy. Does not bruise/bleed easily.  Psychiatric/Behavioral: Negative for confusion, depression and sleep disturbance. The patient  is not nervous/anxious.     PHYSICAL EXAMINATION:  There were no vitals taken for this visit.  ECOG PERFORMANCE STATUS: {CHL ONC ECOG [id number]  Physical Exam  Constitutional: Oriented to person, place, and time and well-developed, well-nourished, and in no distress. No distress.  HENT:  Head: Normocephalic and atraumatic.  Mouth/Throat: Oropharynx is clear and moist. No oropharyngeal exudate.  Eyes: Conjunctivae are normal. Right eye exhibits no discharge. Left eye exhibits no discharge. No scleral icterus.  Neck: Normal range of motion. Neck supple.  Cardiovascular: Normal rate, regular rhythm, normal heart sounds and intact distal pulses.   Pulmonary/Chest: Effort normal and breath sounds normal. No respiratory distress. No wheezes. No rales.  Abdominal: Soft. Bowel sounds are normal. Exhibits no distension and no mass. There is no tenderness.  Musculoskeletal: Normal range of motion. Exhibits no edema.  Lymphadenopathy:    No cervical adenopathy.  Neurological: Alert and oriented to person, place, and time. Exhibits normal muscle tone. Gait normal. Coordination normal.  Skin: Skin is warm and dry. No rash noted. Not diaphoretic. No erythema. No pallor.  Psychiatric: Mood, memory and judgment normal.  Vitals reviewed.  LABORATORY DATA: Lab Results  Component Value Date   WBC 6.6 02/22/2024   HGB 11.9 (L) 02/22/2024   HCT 35.7 (L) 02/22/2024   MCV 91.8 02/22/2024   PLT 31 (L) 02/22/2024      Chemistry      Component Value Date/Time   NA 139 02/22/2024 0301   K 3.9 02/22/2024 0301   CL 111 02/22/2024 0301   CO2 19 (L) 02/22/2024 0301   BUN 52 (H) 02/22/2024 0301   CREATININE 0.88 02/22/2024 0301   CREATININE 0.60 02/07/2024 1015      Component Value Date/Time   CALCIUM  8.5 (L) 02/22/2024 0301   ALKPHOS 108 02/19/2024 0339   AST 30 02/19/2024 0339   AST 20 02/07/2024 1015   ALT 29 02/19/2024 0339   ALT 46 (H) 02/07/2024 1015   BILITOT 0.8 02/19/2024 0339    BILITOT 0.3 02/07/2024 1015       RADIOGRAPHIC STUDIES:  DG Chest Port 1 View Result Date: 02/18/2024 CLINICAL DATA:  Shortness of breath and coughing. EXAM: PORTABLE CHEST 1 VIEW COMPARISON:  01/25/2024. FINDINGS: The heart size and mediastinal contours are stable. There is atherosclerotic calcification of the aorta. A right chest port appear stable in position. Emphysematous changes are noted in the lungs. Patchy airspace disease is noted at the lung bases. No effusion or pneumothorax is seen. The previously described right lower lobe mass is not distinctly seen on this exam. No acute osseous abnormality. IMPRESSION: 1. Patchy airspace disease at the lung bases, suspicious for pneumonia. 2. Previously described right lower lobe mass is not seen due to overlying densities. 3. Emphysema. Electronically Signed   By: Leita Birmingham M.D.   On: 02/18/2024 18:10     ASSESSMENT/PLAN:  No problem-specific Assessment & Plan notes found for this encounter.   No orders of the defined types were placed in this encounter.    I spent {CHL ONC TIME VISIT - DTPQU:[id number]} counseling the patient face to face. The total time spent in the appointment was {CHL ONC TIME VISIT - DTPQU:[id number]}.  Marcio Hoque L Rubylee Zamarripa, PA-C 03/12/2024

## 2024-04-11 DEATH — deceased

## 2024-05-21 ENCOUNTER — Ambulatory Visit: Admitting: Radiation Oncology

## 2024-05-21 ENCOUNTER — Encounter
